# Patient Record
Sex: Female | Born: 1970 | Race: Black or African American | Hispanic: No | Marital: Single | Smoking: Never smoker
Health system: Southern US, Community
[De-identification: ages and names within clinical notes are randomized; demographics above are authoritative.]

## PROBLEM LIST (undated history)

## (undated) DIAGNOSIS — R002 Palpitations: Secondary | ICD-10-CM

## (undated) DIAGNOSIS — D649 Anemia, unspecified: Secondary | ICD-10-CM

## (undated) DIAGNOSIS — E119 Type 2 diabetes mellitus without complications: Secondary | ICD-10-CM

## (undated) DIAGNOSIS — R51 Headache: Secondary | ICD-10-CM

## (undated) DIAGNOSIS — E669 Obesity, unspecified: Secondary | ICD-10-CM

## (undated) DIAGNOSIS — G8929 Other chronic pain: Secondary | ICD-10-CM

## (undated) DIAGNOSIS — F32A Depression, unspecified: Secondary | ICD-10-CM

## (undated) DIAGNOSIS — E010 Iodine-deficiency related diffuse (endemic) goiter: Secondary | ICD-10-CM

## (undated) DIAGNOSIS — F419 Anxiety disorder, unspecified: Secondary | ICD-10-CM

## (undated) DIAGNOSIS — D573 Sickle-cell trait: Secondary | ICD-10-CM

## (undated) DIAGNOSIS — I10 Essential (primary) hypertension: Secondary | ICD-10-CM

## (undated) DIAGNOSIS — M549 Dorsalgia, unspecified: Secondary | ICD-10-CM

## (undated) HISTORY — DX: Depression, unspecified: F32.A

## (undated) HISTORY — DX: Palpitations: R00.2

## (undated) HISTORY — DX: Type 2 diabetes mellitus without complications: E11.9

## (undated) HISTORY — DX: Iodine-deficiency related diffuse (endemic) goiter: E01.0

## (undated) HISTORY — DX: Anxiety disorder, unspecified: F41.9

## (undated) HISTORY — DX: Essential (primary) hypertension: I10

## (undated) HISTORY — DX: Other chronic pain: M54.9

## (undated) HISTORY — DX: Headache: R51

## (undated) HISTORY — DX: Other chronic pain: G89.29

## (undated) HISTORY — DX: Obesity, unspecified: E66.9

## (undated) HISTORY — PX: OTHER SURGICAL HISTORY: SHX169

---

## 2001-08-07 ENCOUNTER — Other Ambulatory Visit: Admission: RE | Admit: 2001-08-07 | Discharge: 2001-08-07 | Payer: Self-pay | Admitting: Obstetrics and Gynecology

## 2002-02-09 ENCOUNTER — Ambulatory Visit (HOSPITAL_COMMUNITY): Admission: AD | Admit: 2002-02-09 | Discharge: 2002-02-09 | Payer: Self-pay | Admitting: Obstetrics and Gynecology

## 2002-04-17 ENCOUNTER — Ambulatory Visit (HOSPITAL_COMMUNITY): Admission: AD | Admit: 2002-04-17 | Discharge: 2002-04-17 | Payer: Self-pay | Admitting: Obstetrics and Gynecology

## 2002-04-21 ENCOUNTER — Ambulatory Visit (HOSPITAL_COMMUNITY): Admission: AD | Admit: 2002-04-21 | Discharge: 2002-04-21 | Payer: Self-pay | Admitting: Obstetrics and Gynecology

## 2002-04-24 ENCOUNTER — Ambulatory Visit (HOSPITAL_COMMUNITY): Admission: AD | Admit: 2002-04-24 | Discharge: 2002-04-24 | Payer: Self-pay | Admitting: Obstetrics and Gynecology

## 2002-04-28 ENCOUNTER — Ambulatory Visit (HOSPITAL_COMMUNITY): Admission: RE | Admit: 2002-04-28 | Discharge: 2002-04-28 | Payer: Self-pay | Admitting: Obstetrics and Gynecology

## 2002-05-01 ENCOUNTER — Ambulatory Visit (HOSPITAL_COMMUNITY): Admission: AD | Admit: 2002-05-01 | Discharge: 2002-05-01 | Payer: Self-pay | Admitting: Obstetrics and Gynecology

## 2002-05-04 ENCOUNTER — Inpatient Hospital Stay (HOSPITAL_COMMUNITY): Admission: AD | Admit: 2002-05-04 | Discharge: 2002-05-08 | Payer: Self-pay | Admitting: Obstetrics and Gynecology

## 2004-11-16 ENCOUNTER — Ambulatory Visit: Payer: Self-pay | Admitting: Family Medicine

## 2004-11-17 ENCOUNTER — Ambulatory Visit (HOSPITAL_COMMUNITY): Admission: RE | Admit: 2004-11-17 | Discharge: 2004-11-17 | Payer: Self-pay | Admitting: Family Medicine

## 2005-01-01 ENCOUNTER — Ambulatory Visit: Payer: Self-pay | Admitting: Family Medicine

## 2005-08-20 ENCOUNTER — Ambulatory Visit: Payer: Self-pay | Admitting: Family Medicine

## 2005-08-20 ENCOUNTER — Encounter (INDEPENDENT_AMBULATORY_CARE_PROVIDER_SITE_OTHER): Payer: Self-pay | Admitting: *Deleted

## 2005-08-27 ENCOUNTER — Ambulatory Visit: Payer: Self-pay | Admitting: Family Medicine

## 2006-08-05 ENCOUNTER — Ambulatory Visit (HOSPITAL_COMMUNITY): Admission: AD | Admit: 2006-08-05 | Discharge: 2006-08-05 | Payer: Self-pay | Admitting: Obstetrics and Gynecology

## 2006-08-14 ENCOUNTER — Inpatient Hospital Stay (HOSPITAL_COMMUNITY): Admission: AD | Admit: 2006-08-14 | Discharge: 2006-08-17 | Payer: Self-pay | Admitting: Obstetrics and Gynecology

## 2006-10-22 LAB — HM MAMMOGRAPHY: HM Mammogram: NORMAL

## 2007-01-20 ENCOUNTER — Ambulatory Visit: Payer: Self-pay | Admitting: Family Medicine

## 2007-01-23 ENCOUNTER — Encounter: Payer: Self-pay | Admitting: Family Medicine

## 2007-07-31 ENCOUNTER — Encounter: Payer: Self-pay | Admitting: Family Medicine

## 2007-12-24 ENCOUNTER — Encounter (INDEPENDENT_AMBULATORY_CARE_PROVIDER_SITE_OTHER): Payer: Self-pay | Admitting: *Deleted

## 2007-12-24 DIAGNOSIS — M549 Dorsalgia, unspecified: Secondary | ICD-10-CM

## 2007-12-24 DIAGNOSIS — E669 Obesity, unspecified: Secondary | ICD-10-CM | POA: Insufficient documentation

## 2007-12-24 DIAGNOSIS — I10 Essential (primary) hypertension: Secondary | ICD-10-CM | POA: Insufficient documentation

## 2007-12-24 HISTORY — DX: Dorsalgia, unspecified: M54.9

## 2010-05-18 ENCOUNTER — Encounter: Payer: Self-pay | Admitting: Family Medicine

## 2010-05-18 ENCOUNTER — Emergency Department (HOSPITAL_COMMUNITY): Admission: EM | Admit: 2010-05-18 | Discharge: 2010-05-18 | Payer: Self-pay | Admitting: Emergency Medicine

## 2010-05-30 DIAGNOSIS — R0789 Other chest pain: Secondary | ICD-10-CM | POA: Insufficient documentation

## 2010-06-01 ENCOUNTER — Ambulatory Visit: Payer: Self-pay | Admitting: Family Medicine

## 2010-06-21 ENCOUNTER — Telehealth: Payer: Self-pay | Admitting: Family Medicine

## 2010-07-06 ENCOUNTER — Telehealth: Payer: Self-pay | Admitting: Family Medicine

## 2010-07-25 ENCOUNTER — Telehealth: Payer: Self-pay | Admitting: Family Medicine

## 2010-07-27 ENCOUNTER — Emergency Department (HOSPITAL_COMMUNITY)
Admission: EM | Admit: 2010-07-27 | Discharge: 2010-07-27 | Payer: Self-pay | Source: Home / Self Care | Admitting: Emergency Medicine

## 2010-07-27 LAB — CONVERTED CEMR LAB
Calcium: 9.3 mg/dL
Chloride: 102 meq/L
Potassium: 3 meq/L
WBC: 6.8 10*3/uL

## 2010-08-04 ENCOUNTER — Ambulatory Visit
Admission: RE | Admit: 2010-08-04 | Discharge: 2010-08-04 | Payer: Self-pay | Source: Home / Self Care | Attending: Cardiology | Admitting: Cardiology

## 2010-08-04 ENCOUNTER — Encounter (INDEPENDENT_AMBULATORY_CARE_PROVIDER_SITE_OTHER): Payer: Self-pay | Admitting: *Deleted

## 2010-08-04 DIAGNOSIS — R002 Palpitations: Secondary | ICD-10-CM | POA: Insufficient documentation

## 2010-08-05 ENCOUNTER — Encounter (INDEPENDENT_AMBULATORY_CARE_PROVIDER_SITE_OTHER): Payer: Self-pay | Admitting: *Deleted

## 2010-08-05 ENCOUNTER — Encounter: Payer: Self-pay | Admitting: Cardiology

## 2010-08-05 LAB — CONVERTED CEMR LAB: TSH: 0.766 microintl units/mL

## 2010-08-07 ENCOUNTER — Encounter: Payer: Self-pay | Admitting: Cardiology

## 2010-08-09 ENCOUNTER — Ambulatory Visit (HOSPITAL_COMMUNITY)
Admission: RE | Admit: 2010-08-09 | Discharge: 2010-08-09 | Payer: Self-pay | Source: Home / Self Care | Attending: Cardiology | Admitting: Cardiology

## 2010-08-15 ENCOUNTER — Ambulatory Visit (HOSPITAL_COMMUNITY)
Admission: RE | Admit: 2010-08-15 | Discharge: 2010-08-15 | Payer: Self-pay | Source: Home / Self Care | Attending: Cardiology | Admitting: Cardiology

## 2010-08-29 DIAGNOSIS — R079 Chest pain, unspecified: Secondary | ICD-10-CM

## 2010-08-29 NOTE — Letter (Signed)
Summary: rpc chart  rpc chart   Imported By: Curtis Sites 02/23/2010 14:43:40  _____________________________________________________________________  External Attachment:    Type:   Image     Comment:   External Document

## 2010-08-29 NOTE — Progress Notes (Signed)
Summary: please advise  Phone Note Call from Patient   Summary of Call: patient called in wanted to know what she could take along with her B/P medication for sinus like symptoms, sore throat drainage in throat, stopped up in nose.  please advise at [phone] Initial call taken by: Curtis Sites,  June 21, 2010 10:35 AM  Follow-up for Phone Call        ocean spray, claritin, or zyrtec , or benadryl, all are oTC Follow-up by: Syliva Overman MD,  June 21, 2010 12:54 PM  Additional Follow-up for Phone Call Additional follow up Details #1::        patient aware Additional Follow-up by: Adella Hare LPN,  June 21, 2010 2:46 PM

## 2010-08-29 NOTE — Letter (Signed)
Summary: urine poc  urine poc   Imported By: Lind Guest 06/01/2010 15:17:04  _____________________________________________________________________  External Attachment:    Type:   Image     Comment:   External Document

## 2010-08-29 NOTE — Assessment & Plan Note (Signed)
Summary: f up from ed   Vital Signs:  Patient profile:   40 year old female Height:      65 inches Weight:      198 pounds BMI:     33.07 O2 Sat:      98 % on Room air Pulse rate:   106 / minute Pulse rhythm:   regular Resp:     16 per minute BP sitting:   152 / 100  (left arm)  Vitals Entered By: Mauricia Area CMA (June 01, 2010 8:19 AM)  Nutrition Counseling: Patient's BMI is greater than 25 and therefore counseled on weight management options.  O2 Flow:  Room air CC: Follow up from ER. Feels like pulled muscle under left shoulder blade.   CC:  Follow up from ER. Feels like pulled muscle under left shoulder blade.Marland Kitchen  History of Present Illness: 2 weeks agp pt was in the eD with chest  tightness, heaviness, sOB radiated down to lUE, had dyspnea for 3 days. 1 yr ago she was dx with herniated disc in lower back, dr Jillyn Hidden was treatiing her with hydorcodone and muscle relaxantas, best relief from epidural in Dec 27, 2008. 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 In the past 2 months she has had inc back pain. was at funeral for acute mI for a cousin , then 1 week lateer another person she knows her age dropped dead. She has been having increased anxiety and stress since then , and at the eD was dx as panic   Preventive Screening-Counseling & Management  Alcohol-Tobacco     Smoking Cessation Counseling: yes  Current Medications (verified): 1)  Tylenol 500 Mg .... 2 Tab As Needed  Allergies (verified): No Known Drug Allergies  Past History:  Past medical, surgical, family and social histories (including risk factors) reviewed, and no changes noted (except as noted below).  Past Medical History: Current Problems:  OBESITY (ICD-278.00) BACK PAIN, CHRONIC (ICD-724.5) with disc ds HYPERTENSION (ICD-401.9) anxiety Dec 27, 2009  Past Surgical History: Reviewed history from 12/24/2007 and no changes required. NONE  Family  History: Reviewed history from 12/24/2007 and no changes required. Mom HTN living at age 27 in 2009/12/27, DM Dad deceased pneumona  in 13 Sisters x 2 healthy Brother x2 healthy uncle died of mI in his 73's   Social History: Reviewed history from 12/24/2007 and no changes required. Employed Single 3 children Never Smoked Alcohol use-no Drug use-no  Review of Systems      See HPI General:  Complains of fatigue and sleep disorder. Eyes:  Denies discharge, eye pain, and red eye. ENT:  Denies hoarseness, nasal congestion, and sinus pressure. Resp:  Denies cough and sputum productive. GI:  Denies abdominal pain, constipation, diarrhea, nausea, and vomiting. GU:  Denies dysuria and urinary frequency. MS:  Complains of low back pain, mid back pain, and stiffness. Derm:  Denies itching and rash. Neuro:  Denies headaches and seizures. Psych:  Complains of anxiety and depression; denies mental problems, suicidal thoughts/plans, thoughts of violence, and unusual visions or sounds; increased anxiety related to job as well as health, hoping for full time employment, economically challenged. Endo:  Denies cold intolerance, excessive hunger, excessive thirst, and heat intolerance. Heme:  Denies abnormal bruising and bleeding. Allergy:  Denies hives or rash and itching eyes.  Physical Exam  General:  Well-developed,overweight,in no acute distress; alert,appropriate and cooperative throughout examination HEENT: No facial asymmetry,  EOMI, No sinus tenderness,  TM's Clear, oropharynx  pink and moist.   Chest: Clear to auscultation bilaterally.No reproducible chest wall tenderness  CVS: S1, S2, No murmurs, No S3.   Abd: Soft, Nontender.  MS: decreased  ROM spine,with spasm,adequate in  hips, shoulders and knees.  Ext: No edema.   CNS: CN 2-12 intact, power tone and sensation normal throughout.   Skin: Intact, no visible lesions or rashes.  Psych: Good eye contact, normal affect.  Memory intact,  anxious but not  depressed appearing.    Impression & Recommendations:  Problem # 1:  OBESITY (ICD-278.00) Assessment Comment Only  Ht: 65 (06/01/2010)   Wt: 198 (06/01/2010)   BMI: 33.07 (06/01/2010) therapeutic lifestyle change discussed and encouraged  Problem # 2:  BACK PAIN, CHRONIC (ICD-724.5) Assessment: Deteriorated  Her updated medication list for this problem includes:    Vicodin 5-500 Mg Tabs (Hydrocodone-acetaminophen) .Marland Kitchen... Take 1 tab by mouth at bedtime as needed for severe pain  Problem # 3:  HYPERTENSION (ICD-401.9) Assessment: Comment Only  Her updated medication list for this problem includes:    Maxzide-25 37.5-25 Mg Tabs (Triamterene-hctz) .Marland Kitchen... Take 1 tablet by mouth once a day Patient advised to follow low sodium diet rich in fruit and vegetables, and to commit to at least 30 minutes 5 days per week of regular exercise , to improve blood presure control.   BP today: 152/100  Complete Medication List: 1)  Tylenol 500 Mg  .... 2 tab as needed 2)  Diazepam 5 Mg Tabs (Diazepam) .... Take 1 tab by mouth at bedtime as needed for muscle spasm and anxiety 3)  Vicodin 5-500 Mg Tabs (Hydrocodone-acetaminophen) .... Take 1 tab by mouth at bedtime as needed for severe pain 4)  Implanon 68 Mg Impl (Etonogestrel) .... Inserteed 08/2009, duration of use 3 years 5)  Maxzide-25 37.5-25 Mg Tabs (Triamterene-hctz) .... Take 1 tablet by mouth once a day  Patient Instructions: 1)  Please schedule a follow-up appointment in 4 months. 2)  You need to lose weight. Consider a lower calorie diet and regular exercise. pls follow the dASH  diet, 3)  It is important that you exercise regularly at least 20 minutes 5 times a week. If you develop chest pain, have severe difficulty breathing, or feel very tired , stop exercising immediately and seek medical attention. 4)  pls follow the dASH diet. 5)  meds as discussed. 6)  your bP is high need to start med 7)  try to get mamo through  the [facility] Prescriptions: MAXZIDE-25 37.5-25 MG TABS (TRIAMTERENE-HCTZ) Take 1 tablet by mouth once a day  #90 x 1   Entered and Authorized by:   Syliva Overman MD   Signed by:   Syliva Overman MD on 06/01/2010   Method used:   Electronically to        [facility] (retail)       [address]       [place], [place]  [address]       Ph: [phone]       Fax: [phone]   RxID:   [id number] VICODIN 5-500 MG TABS (HYDROCODONE-ACETAMINOPHEN) Take 1 tab by mouth at bedtime as needed for severe pain  #30 x 1   Entered and Authorized by:   Syliva Overman MD   Signed by:   Syliva Overman MD on 06/01/2010   Method used:   Printed then faxed to .Marland KitchenMarland Kitchen  [facility] [facility] (retail)       [address]       [place], [place]  [address]       Ph: [phone]       Fax: [phone]   RxID:   [id number] DIAZEPAM 5 MG TABS (DIAZEPAM) Take 1 tab by mouth at bedtime as needed for muscle spasm and anxiety  #30 x 4   Entered and Authorized by:   Syliva Overman MD   Signed by:   Syliva Overman MD on 06/01/2010   Method used:   Printed then faxed to ...       [facility] (retail)       [address]       [place], [place]  [address]       Ph: [phone]       Fax: [phone]   RxID:   [id number]    Orders Added: 1)  New Patient Level III [id number]

## 2010-08-29 NOTE — Letter (Signed)
Summary: [facility]ERIVATIVES  FIBRIN DERIVATIVES   Imported By: Lind Guest 06/01/2010 15:23:58  _____________________________________________________________________  External Attachment:    Type:   Image     Comment:   External Document

## 2010-08-29 NOTE — Progress Notes (Signed)
Summary: SINUS PROBLEMS  Phone Note Call from Patient   Summary of Call: PATIENT CALLED STATES THAT SHE WAS JUST IN THE OFFICE.Marland Kitchen NOT FEELING ANY BETTER STILL HAVING SINUS PROBLEM WANTED TO KNOW IF YOU COULD CALL SOMETHING IN FOR HER   CELL [phone] IF ITS AFTER 10AM CALL WRK [phone] Initial call taken by: Eugenio Hoes,  July 06, 2010 8:53 AM  Follow-up for Phone Call        drainage, dark mucous, no fever stuffy head and pressure, tried benadryl and saline rinse Follow-up by: Adella Hare LPN,  July 06, 2010 12:57 PM  Additional Follow-up for Phone Call Additional follow up Details #1::        pls advise labs are sentin Additional Follow-up by: Syliva Overman MD,  July 07, 2010 6:33 AM    Additional Follow-up for Phone Call Additional follow up Details #2::    left message that medicine is called in Follow-up by: Lind Guest,  July 07, 2010 7:53 AM  New/Updated Medications: PENICILLIN V POTASSIUM 500 MG TABS (PENICILLIN V POTASSIUM) Take 1 tablet by mouth three times a day TESSALON PERLES 100 MG CAPS (BENZONATATE) Take 1 capsule by mouth three times a day Prescriptions: TESSALON PERLES 100 MG CAPS (BENZONATATE) Take 1 capsule by mouth three times a day  #21 x 0   Entered and Authorized by:   Syliva Overman MD   Signed by:   Syliva Overman MD on 07/07/2010   Method used:   Electronically to        [facility])       [address]       [place], [place]  [address]       Ph: [phone]       Fax: [phone]   RxID:   [id number] PENICILLIN V POTASSIUM 500 MG TABS (PENICILLIN V POTASSIUM) Take 1 tablet by mouth three times a day  #30 x 0   Entered and Authorized by:   Syliva Overman MD   Signed by:   Syliva Overman MD on 07/07/2010   Method used:   Electronically to        [facility]  [address] [address] [facility] (retail)       [address]       [place], [place]  [address]       Ph: [phone]  Fax: [phone]   RxID:   [id number]

## 2010-08-30 ENCOUNTER — Encounter (INDEPENDENT_AMBULATORY_CARE_PROVIDER_SITE_OTHER): Payer: Self-pay | Admitting: *Deleted

## 2010-08-31 NOTE — Assessment & Plan Note (Signed)
Summary: per Dr.Simpson to eval chest pain and dyspnea/tg   Visit Type:  Initial Consult Primary Provider:  Dr. Syliva Overman   History of Present Illness: [name] was evaluated today in the office at the kind request of Dr. Lodema Hong for chest and back discomfort, palpitations and dyspnea.  Patient developed these symptoms a few months ago and was seen in the emergency department without a specific diagnosis being established.  She has had continuing exercise intolerance with dyspnea on mild exertion but no symptoms at rest.  She describes left shoulder and interscapular pain with some chest discomfort.  She notes a rapid heartbeat on occasion of fairly brief duration.  She has had no orthopnea nor PND.  She has noted no pedal edema.  She has no history of significant GI problems.  EKG  Procedure date:  08/04/2010  Findings:      Normal sinus rhythm at a rate of 99 Right atrial enlargement; borderline left atrial abnormality. Possible prior inferior myocardial infarction Minor nonspecific ST-T wave abnormality No change when compared to a previous tracing of 07/27/10  -  Date:  07/27/2010    BG Random: 98    BUN: 8    Creatinine: 0.92    Sodium: 137    Potassium: 3.0    Chloride: 102    CO2 Total: 24    Calcium: 9.3    WBC: 6.8    HGB: 11.5    HCT: 35    PLT: 408    MCV: 85    BNP: <30   Current Medications (verified): 1)  Tylenol 500 Mg .... 2 Tab As Needed 2)  Diazepam 5 Mg Tabs (Diazepam) .... Take 1 Tab By Mouth At Bedtime As Needed For Muscle Spasm and Anxiety 3)  Implanon 68 Mg Impl (Etonogestrel) .... Inserteed 08/2009, Duration of Use 3 Years 4)  Maxzide-25 37.5-25 Mg Tabs (Triamterene-Hctz) .... Take 1 Tablet By Mouth Once A Day 5)  Acyclovir 400 Mg Tabs (Acyclovir) .... 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 Tobacco-none Alcohol use-no Drug use-no  Past Medical History: Chest discomfort: Emergency Department evaluation in 05/2010 Hypertension OBESITY (ICD-278.00) BACK PAIN, CHRONIC (ICD-724.5) with disc disease Anxiety  Family History: Mother: age 71 in 2011, h/o diabetes and hypertension Father: deceased pneumona  age 50 Siblings: Sisters x 2 healthy; one sister deceased as a result of leukemia; Brother x2 healthy Uncle died due to myocardial infarction in his 66's   Social History: Employed in a medical office part-time Single with 2 children Tobacco-none Alcohol use-no Drug use-no  Review of Systems       patient notes occasional mild dizziness; requires corrective lenses.  All other systems reviewed and are negative.  Vital Signs:  Patient profile:   40 year old female Height:      65 inches Weight:      184 pounds BMI:     30.73 O2 Sat:      98 % on Room air Pulse  rate:   108 / minute BP sitting:   146 / 88  (left arm)  Vitals Entered By: Dreama Saa, CNA (August 04, 2010 1:06 PM)  O2 Flow:  Room air  Physical Exam  General:  Overweight; well-developed; no acute distress: HEENT-[place]/AT; PERRL; EOM intact; conjunctiva and lids nl:  Neck-No JVD; no carotid bruits: Endocrine-No thyromegaly: Lungs-No tachypnea, clear without rales, rhonchi or wheezes: CV-normal PMI; normal S1 and S2:;  Abdomen-BS normal; soft and non-tender without masses or organomegaly: MS-No deformities, cyanosis or clubbing: Neurologic-Nl cranial nerves; symmetric  strength and tone: Skin- Warm, no sig. lesions: Extremities-Nl distal pulses; no edema    Impression & Recommendations:  Problem # 1:  CHEST PAIN (ICD-786.50) Mary Roman has a variety of symptoms, some of which could certainly be cardiac-related.  At age 42 with premenopausal status and little in the way of cardiovascular risk factors, the likelihood of coronary disease is remote.  We will perform a stress test with echocardiographic imaging, primarily to evaluate her exercise tolerance and to further assess exertional dyspnea.  Problem # 2:  PALPITATIONS (ICD-785.1) Events are infrequent enough so that a Holter recording would likely not be productive.  We will provide her with an event recorder for 3 weeks and check a TSH.  I will reassess her status in one month at a return office visit.  Other Orders: T-TSH [id number]) Cardionet/Event Monitor (Cardionet/Event) 2-D Echocardiogram (2D Echo) Stress Echo (Stress Echo)  Patient Instructions: 1)  Your physician recommends that you schedule a follow-up appointment in: 5 WEEKS 2)  Your physician recommends that you return for lab work in: TODAY 3)  Your physician has requested that you have a stress echocardiogram. For further information please visit [email or web address]  Please follow instruction sheet as given. 4)  Your physician has requested that you have an echocardiogram.  Echocardiography is a painless test that uses sound waves to create images of your heart. It provides your doctor with information about the size and shape of your heart and how well your heart's chambers and valves are working.  This procedure takes approximately one hour. There are no restrictions for this procedure. 5)  Your physician has recommended that you wear an event monitor.  Event monitors are medical devices that record the heart's electrical activity. Doctors most often use these monitors to diagnose arrhythmias. Arrhythmias are problems with the  speed or rhythm of the heartbeat. The monitor is a small, portable device. You can wear one while you do your normal daily activities. This is usually used to diagnose what is causing palpitations/syncope (passing out).

## 2010-08-31 NOTE — Letter (Signed)
Summary: Stress Echocardiogram Information Sheet  [facility]  [address], [place] [address]   Phone: [phone]  Fax: [phone]      August 04, 2010 MRN: [id number] light prior to the test.   Jennye Moccasin  Doctor: Appointment Date: Appointment Time: Appointment Location: [facility]  Stress Echocardiogram Information Sheet    Instructions:   1. DO NOT  take your [facility] medicine MORNING before the test.  2. Eat light prior to the test. NO CAFFIENE  3. Dress prepared to exercise.  4. DO NOT use ANY caffine or tobacco products 3 hours before appointment.  5. Report to the Short Stay Center on the1st floor.  6. Please bring all current prescription medications.  7. If you have any questions, please call [phone]

## 2010-08-31 NOTE — Progress Notes (Signed)
Summary: NEED REFERRAL  Phone Note Call from Patient   Summary of Call: PATIENT CALLED STATES THAT SHE NEED A REFERRAL TO BE SEEN BY THE CARDIOLOGY DOCTOR  IF NEED TO BE REACH PLEASE CALL @ [phone] Initial call taken by: Eugenio Hoes,  July 25, 2010 8:49 AM  Follow-up for Phone Call        pls document her symptoms so I can refer, i am happy to do this once I know Follow-up by: Syliva Overman MD,  July 25, 2010 1:45 PM  Additional Follow-up for Phone Call Additional follow up Details #1::        shortness of breath and some tightness in chest Additional Follow-up by: Adella Hare LPN,  July 25, 2010 4:47 PM  New Problems: DYSPNEA (ICD-786.05) CHEST PAIN UNSPECIFIED (ICD-786.50)   Additional Follow-up for Phone Call Additional follow up Details #2::    pls refer pt for card eval; with [place] , eval SOB  and chest tightness, I will put in referral log, pls klet her know Follow-up by: Syliva Overman MD,  July 25, 2010 5:09 PM  Additional Follow-up for Phone Call Additional follow up Details #3:: Details for Additional Follow-up Action Taken: appt. 1.6.2012 @ 1:15 patient is aware Additional Follow-up by: Lind Guest,  July 26, 2010 8:38 AM  New Problems: DYSPNEA (ICD-786.05) CHEST PAIN UNSPECIFIED (ICD-786.50)

## 2010-09-04 ENCOUNTER — Encounter: Payer: Self-pay | Admitting: Cardiology

## 2010-09-04 ENCOUNTER — Encounter (INDEPENDENT_AMBULATORY_CARE_PROVIDER_SITE_OTHER): Payer: Self-pay | Admitting: *Deleted

## 2010-09-04 ENCOUNTER — Ambulatory Visit (INDEPENDENT_AMBULATORY_CARE_PROVIDER_SITE_OTHER): Payer: Self-pay | Admitting: Cardiology

## 2010-09-04 DIAGNOSIS — R079 Chest pain, unspecified: Secondary | ICD-10-CM

## 2010-09-04 DIAGNOSIS — R002 Palpitations: Secondary | ICD-10-CM

## 2010-09-06 ENCOUNTER — Encounter: Payer: Self-pay | Admitting: Cardiology

## 2010-09-06 NOTE — Letter (Signed)
Summary: [place] [facility], [facility] at [facility]  [address] [place] [address]   Phone: [phone]  Fax: [phone]      August 30, 2010 MRN: [id number]   [facility] [address] [place], [place]  [address]   Dear Ms. [facility],  Your test ordered by Selena Batten has been reviewed by your physician (or physician assistant) and was found to be normal or stable. Your physician (or physician assistant) felt no changes were needed at this time.  _X___ Echocardiogram  _X___ Cardiac Stress Test  ____ Lab Work  ____ Peripheral vascular study of arms, legs or neck  ____ CT scan or X-ray  ____ Lung or Breathing test  ____ Other:  No change in medical treatment at this time, per Dr.Rothbart.  Thank you, Tammara Massing Allyne Gee RN    [place] Bing, MD, Lenise Arena.C.Gaylord Shih, MD, F.A.C.C Lewayne Bunting, MD, F.A.C.C Nona Dell, MD, F.A.C.C Charlton Haws, MD, Lenise Arena.C.C

## 2010-09-06 NOTE — Miscellaneous (Signed)
Summary: labs tsh 08/05/2010  Clinical Lists Changes  Observations: Added new observation of TSH: 0.766 microintl units/mL (08/05/2010 15:46)

## 2010-09-13 ENCOUNTER — Other Ambulatory Visit: Payer: Self-pay | Admitting: Family Medicine

## 2010-09-13 ENCOUNTER — Encounter: Payer: Self-pay | Admitting: Family Medicine

## 2010-09-13 ENCOUNTER — Ambulatory Visit (INDEPENDENT_AMBULATORY_CARE_PROVIDER_SITE_OTHER): Payer: Self-pay | Admitting: Family Medicine

## 2010-09-13 DIAGNOSIS — R51 Headache: Secondary | ICD-10-CM | POA: Insufficient documentation

## 2010-09-13 DIAGNOSIS — Z139 Encounter for screening, unspecified: Secondary | ICD-10-CM

## 2010-09-13 DIAGNOSIS — R519 Headache, unspecified: Secondary | ICD-10-CM | POA: Insufficient documentation

## 2010-09-13 DIAGNOSIS — R279 Unspecified lack of coordination: Secondary | ICD-10-CM | POA: Insufficient documentation

## 2010-09-13 DIAGNOSIS — F411 Generalized anxiety disorder: Secondary | ICD-10-CM

## 2010-09-14 NOTE — Assessment & Plan Note (Signed)
Summary: 5 week f/u per check out on 08/04/10/tg/lv   Visit Type:  5 week follow up Primary Provider:  Dr. Syliva Overman   History of Present Illness: [name] returns to the office for continued assessment and treatment of palpitations, and dizziness and dyspnea.  Over the past month, symptoms have improved, which the patient attributes to treatment with diazepam.  She generally takes 5 mg q.h.s. and 2.5 mg during the day.  She has continued to note some brief episodes of dizziness and dyspnea as well as palpitations.  An event recorder, carried for the past week, has demonstrated mostly sinus tachycardia, sinus bradycardia and normal sinus rhythm.  Reports of palpitations are reliably associated with PVCs or sinus tachycardia.  During her other symptoms,  normal sinus rhythm has generally been present.    Current Medications (verified): 1)  Tylenol 500 Mg .... 2 Tab As Needed 2)  Diazepam 5 Mg Tabs (Diazepam) .... Take 1 Tab By Mouth At Bedtime As Needed For Muscle Spasm and Anxiety 3)  Implanon 68 Mg Impl (Etonogestrel) .... Inserteed 08/2009, Duration of Use 3 Years 4)  Maxzide-25 37.5-25 Mg Tabs (Triamterene-Hctz) .... Take 1 Tablet By Mouth Once A Day 5)  Acyclovir 400 Mg Tabs (Acyclovir) .... Take As Needed 6)  Klor-Con M20 20 Meq Cr-Tabs (Potassium Chloride Crys Cr) .... Take 1 Tablet By Mouth Once A Day  Allergies (verified): No Known Drug Allergies  Past History:  PMH, FH, and Social History reviewed and updated.  Past Medical History: Chest discomfort: Emergency Department evaluation in 05/2010 Hypertension OBESITY (ICD-278.00) BACK PAIN, CHRONIC (ICD-724.5) with disc disease Anxiety Progesterone implant for contraception  Review of Systems       Patient is noted metromenorrhagia at times with complete suppression of menses at others; this was not a pattern with her previous 2 contraceptive implants.  Vital Signs:  Patient profile:   40 year old  female Height:      65 inches Weight:      181 pounds O2 Sat:      94 % on Room air Pulse rate:   121 / minute Pulse (ortho):   108 / minute BP sitting:   133 / 92  (left arm) BP standing:   137 / 90  Vitals Entered By: Dreama Saa, CNA (September 04, 2010 3:00 PM)  O2 Flow:  Room air  Serial Vital Signs/Assessments:  Time      Position  BP       Pulse  Resp  Temp     By 3:45 PM   Lying LA  142/94   98                    Tammy Sanders RN 3:45 PM   Sitting   133/88   103                   Tammy Sanders RN 3:45 PM   Standing  137/90   108                   Tammy Sanders RN  Comments: 3:45 PM pt c/o lightheadedness upon standing/LV By: Teressa Lower RN    Physical Exam  General:  Overweight; well-developed; no acute distress: Neck-No JVD; no carotid bruits: Lungs-No tachypnea, clear without rales, rhonchi or wheezes: CV-normal PMI; normal S1 and S2:;  Abdomen-BS normal; soft and non-tender without masses or organomegaly: Neurologic-Nl cranial nerves; symmetric strength and tone: Skin- Warm, no  sig. lesions: Extremities-Nl distal pulses; no edema    Impression & Recommendations:  Problem # 1:  PALPITATIONS (ICD-785.1) The symptoms are related to PVCs.  The patient does not appear to be excessively concerned about potential adverse consequences nor does she appear to require pharmacologic therapy.  She has been advised of the variable nature of this minor arrhythmia and the possibility that she might want treatment in the future.  She will call if this proves to be the case.  Problem # 2:  CHEST PAIN (ICD-786.50) Chest discomfort has resolved for now.  She is at low risk for coronary disease, and her stress echocardiogram was negative.  No further evaluation or treatment is warranted at present.  Problem # 3:  OBESITY (ICD-278.00) Brogan reports a 13 pound weight gain in recent months.  She is congratulated and encouraged to continue her efforts.  Problem # 4:   HYPERTENSION (ICD-401.9) Blood pressure control is acceptable.  Hypokalemia was present at the time of her emergency department evaluation.  I've asked her to start potassium chloride 20 mEq q.d. and to repeat a chemistry profile in one month.  Since I have no report of cholesterol levels, a lipid profile will be obtained as well.  I will be happy to reassess this nice woman at any time Dr. Lodema Hong determines that I can be of assistance in her medical care.  Other Orders: Future Orders: T-Comprehensive Metabolic Panel ([id number]) ... 10/03/2010 T-Lipid Profile [phone]) ... 10/03/2010  Patient Instructions: 1)  Your physician recommends that you schedule a follow-up appointment in: AS NEEDED 2)  Your physician recommends that you return for lab work in: NEXT MONTH 3)  Your physician has recommended you make the following change in your medication: POTASSIUM DAILY Prescriptions: KLOR-CON M20 20 MEQ CR-TABS (POTASSIUM CHLORIDE CRYS CR) Take 1 tablet by mouth once a day  #30 x 3   Entered by:   Teressa Lower RN   Authorized by:   Kathlen Brunswick, MD, [facility]   Signed by:   Teressa Lower RN on 09/04/2010   Method used:   Electronically to        [facility]  [address] (retail)       [address]       [place], [place]  [address]       Ph: [phone]       Fax: [phone]   RxID:   [id number]

## 2010-09-14 NOTE — Letter (Signed)
Summary: [place] [facility], agricultural at [employer]  [address] [place] [address]   Phone: [phone]  Fax: [phone]     September 04, 2010 MRN: [id number]   [facility] [address] [place], [place]  [address]      YOUR LAB WORK IS DUE   October 03, 2010  Please go to [facility], located across the street from [facility] on the second floor.  Hours are Monday - Friday 7am until 7:30pm         Saturday 8am until 12noon    _X_  DO NOT EAT OR DRINK AFTER MIDNIGHT EVENING PRIOR TO LABWORK

## 2010-09-14 NOTE — Procedures (Signed)
Summary: [facility]  [facility]   Imported By: Faythe Ghee 09/06/2010 14:38:24  _____________________________________________________________________  External Attachment:    Type:   Image     Comment:   External Document

## 2010-09-18 ENCOUNTER — Ambulatory Visit (HOSPITAL_COMMUNITY)
Admission: RE | Admit: 2010-09-18 | Discharge: 2010-09-18 | Disposition: A | Discharge status: Home / Self Care | Payer: Self-pay | Source: Ambulatory Visit | Attending: Family Medicine | Admitting: Family Medicine

## 2010-09-18 ENCOUNTER — Encounter: Payer: Self-pay | Admitting: Cardiology

## 2010-09-18 DIAGNOSIS — Z1231 Encounter for screening mammogram for malignant neoplasm of breast: Secondary | ICD-10-CM | POA: Insufficient documentation

## 2010-09-18 DIAGNOSIS — Z139 Encounter for screening, unspecified: Secondary | ICD-10-CM

## 2010-09-19 ENCOUNTER — Other Ambulatory Visit: Payer: Self-pay | Admitting: Family Medicine

## 2010-09-19 ENCOUNTER — Telehealth: Payer: Self-pay | Admitting: Family Medicine

## 2010-09-19 DIAGNOSIS — R51 Headache: Secondary | ICD-10-CM

## 2010-09-20 ENCOUNTER — Other Ambulatory Visit: Payer: Self-pay | Admitting: Family Medicine

## 2010-09-20 ENCOUNTER — Telehealth (INDEPENDENT_AMBULATORY_CARE_PROVIDER_SITE_OTHER): Payer: Self-pay | Admitting: *Deleted

## 2010-09-20 ENCOUNTER — Ambulatory Visit (HOSPITAL_COMMUNITY)
Admission: RE | Admit: 2010-09-20 | Discharge: 2010-09-20 | Disposition: A | Discharge status: Home / Self Care | Payer: Self-pay | Source: Ambulatory Visit | Attending: Family Medicine | Admitting: Family Medicine

## 2010-09-20 DIAGNOSIS — R209 Unspecified disturbances of skin sensation: Secondary | ICD-10-CM | POA: Insufficient documentation

## 2010-09-20 DIAGNOSIS — R928 Other abnormal and inconclusive findings on diagnostic imaging of breast: Secondary | ICD-10-CM

## 2010-09-20 DIAGNOSIS — R42 Dizziness and giddiness: Secondary | ICD-10-CM | POA: Insufficient documentation

## 2010-09-20 DIAGNOSIS — H538 Other visual disturbances: Secondary | ICD-10-CM | POA: Insufficient documentation

## 2010-09-20 DIAGNOSIS — R51 Headache: Secondary | ICD-10-CM

## 2010-09-21 ENCOUNTER — Other Ambulatory Visit (HOSPITAL_COMMUNITY): Payer: Self-pay

## 2010-09-26 NOTE — Progress Notes (Signed)
Summary: more imagine  Phone Note Call from Patient   Summary of Call: pt has appt for more imaging at [facility] for 10/04/2010.  Initial call taken by: Rudene Anda,  September 20, 2010 2:12 PM

## 2010-09-26 NOTE — Assessment & Plan Note (Signed)
Summary: HEADACHES and blurred vision   Vital Signs:  Patient profile:   40 year old female Height:      65 inches Weight:      180.25 pounds BMI:     30.10 O2 Sat:      98 % Pulse rate:   112 / minute Pulse rhythm:   regular Resp:     16 per minute BP sitting:   122 / 90  (left arm) Cuff size:   large  Vitals Entered By: Everitt Amber LPN (September 13, 2010 2:37 PM)  Nutrition Counseling: Patient's BMI is greater than 25 and therefore counseled on weight management options. CC: c/o lightheadedness, blurred vision, off balance, feeling of pins and needles throughout head x 2 weeks    Primary Care Provider:  Dr. Syliva Overman  CC:  c/o lightheadedness, blurred vision, off balance, and feeling of pins and needles throughout head x 2 weeks .  History of Present Illness: 2 week h/o tingling in the forehead shooting rto the occiput, also blurred vision and unsteady gait She was recently evaluated and cleared by cardiology, she does agree that she suffers from increased anxiety, and is interested in medication to alleviate her symptoms. she is commited to regular execise, and is trying to eat healthily to facilitate weight loss.  Preventive Screening-Counseling & Management  Alcohol-Tobacco     Smoking Status: never  Current Medications (verified): 1)  Tylenol 500 Mg .... 2 Tab As Needed 2)  Diazepam 5 Mg Tabs (Diazepam) .... Take 1 Tab By Mouth At Bedtime As Needed For Muscle Spasm and Anxiety 3)  Implanon 68 Mg Impl (Etonogestrel) .... Inserteed 08/2009, Duration of Use 3 Years 4)  Maxzide-25 37.5-25 Mg Tabs (Triamterene-Hctz) .... Take 1 Tablet By Mouth Once A Day 5)  Acyclovir 400 Mg Tabs (Acyclovir) .... Take As Needed 6)  Klor-Con M20 20 Meq Cr-Tabs (Potassium Chloride Crys Cr) .... Take 1 Tablet By Mouth Once A Day  Allergies (verified): No Known Drug Allergies  Review of Systems      See HPI General:  Complains of fatigue. Eyes:  Complains of blurring and vision  loss-both eyes. ENT:  Denies hoarseness, nasal congestion, sinus pressure, and sore throat. CV:  Denies chest pain or discomfort and swelling of feet. Resp:  Denies cough and sputum productive. GI:  Denies abdominal pain, constipation, diarrhea, nausea, and vomiting blood. GU:  Denies dysuria and urinary frequency. MS:  Complains of low back pain and mid back pain; denies joint pain and stiffness; uncontrolled back p[ain, anxious to get ins so she can get epidurals which have helped in the p[ast. Neuro:  Complains of disturbances in coordination, headaches, and tingling. Psych:  Complains of anxiety and panic attacks; denies depression. Endo:  Denies cold intolerance, excessive hunger, excessive thirst, and heat intolerance. Heme:  Denies abnormal bruising, bleeding, and enlarge lymph nodes. Allergy:  Complains of seasonal allergies.  Physical Exam  General:  Well-developed,well-nourished,in no acute distress; alert,appropriate and cooperative throughout examination HEENT: No facial asymmetry,  EOMI, No sinus tenderness, TM's Clear, oropharynx  pink and moist.   Chest: Clear to auscultation bilaterally.  CVS: S1, S2, No murmurs, No S3.   Abd: Soft, Nontender.  MS: Adequate ROM spine, hips, shoulders and knees.  Ext: No edema.   CNS: CN 2-12 intact, power tone and sensation normal throughout.   Skin: Intact, no visible lesions or rashes.  Psych: Good eye contact, normal affect.  Memory intact, not anxious or depressed appearing.  Impression & Recommendations:  Problem # 1:  GENERALIZED ANXIETY DISORDER (ICD-300.02) Assessment Comment Only  The following medications were removed from the medication list:    Diazepam 5 Mg Tabs (Diazepam) .Marland Kitchen... Take 1 tab by mouth at bedtime as needed for muscle spasm and anxiety Her updated medication list for this problem includes:    Buspirone Hcl 5 Mg Tabs (Buspirone hcl) .Marland Kitchen... Take 1 tablet by mouth two times a day  Problem # 2:  ATAXIA  (ICD-781.3) Assessment: Comment Only  Orders: Radiology Referral (Radiology)  Problem # 3:  HEADACHE (ICD-784.0) Assessment: Deteriorated  Her updated medication list for this problem includes:    Hydrocodone-acetaminophen 5-500 Mg Tabs (Hydrocodone-acetaminophen) .Marland Kitchen... Take 1 tab by mouth at bedtime as needed for backache  Orders: Radiology Referral (Radiology)  Problem # 4:  HYPERTENSION (ICD-401.9) Assessment: Improved  Her updated medication list for this problem includes:    Maxzide-25 37.5-25 Mg Tabs (Triamterene-hctz) .Marland Kitchen... Take 1 tablet by mouth once a day  BP today: 122/90 Prior BP: 137/90 (09/04/2010)  Labs Reviewed: K+: 3.0 (07/27/2010) Creat: : 0.92 (07/27/2010)     Problem # 5:  BACK PAIN, CHRONIC (ICD-724.5) Assessment: Deteriorated  Her updated medication list for this problem includes:    Hydrocodone-acetaminophen 5-500 Mg Tabs (Hydrocodone-acetaminophen) .Marland Kitchen... Take 1 tab by mouth at bedtime as needed for backache  Complete Medication List: 1)  Tylenol 500 Mg  .... 2 tab as needed 2)  Implanon 68 Mg Impl (Etonogestrel) .... Inserteed 08/2009, duration of use 3 years 3)  Maxzide-25 37.5-25 Mg Tabs (Triamterene-hctz) .... Take 1 tablet by mouth once a day 4)  Acyclovir 400 Mg Tabs (Acyclovir) .... Take as needed 5)  Klor-con M20 20 Meq Cr-tabs (Potassium chloride crys cr) .... Take 1 tablet by mouth once a day 6)  Hydrocodone-acetaminophen 5-500 Mg Tabs (Hydrocodone-acetaminophen) .... Take 1 tab by mouth at bedtime as needed for backache 7)  Buspirone Hcl 5 Mg Tabs (Buspirone hcl) .... Take 1 tablet by mouth two times a day  Patient Instructions: 1)  f/u in 5 weeks. 2)  Meds are prescribed for anxiety and back pain. 3)  stop diazepam 4)  It is important that you exercise regularly at least 20 minutes 5 times a week. If you develop chest pain, have severe difficulty breathing, or feel very tired , stop exercising immediately and seek medical attention. 5)   You need to lose weight. Consider a lower calorie diet and regular exercise.  6)  pls make appt for eye exam 7)  you are referred for a mamogram and brain scan Prescriptions: BUSPIRONE HCL 5 MG TABS (BUSPIRONE HCL) Take 1 tablet by mouth two times a day  #60 x 2   Entered and Authorized by:   Syliva Overman MD   Signed by:   Syliva Overman MD on 09/13/2010   Method used:   Electronically to        [facility]  [address] (retail)       [address]       [place], [place]  [address]       Ph: [phone]       Fax: [phone]   RxID:   [id number] HYDROCODONE-ACETAMINOPHEN 5-500 MG TABS (HYDROCODONE-ACETAMINOPHEN) Take 1 tab by mouth at bedtime as needed for backache  #30 x 1   Entered and Authorized by:   Syliva Overman MD   Signed by:   Syliva Overman MD on 09/13/2010  Method used:   Printed then faxed to ...       [facility]  [address] (retail)       [address]       [place], [place]  [address]       Ph: [phone]       Fax: [phone]   RxID:   [id number]    Orders Added: 1)  Est. Patient Level IV [[address]] 2)  Radiology Referral [Radiology] 3)  Radiology Referral [Radiology]

## 2010-09-26 NOTE — Progress Notes (Signed)
Summary: dizziness  Phone Note Call from Patient   Summary of Call: pt needs to speak with nurse about being dizzy and lightheadness. Medicine causing the dizziness. [phone] Initial call taken by: Rudene Anda,  September 19, 2010 11:51 AM  Follow-up for Phone Call        She took Buspar for about 5 days but it made her so dizzy and she didn't feel comfortable taking it at work. She stopped it and wants to know if you want to change it to something else   [facility] Follow-up by: Everitt Amber LPN,  September 20, 2010 9:36 AM  Additional Follow-up for Phone Call Additional follow up Details #1::        discussed with pt will start half once daily, then inc to half twice daily as tolerated, she agrees Additional Follow-up by: Syliva Overman MD,  September 20, 2010 12:35 PM

## 2010-09-26 NOTE — Procedures (Signed)
Summary: [facility]  [facility]   Imported By: Faythe Ghee 09/18/2010 13:06:21  _____________________________________________________________________  External Attachment:    Type:   Image     Comment:   External Document

## 2010-09-28 ENCOUNTER — Ambulatory Visit: Payer: Self-pay | Admitting: Family Medicine

## 2010-10-02 ENCOUNTER — Encounter: Payer: Self-pay | Admitting: Family Medicine

## 2010-10-04 ENCOUNTER — Ambulatory Visit (HOSPITAL_COMMUNITY)
Admission: RE | Admit: 2010-10-04 | Discharge: 2010-10-04 | Disposition: A | Discharge status: Home / Self Care | Payer: Self-pay | Source: Ambulatory Visit | Attending: Family Medicine | Admitting: Family Medicine

## 2010-10-04 ENCOUNTER — Encounter: Payer: Self-pay | Admitting: Family Medicine

## 2010-10-04 ENCOUNTER — Ambulatory Visit (HOSPITAL_COMMUNITY)
Admission: RE | Admit: 2010-10-04 | Discharge: 2010-10-04 | Disposition: A | Discharge status: Home / Self Care | Payer: 59 | Source: Ambulatory Visit | Attending: Family Medicine | Admitting: Family Medicine

## 2010-10-04 DIAGNOSIS — R928 Other abnormal and inconclusive findings on diagnostic imaging of breast: Secondary | ICD-10-CM | POA: Insufficient documentation

## 2010-10-09 LAB — CBC
HCT: 35 % — ABNORMAL LOW (ref 36.0–46.0)
Hemoglobin: 11.5 g/dL — ABNORMAL LOW (ref 12.0–15.0)
RDW: 12.9 % (ref 11.5–15.5)
WBC: 6.8 10*3/uL (ref 4.0–10.5)

## 2010-10-09 LAB — BASIC METABOLIC PANEL
BUN: 8 mg/dL (ref 6–23)
Calcium: 9.3 mg/dL (ref 8.4–10.5)
GFR calc non Af Amer: >60 mL/min (ref >60)
Potassium: 3 mEq/L — ABNORMAL LOW (ref 3.5–5.1)
Sodium: 137 mEq/L (ref 135–145)

## 2010-10-09 LAB — DIFFERENTIAL
Basophils Absolute: 0 10*3/uL (ref 0.0–0.1)
Lymphocytes Relative: 35 % (ref 12–46)
Monocytes Absolute: 0.5 10*3/uL (ref 0.1–1.0)
Neutro Abs: 3.9 10*3/uL (ref 1.7–7.7)
Neutrophils Relative %: 57 % (ref 43–77)

## 2010-10-09 LAB — POCT CARDIAC MARKERS
CKMB, poc: 1.3 ng/mL (ref 1.0–8.0)
Myoglobin, poc: 119 ng/mL (ref 12–200)
Troponin i, poc: <0.05 ng/mL (ref 0.00–0.09)

## 2010-10-11 LAB — BASIC METABOLIC PANEL
CO2: 25 mEq/L (ref 19–32)
Chloride: 106 mEq/L (ref 96–112)
GFR calc non Af Amer: >60 mL/min (ref >60)
Glucose, Bld: 103 mg/dL — ABNORMAL HIGH (ref 70–99)
Potassium: 3.4 mEq/L — ABNORMAL LOW (ref 3.5–5.1)
Sodium: 139 mEq/L (ref 135–145)

## 2010-10-12 ENCOUNTER — Encounter: Payer: Self-pay | Admitting: Adult Health

## 2010-10-12 ENCOUNTER — Ambulatory Visit (INDEPENDENT_AMBULATORY_CARE_PROVIDER_SITE_OTHER): Payer: Self-pay | Admitting: Adult Health

## 2010-10-12 DIAGNOSIS — R002 Palpitations: Secondary | ICD-10-CM

## 2010-10-12 DIAGNOSIS — I10 Essential (primary) hypertension: Secondary | ICD-10-CM

## 2010-10-12 DIAGNOSIS — R079 Chest pain, unspecified: Secondary | ICD-10-CM

## 2010-10-17 NOTE — Assessment & Plan Note (Signed)
Summary: per Tammy for BP issues/chest discomfort/tg   Visit Type:  Follow-up Primary Provider:  Dr. Syliva Overman   History of Present Illness: Mary Roman is a 39 AAF we are seeing on follow-up for complaints of chest pain.  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  She has a history of hypertension and chronic back pain.  She was evaluated by Dr. Dietrich Pates for symptoms of palpitations and chest discomfort in February of this year.  This included a holter monitor and a stress test.  The stress echo was found to be negative, the monitor demonstrated frequent PVC's.  She was given reasurrence.  She was at work yesterday at [employer] when she began to feel badly.  She actually has been feeling poorly for 3 days with chest pressure and palpatations. She has had a burning sensation in her chest with radiation up to her throat.  She became anxious about this and had BP checked in the office and it was mildly elevated into the 150's systolic per her report. She was told to see [place] today.  She continues with these symptoms today other they are milder.  Preventive Screening-Counseling & Management  Caffeine-Diet-Exercise     Caffeine use/day: 6-7 coffee     Caffeine Counseling: decrease use of caffeine  Current Medications (verified): 1)  Tylenol 500 Mg .... 2 Tab As Needed 2)  Implanon 68 Mg Impl (Etonogestrel) .... Inserteed 08/2009, Duration of Use 3 Years 3)  Maxzide-25 37.5-25 Mg Tabs (Triamterene-Hctz) .... Take 1 Tablet By Mouth Once A Day 4)  Acyclovir 400 Mg Tabs (Acyclovir) .... Take As Needed 5)  Klor-Con M20 20 Meq Cr-Tabs (Potassium Chloride Crys Cr) .... Take 1 Tablet By Mouth Once A Day 6)  Hydrocodone-Acetaminophen 5-500 Mg Tabs (Hydrocodone-Acetaminophen) .... Take 1 Tab By Mouth At Bedtime As Needed For Backache 7)  Metoprolol Tartrate 25 Mg Tabs (Metoprolol Tartrate) .... Take 1/2 Tablet By Mouth Two Times A Day 8)  Protonix 40 Mg Tbec (Pantoprazole  Sodium) .... Take 1 Tablet By Mouth Once Daily  Allergies (verified): No Known Drug Allergies  Comments:  Nurse/Medical Assistant: patient and i reviewed med list from previous ov she stopped her buspiron due to dizziness  Social History: Caffeine use/day:  6-7 coffee  Review of Systems       All other systems have been reviewed and are negative unless stated above.   Vital Signs:  Patient profile:   40 year old female Weight:      180 pounds BMI:     30.06 Pulse rate:   103 / minute BP sitting:   136 / 82  (left arm)  Vitals Entered By: Dreama Saa, CNA (October 12, 2010 11:48 AM)  Physical Exam  General:  Well developed, well nourished, in no acute distress. Mouth:  poor dentition.   Lungs:  Clear bilaterally to auscultation and percussion. Heart:  Non-displaced PMI, chest non-tender; regular rate and rhythm, S1, S2 without murmurs, rubs or gallops. Carotid upstroke normal, no bruit. Normal abdominal aortic size, no bruits. Femorals normal pulses, no bruits. Pedals normal pulses. No edema, no varicosities. Abdomen:  Bowel sounds positive; abdomen soft and non-tender without masses, organomegaly, or hernias noted. No hepatosplenomegaly. Negative Murphy's sign. Msk:  Back normal, normal gait. Muscle strength and tone normal. Pulses:  pulses normal in all 4 extremities Extremities:  No clubbing or cyanosis. Neurologic:  Alert and oriented x 3. Psych:  anxious.  Impression & Recommendations:  Problem # 1:  PALPITATIONS (ICD-785.1) I have counselled her on caffine use as she admits to drinking 5-6 caffinated drinks a day.  I have given her a low dose metoprolol 12.5mg  two times a day. If she tolerates this and she has improvement in symptoms, we can change to long acting.   Her updated medication list for this problem includes:    Metoprolol Tartrate 25 Mg Tabs (Metoprolol tartrate) .Marland Kitchen... Take 1/2 tablet by mouth two times a day  Problem # 2:  CHEST PAIN  (ICD-786.50) This appears to be noncardiac in origin and do not feel we need to progress to cardiac catherization at this time, as stress echo was normal.  I  believe this to be more GI in etiology and therefore have started her on protonix 40mg  daily. Cutting back on caffine, again, would be helpful for these symptoms.  Recommendations for GI workup at Dr. Anthony Sar descretion. Her updated medication list for this problem includes:    Metoprolol Tartrate 25 Mg Tabs (Metoprolol tartrate) .Marland Kitchen... Take 1/2 tablet by mouth two times a day  Problem # 3:  GENERALIZED ANXIETY DISORDER (ICD-300.02) She requests antianxiety medications.  I have advised her to discuss this with Dr. Lodema Hong.  Patient Instructions: 1)  Your physician recommends that you schedule a follow-up appointment in: 2 months 2)  Your physician has recommended you make the following change in your medication: start taking Metoprolol 12.5 (1/2 of 25mg  tablet) by mouth two times a day and Protonix 40mg  by mouth once daily  3)  ****Please decrease caffine in your diet**** Prescriptions: PROTONIX 40 MG TBEC (PANTOPRAZOLE SODIUM) take 1 tablet by mouth once daily  #30 x 3   Entered by:   Larita Fife Via LPN   Authorized by:   Joni Reining, NP   Signed by:   Larita Fife Via LPN on 16/04/9603   Method used:   Electronically to        [facility]  [address] (retail)       [address]       [place], [place]  [address]       Ph: [phone]       Fax: [phone]   RxID:   [id number] METOPROLOL TARTRATE 25 MG TABS (METOPROLOL TARTRATE) take 1/2 tablet by mouth two times a day  #30 x 3   Entered by:   Larita Fife Via LPN   Authorized by:   Joni Reining, NP   Signed by:   Larita Fife Via LPN on 05/26/2535   Method used:   Electronically to        [facility]  [address] (retail)       [address]       [place], [place]  [address]       Ph: [phone]       Fax: [phone]   RxID:   [id number]

## 2010-10-20 ENCOUNTER — Telehealth: Payer: Self-pay | Admitting: Cardiology

## 2010-10-20 NOTE — Telephone Encounter (Signed)
Pt was told she would be mailed a lab order. She has not received it yet and is calling to check up on it, she needs to know if she is to continue taking a rx.

## 2010-10-23 ENCOUNTER — Other Ambulatory Visit: Payer: Self-pay | Admitting: Cardiology

## 2010-10-23 ENCOUNTER — Ambulatory Visit: Payer: Self-pay | Admitting: Family Medicine

## 2010-10-24 LAB — COMPREHENSIVE METABOLIC PANEL
AST: 13 U/L (ref 0–37)
Albumin: 4.3 g/dL (ref 3.5–5.2)
BUN: 10 mg/dL (ref 6–23)
Calcium: 9.6 mg/dL (ref 8.4–10.5)
Chloride: 104 mEq/L (ref 96–112)
Glucose, Bld: 98 mg/dL (ref 70–99)
Potassium: 4.2 mEq/L (ref 3.5–5.3)

## 2010-10-24 LAB — LIPID PANEL: HDL: 38 mg/dL — ABNORMAL LOW (ref >39)

## 2010-11-16 ENCOUNTER — Encounter: Payer: Self-pay | Admitting: Family Medicine

## 2010-11-16 ENCOUNTER — Other Ambulatory Visit: Payer: Self-pay | Admitting: Family Medicine

## 2010-11-17 ENCOUNTER — Encounter: Payer: Self-pay | Admitting: Family Medicine

## 2010-11-20 ENCOUNTER — Encounter: Payer: Self-pay | Admitting: Family Medicine

## 2010-11-20 ENCOUNTER — Ambulatory Visit (INDEPENDENT_AMBULATORY_CARE_PROVIDER_SITE_OTHER): Payer: Commercial Managed Care - PPO | Admitting: Family Medicine

## 2010-11-20 DIAGNOSIS — M549 Dorsalgia, unspecified: Secondary | ICD-10-CM

## 2010-11-20 DIAGNOSIS — E669 Obesity, unspecified: Secondary | ICD-10-CM

## 2010-11-20 DIAGNOSIS — I10 Essential (primary) hypertension: Secondary | ICD-10-CM

## 2010-11-20 DIAGNOSIS — Z23 Encounter for immunization: Secondary | ICD-10-CM

## 2010-11-20 MED ORDER — HYDROCODONE-ACETAMINOPHEN 5-500 MG PO TABS: ORAL_TABLET | ORAL | Status: DC

## 2010-11-20 MED ORDER — TRIAMTERENE-HCTZ 37.5-25 MG PO TABS: 1.0000 | ORAL_TABLET | Freq: Every day | ORAL | Status: DC

## 2010-11-20 NOTE — Progress Notes (Signed)
  Subjective:    Patient ID: Mary Roman, female    DOB: 11/11/1970, 40 y.o.   MRN: [id number]  HPI The PT is here for follow up and re-evaluation of chronic medical conditions, medication management and review of any  recent lab or  radiology data.  Preventive health is updated, specifically  Cancer screening,and Immunization.   Questions or concerns regarding consultations or procedures which the PT has had in the interim are  Addressed.She has had full cardiac eval and has been essentially cleared, (return if needed.) The PT denies any adverse reactions to current medications since the last visit.  There are no new concerns.  C/O continued back pain and is looking forward to seeing a specialist about this. She is disappointed in her weight and intends to work on this      Review of Systems See HPI. Denies recent fever or chills. Denies sinus pressure, nasal congestion, ear pain or sore throat. Denies chest congestion, productive cough or wheezing. Denies chest pains, palpitations, paroxysmal nocturnal dyspnea, orthopnea and leg swelling Denies abdominal pain, nausea, vomiting,diarrhea or constipation.  Denies rectal bleeding or change in bowel movement. Denies dysuria, frequency, hesitancy or incontinence.  Denies headaches, seizure, numbness, or tingling. Denies depression, anxiety or insomnia. Denies skin break down or rash.        Objective:   Physical Exam Patient alert and oriented and in no Cardiopulmonary distress.  HEENT: No facial asymmetry, EOMI, no sinus tenderness, TM's clear, Oropharynx pink and moist.  Neck supple no adenopathy.  Chest: Clear to auscultation bilaterally.  CVS: S1, S2 no murmurs, no S3.  ABD: Soft non tender. Bowel sounds normal.  Ext: No edema  MS: decreased  ROM spineadequate in , shoulders, hips and knees.  Skin: Intact, no ulcerations or rash noted.  Psych: Good eye contact, normal affect. Memory intact not anxious or  depressed appearing.  CNS: CN 2-12 intact, power, tone and sensation normal throughout.        Assessment & Plan:

## 2010-11-20 NOTE — Telephone Encounter (Signed)
Refilled at ov on 11/20/2010

## 2010-11-20 NOTE — Patient Instructions (Signed)
F/u in 4 months.  Pls sched your pap.  TDaP today.  It is important that you exercise regularly at least 30 minutes 5 times a week. If you develop chest pain, have severe difficulty breathing, or feel very tired, stop exercising immediately and seek medical attention  A healthy diet is rich in fruit, vegetables and whole grains. Poultry fish, nuts and beans are a healthy choice for protein rather then red meat. A low sodium diet and drinking 64 ounces of water daily is generally recommended. Oils and sweet should be limited. Carbohydrates especially for those who are diabetic or overweight, should be limited to 34-45 gram per meal. It is important to eat on a regular schedule, at least 3 times daily. Snacks should be primarily fruits, vegetables or nuts.

## 2010-12-06 NOTE — Assessment & Plan Note (Signed)
Unchanged, uncontrolled, pt to re-establish care with specialist, continue pain management from this office at this time

## 2010-12-06 NOTE — Assessment & Plan Note (Addendum)
Deteriorated, 9 pound weight gain.. Patient re-educated about  the importance of commitment to a  minimum of 150 minutes of exercise per week. The importance of healthy food choices with portion control discussed. Encouraged to start a food diary, count calories and to consider  joining a support group. Sample diet sheets offered. Goals set by the patient for the next several months.

## 2010-12-06 NOTE — Assessment & Plan Note (Signed)
Controlled, no change in medication  

## 2010-12-08 DIAGNOSIS — Z3042 Encounter for surveillance of injectable contraceptive: Secondary | ICD-10-CM

## 2010-12-11 ENCOUNTER — Ambulatory Visit (INDEPENDENT_AMBULATORY_CARE_PROVIDER_SITE_OTHER): Payer: Commercial Managed Care - PPO | Admitting: Cardiology

## 2010-12-11 ENCOUNTER — Encounter: Payer: Self-pay | Admitting: Cardiology

## 2010-12-11 VITALS — BP 141/86 | HR 98 | Ht 66.0 in | Wt 190.0 lb

## 2010-12-11 DIAGNOSIS — F411 Generalized anxiety disorder: Secondary | ICD-10-CM

## 2010-12-11 DIAGNOSIS — I10 Essential (primary) hypertension: Secondary | ICD-10-CM

## 2010-12-11 DIAGNOSIS — R0789 Other chest pain: Secondary | ICD-10-CM

## 2010-12-11 DIAGNOSIS — E669 Obesity, unspecified: Secondary | ICD-10-CM

## 2010-12-11 NOTE — Assessment & Plan Note (Signed)
Weight loss advised.  Information given regarding the DASH diet.  I will plan to see this nice woman again in 9 months.

## 2010-12-11 NOTE — Assessment & Plan Note (Signed)
Chest discomfort persists, albeit less frequently.  Risk of coronary disease is extremely low with the patient's only risk factor being controlled hypertension.  Recent lipid profile was good.  She is premenopausal.  Further testing is not warranted at present.

## 2010-12-11 NOTE — Assessment & Plan Note (Signed)
Blood pressure control is good; current medications will be continued.  Weight loss is advised.

## 2010-12-11 NOTE — Patient Instructions (Signed)
Your physician recommends that you schedule a follow-up appointment in: 9 months Your physician recommends that you continue on your current medications as directed. Please refer to the Current Medication list given to you today. Your physician encouraged you to lose weight for better health.  Your physician discussed the importance of regular exercise and recommended that you start or continue a regular exercise program for good health.   Dash diet-handout

## 2010-12-11 NOTE — Progress Notes (Signed)
HPI : Ms. Gorniak returns to the office as scheduled for continued assessment and treatment of palpitations and chest discomfort.  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  Since her last visit, she is substantially improved.  In recent days, she has noted a different type of chest discomfort, experienced as paresthesias over the mid to upper chest  These are mild and unassociated with other symptoms.  Palpitations have virtually resolved with reduction of caffeine intake and very low-dose beta blocker.  She continues to experience rare episodes of chest discomfort, sometimes with radiation to the arms and shoulders, unrelated to exertion, of moderate severity, that passes spontaneously.  She has been relatively inactive of late and has been gaining weight.  She did not notice a difference during treatment with a PPI, which he has discontinued.  A trial of benzodiazepines provided by Dr. Lodema Hong resulted in dizziness and was discontinued.  Current Outpatient Prescriptions on File Prior to Visit  Medication Sig Dispense Refill  . acetaminophen (TYLENOL) 500 MG tablet Take 500 mg by mouth.        Marland Kitchen acyclovir (ZOVIRAX) 400 MG tablet Take 400 mg by mouth as needed.        . Etonogestrel (IMPLANON) 68 MG IMPL Inject into the skin. Inserted 08/2009, duration of of use 3 years       . HYDROcodone-acetaminophen (VICODIN) 5-500 MG per tablet Take one tablet by mouth at bedtime as needed for backache  30 tablet  3  . metoprolol tartrate (LOPRESSOR) 25 MG tablet Take 25 mg by mouth. One-half tab twice daily       . triamterene-hydrochlorothiazide (MAXZIDE-25) 37.5-25 MG per tablet Take 1 tablet by mouth daily.  30 tablet  3  . DISCONTD: pantoprazole (PROTONIX) 40 MG tablet Take 40 mg by mouth daily.           No Known Allergies    Past medical history, social history, and family history reviewed and updated.  ROS: No orthopnea, PND, lightheadedness or syncope.  No cough or sputum  production.  PHYSICAL EXAM: BP 141/86  Pulse 98  Ht 5\' 6"  (1.676 m)  Wt 190 lb (86.183 kg)  BMI 30.67 kg/m2  SpO2 92%  LMP 10/27/2010 ; previous weight-180 pounds General-Well developed; no acute distress Body habitus-mildly obese HEENT:  Poor dentition Neck-No JVD; no carotid bruits; 1+ diffuse thyromegaly Lungs-clear lung fields; resonant to percussion; regular rhythm without tachycardia Cardiovascular-normal PMI; normal S1 and S2 Abdomen-normal bowel sounds; soft and non-tender without masses or organomegaly Musculoskeletal-No deformities, no cyanosis or clubbing Neurologic-Normal cranial nerves; symmetric strength and tone Skin-Warm, no significant lesions Extremities-distal pulses intact; no edema  ASSESSMENT AND PLAN:

## 2010-12-11 NOTE — Assessment & Plan Note (Signed)
Symptoms are improved.  She might benefit from a lower dose of benzodiazepine or behavioral therapy.  Dr. Lodema Hong is managing this problem.

## 2010-12-15 NOTE — H&P (Signed)
NAME:  Mary Roman, Mary Roman                     ACCOUNT NO.:  [id number]   MEDICAL RECORD NO.:  [id number]                   PATIENT TYPE:  OIB   LOCATION:  A414                                 FACILITY:  [facility]   PHYSICIAN:  Jacklyn Shell, C.N.M.    DATE OF BIRTH:  Oct 25, 1970   DATE OF ADMISSION:  05/01/2002  DATE OF DISCHARGE:  05/01/2002                                HISTORY & PHYSICAL   CHIEF COMPLAINT:  Scheduled induction due to chronic hypertension,  proteinuria, and today's visit.   HISTORY OF PRESENT ILLNESS:  The patient is a 40 year old, gravida 3, para  0, AB 2, with an EDC of May 06, 2002, based on first and second trimester  ultrasounds with an [facility] of April 22, 2002, based on a last menstrual  period.  The patient began her prenatal care early in her first trimester  and has had regular visits throughout.  Prenatal Laboratory Data:  Blood  type A+.  Rubella immune.  HBSAG, HIV, RPR, gonorrhea, and chlamydia have  all been negative.  MSAFP was within normal limits.  A one-hour GTT was  within normal limits at 130.  Group B Streptococcus is positive.  Total  weight gain has been 30 pounds.  Appropriate fundal height grown.  Blood  pressures have been 110s-150s/70s-90s.   PAST MEDICAL HISTORY:  Positive for hypertension for which she spent two  years of Diovan.  Since she became pregnant, she has been on Aldomet 500 mg  p.o. b.i.d.  The creatinine clearance and total protein early in the first  trimester were within normal limits.  We have since over the past month or  so increased her blood pressure medicine to Aldomet 500 mg p.o. b.i.d.,  which has done a good job of controlling her blood pressure.  She has been  getting biweekly nonstress tests since 36 weeks and has done well with them.   PAST SURGICAL HISTORY:  Noncontributory.   ALLERGIES:  No known drug allergies.   SOCIAL HISTORY:  Single.  Involved with the father of the baby.  She is a  Hydrologist.   FAMILY HISTORY:  Positive for diabetes and hypertension.   OBSTETRICAL HISTORY:  Two TABs.   PHYSICAL EXAMINATION:  HEENT:  Wears corrective lenses.  Poor dentition.  HEART:  Regular rate and rhythm.  LUNGS:  Clear to auscultation.  ABDOMEN:  Soft and nontender.  Fundal height of 40 cm.  PELVIC:  The cervix is soft, fingertip, mid position, -1 to -2 station, and  vertex presentation.  EXTREMITIES:  Legs with mild nonpitting edema.  DTRs are 2-3+.   REVIEW OF SYMPTOMS:  The urine in the office was 2+ protein and trace blood.  The patient denies headache, visual changes, and epigastric pain.  Palpable  fetal movement with a 140s heartbeat.   IMPRESSION:  1. Intrauterine pregnancy at 39-5/7 weeks.  2. Chronic hypertension.  3. Proteinuria.  4. Induction of labor.   PLAN:  Go ahead  and go to the hospital now to add LFTs, uric acid, and q.  void protein checks in addition to prenatal labs.  Will plan a balloon  induction for this evening with Pitocin in the morning.  Also plan GBS  prophylaxis.                                               Jacklyn Shell, C.N.M.    FC/MEDQ  D:  05/04/2002  T:  05/04/2002  Job:  [id number]   cc:   [facility]

## 2010-12-15 NOTE — H&P (Signed)
NAMEAERABELLA, GALASSO NO.:  [id number]   MEDICAL RECORD NO.:  [id number]          PATIENT TYPE:  INP   LOCATION:  LDR1                          FACILITY:  [facility]   PHYSICIAN:  Lazaro Arms, M.D.   DATE OF BIRTH:  Nov 11, 1970   DATE OF ADMISSION:  08/14/2006  DATE OF DISCHARGE:  LH                              HISTORY & PHYSICAL   Mary Roman is a 40 year old African American female, gravida 4 para 1,  abortus 2, with estimated date of delivery of August 18, 2006,  currently at 39-1/[redacted] weeks gestation.  She has chronic hypertension and  is on Aldomet 500 mg p.o. b.i.d.  Otherwise, her prenatal course was  complicated by increased risk of Down's with triple test.  [facility] did an ultrasound which returned as normal.  She also of  course has advanced maternal age.  Her cervix is 2, thick, -3 vertex,  soft, and posterior.  Because of her hypertension, we are going to go  ahead and do Foley bulb ripening of the cervix followed by Pitocin  induction of labor.   PAST MEDICAL HISTORY:  Hypertension.   PAST SURGERY:  Negative.   PAST OB HISTORY:  She has had a vaginal delivery back in 2003.   ALLERGIES:  PENICILLIN.   MEDICATIONS:  Her only medication is Aldomet p.o. b.i.d.   Blood type is A positive.  Varicella was immune.  Urine drug screen was  negative.  Rubella was immune.  Hepatitis B was negative.  HIV was  nonreactive.  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  Her serology is nonreactive x2.  Pap is normal.  GC  and Chlamydia were negative x2.  Her AFP of course had increased Down  risk, but she had an ultrasound at [facility] and declined any  further evaluation with amnio, etc.  Her group B Streptococcus is  positive from her previous pregnancy.  Her Glucola was normal at 118.   IMPRESSION:  1. Intrauterine pregnancy at 39-1/[redacted] weeks gestation.  2. Unfavorable cervix.  3. Chronic hypertension.   PLAN:  The  patient is admitted for Foley bulb ripening of the cervix,  followed by Pitocin induction of labor.  She understands the indications  for induction and will proceed.      Lazaro Arms, M.D.  Electronically Signed     LHE/MEDQ  D:  08/12/2006  T:  08/12/2006  Job:  [id number]

## 2010-12-15 NOTE — Op Note (Signed)
   NAME:  Mary Roman, Mary Roman                     ACCOUNT NO.:  [id number]   MEDICAL RECORD NO.:  [id number]                   PATIENT TYPE:  INP   LOCATION:  A426                                 FACILITY:  [facility]   PHYSICIAN:  Tilda Burrow, M.D.              DATE OF BIRTH:  Jan 10, 1971   DATE OF PROCEDURE:  DATE OF DISCHARGE:                                 OPERATIVE REPORT   DELIVERY NOTE:  The patient was noted to be fully dilated with an urge to  push at 1340 p.m.  She pushed fairly effectively until approximately 1445  when the baby seemed to be stuck behind the pubic arch.  Additionally the  baby's heartbeat was noted to be in the 50s and 60s even in between  contractions.  At this point, a Kiwi vacuum was offered and accepted to  expedite the delivery due to fetal bradycardia.   The Kiwi was placed and some progress was made, however, the vacuum popped  off the fetal vertex 3 times.  At this point Dr. Emelda Fear was called to  assist with the delivery.  The baby was noted, by Dr. Emelda Fear, to be an ROP  position.  The Kiwi was reapplied by him and the baby was rotated to an ROA  position.  After the delivery of the head and loose nuchal cord was reduced  to expedite the delivery of the fetal body the posterior shoulder was  grasped using axillary traction and the baby was rotated into an oblique  position where the body delivered easily.  Apgars were 8 and 9.  Weight is  pending.  The vagina was inspected and a second degree laceration is noted.   At this point the placenta has not been delivered.  The patient is  tolerating the procedure fine under epidural anesthesia.     Jacklyn Shell, C.N.M.          Tilda Burrow, M.D.    FC/MEDQ  D:  05/05/2002  T:  05/06/2002  Job:  [id number]   cc:   [facility]

## 2010-12-15 NOTE — Op Note (Signed)
   NAME:  Mary Roman, Mary Roman                     ACCOUNT NO.:  [id number]   MEDICAL RECORD NO.:  [id number]                   PATIENT TYPE:  INP   LOCATION:  A426                                 FACILITY:  [facility]   PHYSICIAN:  Lazaro Arms, M.D.                DATE OF BIRTH:  19-Jul-1971   DATE OF PROCEDURE:  05/05/2002  DATE OF DISCHARGE:                                 OPERATIVE REPORT   EPIDURAL NOTE:  This patient is a 40 year old, healthy, gravida 1 who is in  labor wanting an epidural.  Her cervix is 4 cm.  She already has had a dose  of Nubain and she is requesting an epidural.  The patient received 500 cc  bolus of fluid as a preload.   The patient was placed in the sitting position.  The iliac crests are  spinous processes are identified and marked.  The L2-L3 and L3-L4  interspaces were marked.  The area was prepped and field draped.  Lidocaine  1% was injected in the L2-L3 interspace.  A 17-gauge Touhy needle was used  and a loss of resistance technique employed to find the epidural space with  1 pass.   The epidural catheter is threaded to depth of 13 cm or 5 cm within the  epidural space.  Ten cc of 0.125% bupivacaine plain is injected at 0725 and  an additional 10 cc is injected 0728 with no adverse reactions.  The  catheter was taped down.  A 9 cc 0.125% bupivacaine with 2 mcg/cc of  Fentanyl bolus was given and then she was placed on 12 cc, continuously, an  hour.  The blood pressure is stable at 144/90.  She states that her pain  level now has gone from a 7 down to a 1 or a 2.  She is still able to move  her legs.                                               Lazaro Arms, M.D.    Loraine Maple  D:  05/05/2002  T:  05/06/2002  Job:  [id number]

## 2010-12-15 NOTE — Op Note (Signed)
[name], SALTSMAN NO.:  [id number]   MEDICAL RECORD NO.:  [id number]          PATIENT TYPE:  INP   LOCATION:  LDR1                          FACILITY:  [facility]   PHYSICIAN:  Lazaro Arms, M.D.   DATE OF BIRTH:  16-Jul-1971   DATE OF PROCEDURE:  08/15/2006  DATE OF DISCHARGE:                               OPERATIVE REPORT   EPIDURAL NOTE:  Mary Roman is a 40 year old African-American female, gravida  2, para 1, at 39-1/[redacted] weeks gestation who is in labor and delivery and  for induction because of chronic hypertension.  She is on Aldomet twice  a day.  She had a Foley bulb placed last night.  She is 4 cm this  morning, 100% effaced, -3 and requesting an epidural.   The patient is placed in a sitting position.  Betadine prep was used,  and 1% lidocaine is injected in the L3-4 interspace.  The area is field-  draped.  A 17 gauge Tuohy needle was used.  Loss of resistance technique  employed.  The epidural space is found without difficulty.  Bupivacaine  plain 0.125% 10 cc is given to the epidural space without ill effects.  The epidural catheter is then fed and taken down 5 cm from the epidural  space.  An additional 10 cc is given through the catheter to dose up the  epidural and a continuous infusion of 0.125% Bupivacaine with 2 mcg/cc  of fentanyl is begun at 12 cc/hr.  The patient tolerated the procedure  well.  She is getting good pain relief.  Fetal heart rate tracing is  stable.  Her blood pressure is stable.      Lazaro Arms, M.D.  Electronically Signed     LHE/MEDQ  D:  08/15/2006  T:  08/15/2006  Job:  [id number]

## 2010-12-15 NOTE — Discharge Summary (Signed)
Mary Roman, BURBRIDGE NO.:  [id number]   MEDICAL RECORD NO.:  [id number]          PATIENT TYPE:  INP   LOCATION:  A411                          FACILITY:  [facility]   PHYSICIAN:  Tilda Burrow, M.D. DATE OF BIRTH:  1971/07/21   DATE OF ADMISSION:  08/14/2006  DATE OF DISCHARGE:  01/19/2008LH                               DISCHARGE SUMMARY   ADMISSION DIAGNOSIS:  Pregnancy at 39-1/2 weeks' gestation with chronic  hypertension and unfavorable cervix, medically indicating induction of  labor.   DISCHARGE DIAGNOSES:  1. Pregnancy at 39-1/2 weeks' gestation, delivered.  2. Chronic hypertension.   DISCHARGE MEDICATIONS:  1. Aldomet 500 mg p.o. b.i.d. x30 days (conversion back to Diovan to      be decided upon by Dr. Lodema Hong).  2. Prenatal vitamins one twice daily.  3. Motrin 800 mg one every 8 hours p.r.n. cramps.  4. Vicodin 5/500 mg one to two q.4h. p.r.n. moderate pain.   HOSPITAL SUMMARY:  A 40 year old gravida 4, para 1, AB 2, with chronic  hypertension on Aldomet 500 mg p.o. b.i.d. with satisfactory blood  pressure control, who was admitted at 39-1/2 weeks for induction.  Her  cervix is 2, thick, -3, soft, posterior.  We did a medically-indicated  induction of labor due to hypertension.  She had had reactive NSTs  biweekly.   HOSPITAL COURSE:  The patient was admitted and underwent Foley bulb  cervical ripening and followed by Pitocin induction of labor.  She  progressed nicely through the labor process, delivered at 3 p.m. on  August 15, 2006, of a healthy female infant, Apgars 7 and 8, weight 8  pounds 9 ounces, over an intact perineum.  Delivery was notable for an  unexpected need for positive pressure ventilation in the baby for 6  minutes due to decreased respiratory effort from the baby.  There was no  meconium.  There was no malodor.  Nonetheless, the baby was kept in the  nursery on IV antibiotics for the first day.   The patient's postpartum  course was straightforward.  She had a  preadmission anemia with a hemoglobin of 9.2, hematocrit 28.  Blood type  A positive.  Postpartum hemoglobin 7.5, hematocrit 23, despite normal  lochia on postoperative day #1.  She will be discharged home on the  previously-mentioned medicines plus Chromagen Forte twice daily x30 days  and plans Mirena IUD for birth control in the future.  She will be  bottle-feeding.      Tilda Burrow, M.D.  Electronically Signed     JVF/MEDQ  D:  08/17/2006  T:  08/17/2006  Job:  [id number]   cc:   Milus Mallick. Lodema Hong, M.D.  Fax: [phone]

## 2010-12-15 NOTE — Group Therapy Note (Signed)
NAMEGLADYCE, MCRAY NO.:  [id number]   MEDICAL RECORD NO.:  [id number]          PATIENT TYPE:  INP   LOCATION:  A411                          FACILITY:  [facility]   PHYSICIAN:  Tilda Burrow, M.D. DATE OF BIRTH:  1970-11-30   DATE OF PROCEDURE:  DATE OF DISCHARGE:                                 PROGRESS NOTE   Mary Roman was complaining of some cramps and was noted to be fully dilated  at approximately 2:30 p.m.  She pushed for a little over an hour and had  a spontaneous vaginal delivery of a viable female infant at 27.  There  was a loose nuchal cord which was easily reduced.  The shoulders were a  little bit tight so the right posterior axilla was grasped and the baby  was delivered with a Wood's corkscrew maneuver.  Apgar's are 7 and 8.  Weight 8 pounds 9 ounces.  Please see the nurse's notes for  resuscitative efforts.  Pitocin 20 units diluted in a 1000 mL was  infused rapidly IV.  The placenta separated spontaneously and delivered  via controlled cord traction at 1550.  It was inspected and appears to  be intact with a three-vessel cord.  Estimated blood loss 250 mL.  The  vagina was inspected and no lacerations were found.  The epidural  catheter was removed with the blue tip visualized as being intact.      Mary Roman, C.N.M.      Tilda Burrow, M.D.  Electronically Signed    FC/MEDQ  D:  08/15/2006  T:  08/15/2006  Job:  [id number]   cc:   [facility]   Jeoffrey Massed, MD  Fax: [phone]

## 2010-12-29 ENCOUNTER — Telehealth: Payer: Self-pay | Admitting: Cardiology

## 2010-12-29 NOTE — Telephone Encounter (Signed)
Patient received letter with labwork dated 2/6 / wants to know if this is old / tg

## 2010-12-29 NOTE — Telephone Encounter (Signed)
Pt has already had her labwork done.

## 2011-03-26 ENCOUNTER — Other Ambulatory Visit: Payer: Self-pay | Admitting: Family Medicine

## 2011-03-31 ENCOUNTER — Other Ambulatory Visit: Payer: Self-pay | Admitting: Family Medicine

## 2011-04-03 ENCOUNTER — Other Ambulatory Visit: Payer: Self-pay | Admitting: Obstetrics and Gynecology

## 2011-04-03 ENCOUNTER — Telehealth: Payer: Self-pay | Admitting: Family Medicine

## 2011-04-03 DIAGNOSIS — Z09 Encounter for follow-up examination after completed treatment for conditions other than malignant neoplasm: Secondary | ICD-10-CM

## 2011-04-03 DIAGNOSIS — I10 Essential (primary) hypertension: Secondary | ICD-10-CM

## 2011-04-03 MED ORDER — TRIAMTERENE-HCTZ 37.5-25 MG PO TABS: 1.0000 | ORAL_TABLET | Freq: Every day | ORAL | Status: DC

## 2011-04-03 NOTE — Telephone Encounter (Signed)
One month refill sent for patient

## 2011-04-13 ENCOUNTER — Other Ambulatory Visit: Payer: Self-pay

## 2011-04-13 DIAGNOSIS — I10 Essential (primary) hypertension: Secondary | ICD-10-CM

## 2011-04-13 MED ORDER — TRIAMTERENE-HCTZ 37.5-25 MG PO TABS: 1.0000 | ORAL_TABLET | Freq: Every day | ORAL | Status: DC

## 2011-04-19 ENCOUNTER — Encounter: Payer: Self-pay | Admitting: Family Medicine

## 2011-04-19 ENCOUNTER — Other Ambulatory Visit: Payer: Self-pay | Admitting: Adult Health

## 2011-04-23 ENCOUNTER — Encounter: Payer: Self-pay | Admitting: Family Medicine

## 2011-04-23 ENCOUNTER — Ambulatory Visit (INDEPENDENT_AMBULATORY_CARE_PROVIDER_SITE_OTHER): Payer: 59 | Admitting: Family Medicine

## 2011-04-23 VITALS — BP 124/70 | HR 78 | Resp 16 | Ht 66.5 in | Wt 200.0 lb

## 2011-04-23 DIAGNOSIS — R002 Palpitations: Secondary | ICD-10-CM

## 2011-04-23 DIAGNOSIS — E785 Hyperlipidemia, unspecified: Secondary | ICD-10-CM

## 2011-04-23 DIAGNOSIS — M549 Dorsalgia, unspecified: Secondary | ICD-10-CM

## 2011-04-23 DIAGNOSIS — Z0000 Encounter for general adult medical examination without abnormal findings: Secondary | ICD-10-CM

## 2011-04-23 DIAGNOSIS — Z1211 Encounter for screening for malignant neoplasm of colon: Secondary | ICD-10-CM

## 2011-04-23 DIAGNOSIS — F411 Generalized anxiety disorder: Secondary | ICD-10-CM

## 2011-04-23 DIAGNOSIS — E669 Obesity, unspecified: Secondary | ICD-10-CM

## 2011-04-23 DIAGNOSIS — I10 Essential (primary) hypertension: Secondary | ICD-10-CM

## 2011-04-23 MED ORDER — IBUPROFEN 800 MG PO TABS: 800.0000 mg | ORAL_TABLET | Freq: Three times a day (TID) | ORAL | Status: AC | PRN

## 2011-04-23 MED ORDER — HYDROCODONE-ACETAMINOPHEN 5-500 MG PO TABS: 1.0000 | ORAL_TABLET | Freq: Every evening | ORAL | Status: DC | PRN

## 2011-04-23 MED ORDER — KETOROLAC TROMETHAMINE 60 MG/2ML IM SOLN
60.0000 mg | Freq: Once | INTRAMUSCULAR | Status: AC
  Administered 2011-04-23: 60 mg via INTRAMUSCULAR

## 2011-04-23 MED ORDER — TRIAMTERENE-HCTZ 37.5-25 MG PO TABS: 1.0000 | ORAL_TABLET | Freq: Every day | ORAL | Status: DC

## 2011-04-23 NOTE — Assessment & Plan Note (Signed)
Improved, on no regular medication, social situation has improved, now working full time with weekend work also

## 2011-04-23 NOTE — Progress Notes (Signed)
Addended by: Adella Hare B on: 04/23/2011 12:28 PM   Modules accepted: Orders, Medications

## 2011-04-23 NOTE — Assessment & Plan Note (Signed)
Deteriorated. Patient re-educated about  the importance of commitment to a  minimum of 150 minutes of exercise per week. The importance of healthy food choices with portion control discussed. Encouraged to start a food diary, count calories and to consider  joining a support group. Sample diet sheets offered. Goals set by the patient for the next several months.    

## 2011-04-23 NOTE — Assessment & Plan Note (Signed)
Controlled on medication and also followed by cardiology

## 2011-04-23 NOTE — Assessment & Plan Note (Signed)
Controlled, no change in medication  

## 2011-04-23 NOTE — Assessment & Plan Note (Signed)
Uncontrolled, will administer toradol and prescribe ibuprofen, also refer to Dr Ethelene Hal for epidural

## 2011-04-23 NOTE — Patient Instructions (Addendum)
F/U in 4 months.  It is important that you exercise regularly at least 30 minutes 5 times a week. If you develop chest pain, have severe difficulty breathing, or feel very tired, stop exercising immediately and seek medical attention    A healthy diet is rich in fruit, vegetables and whole grains. Poultry fish, nuts and beans are a healthy choice for protein rather then red meat. A low sodium diet and drinking 64 ounces of water daily is generally recommended. Oils and sweet should be limited. Carbohydrates especially for those who are diabetic or overweight, should be limited to 30-45 gram per meal. It is important to eat on a regular schedule, at least 3 times daily. Snacks should be primarily fruits, vegetables or nuts.   You will be referred for individual counseing for weight loss  Weight loss goal is 12  pounds  You will be referred for epidural injection  Pls keep f/u appt for mammogram  You will receive a 1500 cal diet sheet, my fit pal is an excellent help   Fasting chem 7, lipid and HbA1C , TSH and CBC in Dec, we will call with resuklt

## 2011-04-23 NOTE — Progress Notes (Signed)
  Subjective:    Patient ID: Mary Roman, female    DOB: Apr 29, 1971, 40 y.o.   MRN: [id number]  HPI The PT is here for annual exam  and re-evaluation of chronic medical conditions, medication management and review of any available recent lab and radiology data.  Preventive health is updated, specifically  Cancer screening and Immunization.   C/o increased and uncontrolled back pain, has benefited in the past from epidural and is requesting a referral for one. Concerned about weight gain, but has not comited too regular exercise and more control over her diet, will change both     Review of Systems See HPI Denies recent fever or chills. Denies sinus pressure, nasal congestion, ear pain or sore throat. Denies chest congestion, productive cough or wheezing. Denies chest pains, palpitations and leg swelling Denies abdominal pain, nausea, vomiting,diarrhea or constipation.   Denies dysuria, frequency, hesitancy or incontinence.  Denies headaches, seizures, numbness, or tingling. Denies depression, anxiety or insomnia. Denies skin break down or rash.        Objective:   Physical Exam Pleasant well nourished female, alert and oriented x 3, in no cardio-pulmonary distress. Afebrile. HEENT No facial trauma or asymetry. Sinuses non tender.  EOMI, PERTL, fundoscopic exam is normal, no hemorhage or exudate.  External ears normal, tympanic membranes clear. Oropharynx moist, no exudate, poor  dentition. Neck: supple, no adenopathy,JVD or thyromegaly.No bruits.  Chest: Clear to ascultation bilaterally.No crackles or wheezes. Non tender to palpation  Breast: No asymetry,no masses. No nipple discharge or inversion. No axillary or supraclavicular adenopathy  Cardiovascular system; Heart sounds normal,  S1 and  S2 ,no S3.  No murmur, or thrill. Apical beat not displaced Peripheral pulses normal.  Abdomen: Soft, non tender, no organomegaly or masses. No bruits. Bowel sounds  normal. No guarding, tenderness or rebound.  Rectal:  No mass. Guaiac negative stool.  GU: Deferred has pelvic with gynae  Musculoskeletal exam: Decreased  ROM of spine,adequate in  hips , shoulders and knees. No deformity ,swelling or crepitus noted. No muscle wasting or atrophy.   Neurologic: Cranial nerves 2 to 12 intact. Power, tone ,sensation and reflexes normal throughout. No disturbance in gait. No tremor.  Skin: Intact, no ulceration, erythema , scaling or rash noted. Pigmentation normal throughout  Psych; Normal mood and affect. Judgement and concentration normal        Assessment & Plan:

## 2011-04-25 ENCOUNTER — Ambulatory Visit (HOSPITAL_COMMUNITY)
Admission: RE | Admit: 2011-04-25 | Discharge: 2011-04-25 | Disposition: A | Discharge status: Home / Self Care | Payer: 59 | Source: Ambulatory Visit | Attending: Obstetrics and Gynecology | Admitting: Obstetrics and Gynecology

## 2011-04-25 DIAGNOSIS — Z09 Encounter for follow-up examination after completed treatment for conditions other than malignant neoplasm: Secondary | ICD-10-CM | POA: Insufficient documentation

## 2011-04-25 DIAGNOSIS — R928 Other abnormal and inconclusive findings on diagnostic imaging of breast: Secondary | ICD-10-CM | POA: Insufficient documentation

## 2011-05-29 ENCOUNTER — Telehealth (HOSPITAL_COMMUNITY): Payer: Self-pay | Admitting: Dietician

## 2011-07-25 NOTE — Telephone Encounter (Signed)
Also sent letter to pt home via [place] [employer] on 06/26/11 in attempt to contact pt to set up appointment.  Appointment rescheduling attempts have so far been unsuccessful. Referral filed.

## 2011-08-22 ENCOUNTER — Other Ambulatory Visit: Payer: Self-pay | Admitting: Family Medicine

## 2011-08-22 ENCOUNTER — Encounter: Payer: Self-pay | Admitting: Family Medicine

## 2011-08-23 ENCOUNTER — Telehealth: Payer: Self-pay | Admitting: Family Medicine

## 2011-08-23 LAB — CBC WITH DIFFERENTIAL/PLATELET
Eosinophils Absolute: 0.2 10*3/uL (ref 0.0–0.7)
Eosinophils Relative: 2 % (ref 0–5)
Lymphocytes Relative: 36 % (ref 12–46)
Lymphs Abs: 2.3 10*3/uL (ref 0.7–4.0)
MCHC: 32 g/dL (ref 30.0–36.0)
Monocytes Absolute: 0.5 10*3/uL (ref 0.1–1.0)
Monocytes Relative: 8 % (ref 3–12)
Platelets: 546 10*3/uL — ABNORMAL HIGH (ref 150–400)
RDW: 13.5 % (ref 11.5–15.5)

## 2011-08-23 LAB — BASIC METABOLIC PANEL
CO2: 29 mEq/L (ref 19–32)
Calcium: 9.3 mg/dL (ref 8.4–10.5)
Glucose, Bld: 102 mg/dL — ABNORMAL HIGH (ref 70–99)
Sodium: 141 mEq/L (ref 135–145)

## 2011-08-23 LAB — LIPID PANEL
LDL Cholesterol: 112 mg/dL — ABNORMAL HIGH (ref 0–99)
Total CHOL/HDL Ratio: 4.9 Ratio
VLDL: 10 mg/dL (ref 0–40)

## 2011-08-23 MED ORDER — HYDROCODONE-ACETAMINOPHEN 5-500 MG PO TABS: 1.0000 | ORAL_TABLET | Freq: Every evening | ORAL | Status: DC | PRN

## 2011-08-23 NOTE — Telephone Encounter (Signed)
Printed for Dr to sign  

## 2011-08-24 ENCOUNTER — Encounter: Payer: Self-pay | Admitting: Family Medicine

## 2011-08-24 LAB — HEMOGLOBIN A1C
Hgb A1c MFr Bld: 6.4 % — ABNORMAL HIGH (ref <5.7)
Mean Plasma Glucose: 137 mg/dL — ABNORMAL HIGH (ref <117)

## 2011-08-27 ENCOUNTER — Encounter: Payer: Self-pay | Admitting: Family Medicine

## 2011-08-27 ENCOUNTER — Ambulatory Visit (INDEPENDENT_AMBULATORY_CARE_PROVIDER_SITE_OTHER): Payer: 59 | Admitting: Family Medicine

## 2011-08-27 VITALS — BP 114/82 | HR 88 | Resp 16 | Ht 66.5 in | Wt 202.8 lb

## 2011-08-27 DIAGNOSIS — E785 Hyperlipidemia, unspecified: Secondary | ICD-10-CM

## 2011-08-27 DIAGNOSIS — R002 Palpitations: Secondary | ICD-10-CM

## 2011-08-27 DIAGNOSIS — E669 Obesity, unspecified: Secondary | ICD-10-CM

## 2011-08-27 DIAGNOSIS — R7303 Prediabetes: Secondary | ICD-10-CM

## 2011-08-27 DIAGNOSIS — Z3049 Encounter for surveillance of other contraceptives: Secondary | ICD-10-CM

## 2011-08-27 DIAGNOSIS — G4700 Insomnia, unspecified: Secondary | ICD-10-CM

## 2011-08-27 DIAGNOSIS — R7309 Other abnormal glucose: Secondary | ICD-10-CM

## 2011-08-27 DIAGNOSIS — I10 Essential (primary) hypertension: Secondary | ICD-10-CM

## 2011-08-27 DIAGNOSIS — Z3042 Encounter for surveillance of injectable contraceptive: Secondary | ICD-10-CM

## 2011-08-27 DIAGNOSIS — E1159 Type 2 diabetes mellitus with other circulatory complications: Secondary | ICD-10-CM | POA: Insufficient documentation

## 2011-08-27 MED ORDER — TRIAMTERENE-HCTZ 37.5-25 MG PO TABS: 1.0000 | ORAL_TABLET | Freq: Every day | ORAL | Status: DC

## 2011-08-27 MED ORDER — TEMAZEPAM 30 MG PO CAPS: 30.0000 mg | ORAL_CAPSULE | Freq: Every evening | ORAL | Status: DC | PRN

## 2011-08-27 MED ORDER — HYDROCODONE-ACETAMINOPHEN 5-500 MG PO TABS: 1.0000 | ORAL_TABLET | Freq: Every evening | ORAL | Status: DC | PRN

## 2011-08-27 NOTE — Assessment & Plan Note (Signed)
Deteriorated , low fat diet discussed and encouraged, also the need for exercise

## 2011-08-27 NOTE — Assessment & Plan Note (Signed)
hba1c of 6.4 , counseled re need to change to prevent diabetes

## 2011-08-27 NOTE — Assessment & Plan Note (Signed)
Poor sleep habits, TV on, always tired, past h/o insomnia. Sleep hygiene info provided and will start restoril as bneeded

## 2011-08-27 NOTE — Progress Notes (Signed)
Addended by: Abner Greenspan on: 08/27/2011 08:40 AM   Modules accepted: Orders

## 2011-08-27 NOTE — Assessment & Plan Note (Signed)
Controlled, no change in medication  

## 2011-08-27 NOTE — Patient Instructions (Addendum)
F/U in 3.5 month  It is important that you exercise regularly at least 30 minutes 5 times a week. If you develop chest pain, have severe difficulty breathing, or feel very tired, stop exercising immediately and seek medical attention    A healthy diet is rich in fruit, vegetables and whole grains. Poultry fish, nuts and beans are a healthy choice for protein rather then red meat. A low sodium diet and drinking 64 ounces of water daily is generally recommended. Oils and sweet should be limited. Carbohydrates especially for those who are diabetic or overweight, should be limited to 34-45 gram per meal. It is important to eat on a regular schedule, at least 3 times daily. Snacks should be primarily fruits, vegetables or nuts.  Please practice good sleep hygiene, use nmed as needed to assiost with sleep.  Lights out at 10:30pm!  Use my fitness pal.   Weight loss goal is 3 to 4 pounds per month  HBA1C in 3.5 months before f/u visit

## 2011-08-27 NOTE — Assessment & Plan Note (Signed)
Deteriorated. Patient re-educated about  the importance of commitment to a  minimum of 150 minutes of exercise per week. The importance of healthy food choices with portion control discussed. Encouraged to start a food diary, count calories and to consider  joining a support group. Sample diet sheets offered. Goals set by the patient for the next several months.    

## 2011-08-27 NOTE — Assessment & Plan Note (Signed)
Excess 2 week bleeding with insert , has appt next week with gynae, she is anemic with no energy

## 2011-08-27 NOTE — Progress Notes (Signed)
  Subjective:    Patient ID: Mary Roman, female    DOB: 06/08/1971, 41 y.o.   MRN: [id number]  HPI The PT is here for follow up and re-evaluation of chronic medical conditions, medication management and review of any available recent lab and radiology data.  Preventive health is updated, specifically  Cancer screening and Immunization.   Questions or concerns regarding consultations or procedures which the PT has had in the interim are  addressed. The PT denies any adverse reactions to current medications since the last visit.  T  C/o poor sleep and chronic fatigue, has heavy periods with new contraceptive method Not exercising and has gained weight  Labs have deteriorated     Review of Systems See HPI Denies recent fever or chills. Denies sinus pressure, nasal congestion, ear pain or sore throat. Denies chest congestion, productive cough or wheezing. Denies chest pains, palpitations and leg swelling Denies abdominal pain, nausea, vomiting,diarrhea or constipation.   Denies dysuria, frequency, hesitancy or incontinence. Denies joint pain, swelling and limitation in mobility. Denies headaches, seizures, numbness, or tingling. Denies depression, anxiety  Denies skin break down or rash.        Objective:   Physical Exam Patient alert and oriented and in no cardiopulmonary distress.  HEENT: No facial asymmetry, EOMI, no sinus tenderness,  oropharynx pink and moist.  Neck supple no adenopathy.  Chest: Clear to auscultation bilaterally.  CVS: S1, S2 no murmurs, no S3.  ABD: Soft non tender. Bowel sounds normal.  Ext: No edema  MS: Adequate ROM spine, shoulders, hips and knees.  Skin: Intact, no ulcerations or rash noted.  Psych: Good eye contact, normal affect. Memory intact not anxious or depressed appearing.  CNS: CN 2-12 intact, power, tone and sensation normal throughout.        Assessment & Plan:

## 2011-09-03 ENCOUNTER — Other Ambulatory Visit: Payer: Self-pay | Admitting: Adult Health

## 2011-09-03 ENCOUNTER — Other Ambulatory Visit (HOSPITAL_COMMUNITY)
Admission: RE | Admit: 2011-09-03 | Discharge: 2011-09-03 | Disposition: A | Discharge status: Home / Self Care | Payer: 59 | Source: Ambulatory Visit | Attending: Obstetrics and Gynecology | Admitting: Obstetrics and Gynecology

## 2011-09-03 DIAGNOSIS — Z01419 Encounter for gynecological examination (general) (routine) without abnormal findings: Secondary | ICD-10-CM | POA: Insufficient documentation

## 2011-09-03 DIAGNOSIS — N6310 Unspecified lump in the right breast, unspecified quadrant: Secondary | ICD-10-CM

## 2011-09-07 ENCOUNTER — Ambulatory Visit (INDEPENDENT_AMBULATORY_CARE_PROVIDER_SITE_OTHER): Payer: 59 | Admitting: Cardiology

## 2011-09-07 ENCOUNTER — Encounter: Payer: Self-pay | Admitting: Cardiology

## 2011-09-07 DIAGNOSIS — E785 Hyperlipidemia, unspecified: Secondary | ICD-10-CM

## 2011-09-07 DIAGNOSIS — I10 Essential (primary) hypertension: Secondary | ICD-10-CM

## 2011-09-07 DIAGNOSIS — R0789 Other chest pain: Secondary | ICD-10-CM

## 2011-09-07 DIAGNOSIS — M549 Dorsalgia, unspecified: Secondary | ICD-10-CM

## 2011-09-07 DIAGNOSIS — R002 Palpitations: Secondary | ICD-10-CM

## 2011-09-07 NOTE — Assessment & Plan Note (Signed)
HDL level is suboptimal, but criteria for pharmacologic therapy are not met.  Weight loss and exercise, as prescribed by Dr. Lodema Hong, represent the appropriate approach for this young woman with a low risk of vascular disease.

## 2011-09-07 NOTE — Progress Notes (Signed)
Patient ID: Mary Roman, female   DOB: 05/27/1971, 41 y.o.   MRN: [id number] HPI: Scheduled return visit for this very nice woman currently employed full-time at [employer].  Since her last visit, she has done quite well, experiencing a few palpitations, each lasting a matter of seconds, and virtually no chest discomfort.  She notes that stress has decreased since she has been able to concentrate on a single job and that she has dramatically reduced caffeine intake.  She does have some insomnia and was recently prescribed Restoril to use on a prn basis, but has only taken a handful of doses.  She recently began an exercise program and has lost 6 pounds from her peak weight  Prior to Admission medications   Medication Sig Start Date End Date Taking? Authorizing Provider  acetaminophen (TYLENOL) 500 MG tablet Take 500 mg by mouth.     Yes Historical Provider, MD  Etonogestrel (IMPLANON) 68 MG IMPL Inject into the skin. Inserted 08/2009, duration of of use 3 years    Yes Historical Provider, MD  HYDROcodone-acetaminophen (VICODIN) 5-500 MG per tablet Take 1 tablet by mouth at bedtime as needed for pain. 08/27/11  Yes Syliva Overman, MD  Ibuprofen (ADVIL) 200 MG CAPS Take 4 capsules by mouth as needed.   Yes Historical Provider, MD  metoprolol tartrate (LOPRESSOR) 25 MG tablet TAKE ONE-HALF TABLET BY MOUTH TWICE DAILY 04/19/11  Yes Cammie Sickle, MD  temazepam (RESTORIL) 30 MG capsule Take 1 capsule (30 mg total) by mouth at bedtime as needed for sleep. 08/27/11 09/26/11 Yes Syliva Overman, MD  triamterene-hydrochlorothiazide (MAXZIDE-25) 37.5-25 MG per tablet Take 1 each (1 tablet total) by mouth daily. 08/27/11  Yes Syliva Overman, MD    No Known Allergies    Past medical history, social history, and family history reviewed and updated.  ROS: Denies orthopnea, PND, lightheadedness, syncope.  She rarely checks blood pressure at the office, but values obtained by  her gynecologist and family physician has been excellent.  PHYSICAL EXAM: BP 150/86  Pulse 91  Resp 16  Ht 5\' 6"  (1.676 m)  Wt 87.998 kg (194 lb)  BMI 31.31 kg/m2; weight increased by 4 pounds General-Well developed; no acute distress Body habitus-moderately overweight Neck-No JVD; no carotid bruits Lungs-clear lung fields; resonant to percussion Cardiovascular-normal PMI; increased intensity of S1 and S2; grade 1/6 systolic ejection murmur at the cardiac base Abdomen-normal bowel sounds; soft and non-tender without masses or organomegaly Musculoskeletal-No deformities, no cyanosis or clubbing Neurologic-Normal cranial nerves; symmetric strength and tone Skin-Warm, no significant lesions Extremities-distal pulses intact; no edema  ASSESSMENT AND PLAN:  [place] Bing, MD 09/07/2011 1:02 PM

## 2011-09-07 NOTE — Assessment & Plan Note (Signed)
Palpitations have improved dramatically with low dose beta blocker and reduction of caffeine use.  Patient was advised that she could increase metoprolol to 25 mg twice a day if palpitations become troublesome for her.  I will plan to see her again as needed or as requested by Dr. Lodema Hong.

## 2011-09-07 NOTE — Patient Instructions (Signed)
Your physician recommends that you schedule a follow-up appointment in: As needed  

## 2011-09-07 NOTE — Assessment & Plan Note (Signed)
Symptoms have virtually resolved and may have been related to stress.  No further evaluation or treatment is necessary.

## 2011-09-07 NOTE — Assessment & Plan Note (Signed)
Systolic blood pressure is suboptimal at this visit, but has been less than 120 when recently measured in the offices of other physicians.  Patient will monitor occasionally at work and report elevated values, if they occur, to Dr. Lodema Hong.

## 2011-09-26 ENCOUNTER — Ambulatory Visit (HOSPITAL_COMMUNITY)
Admission: RE | Admit: 2011-09-26 | Discharge: 2011-09-26 | Disposition: A | Discharge status: Home / Self Care | Payer: 59 | Source: Ambulatory Visit | Attending: Adult Health | Admitting: Adult Health

## 2011-09-26 ENCOUNTER — Other Ambulatory Visit: Payer: Self-pay | Admitting: Adult Health

## 2011-09-26 DIAGNOSIS — N6310 Unspecified lump in the right breast, unspecified quadrant: Secondary | ICD-10-CM

## 2011-09-26 DIAGNOSIS — N63 Unspecified lump in unspecified breast: Secondary | ICD-10-CM | POA: Insufficient documentation

## 2011-11-04 LAB — HEMOGLOBIN A1C
Hgb A1c MFr Bld: 6 % — ABNORMAL HIGH (ref <5.7)
Mean Plasma Glucose: 126 mg/dL — ABNORMAL HIGH (ref <117)

## 2011-11-05 ENCOUNTER — Ambulatory Visit (INDEPENDENT_AMBULATORY_CARE_PROVIDER_SITE_OTHER): Payer: 59 | Admitting: Family Medicine

## 2011-11-05 ENCOUNTER — Encounter: Payer: Self-pay | Admitting: Family Medicine

## 2011-11-05 VITALS — BP 120/90 | HR 91 | Resp 16 | Ht 66.5 in | Wt 194.4 lb

## 2011-11-05 DIAGNOSIS — R7303 Prediabetes: Secondary | ICD-10-CM

## 2011-11-05 DIAGNOSIS — E669 Obesity, unspecified: Secondary | ICD-10-CM

## 2011-11-05 DIAGNOSIS — R7309 Other abnormal glucose: Secondary | ICD-10-CM

## 2011-11-05 DIAGNOSIS — J309 Allergic rhinitis, unspecified: Secondary | ICD-10-CM

## 2011-11-05 DIAGNOSIS — I10 Essential (primary) hypertension: Secondary | ICD-10-CM

## 2011-11-05 DIAGNOSIS — E785 Hyperlipidemia, unspecified: Secondary | ICD-10-CM

## 2011-11-05 DIAGNOSIS — M549 Dorsalgia, unspecified: Secondary | ICD-10-CM

## 2011-11-05 MED ORDER — TRIAMTERENE-HCTZ 37.5-25 MG PO TABS: 1.0000 | ORAL_TABLET | Freq: Every day | ORAL | Status: DC

## 2011-11-05 NOTE — Assessment & Plan Note (Signed)
Controlled, no change in medication  

## 2011-11-05 NOTE — Assessment & Plan Note (Signed)
Improved. Pt applauded on succesful weight loss through lifestyle change, and encouraged to continue same. Weight loss goal set for the next several months.  

## 2011-11-05 NOTE — Progress Notes (Signed)
  Subjective:    Patient ID: Mary Roman, female    DOB: [birth year], 41 y.o.   MRN: [id number]  HPI The PT is here for follow up and re-evaluation of chronic medical conditions, medication management and review of any available recent lab and radiology data.  Preventive health is updated, specifically  Cancer screening and Immunization.   Questions or concerns regarding consultations or procedures which the PT has had in the interim are  addressed. The PT denies any adverse reactions to current medications since the last visit.  Ongoing chronic back pain unchanged, C/o increased and uncontrolled allergy symptoms with nasal stuffiness and drainage      Review of Systems See HPI Denies recent fever or chills. Denies  ear pain or sore throat. Denies chest congestion, productive cough or wheezing. Denies chest pains, palpitations and leg swelling Denies abdominal pain, nausea, vomiting,diarrhea or constipation.   Denies dysuria, frequency, hesitancy or incontinence. Denies joint pain, swelling and limitation in mobility. Denies headaches, seizures, numbness, or tingling. Denies depression, anxiety or insomnia. Denies skin break down or rash.        Objective:   Physical Exam Patient alert and oriented and in no cardiopulmonary distress.  HEENT: No facial asymmetry, EOMI, no sinus tenderness,  oropharynx pink and moist.  Neck supple no adenopathy.Nasal mucosa erythematous and edematous  Chest: Clear to auscultation bilaterally.  CVS: S1, S2 no murmurs, no S3.  ABD: Soft non tender. Bowel sounds normal.  Ext: No edema  MS: Adequate ROM spine, shoulders, hips and knees.  Skin: Intact, no ulcerations or rash noted.  Psych: Good eye contact, normal affect. Memory intact not anxious or depressed appearing.  CNS: CN 2-12 intact, power, tone and sensation normal throughout.        Assessment & Plan:

## 2011-11-05 NOTE — Assessment & Plan Note (Signed)
Improved with lifestyle change and weight loss pt applauded on this

## 2011-11-05 NOTE — Assessment & Plan Note (Signed)
Unchanged continue current medication. 

## 2011-11-05 NOTE — Patient Instructions (Signed)
F/u in 4.5 month  Congrats on weight loss and improved blood sugar.  Blood pressure is still slightly high, so please cut back on sodium and follow the DASH diet which we will provide. Commitment to daily exercise and weight loss will also help this.  Please call the pharmacy at [facility] and request either claritin or zyrtec for daily use since allergies are worsening.  On days when sinus pressure is excessive , ok to take sudafed one daily.  Please use  saline nasal flushes regularly.  Weight loss goal of 6 to 10 pounds in the next 4.5 month  Fasting lipid, chem 7 and hBA1C in 4.5 month

## 2011-11-05 NOTE — Assessment & Plan Note (Signed)
Uncontrolled and increased during Spring which is expected, she will add flonase and use sudafed as needed

## 2011-11-28 ENCOUNTER — Other Ambulatory Visit: Payer: Self-pay

## 2011-11-28 DIAGNOSIS — I10 Essential (primary) hypertension: Secondary | ICD-10-CM

## 2011-11-28 MED ORDER — TRIAMTERENE-HCTZ 37.5-25 MG PO TABS: 1.0000 | ORAL_TABLET | Freq: Every day | ORAL | Status: DC

## 2012-01-15 ENCOUNTER — Other Ambulatory Visit: Payer: Self-pay | Admitting: Family Medicine

## 2012-02-12 ENCOUNTER — Other Ambulatory Visit: Payer: Self-pay | Admitting: *Deleted

## 2012-02-12 MED ORDER — METOPROLOL TARTRATE 25 MG PO TABS: 12.5000 mg | ORAL_TABLET | Freq: Two times a day (BID) | ORAL | Status: DC

## 2012-02-20 ENCOUNTER — Telehealth: Payer: Self-pay | Admitting: Family Medicine

## 2012-02-20 MED ORDER — ACYCLOVIR 200 MG PO CAPS: ORAL_CAPSULE | ORAL | Status: AC

## 2012-02-20 NOTE — Telephone Encounter (Signed)
3 day h/o "cold sore"   Will send med to [facility], she is aware

## 2012-03-10 ENCOUNTER — Ambulatory Visit: Payer: 59 | Admitting: Family Medicine

## 2012-04-03 IMAGING — CR DG CHEST 2V
2 series · 2 of 2 positions shown · non-contrast
Comparison: 05/18/2010 and earlier.

CLINICAL DATA: 39-year-old female with chest pain, shortness of
breath, intermittent left shoulder pain.

CHEST - 2 VIEW

[view not recorded (1 of 2)]
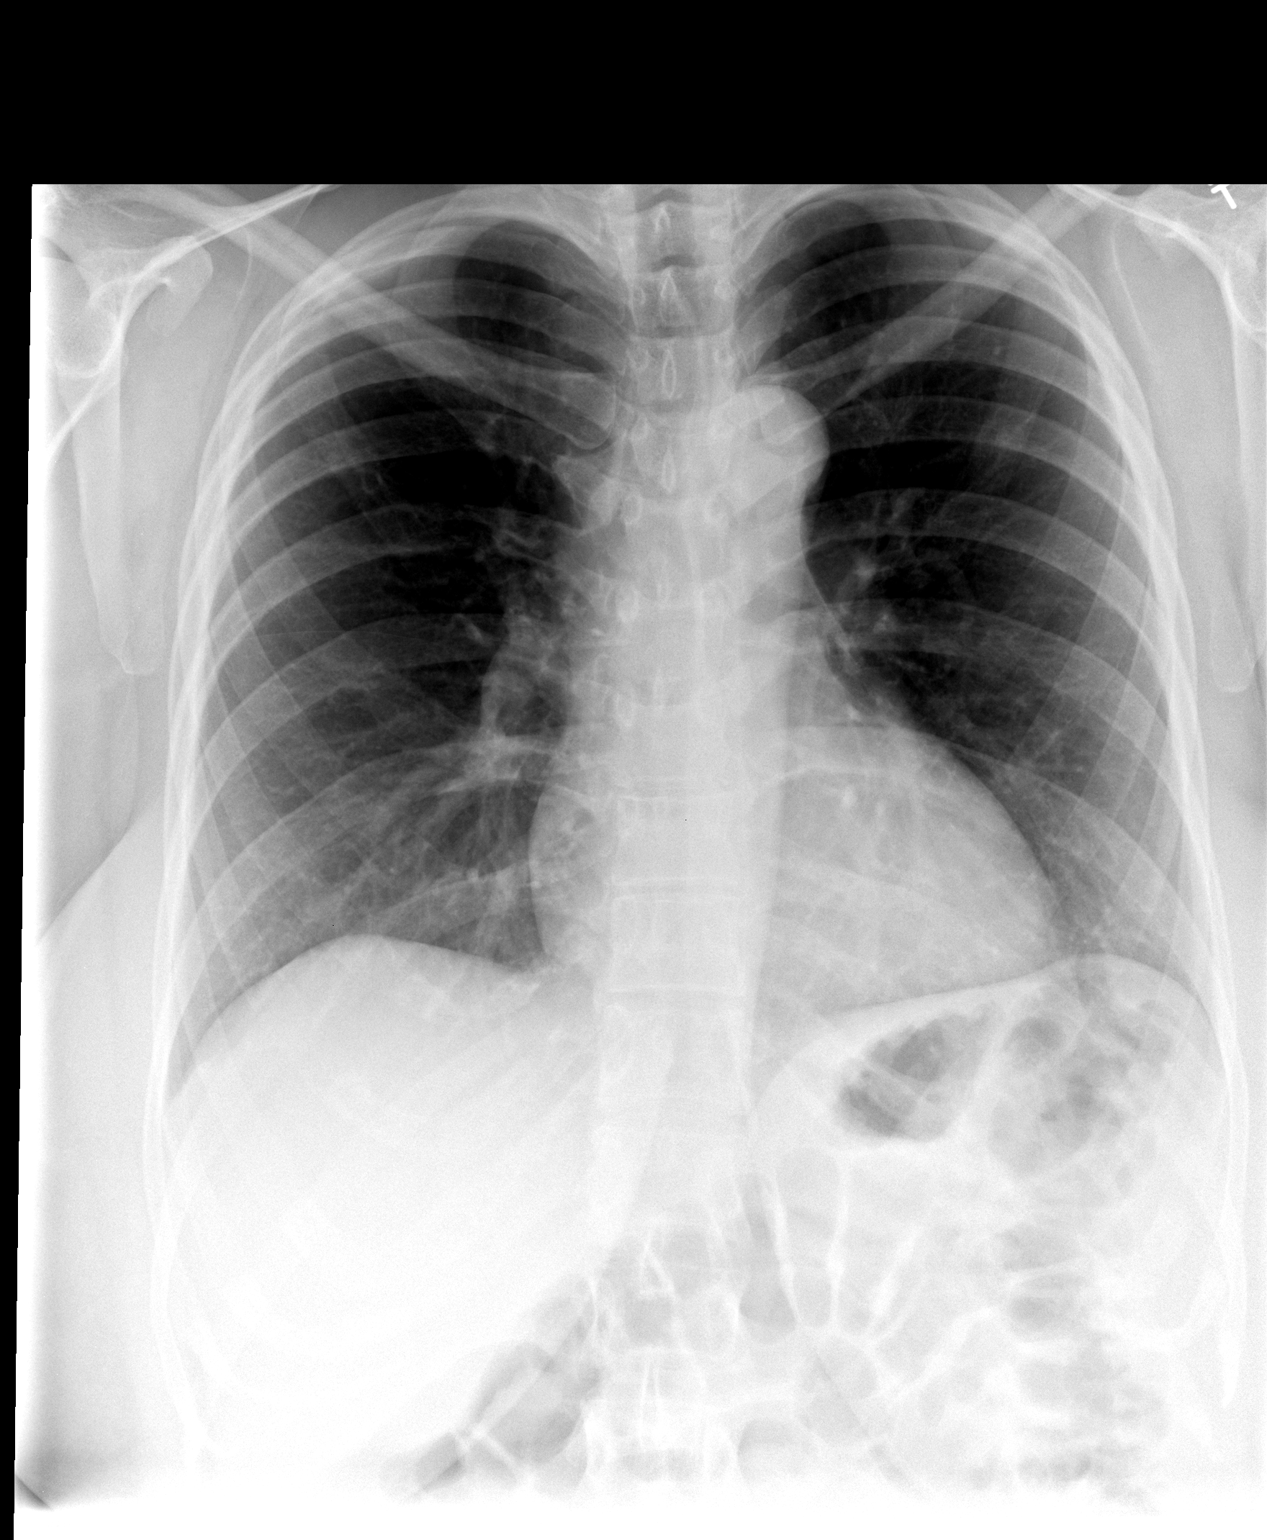

[view not recorded (2 of 2)]
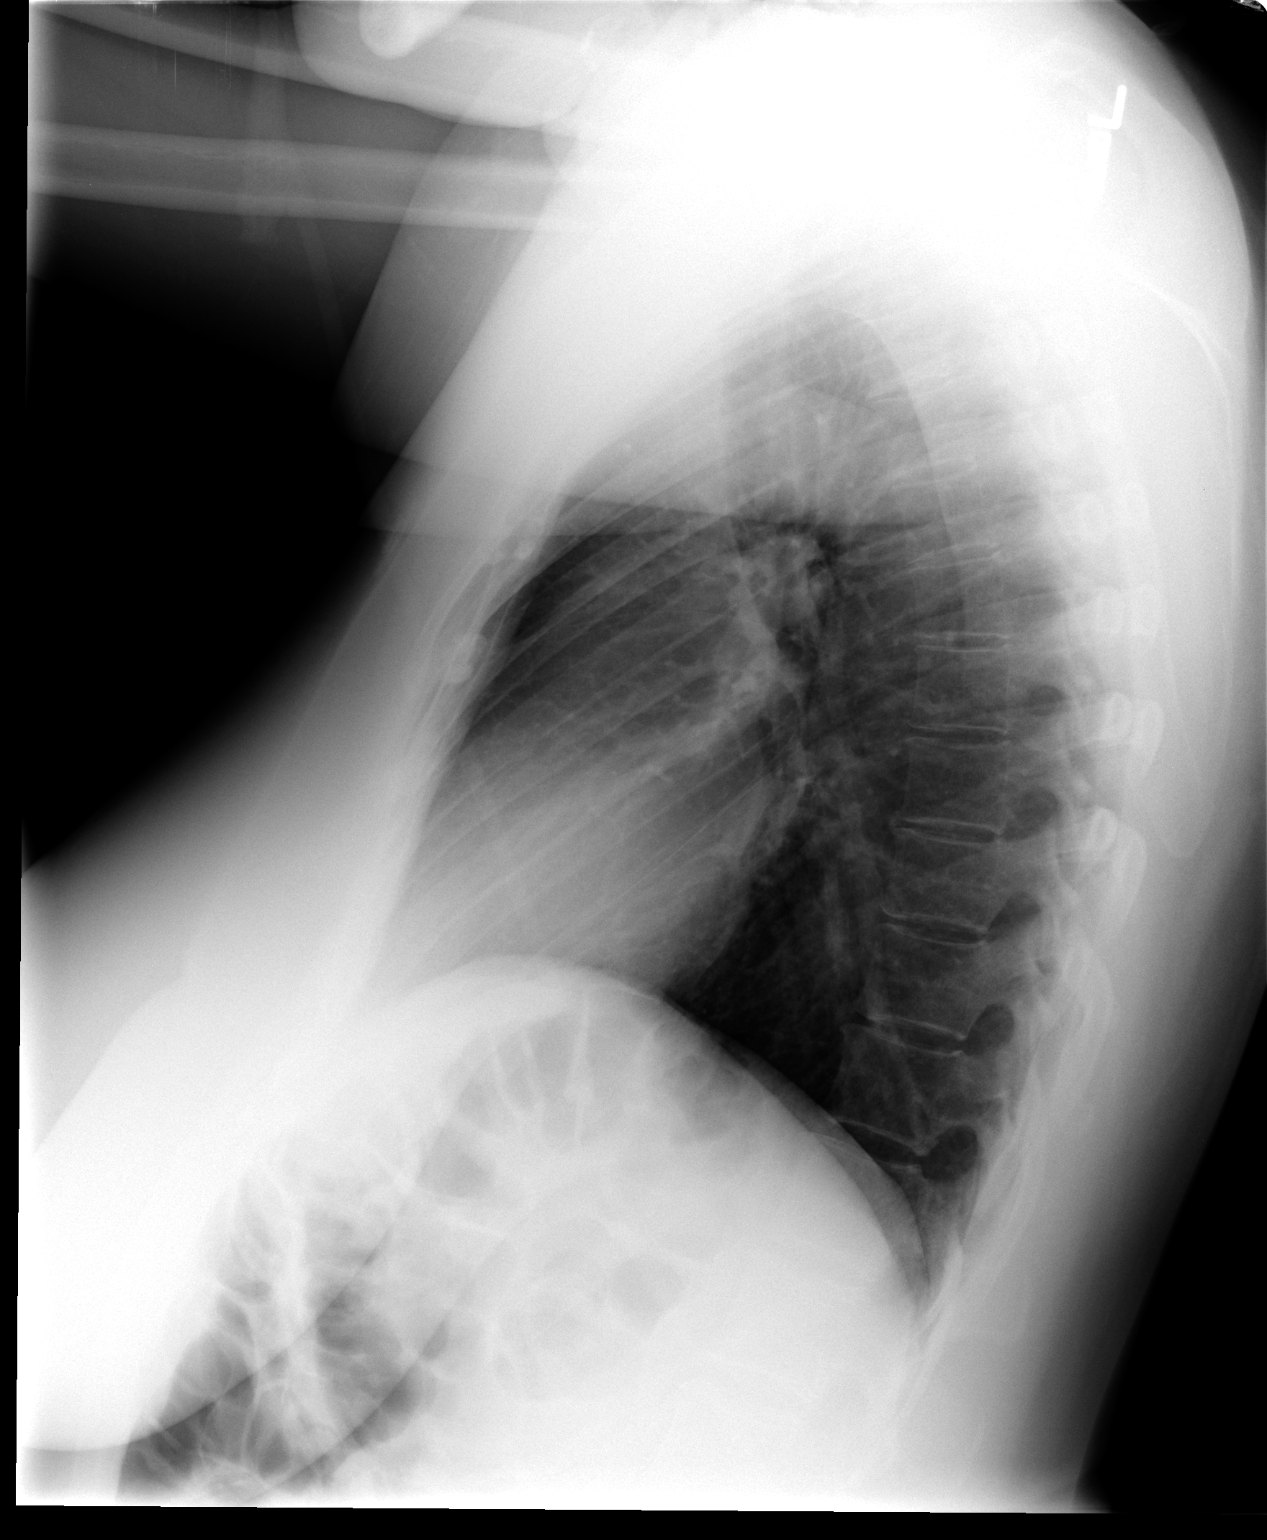

[2 of 2 positions shown; findings below may reference images not displayed]

FINDINGS: Stable, normal lung volumes.  Cardiac size and
mediastinal contours are within normal limits.  Visualized tracheal
air column is within normal limits.  The lungs are clear.  No
pneumothorax or effusion. No acute osseous abnormality identified.
IMPRESSION: Stable and negative, no acute cardiopulmonary abnormality.

## 2012-04-16 ENCOUNTER — Other Ambulatory Visit: Payer: Self-pay

## 2012-04-16 MED ORDER — HYDROCODONE-ACETAMINOPHEN 5-500 MG PO TABS: ORAL_TABLET | ORAL | Status: DC

## 2012-04-25 ENCOUNTER — Other Ambulatory Visit: Payer: Self-pay

## 2012-04-25 MED ORDER — HYDROCODONE-ACETAMINOPHEN 5-500 MG PO TABS: ORAL_TABLET | ORAL | Status: DC

## 2012-04-28 ENCOUNTER — Other Ambulatory Visit: Payer: Self-pay | Admitting: Family Medicine

## 2012-04-28 ENCOUNTER — Ambulatory Visit (INDEPENDENT_AMBULATORY_CARE_PROVIDER_SITE_OTHER): Payer: 59 | Admitting: Family Medicine

## 2012-04-28 ENCOUNTER — Encounter: Payer: Self-pay | Admitting: Family Medicine

## 2012-04-28 VITALS — BP 128/90 | HR 102 | Resp 15 | Ht 66.5 in | Wt 207.0 lb

## 2012-04-28 DIAGNOSIS — R7309 Other abnormal glucose: Secondary | ICD-10-CM

## 2012-04-28 DIAGNOSIS — G4700 Insomnia, unspecified: Secondary | ICD-10-CM

## 2012-04-28 DIAGNOSIS — E669 Obesity, unspecified: Secondary | ICD-10-CM

## 2012-04-28 DIAGNOSIS — M549 Dorsalgia, unspecified: Secondary | ICD-10-CM

## 2012-04-28 DIAGNOSIS — F329 Major depressive disorder, single episode, unspecified: Secondary | ICD-10-CM

## 2012-04-28 DIAGNOSIS — R7303 Prediabetes: Secondary | ICD-10-CM

## 2012-04-28 DIAGNOSIS — F32A Depression, unspecified: Secondary | ICD-10-CM | POA: Insufficient documentation

## 2012-04-28 DIAGNOSIS — F411 Generalized anxiety disorder: Secondary | ICD-10-CM

## 2012-04-28 DIAGNOSIS — F3289 Other specified depressive episodes: Secondary | ICD-10-CM

## 2012-04-28 DIAGNOSIS — I10 Essential (primary) hypertension: Secondary | ICD-10-CM

## 2012-04-28 MED ORDER — FLUOXETINE HCL 10 MG PO CAPS: 10.0000 mg | ORAL_CAPSULE | Freq: Every day | ORAL | Status: DC

## 2012-04-28 MED ORDER — TEMAZEPAM 30 MG PO CAPS: 30.0000 mg | ORAL_CAPSULE | Freq: Every evening | ORAL | Status: DC | PRN

## 2012-04-28 NOTE — Patient Instructions (Signed)
F/u early November.  We will call with labs if very abnormal  New medications for depression and insomnia  Please practice good sleep hygiene  Please commit to daily physical activity   Please commit to eliminating unhealthy food and limiting caloric intake  Please try and get help for your Mom  Please commit to weekly "fun activity"

## 2012-04-28 NOTE — Progress Notes (Signed)
  Subjective:    Patient ID: Mary Roman, female    DOB: Aug 06, 1970, 41 y.o.   MRN: [id number]  HPI The PT is here for follow up and re-evaluation of chronic medical conditions, medication management and review of any available recent lab and radiology data.  Preventive health is updated, specifically  Cancer screening and Immunization.   Questions or concerns regarding consultations or procedures which the PT has had in the interim are  addressed. The PT denies any adverse reactions to current medications since the last visit.  6 month h/o increased stress due to the need to care for sick Mom and mom in law.  Overwhelmed, poor sleep, withdrawn, no exercise, over eating and weight gain. Not suicidal or homicidal, no hallucinations    Review of Systems See HPI Denies recent fever or chills. Denies sinus pressure, nasal congestion, ear pain or sore throat. Denies chest congestion, productive cough or wheezing. Denies chest pains, palpitations and leg swelling Denies abdominal pain, nausea, vomiting,diarrhea or constipation.   Denies dysuria, frequency, hesitancy or incontinence. Denies joint pain, swelling and limitation in mobility. Denies headaches, seizures, numbness, or tingling. . Denies skin break down or rash.        Objective:   Physical Exam  Patient alert and oriented and in no cardiopulmonary distress.  HEENT: No facial asymmetry, EOMI, no sinus tenderness,  oropharynx pink and moist.  Neck supple no adenopathy.  Chest: Clear to auscultation bilaterally.  CVS: S1, S2 no murmurs, no S3.  ABD: Soft non tender. Bowel sounds normal.  Ext: No edema  MS: Adequate ROM spine, shoulders, hips and knees.  Skin: Intact, no ulcerations or rash noted.  Psych: Poor  eye contact,flat  affect. Memory intact  anxious and  depressed appearing.  CNS: CN 2-12 intact, power, tone and sensation normal throughout.       Assessment & Plan:

## 2012-04-28 NOTE — Assessment & Plan Note (Signed)
Uncontrolled, weight gain, increased sodium, poor eating and lack of exercise. No med change

## 2012-04-28 NOTE — Assessment & Plan Note (Signed)
Deteriorated. Patient re-educated about  the importance of commitment to a  minimum of 150 minutes of exercise per week. The importance of healthy food choices with portion control discussed. Encouraged to start a food diary, count calories and to consider  joining a support group. Sample diet sheets offered. Goals set by the patient for the next several months.    

## 2012-04-28 NOTE — Assessment & Plan Note (Signed)
Increased with weight gain, no med change

## 2012-04-28 NOTE — Assessment & Plan Note (Signed)
Anticipate worsened HBA1C based on poor lifestyle with weight gain, will f/u on lab

## 2012-04-28 NOTE — Assessment & Plan Note (Signed)
Increased due to social situation. start daily exercise and medication

## 2012-04-28 NOTE — Assessment & Plan Note (Signed)
Increased x 6 month, due to increased anxiety with the responsibility of helping to care for 2 sick parents

## 2012-04-29 ENCOUNTER — Other Ambulatory Visit: Payer: Self-pay | Admitting: Family Medicine

## 2012-04-29 LAB — BASIC METABOLIC PANEL
BUN: 11 mg/dL (ref 6–23)
Calcium: 9.6 mg/dL (ref 8.4–10.5)
Glucose, Bld: 115 mg/dL — ABNORMAL HIGH (ref 70–99)
Sodium: 139 mEq/L (ref 135–145)

## 2012-04-29 LAB — HEMOGLOBIN A1C: Mean Plasma Glucose: 143 mg/dL — ABNORMAL HIGH (ref <117)

## 2012-04-29 LAB — LIPID PANEL
Cholesterol: 177 mg/dL (ref 0–200)
Triglycerides: 64 mg/dL (ref <150)

## 2012-04-30 ENCOUNTER — Other Ambulatory Visit: Payer: Self-pay

## 2012-04-30 MED ORDER — METFORMIN HCL ER 500 MG PO TB24: 500.0000 mg | ORAL_TABLET | Freq: Every day | ORAL | Status: DC

## 2012-05-02 ENCOUNTER — Telehealth (HOSPITAL_COMMUNITY): Payer: Self-pay | Admitting: Dietician

## 2012-05-02 NOTE — Telephone Encounter (Signed)
Noted that pt was referred for dx: obesity on 05/28/12. Appointment was scheduled for 06/13/11, but pt cancelled appointment on 06/12/12 due to work commitments and never rescheduled.

## 2012-05-02 NOTE — Telephone Encounter (Signed)
Received referral via fax from Dr. Simpson for dx: diabetes.  

## 2012-05-02 NOTE — Telephone Encounter (Signed)
Appointment scheduled for 05/14/12 at 11:30 AM.

## 2012-05-08 ENCOUNTER — Telehealth (HOSPITAL_COMMUNITY): Payer: Self-pay | Admitting: Dietician

## 2012-05-08 NOTE — Telephone Encounter (Signed)
Mailed appointment confirmation letter and instructions for appointment scheduled 05/14/12 at 11:30 AM via [place] [employer].

## 2012-05-14 ENCOUNTER — Telehealth (HOSPITAL_COMMUNITY): Payer: Self-pay | Admitting: Dietician

## 2012-05-14 NOTE — Telephone Encounter (Signed)
Pt requesting rescheduling appointment today at 11:30 AM due to sickness (allergies and sinus infection). Appointment rescheduled for 05/26/12 at 12:00 PM.

## 2012-05-22 ENCOUNTER — Telehealth (HOSPITAL_COMMUNITY): Payer: Self-pay | Admitting: Dietician

## 2012-05-22 ENCOUNTER — Ambulatory Visit (HOSPITAL_COMMUNITY)
Admission: RE | Admit: 2012-05-22 | Discharge: 2012-05-22 | Disposition: A | Discharge status: Home / Self Care | Payer: 59 | Source: Ambulatory Visit | Attending: Family Medicine | Admitting: Family Medicine

## 2012-05-22 ENCOUNTER — Telehealth: Payer: Self-pay | Admitting: Family Medicine

## 2012-05-22 DIAGNOSIS — J069 Acute upper respiratory infection, unspecified: Secondary | ICD-10-CM

## 2012-05-22 DIAGNOSIS — R05 Cough: Secondary | ICD-10-CM

## 2012-05-22 DIAGNOSIS — R059 Cough, unspecified: Secondary | ICD-10-CM

## 2012-05-22 MED ORDER — LEVOFLOXACIN 500 MG PO TABS: 500.0000 mg | ORAL_TABLET | Freq: Every day | ORAL | Status: AC

## 2012-05-22 MED ORDER — GUAIFENESIN-CODEINE 100-10 MG/5ML PO SYRP: 5.0000 mL | ORAL_SOLUTION | Freq: Three times a day (TID) | ORAL | Status: DC | PRN

## 2012-05-22 NOTE — Telephone Encounter (Signed)
I spoke with patient. She is having persistent cough with production fatigue and body aches. She was given Augmentin and prednisone by ear nose and throat when her symptoms started with a sore throat. She has worsened been on the medication. She still has 3 days left of Augmentin however has not seen any change. Chest x-ray was obtained which shows a mild infiltrative process in the lingula. If she is not improving I will change her antibiotics to Levaquin to cover for possible pneumonia. I have also prescribed her Robitussin a.c. I've advised her to remain out of work she will try to see she gets one to cover her shift. Rest and fluids

## 2012-05-22 NOTE — Telephone Encounter (Signed)
Pt aware.

## 2012-05-22 NOTE — Telephone Encounter (Signed)
Mailed appointment confirmation letter and instructions for appointment scheduled 05/26/12 at 12:00 PM via [place] [employer].

## 2012-05-22 NOTE — Telephone Encounter (Signed)
She can get chest xray Also add mucinex for the congestion in chest , plenty of water

## 2012-05-22 NOTE — Telephone Encounter (Signed)
Has been sick since last Thursday. Dr Suszanne Conners gave her augmentin and prednisone but it is in her chest and she has a bad cough. No fever or any other symptoms- just very congested in chest and unable to cough up the phlegm. Wants to know if she can get a chest xray or if she still has to come in here first. Today is her first day back at work.

## 2012-05-26 ENCOUNTER — Telehealth (HOSPITAL_COMMUNITY): Payer: Self-pay | Admitting: Dietician

## 2012-05-26 NOTE — Telephone Encounter (Signed)
Received message from pt at 8:30 AM. Pt was scheduled for appointment today at 12:00 PM. She reports she will be unable to make it due to being under the weather and continuing to feel poorly since her previously scheduled appointment (which was also a same day cancellation). She reports she will call back and reschedule once she is feeling better. Will keep referral on file.

## 2012-05-27 ENCOUNTER — Other Ambulatory Visit: Payer: Self-pay | Admitting: Family Medicine

## 2012-06-04 ENCOUNTER — Encounter: Payer: Self-pay | Admitting: Family Medicine

## 2012-06-04 ENCOUNTER — Ambulatory Visit (INDEPENDENT_AMBULATORY_CARE_PROVIDER_SITE_OTHER): Payer: 59 | Admitting: Family Medicine

## 2012-06-04 VITALS — BP 120/90 | HR 93 | Temp 97.9°F | Resp 15 | Wt 196.1 lb

## 2012-06-04 DIAGNOSIS — J40 Bronchitis, not specified as acute or chronic: Secondary | ICD-10-CM

## 2012-06-04 DIAGNOSIS — I10 Essential (primary) hypertension: Secondary | ICD-10-CM

## 2012-06-04 DIAGNOSIS — E669 Obesity, unspecified: Secondary | ICD-10-CM

## 2012-06-04 DIAGNOSIS — F329 Major depressive disorder, single episode, unspecified: Secondary | ICD-10-CM

## 2012-06-04 DIAGNOSIS — R7303 Prediabetes: Secondary | ICD-10-CM

## 2012-06-04 DIAGNOSIS — R7309 Other abnormal glucose: Secondary | ICD-10-CM

## 2012-06-04 DIAGNOSIS — F3289 Other specified depressive episodes: Secondary | ICD-10-CM

## 2012-06-04 DIAGNOSIS — F411 Generalized anxiety disorder: Secondary | ICD-10-CM

## 2012-06-04 DIAGNOSIS — M549 Dorsalgia, unspecified: Secondary | ICD-10-CM

## 2012-06-04 DIAGNOSIS — F32A Depression, unspecified: Secondary | ICD-10-CM

## 2012-06-04 MED ORDER — FLUOXETINE HCL 20 MG PO CAPS: 20.0000 mg | ORAL_CAPSULE | Freq: Every day | ORAL | Status: DC

## 2012-06-04 MED ORDER — BENZONATATE 100 MG PO CAPS: 100.0000 mg | ORAL_CAPSULE | Freq: Four times a day (QID) | ORAL | Status: DC | PRN

## 2012-06-04 NOTE — Progress Notes (Signed)
  Subjective:    Patient ID: Mary Roman, female    DOB: Jul 18, 1971, 41 y.o.   MRN: [id number]  HPI  3 week h/o chest congestion and cough with abnormal CXR. Has had both augmentin and levaquin, starting to feel better but still has a lot of chest congestion, never had fever or chills. Needs to work more consistently on weight loss Has not  Noted significant improvement in depression symptoms. though getting her Mom more organized, not suicidal or homicidal  Review of Systems See HPI Denies recent fever or chills. Denies sinus pressure, nasal congestion, ear pain or sore throat. Ongoing cough and chest congestion, though improved Denies chest pains, palpitations and leg swelling Denies abdominal pain, nausea, vomiting,diarrhea or constipation.   Denies dysuria, frequency, hesitancy or incontinence. Denies joint pain, swelling and limitation in mobility. Denies headaches, seizures, numbness, or tingling.  Denies skin break down or rash.        Objective:   Physical Exam  Patient alert and oriented and in no cardiopulmonary distress.  HEENT: No facial asymmetry, EOMI, no sinus tenderness,  oropharynx pink and moist.  Neck supple no adenopathy.  Chest: Adequate air entry scattered crackles, no wheezes  CVS: S1, S2 no murmurs, no S3.  ABD: Soft non tender. Bowel sounds normal.  Ext: No edema  MS: Adequate ROM spine, shoulders, hips and knees.  Skin: Intact, no ulcerations or rash noted.  Psych: Good eye contact, normal affect. Memory intact not anxious or depressed appearing.  CNS: CN 2-12 intact, power, tone and sensation normal throughout.       Assessment & Plan:

## 2012-06-04 NOTE — Patient Instructions (Addendum)
F/U end December or early January.  Rept CXR  12/16 or after  Additional decongestant capsules sent in, drink a lot of water also  Commit to 30 minutes daily of physical activity  Dose increase in fluoxetine to 20mg  daily start today, OK to take two 10mg  capsules daily till done  HBA1C and chem 7 fasting 3 days before f/u

## 2012-06-05 NOTE — Assessment & Plan Note (Signed)
Unchanged , no med change 

## 2012-06-05 NOTE — Assessment & Plan Note (Addendum)
Uncontrolled, no change in medication DASH diet and commitment to daily physical activity for a minimum of 30 minutes discussed and encouraged, as a part of hypertension management. The importance of attaining a healthy weight is also discussed.  

## 2012-06-05 NOTE — Assessment & Plan Note (Signed)
  Pt applauded on succesful weight loss through lifestyle change, and encouraged to continue same. Weight loss goal set for the next several months.

## 2012-06-05 NOTE — Assessment & Plan Note (Signed)
Persistent cough and chest congestion with abnormal CXR, rept CXR in 4 weeks. Script provided for [employer]

## 2012-06-05 NOTE — Assessment & Plan Note (Signed)
Slight improvement with prozac, increase dose to 20mg  daily Pt also encouraged to commit to daily exercise, has been ill recently

## 2012-06-05 NOTE — Assessment & Plan Note (Signed)
Improved, dose increase in prozac

## 2012-06-16 ENCOUNTER — Telehealth: Payer: Self-pay | Admitting: Family Medicine

## 2012-06-16 MED ORDER — HYDROCODONE-ACETAMINOPHEN 5-500 MG PO TABS: ORAL_TABLET | ORAL | Status: DC

## 2012-06-16 NOTE — Telephone Encounter (Signed)
Needs hydrocodone sent in

## 2012-08-04 ENCOUNTER — Ambulatory Visit: Payer: 59 | Admitting: Family Medicine

## 2012-08-13 ENCOUNTER — Ambulatory Visit: Payer: 59 | Admitting: Family Medicine

## 2012-08-20 ENCOUNTER — Other Ambulatory Visit: Payer: Self-pay

## 2012-08-20 MED ORDER — HYDROCODONE-ACETAMINOPHEN 5-325 MG PO TABS: 1.0000 | ORAL_TABLET | Freq: Every day | ORAL | Status: DC

## 2012-08-25 ENCOUNTER — Ambulatory Visit: Payer: 59 | Admitting: Family Medicine

## 2012-08-26 ENCOUNTER — Other Ambulatory Visit: Payer: Self-pay | Admitting: Family Medicine

## 2012-08-29 ENCOUNTER — Other Ambulatory Visit: Payer: Self-pay | Admitting: Family Medicine

## 2012-09-15 ENCOUNTER — Ambulatory Visit (INDEPENDENT_AMBULATORY_CARE_PROVIDER_SITE_OTHER): Payer: 59 | Admitting: Family Medicine

## 2012-09-15 ENCOUNTER — Encounter: Payer: Self-pay | Admitting: Family Medicine

## 2012-09-15 VITALS — BP 130/80 | HR 83 | Resp 16 | Ht 66.5 in | Wt 208.4 lb

## 2012-09-15 DIAGNOSIS — E785 Hyperlipidemia, unspecified: Secondary | ICD-10-CM

## 2012-09-15 DIAGNOSIS — I10 Essential (primary) hypertension: Secondary | ICD-10-CM

## 2012-09-15 DIAGNOSIS — F32A Depression, unspecified: Secondary | ICD-10-CM

## 2012-09-15 DIAGNOSIS — G4700 Insomnia, unspecified: Secondary | ICD-10-CM

## 2012-09-15 DIAGNOSIS — R5381 Other malaise: Secondary | ICD-10-CM

## 2012-09-15 DIAGNOSIS — R7309 Other abnormal glucose: Secondary | ICD-10-CM

## 2012-09-15 DIAGNOSIS — Z1329 Encounter for screening for other suspected endocrine disorder: Secondary | ICD-10-CM

## 2012-09-15 DIAGNOSIS — Z1321 Encounter for screening for nutritional disorder: Secondary | ICD-10-CM

## 2012-09-15 DIAGNOSIS — R7303 Prediabetes: Secondary | ICD-10-CM

## 2012-09-15 DIAGNOSIS — Z130 Encounter for screening for diseases of the blood and blood-forming organs and certain disorders involving the immune mechanism: Secondary | ICD-10-CM

## 2012-09-15 DIAGNOSIS — E669 Obesity, unspecified: Secondary | ICD-10-CM

## 2012-09-15 DIAGNOSIS — F3289 Other specified depressive episodes: Secondary | ICD-10-CM

## 2012-09-15 DIAGNOSIS — F411 Generalized anxiety disorder: Secondary | ICD-10-CM

## 2012-09-15 DIAGNOSIS — F329 Major depressive disorder, single episode, unspecified: Secondary | ICD-10-CM

## 2012-09-15 DIAGNOSIS — M549 Dorsalgia, unspecified: Secondary | ICD-10-CM

## 2012-09-15 MED ORDER — TEMAZEPAM 30 MG PO CAPS: ORAL_CAPSULE | ORAL | Status: DC

## 2012-09-15 NOTE — Assessment & Plan Note (Signed)
Unchanged, continue med as before

## 2012-09-15 NOTE — Assessment & Plan Note (Signed)
Patient educated about the importance of limiting  Carbohydrate intake , the need to commit to daily physical activity for a minimum of 30 minutes , and to commit weight loss. The fact that changes in all these areas will reduce or eliminate all together the development of diabetes is stressed.   Updated lab needed 

## 2012-09-15 NOTE — Assessment & Plan Note (Signed)
Improved markedly with nmedication

## 2012-09-15 NOTE — Progress Notes (Signed)
  Subjective:    Patient ID: Mary Roman, female    DOB: Nov 27, 1970, 42 y.o.   MRN: [id number]  HPI  The PT is here for follow up and re-evaluation of chronic medical conditions, medication management and review of any available recent lab and radiology data.  Preventive health is updated, specifically  Cancer screening and Immunization.   . The PT denies any adverse reactions to current medications since the last visit.  There are no new concerns. Her mother unfortunately has required hospitalization again since her last visit, however, the pt and her siblings are working together to take best possible care of her.  Pt states she has not been able exercise as she should, and in stress she has been overindulging in cookies at home, she has actually gained weight also  depression is well controlled on current med dose and her overall wellbeing is better     Review of Systems See HPI Denies recent fever or chills. Denies sinus pressure, nasal congestion, ear pain or sore throat. Denies chest congestion, productive cough or wheezing. Denies chest pains, palpitations and leg swelling Denies abdominal pain, nausea, vomiting,diarrhea or constipation.   Denies dysuria, frequency, hesitancy or incontinence. Denies joint pain, swelling and limitation in mobility. Denies headaches, seizures, numbness, or tingling. Denies depression, anxiety or insomnia. Denies skin break down or rash.        Objective:   Physical Exam  Patient alert and oriented and in no cardiopulmonary distress.  HEENT: No facial asymmetry, EOMI, no sinus tenderness,  oropharynx pink and moist.  Neck supple no adenopathy.  Chest: Clear to auscultation bilaterally.  CVS: S1, S2 no murmurs, no S3.  ABD: Soft non tender. Bowel sounds normal.  Ext: No edema  MS: Adequate ROM spine, shoulders, hips and knees.  Skin: Intact, no ulcerations or rash noted.  Psych: Good eye contact, normal affect. Memory  intact not anxious or depressed appearing.  CNS: CN 2-12 intact, power, tone and sensation normal throughout.       Assessment & Plan:

## 2012-09-15 NOTE — Assessment & Plan Note (Signed)
Deteriorated. Patient re-educated about  the importance of commitment to a  minimum of 150 minutes of exercise per week. The importance of healthy food choices with portion control discussed. Encouraged to start a food diary, count calories and to consider  joining a support group. Sample diet sheets offered. Goals set by the patient for the next several months.    

## 2012-09-15 NOTE — Assessment & Plan Note (Addendum)
Improved, continue current med dose, rescore next visit

## 2012-09-15 NOTE — Assessment & Plan Note (Signed)
Sleep hygiene reviewed, continue current med 

## 2012-09-15 NOTE — Assessment & Plan Note (Signed)
Controlled, no change in medication DASH diet and commitment to daily physical activity for a minimum of 30 minutes discussed and encouraged, as a part of hypertension management. The importance of attaining a healthy weight is also discussed.  

## 2012-09-15 NOTE — Assessment & Plan Note (Signed)
Uncontrolled and deteriorated, updated lab needed. Hyperlipidemia:Low fat diet discussed and encouraged.

## 2012-09-15 NOTE — Patient Instructions (Addendum)
F/u in 4 month, pls call if you need me before  Pls order multivitamin one daily from the pharmacy.  Blood pressure slightly high today, commitment to 30 minutes daily of physical activity as well as increase fruit and vegetable and reduced sodium will improve the blood pressure, also weight loss.  Fasting labs asap, Lipid, chem 7 and EGFR, HBA1C, TSH , vit D, cbc  Pls schedule your mammogram  Weigth loss goal of 8 to 10 pounds  No med changes   1500 cal diet sheet proveded  Please log into "my chart" to see lab results, call with questions

## 2012-09-22 ENCOUNTER — Other Ambulatory Visit: Payer: Self-pay | Admitting: Family Medicine

## 2012-09-22 LAB — BASIC METABOLIC PANEL
CO2: 28 mEq/L (ref 19–32)
Calcium: 8.7 mg/dL (ref 8.4–10.5)
Chloride: 105 mEq/L (ref 96–112)
Glucose, Bld: 121 mg/dL — ABNORMAL HIGH (ref 70–99)
Sodium: 141 mEq/L (ref 135–145)

## 2012-09-22 LAB — CBC WITH DIFFERENTIAL/PLATELET
Basophils Absolute: 0 10*3/uL (ref 0.0–0.1)
Lymphocytes Relative: 39 % (ref 12–46)
Lymphs Abs: 2.5 10*3/uL (ref 0.7–4.0)
Neutro Abs: 3.1 10*3/uL (ref 1.7–7.7)
Neutrophils Relative %: 49 % (ref 43–77)
Platelets: 495 10*3/uL — ABNORMAL HIGH (ref 150–400)
RBC: 4.1 MIL/uL (ref 3.87–5.11)
RDW: 13.8 % (ref 11.5–15.5)
WBC: 6.3 10*3/uL (ref 4.0–10.5)

## 2012-09-22 LAB — TSH: TSH: 1.618 u[IU]/mL (ref 0.350–4.500)

## 2012-09-23 LAB — VITAMIN D 25 HYDROXY (VIT D DEFICIENCY, FRACTURES): Vit D, 25-Hydroxy: <10 ng/mL — ABNORMAL LOW (ref 30–89)

## 2012-09-24 ENCOUNTER — Other Ambulatory Visit: Payer: Self-pay | Admitting: Family Medicine

## 2012-09-24 ENCOUNTER — Telehealth: Payer: Self-pay | Admitting: Family Medicine

## 2012-09-24 LAB — LIPID PANEL
Cholesterol: 157 mg/dL (ref 0–200)
LDL Cholesterol: 104 mg/dL — ABNORMAL HIGH (ref 0–99)
Triglycerides: 54 mg/dL (ref <150)

## 2012-09-24 LAB — IRON AND TIBC
Iron: 115 ug/dL (ref 42–145)
TIBC: 379 ug/dL (ref 250–470)
UIBC: 264 ug/dL (ref 125–400)

## 2012-09-24 LAB — CREATININE, SERUM: Creat: 1.33 mg/dL — ABNORMAL HIGH (ref 0.50–1.10)

## 2012-09-24 NOTE — Telephone Encounter (Signed)
Pls let pt know Blood sugar better now 6.3 was 6.6 Cholesterol better, bad cholesterol slightly elevated at 104, thyroid function is normal Needs to take 1 multivitamin once daily, can call [facility] and get OTC multivitamin at good cost

## 2012-09-26 NOTE — Telephone Encounter (Signed)
Patient aware.

## 2012-10-06 ENCOUNTER — Other Ambulatory Visit: Payer: Self-pay | Admitting: Family Medicine

## 2012-10-23 ENCOUNTER — Other Ambulatory Visit: Payer: Self-pay

## 2012-10-23 DIAGNOSIS — F32A Depression, unspecified: Secondary | ICD-10-CM

## 2012-10-23 DIAGNOSIS — F329 Major depressive disorder, single episode, unspecified: Secondary | ICD-10-CM

## 2012-10-23 MED ORDER — FLUOXETINE HCL 20 MG PO CAPS: 20.0000 mg | ORAL_CAPSULE | Freq: Every day | ORAL | Status: DC

## 2012-10-29 ENCOUNTER — Telehealth (HOSPITAL_COMMUNITY): Payer: Self-pay | Admitting: Dietician

## 2012-10-29 NOTE — Telephone Encounter (Signed)
Pt left voicemail at 1031. Called back at 1506 and left message on voicemail.

## 2012-11-03 NOTE — Telephone Encounter (Signed)
Pt did not return call. 

## 2012-11-13 ENCOUNTER — Other Ambulatory Visit: Payer: Self-pay

## 2012-11-13 MED ORDER — HYDROCODONE-ACETAMINOPHEN 5-325 MG PO TABS: 1.0000 | ORAL_TABLET | Freq: Every day | ORAL | Status: DC

## 2012-11-20 ENCOUNTER — Telehealth (HOSPITAL_COMMUNITY): Payer: Self-pay | Admitting: Dietician

## 2012-11-20 NOTE — Telephone Encounter (Signed)
Received voicemail from pt left at 0829. Called back at 1044. Appointment scheduled for 4/28 at 1200.

## 2012-11-24 ENCOUNTER — Encounter (HOSPITAL_COMMUNITY): Payer: Self-pay | Admitting: Dietician

## 2012-11-24 NOTE — Progress Notes (Signed)
Outpatient Initial Nutrition Assessment  Date:11/24/2012   Appt Start Time: 1159  Referring Physician: Dr. Lodema Hong Reason for Visit: pre-diabetes  Nutrition Assessment:  Height: 5' 5.5" (166.4 cm)   Weight: 216 lb (97.977 kg)   IBW: 128# %IBW: 169% UBW: 170# %UBW: 127%  Body mass index is 35.38 kg/(m^2). Classified as obesity, class II.  Goal Weight: 194# (10% of current body weight) Weight hx: Pt reports UBW of 170#, which she maintained up until 5-6 years ago. She reports that her weight has gradually increased over the past 6 years. Noted pt has had a 3.8% wt gain x 2 months.   Estimated nutritional needs: 1600-1700 kcals daily, 79-98 grams protein daily, 1.6-1.7 L fluid daily  PMH:  Past Medical History  Diagnosis Date  . Chest tightness, discomfort, or pressure 05/2010    Emergency Department   . Hypertension   . Obesity   . Chronic back pain     with disc disease   . Anxiety   . Headache   . Palpitation   . Thyromegaly     TSH-0.8 and 07/2010    Medications:  Current Outpatient Rx  Name  Route  Sig  Dispense  Refill  . acetaminophen (TYLENOL) 500 MG tablet   Oral   Take 500 mg by mouth.           . Etonogestrel (IMPLANON) 68 MG IMPL   Subcutaneous   Inject into the skin. Inserted 08/2009, duration of of use 3 years          . FLUoxetine (PROZAC) 20 MG capsule   Oral   Take 1 capsule (20 mg total) by mouth daily.   90 capsule   2     DOSE INCREASE   . HYDROcodone-acetaminophen (NORCO) 5-325 MG per tablet   Oral   Take 1 tablet by mouth daily.   30 tablet   2   . metFORMIN (GLUCOPHAGE XR) 500 MG 24 hr tablet   Oral   Take 1 tablet (500 mg total) by mouth daily with breakfast.   90 tablet   1   . metoprolol tartrate (LOPRESSOR) 25 MG tablet   Oral   Take 0.5 tablets (12.5 mg total) by mouth 2 (two) times daily.   30 tablet   12   . temazepam (RESTORIL) 30 MG capsule      TAKE 1 CAPSULE BY MOUTH AT BEDTIME AS NEEDED FOR SLEEP   90 capsule    2   . triamterene-hydrochlorothiazide (MAXZIDE-25) 37.5-25 MG per tablet      TAKE 1 TABLET BY MOUTH DAILY   90 tablet   3     Labs: CMP     Component Value Date/Time   NA 141 09/22/2012 0755   K 4.1 09/22/2012 0755   CL 105 09/22/2012 0755   CO2 28 09/22/2012 0755   GLUCOSE 121* 09/22/2012 0755   BUN 13 09/22/2012 0755   CREATININE 0.72 09/22/2012 0755   CREATININE 1.33* 09/22/2012 0755   CREATININE 0.92 07/27/2010 1927   CALCIUM 8.7 09/22/2012 0755   PROT 7.2 10/23/2010 1113   ALBUMIN 4.3 10/23/2010 1113   AST 13 10/23/2010 1113   ALT 11 10/23/2010 1113   ALKPHOS 61 10/23/2010 1113   BILITOT 0.5 10/23/2010 1113   GFRNONAA >60 07/27/2010 1927   GFRAA  Value: >60        The eGFR has been calculated using the MDRD equation. This calculation has not been validated in all clinical situations. eGFR's  persistently <60 mL/min signify possible Chronic Kidney Disease. 07/27/2010 1927    Lipid Panel     Component Value Date/Time   CHOL 157 09/22/2012 0755   TRIG 54 09/22/2012 0755   HDL 42 09/22/2012 0755   CHOLHDL 3.7 09/22/2012 0755   VLDL 11 09/22/2012 0755   LDLCALC 104* 09/22/2012 0755     Lab Results  Component Value Date   HGBA1C 6.3* 09/22/2012   HGBA1C 6.6* 04/28/2012   HGBA1C 6.0* 11/03/2011   Lab Results  Component Value Date   LDLCALC 104* 09/22/2012   CREATININE 0.72 09/22/2012   CREATININE 1.33* 09/22/2012     Lifestyle/ social habits: Mary Roman lives in [place] with her fiance and 2 children (59 year old son and 68 year old daughter). 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 She reports an increased stress level, particularly since January, due to her mother suffering a stroke. She is the primary caregiver for her mother, and has limited support (her sister, who resides in [place], comes to help her every other weekend). She is physically inactive. She reports her weight gain increased 3 years ago, when she started working for [employer], as she was more mobile during her previous job. She used to walk the treadmill daily at the [facility], but ceased this activity around the holidays, due to her mother's health. She reports she would like to resume this activity, but "feels bad" to take time for herself, as she is very involved in the care of her mother.   Nutrition hx/habits: Mary Roman reports that she is an "emotional eater". She struggles to eat breakfast, stating, "I've never been a breakfast person". She trries to eat a piece of fruit with her medications in the AM. She struggles to most with portions, particularly at home. She reports she snacks excessively at home (cakes, cookies, ice cream) in front of the television, but "does well at work, because I"m occupied". She and her fiance share the grocery shopping and cooking responsibilities. She reports that she uses the weekends to cook for her mother, however, she does not prepare meals for her family during this time. As a result, she frequents fast food restaurants, sometimes as often as 3 times per week. She also indulges in [place] and [place] food and admits to eating large portions. She admits that "Coke is my drug of choice" and drinks 3-4 cans daily. She reports she is looking "for substitutions for her unhealthy habits". She aspires to get her blood sugars out of control, as well as set an example of a healthy lifestyle for her children.   Diet recall: Breakfast: banana, coffee with splenda; Lunch: salad, chicken; Dinner: [place] food.   Nutrition Diagnosis: Inconsistent carbohydrate intake r/t disordered eating pattern AEb diet recall, Hgb A1c: 6.3.  Nutrition Intervention: Nutrition rx: 1400-1500 kcal NAS, diabetic diet; 3 meals per day; limit snacks to 1 x day; limit 1 starch per meal; low calorie beverages only; 30 minutes physical activity 5 times daily  Education/Counseling Provided: Educated pt on principles of diabetic diet and weight management.  Discussed carbohydrate metabolism in relation to diabetes. Educated pt on plate method, portion sizes, and sources of carbohydrate. Discussed importance of regular meal pattern. Discussed importance of adding sources of whole grains to diet to improve glycemic control. Also encouraged to choose low fat dairy, lean meats, and whole fruits and vegetables more often. Discussed options of artificial sweeteners and encouraged pt to use  which brand she liked best. Reviewed diet recall; discussed nutritional content of foods commonly eaten and discussed healthier alternatives. Discussed importance of compliance to prevent further complications of disease. Educated pt on importance of physical activity (goal of at least 30 minutes 5 times per week) along with a healthy diet to achieve weight loss and glycemic goals. Encouraged slow, moderate weight loss of 1-2# per week, or 7-10% of current body weight. Encouraged weighing self weekly at a consistent day and time of choice. Showed pt functionality of MyFitnessPal and encouraged using a food diary to better track caloric intake. Used TeachBack to assess understanding. Provided "Carb Counting and Meal Planning" handout.   Understanding, Motivation, Ability to Follow Recommendations: Expect fair compliance.  Monitoring and Evaluation: Goals: 1) 1-2# wt loss per week; 2) 1-2 cans of coke daily; 3) 30 minutes physical activity daily; 4) 3 meals daily; 5) Keep food diary (ex. My Fitness Pal)  Recommendations: 1) Break exercise up into smaller, more frequent sessions; 2) Take some time for yourself- exercise!; 3) Prepare extra food for your family when you cook for your mother; 4) Buy low calorie alternatives to soda (falvored water, low calorie drink mixes); 5) Keep frozen vegetables on hand for easy dinners; 6) Cut up vegetables as you are putting them away  F/U: PRN. Pt reports she will call and schedule a follow-up within 2-3 months.   Melody Haver, RD,  LDN 11/24/2012  Appt EndTime: 1320

## 2012-12-03 ENCOUNTER — Other Ambulatory Visit: Payer: Self-pay | Admitting: Family Medicine

## 2012-12-03 DIAGNOSIS — Z139 Encounter for screening, unspecified: Secondary | ICD-10-CM

## 2012-12-16 ENCOUNTER — Other Ambulatory Visit: Payer: Self-pay

## 2012-12-16 MED ORDER — TEMAZEPAM 30 MG PO CAPS: ORAL_CAPSULE | ORAL | Status: DC

## 2013-01-01 ENCOUNTER — Ambulatory Visit (HOSPITAL_COMMUNITY)
Admission: RE | Admit: 2013-01-01 | Discharge: 2013-01-01 | Disposition: A | Discharge status: Home / Self Care | Payer: 59 | Source: Ambulatory Visit | Attending: Family Medicine | Admitting: Family Medicine

## 2013-01-01 DIAGNOSIS — Z1231 Encounter for screening mammogram for malignant neoplasm of breast: Secondary | ICD-10-CM | POA: Insufficient documentation

## 2013-01-01 DIAGNOSIS — Z139 Encounter for screening, unspecified: Secondary | ICD-10-CM

## 2013-01-12 ENCOUNTER — Ambulatory Visit: Payer: 59 | Admitting: Family Medicine

## 2013-01-15 ENCOUNTER — Ambulatory Visit: Payer: 59 | Admitting: Family Medicine

## 2013-02-09 ENCOUNTER — Telehealth: Payer: Self-pay | Admitting: Family Medicine

## 2013-02-09 ENCOUNTER — Other Ambulatory Visit: Payer: Self-pay | Admitting: Family Medicine

## 2013-02-09 MED ORDER — PHENTERMINE HCL 37.5 MG PO TABS: 37.5000 mg | ORAL_TABLET | Freq: Every day | ORAL | Status: DC

## 2013-02-09 NOTE — Telephone Encounter (Signed)
requests appetite suppressant to help with weight loss, has been over eating due to stress. Explained s/e and adverse effects, she is to start with half tablet daily and is aware of this though script sent in for 1 daily. Will keep appt in 10 days

## 2013-02-19 ENCOUNTER — Ambulatory Visit (INDEPENDENT_AMBULATORY_CARE_PROVIDER_SITE_OTHER): Payer: 59 | Admitting: Family Medicine

## 2013-02-19 ENCOUNTER — Encounter: Payer: Self-pay | Admitting: Family Medicine

## 2013-02-19 VITALS — BP 120/84 | HR 97 | Resp 16 | Ht 66.5 in | Wt 204.8 lb

## 2013-02-19 DIAGNOSIS — I10 Essential (primary) hypertension: Secondary | ICD-10-CM

## 2013-02-19 DIAGNOSIS — E559 Vitamin D deficiency, unspecified: Secondary | ICD-10-CM

## 2013-02-19 DIAGNOSIS — E669 Obesity, unspecified: Secondary | ICD-10-CM

## 2013-02-19 DIAGNOSIS — M549 Dorsalgia, unspecified: Secondary | ICD-10-CM

## 2013-02-19 DIAGNOSIS — F3289 Other specified depressive episodes: Secondary | ICD-10-CM

## 2013-02-19 DIAGNOSIS — F32A Depression, unspecified: Secondary | ICD-10-CM

## 2013-02-19 DIAGNOSIS — E119 Type 2 diabetes mellitus without complications: Secondary | ICD-10-CM

## 2013-02-19 DIAGNOSIS — F329 Major depressive disorder, single episode, unspecified: Secondary | ICD-10-CM

## 2013-02-19 DIAGNOSIS — R7309 Other abnormal glucose: Secondary | ICD-10-CM

## 2013-02-19 HISTORY — DX: Vitamin D deficiency, unspecified: E55.9

## 2013-02-19 MED ORDER — ERGOCALCIFEROL 1.25 MG (50000 UT) PO CAPS: 50000.0000 [IU] | ORAL_CAPSULE | ORAL | Status: DC

## 2013-02-19 NOTE — Patient Instructions (Addendum)
F/u in 4 month  Weight loss goal is  16 pounds  HBA1C and chem 7  toDAY    CONTINUE PHENTERMINE HALF DAILY  It is important that you exercise regularly at least 30 minutes 6 times a week. If you develop chest pain, have severe difficulty breathing, or feel very tired, stop exercising immediately and seek medical attention   New is vitamin D once weekly, for 6 month

## 2013-02-20 ENCOUNTER — Encounter: Payer: Self-pay | Admitting: Family Medicine

## 2013-02-20 LAB — BASIC METABOLIC PANEL
Calcium: 9.8 mg/dL (ref 8.4–10.5)
Creat: 0.85 mg/dL (ref 0.50–1.10)
Glucose, Bld: 82 mg/dL (ref 70–99)
Sodium: 135 mEq/L (ref 135–145)

## 2013-02-20 NOTE — Assessment & Plan Note (Signed)
Continue current medication, impoved with weight loss

## 2013-02-20 NOTE — Assessment & Plan Note (Signed)
Improved with more help for her ill month, also with medication help

## 2013-02-20 NOTE — Assessment & Plan Note (Signed)
Controlled, no change in medication DASH diet and commitment to daily physical activity for a minimum of 30 minutes discussed and encouraged, as a part of hypertension management. The importance of attaining a healthy weight is also discussed.  

## 2013-02-20 NOTE — Progress Notes (Signed)
  Subjective:    Patient ID: Mary Roman, female    DOB: Nov 12, 1970, 42 y.o.   MRN: [id number]  HPI The PT is here for follow up and re-evaluation of chronic medical conditions, medication management and review of any available recent lab and radiology data.  Preventive health is updated, specifically  Cancer screening and Immunization.   The PT denies any adverse reactions to current medications since the last visit. She has discontinued metformin independently, and is getting help with phentermine as far as appetite suppression and weight loss are concerned, with excellent results to daret. Also comited to daily exercise and feels better. Stress is a little better with help from her sister in caring for Mom There are no new concerns.  Back pain has lessened, and relies less on restoril for help with sleep     Review of Systems See HPI Denies recent fever or chills. Denies sinus pressure, nasal congestion, ear pain or sore throat. Denies chest congestion, productive cough or wheezing. Denies chest pains, palpitations and leg swelling Denies abdominal pain, nausea, vomiting,diarrhea or constipation.   Denies dysuria, frequency, hesitancy or incontinence.  Denies headaches, seizures, numbness, or tingling.  Denies skin break down or rash.        Objective:   Physical Exam  Patient alert and oriented and in no cardiopulmonary distress.  HEENT: No facial asymmetry, EOMI, no sinus tenderness,  oropharynx pink and moist.  Neck supple no adenopathy.  Chest: Clear to auscultation bilaterally.  CVS: S1, S2 no murmurs, no S3.  ABD: Soft non tender. Bowel sounds normal.  Ext: No edema  MS: Adequate ROM spine, shoulders, hips and knees.  Skin: Intact, no ulcerations or rash noted.  Psych: Good eye contact, normal affect. Memory intact not anxious or depressed appearing.  CNS: CN 2-12 intact, power, tone and sensation normal throughout.       Assessment & Plan:

## 2013-02-20 NOTE — Assessment & Plan Note (Signed)
Managed by diet and exercise only, improving. Pt elected to discontinue metformin, an has recently started phentermine to help with weight loss, she is also comited to exercise 5 days per week

## 2013-02-20 NOTE — Assessment & Plan Note (Signed)
Improved. Pt applauded on succesful weight loss through lifestyle change, and encouraged to continue same. Weight loss goal set for the next several months.  

## 2013-02-20 NOTE — Assessment & Plan Note (Signed)
Needs to start weekly vit D for 6 month

## 2013-03-04 ENCOUNTER — Other Ambulatory Visit: Payer: Self-pay

## 2013-03-04 MED ORDER — TEMAZEPAM 30 MG PO CAPS: ORAL_CAPSULE | ORAL | Status: DC

## 2013-03-25 ENCOUNTER — Other Ambulatory Visit: Payer: Self-pay | Admitting: Cardiology

## 2013-05-08 ENCOUNTER — Other Ambulatory Visit: Payer: Self-pay

## 2013-05-08 MED ORDER — HYDROCODONE-ACETAMINOPHEN 5-325 MG PO TABS: ORAL_TABLET | ORAL | Status: DC

## 2013-05-25 ENCOUNTER — Other Ambulatory Visit: Payer: Self-pay

## 2013-05-25 MED ORDER — PHENTERMINE HCL 37.5 MG PO TABS: 37.5000 mg | ORAL_TABLET | Freq: Every day | ORAL | Status: DC

## 2013-05-25 MED ORDER — TRIAMTERENE-HCTZ 37.5-25 MG PO TABS: ORAL_TABLET | ORAL | Status: DC

## 2013-05-27 ENCOUNTER — Ambulatory Visit (INDEPENDENT_AMBULATORY_CARE_PROVIDER_SITE_OTHER): Payer: 59 | Admitting: Family Medicine

## 2013-05-27 ENCOUNTER — Encounter: Payer: Self-pay | Admitting: Family Medicine

## 2013-05-27 ENCOUNTER — Telehealth: Payer: Self-pay | Admitting: Family Medicine

## 2013-05-27 VITALS — BP 118/80 | HR 76 | Temp 98.6°F | Resp 16 | Wt 197.1 lb

## 2013-05-27 DIAGNOSIS — R058 Other specified cough: Secondary | ICD-10-CM

## 2013-05-27 DIAGNOSIS — J209 Acute bronchitis, unspecified: Secondary | ICD-10-CM

## 2013-05-27 DIAGNOSIS — R05 Cough: Secondary | ICD-10-CM

## 2013-05-27 DIAGNOSIS — J309 Allergic rhinitis, unspecified: Secondary | ICD-10-CM

## 2013-05-27 DIAGNOSIS — A499 Bacterial infection, unspecified: Secondary | ICD-10-CM

## 2013-05-27 DIAGNOSIS — J029 Acute pharyngitis, unspecified: Secondary | ICD-10-CM | POA: Insufficient documentation

## 2013-05-27 DIAGNOSIS — I10 Essential (primary) hypertension: Secondary | ICD-10-CM

## 2013-05-27 DIAGNOSIS — B9689 Other specified bacterial agents as the cause of diseases classified elsewhere: Secondary | ICD-10-CM

## 2013-05-27 DIAGNOSIS — R059 Cough, unspecified: Secondary | ICD-10-CM

## 2013-05-27 MED ORDER — LORATADINE 10 MG PO TBDP: 10.0000 mg | ORAL_TABLET | Freq: Every day | ORAL | Status: DC

## 2013-05-27 MED ORDER — METHYLPREDNISOLONE ACETATE 80 MG/ML IJ SUSP
80.0000 mg | Freq: Once | INTRAMUSCULAR | Status: AC
  Administered 2013-05-27: 80 mg via INTRAMUSCULAR

## 2013-05-27 MED ORDER — HYDROCOD POLST-CHLORPHEN POLST 10-8 MG/5ML PO LQCR: 5.0000 mL | Freq: Two times a day (BID) | ORAL | Status: DC | PRN

## 2013-05-27 MED ORDER — FLUTICASONE PROPIONATE 50 MCG/ACT NA SUSP: 2.0000 | Freq: Every day | NASAL | Status: DC

## 2013-05-27 MED ORDER — BENZONATATE 100 MG PO CAPS: 100.0000 mg | ORAL_CAPSULE | Freq: Three times a day (TID) | ORAL | Status: DC | PRN

## 2013-05-27 MED ORDER — AZITHROMYCIN 250 MG PO TABS: ORAL_TABLET | ORAL | Status: AC

## 2013-05-27 MED ORDER — PREDNISONE 5 MG PO TABS: 5.0000 mg | ORAL_TABLET | Freq: Two times a day (BID) | ORAL | Status: AC

## 2013-05-27 NOTE — Patient Instructions (Signed)
F/u as before, call if you need me before  You are being treated for uncontrolled allergies and bronchitis Depo medrol 80mg  IM in office. Tussionex sent to [facility] , sedative side effect so restrict to use at night only  Z pack, flonase loratidine and tessalon perles have been sent to [facility] pharmacy

## 2013-05-27 NOTE — Progress Notes (Signed)
  Subjective:    Patient ID: Mary Roman, female    DOB: 1970-09-04, 42 y.o.   MRN: [id number]  HPI 1 month h/o dry hacky cough, no sputum production, excessive post nasal drainage worse in supine position, preventing sleep. Has shad intermittent chills, no documented fever. Symptoms have worsened esp in the past 1 week, and other family members have  Been and continue to be sick also C/o sore throat   Review of Systems See HPI Denies chest congestion, productive cough or wheezing. Denies chest pains, palpitations and leg swelling Denies abdominal pain, nausea, vomiting,diarrhea or constipation.   Denies dysuria, frequency, hesitancy or incontinence. Denies joint pain, swelling and limitation in mobility. Denies headaches, seizures, numbness, or tingling. Denies depression, anxiety or insomnia. Denies skin break down or rash.        Objective:   Physical Exam Patient alert and oriented and in no cardiopulmonary distress.  HEENT: No facial asymmetry, EOMI, no sinus tenderness,  oropharynx pink and moist.  Neck supple no adenopathy.TM clear, nasal mucosa erythematous and edematous  Chest: decreased though adequate air entry,  Scattered crackles, no wheezes  CVS: S1, S2 no murmurs, no S3.  ABD: Soft non tender. Bowel sounds normal.  Ext: No edema  MS: Adequate ROM spine, shoulders, hips and knees.  Skin: Intact, no ulcerations or rash noted.  Psych: Good eye contact, normal affect. Memory intact not anxious or depressed appearing.  CNS: CN 2-12 intact, power, tone and sensation normal throughout.        Assessment & Plan:

## 2013-06-01 ENCOUNTER — Other Ambulatory Visit: Payer: Self-pay

## 2013-06-01 MED ORDER — HYDROCODONE-ACETAMINOPHEN 5-325 MG PO TABS: ORAL_TABLET | ORAL | Status: DC

## 2013-06-01 NOTE — Telephone Encounter (Signed)
Patient picked up 10.31.2014

## 2013-06-04 ENCOUNTER — Other Ambulatory Visit: Payer: Self-pay

## 2013-06-19 ENCOUNTER — Other Ambulatory Visit: Payer: Self-pay | Admitting: Family Medicine

## 2013-06-21 DIAGNOSIS — B9689 Other specified bacterial agents as the cause of diseases classified elsewhere: Secondary | ICD-10-CM | POA: Insufficient documentation

## 2013-06-21 NOTE — Assessment & Plan Note (Signed)
Acute infection, antibiotic and decongestant prescribed 

## 2013-06-21 NOTE — Assessment & Plan Note (Signed)
Controlled, no change in medication DASH diet and commitment to daily physical activity for a minimum of 30 minutes discussed and encouraged, as a part of hypertension management. The importance of attaining a healthy weight is also discussed.  

## 2013-06-21 NOTE — Assessment & Plan Note (Signed)
Salt water gargles and antibiotic course and voice rest, tylenol for pain

## 2013-06-21 NOTE — Assessment & Plan Note (Addendum)
Uncontrolled commitment to daily medication needed Depo medrol 80mg  IM in the office

## 2013-06-22 ENCOUNTER — Other Ambulatory Visit: Payer: Self-pay

## 2013-06-22 MED ORDER — TEMAZEPAM 30 MG PO CAPS: ORAL_CAPSULE | ORAL | Status: DC

## 2013-06-29 ENCOUNTER — Ambulatory Visit: Payer: 59 | Admitting: Family Medicine

## 2013-07-01 ENCOUNTER — Other Ambulatory Visit: Payer: Self-pay

## 2013-07-01 MED ORDER — HYDROCODONE-ACETAMINOPHEN 5-325 MG PO TABS: ORAL_TABLET | ORAL | Status: DC

## 2013-08-06 ENCOUNTER — Other Ambulatory Visit: Payer: Self-pay

## 2013-08-06 MED ORDER — HYDROCODONE-ACETAMINOPHEN 5-325 MG PO TABS: ORAL_TABLET | ORAL | Status: DC

## 2013-08-19 ENCOUNTER — Encounter: Payer: Self-pay | Admitting: Family Medicine

## 2013-08-19 ENCOUNTER — Ambulatory Visit (INDEPENDENT_AMBULATORY_CARE_PROVIDER_SITE_OTHER): Payer: 59 | Admitting: Family Medicine

## 2013-08-19 VITALS — BP 120/80 | HR 92 | Resp 16 | Ht 66.5 in | Wt 201.0 lb

## 2013-08-19 DIAGNOSIS — E669 Obesity, unspecified: Secondary | ICD-10-CM

## 2013-08-19 DIAGNOSIS — E785 Hyperlipidemia, unspecified: Secondary | ICD-10-CM

## 2013-08-19 DIAGNOSIS — E559 Vitamin D deficiency, unspecified: Secondary | ICD-10-CM

## 2013-08-19 DIAGNOSIS — M549 Dorsalgia, unspecified: Secondary | ICD-10-CM

## 2013-08-19 DIAGNOSIS — F329 Major depressive disorder, single episode, unspecified: Secondary | ICD-10-CM

## 2013-08-19 DIAGNOSIS — F32A Depression, unspecified: Secondary | ICD-10-CM

## 2013-08-19 DIAGNOSIS — I10 Essential (primary) hypertension: Secondary | ICD-10-CM

## 2013-08-19 DIAGNOSIS — F3289 Other specified depressive episodes: Secondary | ICD-10-CM

## 2013-08-19 DIAGNOSIS — R7309 Other abnormal glucose: Secondary | ICD-10-CM

## 2013-08-19 DIAGNOSIS — R7303 Prediabetes: Secondary | ICD-10-CM

## 2013-08-19 MED ORDER — PHENTERMINE HCL 37.5 MG PO TABS: 37.5000 mg | ORAL_TABLET | Freq: Every day | ORAL | Status: DC

## 2013-08-19 MED ORDER — FLUOXETINE HCL 20 MG PO CAPS: 20.0000 mg | ORAL_CAPSULE | Freq: Every day | ORAL | Status: DC

## 2013-08-19 NOTE — Assessment & Plan Note (Signed)
Controlled, no change in medication, improved social circumstance, more help with her Mom Updated score in 4 month

## 2013-08-19 NOTE — Assessment & Plan Note (Signed)
recheck in Feb

## 2013-08-19 NOTE — Progress Notes (Signed)
   Subjective:    Patient ID: Mary Roman, female    DOB: 23-Oct-1970, 43 y.o.   MRN: [id number]  HPI The PT is here for follow up and re-evaluation of chronic medical conditions, medication management and review of any available recent lab and radiology data.  Preventive health is updated, specifically  Cancer screening and Immunization.   Questions or concerns regarding consultations or procedures which the PT has had in the interim are  addressed. The PT denies any adverse reactions to current medications since the last visit.  There are no new concerns.  There are no specific complaints       Review of Systems See HPI Denies recent fever or chills. Denies sinus pressure, nasal congestion, ear pain or sore throat. Denies chest congestion, productive cough or wheezing. Denies chest pains, palpitations and leg swelling Denies abdominal pain, nausea, vomiting,diarrhea or constipation.   Denies dysuria, frequency, hesitancy or incontinence. Denies joint pain, swelling and limitation in mobility. Denies headaches, seizures, numbness, or tingling. Denies depression, anxiety or insomnia. Denies skin break down or rash.        Objective:   Physical Exam Patient alert and oriented and in no cardiopulmonary distress.  HEENT: No facial asymmetry, EOMI, no sinus tenderness,  oropharynx pink and moist.  Neck supple no adenopathy.  Chest: Clear to auscultation bilaterally.  CVS: S1, S2 no murmurs, no S3.  ABD: Soft non tender. Bowel sounds normal.  Ext: No edema  MS: Adequate ROM spine, shoulders, hips and knees.  Skin: Intact, no ulcerations or rash noted.  Psych: Good eye contact, normal affect. Memory intact not anxious or depressed appearing.  CNS: CN 2-12 intact, power, tone and sensation normal throughout.        Assessment & Plan:

## 2013-08-19 NOTE — Assessment & Plan Note (Signed)
Unchanged, chronic pain meds as before 

## 2013-08-19 NOTE — Assessment & Plan Note (Signed)
Updated lab in February Patient educated about the importance of limiting  Carbohydrate intake , the need to commit to daily physical activity for a minimum of 30 minutes , and to commit weight loss. The fact that changes in all these areas will reduce or eliminate all together the development of diabetes is stressed.

## 2013-08-19 NOTE — Assessment & Plan Note (Signed)
Deteriorated. Patient re-educated about  the importance of commitment to a  minimum of 150 minutes of exercise per week. The importance of healthy food choices with portion control discussed. Encouraged to start a food diary, count calories and to consider  joining a support group. Sample diet sheets offered. Goals set by the patient for the next several months. Increase phentermine to ne daily, recommit to healthy choices

## 2013-08-19 NOTE — Patient Instructions (Signed)
F/u in 4 month, call if you need me before  Fasting lipid, cmp, cBC, hBA1C, TSH and Vit D Feb 28 or after    No med changes  Weight loss goal of 2.5 pounds per month  It is important that you exercise regularly at least 30 minutes 5 times a week. If you develop chest pain, have severe difficulty breathing, or feel very tired, stop exercising immediately and seek medical attention    A healthy diet is rich in fruit, vegetables and whole grains. Poultry fish, nuts and beans are a healthy choice for protein rather then red meat. A low sodium diet and drinking 64 ounces of water daily is generally recommended. Oils and sweet should be limited. Carbohydrates especially for those who are diabetic or overweight, should be limited to 60-45 gram per meal. It is important to eat on a regular schedule, at least 3 times daily. Snacks should be primarily fruits, vegetables or nuts.  Metabolic Syndrome, Adult Metabolic syndrome descibes a group of risk factors for heart disease and diabetes. This syndrome has other names including Insulin Resistance Syndrome. The more risk factors you have, the higher your risk of having a heart attack, stroke, or developing diabetes. These risk factors include:  High blood sugar.  High blood triglyceride (a fat found in the blood) level.  High blood pressure.  Abdominal obesity (your extra weight is around your waist instead of your hips).  Low levels of high-density lipoprotein, HDL (good blood cholesterol). If you have any three of these risk factors, you have metabolic syndrome. If you have even one of these factors, you should make lifestyle changes to improve your health in order to prevent serious health diseases.  In people with metabolic syndrome, the cells do not respond properly to insulin. This can lead to high levels of glucose in the blood, which can interfere with normal body processes. Eventually, this can cause high blood pressure and higher fat levels  in the blood, and inflammation of your blood vessels. The result can be heart disease and stroke.  CAUSES   Eating a diet rich in calories and saturated fat.  Too little physical activity.  Being overweight. Other underlying causes are:  Family history (genetics).  Ethnicity (South Asians are at a higher risk).  Older age (your chances of developing metabolic syndrome are higher as you grow older).  Insulin resistance. SYMPTOMS  By itself, metabolic syndrome has no symptoms. However, you might have symptoms of diabetes (high blood sugar) or high blood pressure, such as:  Increased thirst, urination, and tiredness.  Dizzy spells.  Dull headaches that are unusual for you.  Blurred vision.  Nosebleeds. DIAGNOSIS  Your caregiver may make a diagnosis of metabolic syndrome if you have at least three of these factors:  If you are overweight mostly around the waist. This means a waistline greater than 40" in men and more than 35" in women. The waistline limits are 31 to 35 inches for women and 37 to 39 inches for men. In those who have certain genetic risk factors, such as having a family history of diabetes or being of Asian descent.  If you have a blood pressure of 130/85 mm Hg or more, or if you are being treated for high blood pressure.  If your blood triglyceride level is 150 mg/dL or more, or you are being treated for high levels of triglyceride.  If the level of HDL in your blood is below 40 mg/dL in men, less than 50  mg/dL in women, or you are receiving treatment for low levels of HDL.  If the level of sugar in your blood is high with fasting blood sugar level of 110 mg/dL or more, or you are under treatment for diabetes. TREATMENT  Your caregiver may have you make lifestyle changes, which may include:  Exercise.  Losing weight.  Maintaining a healthy diet.  Quitting smoking. The lifestyle changes listed above are key in reducing your risk for heart disease and  stroke. Medicines may also be prescribed to help your body respond to insulin better and to reduce your blood pressure and blood fat levels. Aspirin may be recommended to reduce risks of heart disease or stroke.  HOME CARE INSTRUCTIONS   Exercise.  Measure your waist at regular intervals just above the hipbones after you have breathed out.  Maintain a healthy diet.  Eat fruits, such as apples, oranges, and pears.  Eat vegetables.  Eat legumes, such as kidney beans, peas, and lentils.  Eat food rich in soluble fiber, such as whole grain cereal, oatmeal, and oat bran.  Use olive or safflower oils and avoid saturated fats.  Eat nuts.  Limit the amount of salt you eat or add to food.  Limit the amount of alcohol you drink.  Include fish in your diet, if possible.  Stop smoking if you are a smoker.  Maintain regular follow-up appointments.  Follow your caregiver's advice. SEEK MEDICAL CARE IF:   You feel very tired or fatigued.  You develop excessive thirst.  You pass large quantities of urine.  You are putting on weight around your waist rather than losing weight.  You develop headaches over and over again.  You have off-and-on dizzy spells. SEEK IMMEDIATE MEDICAL CARE IF:   You develop nosebleeds.  You develop sudden blurred vision.  You develop sudden dizzy spells.  You develop chest pains, trouble breathing, or feel an abnormal or irregular heart beat.  You have a fainting episode.  You develop any sudden trouble speaking and/or swallowing.  You develop sudden weakness in one arm and/or one leg. MAKE SURE YOU:   Understand these instructions.  Will watch your condition.  Will get help right away if you are not doing well or get worse. Document Released: 10/23/2007 Document Revised: 10/08/2011 Document Reviewed: 10/23/2007 [facility] Patient Information 2014 [place], [place].

## 2013-08-19 NOTE — Assessment & Plan Note (Signed)
Controlled, no change in medication DASH diet and commitment to daily physical activity for a minimum of 30 minutes discussed and encouraged, as a part of hypertension management. The importance of attaining a healthy weight is also discussed.  

## 2013-09-02 ENCOUNTER — Other Ambulatory Visit: Payer: Self-pay

## 2013-09-02 MED ORDER — HYDROCODONE-ACETAMINOPHEN 5-325 MG PO TABS: ORAL_TABLET | ORAL | Status: DC

## 2013-09-25 ENCOUNTER — Other Ambulatory Visit: Payer: Self-pay

## 2013-09-25 MED ORDER — HYDROCODONE-ACETAMINOPHEN 5-325 MG PO TABS: ORAL_TABLET | ORAL | Status: DC

## 2013-10-07 ENCOUNTER — Other Ambulatory Visit: Payer: Self-pay

## 2013-10-07 MED ORDER — LORATADINE 10 MG PO TABS: 10.0000 mg | ORAL_TABLET | Freq: Every day | ORAL | Status: DC

## 2013-10-23 ENCOUNTER — Other Ambulatory Visit: Payer: Self-pay

## 2013-10-23 MED ORDER — HYDROCODONE-ACETAMINOPHEN 5-325 MG PO TABS: ORAL_TABLET | ORAL | Status: DC

## 2013-11-02 ENCOUNTER — Other Ambulatory Visit: Payer: Self-pay | Admitting: Family Medicine

## 2013-11-02 DIAGNOSIS — Z1231 Encounter for screening mammogram for malignant neoplasm of breast: Secondary | ICD-10-CM

## 2013-12-02 ENCOUNTER — Other Ambulatory Visit: Payer: Self-pay | Admitting: Advanced Practice Midwife

## 2013-12-04 ENCOUNTER — Other Ambulatory Visit: Payer: Self-pay

## 2013-12-04 MED ORDER — HYDROCODONE-ACETAMINOPHEN 5-325 MG PO TABS: ORAL_TABLET | ORAL | Status: DC

## 2013-12-09 ENCOUNTER — Other Ambulatory Visit: Payer: Self-pay | Admitting: Advanced Practice Midwife

## 2013-12-16 ENCOUNTER — Other Ambulatory Visit (HOSPITAL_COMMUNITY)
Admission: RE | Admit: 2013-12-16 | Discharge: 2013-12-16 | Disposition: A | Discharge status: Home / Self Care | Payer: 59 | Source: Ambulatory Visit | Attending: Advanced Practice Midwife | Admitting: Advanced Practice Midwife

## 2013-12-16 ENCOUNTER — Encounter: Payer: Self-pay | Admitting: Advanced Practice Midwife

## 2013-12-16 ENCOUNTER — Ambulatory Visit (INDEPENDENT_AMBULATORY_CARE_PROVIDER_SITE_OTHER): Payer: 59 | Admitting: Advanced Practice Midwife

## 2013-12-16 VITALS — BP 112/74 | Ht 64.75 in | Wt 203.0 lb

## 2013-12-16 DIAGNOSIS — Z1151 Encounter for screening for human papillomavirus (HPV): Secondary | ICD-10-CM | POA: Insufficient documentation

## 2013-12-16 DIAGNOSIS — Z01419 Encounter for gynecological examination (general) (routine) without abnormal findings: Secondary | ICD-10-CM

## 2013-12-16 NOTE — Progress Notes (Signed)
Mary Roman 43 y.o.  Filed Vitals:   12/16/13 0916  BP: 112/74     Past Medical History: Past Medical History  Diagnosis Date  . Chest tightness, discomfort, or pressure 05/2010    Emergency Department   . Hypertension   . Obesity   . Chronic back pain     with disc disease   . Anxiety   . Headache(784.0)   . Palpitation   . Thyromegaly     TSH-0.8 and 07/2010    Past Surgical History: Past Surgical History  Procedure Laterality Date  . None      Family History: Family History  Problem Relation Age of Onset  . Diabetes Mother   . Hypertension Mother   . Stroke Mother   . Pneumonia Father   . Hypertension Sister   . Hypertension Sister   . Leukemia Sister   . Heart attack Maternal Uncle   . Diabetes Maternal Grandmother   . Hypertension Maternal Grandmother   . Hypertension Maternal Grandfather     Social History: History  Substance Use Topics  . Smoking status: Never Smoker   . Smokeless tobacco: Never Used  . Alcohol Use: No    Allergies: No Known Allergies   History of Present Illness:  Here for pap/breast exam.  PCP manages chronic illness issues.  Husband had vasectomy, periods monthly and normal.  No c/o.  Current outpatient prescriptions:FLUoxetine (PROZAC) 20 MG capsule, Take 1 capsule (20 mg total) by mouth daily., Disp: 90 capsule, Rfl: 1;  fluticasone (FLONASE) 50 MCG/ACT nasal spray, Place 2 sprays into the nose daily., Disp: 16 g, Rfl: 2;  HYDROcodone-acetaminophen (NORCO/VICODIN) 5-325 MG per tablet, TAKE ONE TABLET BY MOUTH ONCE DAILY, Disp: 30 tablet, Rfl: 0 loratadine (CLARITIN) 10 MG tablet, Take 1 tablet (10 mg total) by mouth daily., Disp: 90 tablet, Rfl: 1;  metoprolol tartrate (LOPRESSOR) 25 MG tablet, TAKE 1/2 TABLETS BY MOUTH 2 TIMES DAILY., Disp: 30 tablet, Rfl: 12;  phentermine (ADIPEX-P) 37.5 MG tablet, Take 1 tablet (37.5 mg total) by mouth daily before breakfast., Disp: 90 tablet, Rfl: 0 temazepam (RESTORIL) 30 MG  capsule, TAKE 1 CAPSULE BY MOUTH AT BEDTIME AS NEEDED FOR SLEEP, Disp: 90 capsule, Rfl: 1;  triamterene-hydrochlorothiazide (MAXZIDE-25) 37.5-25 MG per tablet, TAKE 1 TABLET BY MOUTH DAILY, Disp: 90 tablet, Rfl: 2;  ergocalciferol (VITAMIN D2) 50000 UNITS capsule, Take 1 capsule (50,000 Units total) by mouth once a week. One capsule once weekly, Disp: 12 capsule, Rfl: 1    Review of Systems   Patient denies any headaches, blurred vision, shortness of breath, chest pain, abdominal pain, problems with bowel movements, urination, or intercourse.   Physical Exam: General:  Well developed, well nourished, no acute distress Skin:  Warm and dry Breast:  No dominant palpable mass, retraction, or nipple discharge Pelvic:  External genitalia is normal in appearance.  The vagina is normal in appearance.   The cervix is bulbous.  Uterus is felt to be normal size, shape, and contour.  No  adnexal masses or tenderness noted. Exam limited by obesity. Extremities:  No swelling or varicosities noted Psych:  No mood changes   Impression: Normal well woman visit  Plan:  Dr. Moshe Cipro is PCP Has appt for mammogram 01/04/14

## 2013-12-17 ENCOUNTER — Ambulatory Visit: Payer: 59 | Admitting: Family Medicine

## 2013-12-17 ENCOUNTER — Other Ambulatory Visit: Payer: Self-pay | Admitting: Family Medicine

## 2013-12-31 ENCOUNTER — Other Ambulatory Visit: Payer: Self-pay

## 2013-12-31 MED ORDER — HYDROCODONE-ACETAMINOPHEN 5-325 MG PO TABS: ORAL_TABLET | ORAL | Status: DC

## 2014-01-04 ENCOUNTER — Ambulatory Visit (HOSPITAL_COMMUNITY)
Admission: RE | Admit: 2014-01-04 | Discharge: 2014-01-04 | Disposition: A | Discharge status: Home / Self Care | Payer: 59 | Source: Ambulatory Visit | Attending: Family Medicine | Admitting: Family Medicine

## 2014-01-04 DIAGNOSIS — Z1231 Encounter for screening mammogram for malignant neoplasm of breast: Secondary | ICD-10-CM | POA: Insufficient documentation

## 2014-01-27 ENCOUNTER — Ambulatory Visit: Payer: 59 | Admitting: Family Medicine

## 2014-01-27 ENCOUNTER — Other Ambulatory Visit: Payer: Self-pay

## 2014-01-27 MED ORDER — HYDROCODONE-ACETAMINOPHEN 5-325 MG PO TABS: ORAL_TABLET | ORAL | Status: DC

## 2014-01-28 ENCOUNTER — Telehealth: Payer: Self-pay | Admitting: Family Medicine

## 2014-01-28 MED ORDER — ONDANSETRON HCL 4 MG PO TABS: 4.0000 mg | ORAL_TABLET | Freq: Three times a day (TID) | ORAL | Status: DC | PRN

## 2014-01-28 NOTE — Telephone Encounter (Signed)
I ate bad chineese food yesterday and I have very bad nausea, I need medication. Zofran is sent to [facility] pt is aware

## 2014-02-16 ENCOUNTER — Other Ambulatory Visit: Payer: Self-pay | Admitting: Family Medicine

## 2014-02-25 ENCOUNTER — Other Ambulatory Visit: Payer: Self-pay | Admitting: Family Medicine

## 2014-02-25 LAB — CBC WITH DIFFERENTIAL/PLATELET
Basophils Absolute: 0.1 10*3/uL (ref 0.0–0.1)
Basophils Relative: 1 % (ref 0–1)
EOS ABS: 0.3 10*3/uL (ref 0.0–0.7)
EOS PCT: 5 % (ref 0–5)
HCT: 34 % — ABNORMAL LOW (ref 36.0–46.0)
Hemoglobin: 11.5 g/dL — ABNORMAL LOW (ref 12.0–15.0)
Lymphocytes Relative: 38 % (ref 12–46)
Lymphs Abs: 2.4 10*3/uL (ref 0.7–4.0)
MCH: 27.5 pg (ref 26.0–34.0)
MCHC: 33.8 g/dL (ref 30.0–36.0)
MCV: 81.3 fL (ref 78.0–100.0)
MONOS PCT: 6 % (ref 3–12)
Monocytes Absolute: 0.4 10*3/uL (ref 0.1–1.0)
NEUTROS PCT: 50 % (ref 43–77)
Neutro Abs: 3.1 10*3/uL (ref 1.7–7.7)
PLATELETS: 512 10*3/uL — AB (ref 150–400)
RBC: 4.18 MIL/uL (ref 3.87–5.11)
RDW: 13.5 % (ref 11.5–15.5)
WBC: 6.2 10*3/uL (ref 4.0–10.5)

## 2014-02-25 LAB — HEMOGLOBIN A1C
Hgb A1c MFr Bld: 6.6 % — ABNORMAL HIGH (ref <5.7)
Mean Plasma Glucose: 143 mg/dL — ABNORMAL HIGH (ref <117)

## 2014-02-25 LAB — COMPREHENSIVE METABOLIC PANEL
ALT: 12 U/L (ref 0–35)
AST: 15 U/L (ref 0–37)
Albumin: 3.9 g/dL (ref 3.5–5.2)
Alkaline Phosphatase: 57 U/L (ref 39–117)
BILIRUBIN TOTAL: 0.5 mg/dL (ref 0.2–1.2)
BUN: 12 mg/dL (ref 6–23)
CO2: 26 meq/L (ref 19–32)
Calcium: 9.3 mg/dL (ref 8.4–10.5)
Chloride: 102 mEq/L (ref 96–112)
Creat: 0.84 mg/dL (ref 0.50–1.10)
GLUCOSE: 102 mg/dL — AB (ref 70–99)
Potassium: 3.8 mEq/L (ref 3.5–5.3)
Sodium: 137 mEq/L (ref 135–145)
Total Protein: 7.2 g/dL (ref 6.0–8.3)

## 2014-02-25 LAB — LIPID PANEL
CHOL/HDL RATIO: 4.4 ratio
CHOLESTEROL: 153 mg/dL (ref 0–200)
HDL: 35 mg/dL — ABNORMAL LOW (ref >39)
LDL Cholesterol: 106 mg/dL — ABNORMAL HIGH (ref 0–99)
TRIGLYCERIDES: 60 mg/dL (ref <150)
VLDL: 12 mg/dL (ref 0–40)

## 2014-02-25 LAB — TSH: TSH: 1.198 u[IU]/mL (ref 0.350–4.500)

## 2014-02-26 ENCOUNTER — Other Ambulatory Visit: Payer: Self-pay

## 2014-02-26 LAB — VITAMIN D 25 HYDROXY (VIT D DEFICIENCY, FRACTURES): Vit D, 25-Hydroxy: 32 ng/mL (ref 30–89)

## 2014-02-26 MED ORDER — HYDROCODONE-ACETAMINOPHEN 5-325 MG PO TABS: ORAL_TABLET | ORAL | Status: DC

## 2014-02-27 ENCOUNTER — Other Ambulatory Visit: Payer: Self-pay | Admitting: Family Medicine

## 2014-03-03 ENCOUNTER — Ambulatory Visit (INDEPENDENT_AMBULATORY_CARE_PROVIDER_SITE_OTHER): Payer: 59 | Admitting: Family Medicine

## 2014-03-03 ENCOUNTER — Encounter: Payer: Self-pay | Admitting: Family Medicine

## 2014-03-03 VITALS — BP 112/80 | HR 91 | Resp 16 | Ht 66.5 in | Wt 206.1 lb

## 2014-03-03 DIAGNOSIS — E669 Obesity, unspecified: Secondary | ICD-10-CM

## 2014-03-03 DIAGNOSIS — R7309 Other abnormal glucose: Secondary | ICD-10-CM

## 2014-03-03 DIAGNOSIS — G4700 Insomnia, unspecified: Secondary | ICD-10-CM

## 2014-03-03 DIAGNOSIS — E785 Hyperlipidemia, unspecified: Secondary | ICD-10-CM

## 2014-03-03 DIAGNOSIS — F32A Depression, unspecified: Secondary | ICD-10-CM

## 2014-03-03 DIAGNOSIS — F329 Major depressive disorder, single episode, unspecified: Secondary | ICD-10-CM

## 2014-03-03 DIAGNOSIS — J3089 Other allergic rhinitis: Secondary | ICD-10-CM

## 2014-03-03 DIAGNOSIS — F3289 Other specified depressive episodes: Secondary | ICD-10-CM

## 2014-03-03 DIAGNOSIS — R7303 Prediabetes: Secondary | ICD-10-CM

## 2014-03-03 DIAGNOSIS — F411 Generalized anxiety disorder: Secondary | ICD-10-CM

## 2014-03-03 DIAGNOSIS — I10 Essential (primary) hypertension: Secondary | ICD-10-CM

## 2014-03-03 DIAGNOSIS — M549 Dorsalgia, unspecified: Secondary | ICD-10-CM

## 2014-03-03 MED ORDER — LORATADINE 10 MG PO TABS: 10.0000 mg | ORAL_TABLET | Freq: Every day | ORAL | Status: DC

## 2014-03-03 MED ORDER — BUSPIRONE HCL 5 MG PO TABS: 5.0000 mg | ORAL_TABLET | Freq: Three times a day (TID) | ORAL | Status: DC

## 2014-03-03 MED ORDER — ALPRAZOLAM 0.25 MG PO TABS: 0.2500 mg | ORAL_TABLET | Freq: Every day | ORAL | Status: DC

## 2014-03-03 MED ORDER — FLUOXETINE HCL 40 MG PO CAPS: 40.0000 mg | ORAL_CAPSULE | Freq: Every day | ORAL | Status: DC

## 2014-03-03 NOTE — Assessment & Plan Note (Signed)
Deteriorated, add bedtime xanax , short term

## 2014-03-03 NOTE — Assessment & Plan Note (Signed)
Untreated, not suicidal or homicidal Increase dose of fluoxetine, may need psych involvement, de[pending on response to current therapy, close f/u in 6 weeks Daily exercise encouraged and therapy

## 2014-03-03 NOTE — Assessment & Plan Note (Signed)
Pt wants to refocus on lifestyle, already starting 2 new meds for mental health, chooses not to resume metformin currently, which is OK

## 2014-03-03 NOTE — Assessment & Plan Note (Signed)
Unchanged, continue current med as before

## 2014-03-03 NOTE — Assessment & Plan Note (Signed)
Controlled, no change in medication  

## 2014-03-03 NOTE — Assessment & Plan Note (Addendum)
Uncontrolled, add buspar and xanax short term Behavioral techniques Therapy through employee health

## 2014-03-03 NOTE — Progress Notes (Signed)
   Subjective:    Patient ID: Mary Roman, female    DOB: [birth year], 43 y.o.   MRN: [id number]  HPI The PT is here for follow up and re-evaluation of chronic medical conditions, medication management and review of any available recent lab and radiology data.  Preventive health is updated, specifically  Cancer screening and Immunization.    The PT denies any adverse reactions to current medications since the last visit.  C/o increased and uncontrolled anxiety , and feels as though fluoxetine is "not helping her"    Review of Systems See HPI Denies recent fever or chills. Denies sinus pressure, nasal congestion, ear pain or sore throat. Denies chest congestion, productive cough or wheezing. Denies chest pains, palpitations and leg swelling Denies abdominal pain, nausea, vomiting,diarrhea or constipation.   Denies dysuria, frequency, hesitancy or incontinence. Chronic back pain is unchanged Denies headaches, seizures, numbness, or tingling.  Denies skin break down or rash.         Objective:   Physical Exam BP 112/80  Pulse 91  Resp 16  Ht 5' 6.5" (1.689 m)  Wt 206 lb 1.9 oz (93.495 kg)  BMI 32.77 kg/m2  SpO2 99% Patient alert and oriented and in no cardiopulmonary distress.  HEENT: No facial asymmetry, EOMI,   oropharynx pink and moist.  Neck supple no JVD, no mass.  Chest: Clear to auscultation bilaterally.  CVS: S1, S2 no murmurs, no S3.Regular rate.  ABD: Soft non tender.   Ext: No edema  MS: Adequate ROM spine, shoulders, hips and knees.  Skin: Intact, no ulcerations or rash noted.  Psych: Good eye contact, normal affect. Memory intact  anxious , at time tearful, not depressed appearing.  CNS: CN 2-12 intact, power,  normal throughout.no focal deficits noted.        Assessment & Plan:  Generalized anxiety disorder Uncontrolled, add buspar and xanax short term Behavioral techniques Therapy through employee health  HYPERTENSION Controlled,  no change in medication DASH diet and commitment to daily physical activity for a minimum of 30 minutes discussed and encouraged, as a part of hypertension management. The importance of attaining a healthy weight is also discussed.   Dyslipidemia (high LDL; low HDL) Deteriorated, behavior modification only  Depression Untreated, not suicidal or homicidal Increase dose of fluoxetine, may need psych involvement, de[pending on response to current therapy, close f/u in 6 weeks Daily exercise encouraged and therapy  OBESITY Deteriorated. Patient re-educated about  the importance of commitment to a  minimum of 150 minutes of exercise per week. The importance of healthy food choices with portion control discussed. Encouraged to start a food diary, count calories and to consider  joining a support group. Sample diet sheets offered. Goals set by the patient for the next several months.     BACK PAIN, CHRONIC Unchanged, continue current med as before  Insomnia Deteriorated, add bedtime xanax , short term  Prediabetes Pt wants to refocus on lifestyle, already starting 2 new meds for mental health, chooses not to resume metformin currently, which is OK  Allergic rhinitis Controlled, no change in medication

## 2014-03-03 NOTE — Assessment & Plan Note (Signed)
Deteriorated. Patient re-educated about  the importance of commitment to a  minimum of 150 minutes of exercise per week. The importance of healthy food choices with portion control discussed. Encouraged to start a food diary, count calories and to consider  joining a support group. Sample diet sheets offered. Goals set by the patient for the next several months.    

## 2014-03-03 NOTE — Assessment & Plan Note (Signed)
Deteriorated, behavior modification only

## 2014-03-03 NOTE — Assessment & Plan Note (Signed)
Controlled, no change in medication DASH diet and commitment to daily physical activity for a minimum of 30 minutes discussed and encouraged, as a part of hypertension management. The importance of attaining a healthy weight is also discussed.  

## 2014-03-03 NOTE — Patient Instructions (Signed)
F/u in 6 weeks, call if you need me before   Dose increase in fluoxetine tio 40mg  daily, OK to take TWO 20mg  capsules daily till done  New for anxiety is buspar, takwe every 8 hours on schedule  New is xanax at bedtime  NEW IS DAILY 30 min exercise with music  Pls call for therapy as dicussed alos  Try to stay away from sweets and increase intake of colored foods, and decrease white foods (carbs)  Practice relaxation techniques  Generalized Anxiety Disorder Generalized anxiety disorder (GAD) is a mental disorder. It interferes with life functions, including relationships, work, and school. GAD is different from normal anxiety, which everyone experiences at some point in their lives in response to specific life events and activities. Normal anxiety actually helps [place] prepare for and get through these life events and activities. Normal anxiety goes away after the event or activity is over.  GAD causes anxiety that is not necessarily related to specific events or activities. It also causes excess anxiety in proportion to specific events or activities. The anxiety associated with GAD is also difficult to control. GAD can vary from mild to severe. People with severe GAD can have intense waves of anxiety with physical symptoms (panic attacks).  SYMPTOMS The anxiety and worry associated with GAD are difficult to control. This anxiety and worry are related to many life events and activities and also occur more days than not for 6 months or longer. People with GAD also have three or more of the following symptoms (one or more in children):  Restlessness.   Fatigue.  Difficulty concentrating.   Irritability.  Muscle tension.  Difficulty sleeping or unsatisfying sleep. DIAGNOSIS GAD is diagnosed through an assessment by your health care provider. Your health care provider will ask you questions aboutyour mood,physical symptoms, and events in your life. Your health care provider may ask  you about your medical history and use of alcohol or drugs, including prescription medicines. Your health care provider may also do a physical exam and blood tests. Certain medical conditions and the use of certain substances can cause symptoms similar to those associated with GAD. Your health care provider may refer you to a mental health specialist for further evaluation. TREATMENT The following therapies are usually used to treat GAD:   Medication. Antidepressant medication usually is prescribed for long-term daily control. Antianxiety medicines may be added in severe cases, especially when panic attacks occur.   Talk therapy (psychotherapy). Certain types of talk therapy can be helpful in treating GAD by providing support, education, and guidance. A form of talk therapy called cognitive behavioral therapy can teach you healthy ways to think about and react to daily life events and activities.  Stress managementtechniques. These include yoga, meditation, and exercise and can be very helpful when they are practiced regularly. A mental health specialist can help determine which treatment is best for you. Some people see improvement with one therapy. However, other people require a combination of therapies. Document Released: 11/10/2012 Document Revised: 11/30/2013 Document Reviewed: 11/10/2012 [facility] Patient Information 2015 [place], [place]. This information is not intended to replace advice given to you by your health care provider. Make sure you discuss any questions you have with your health care provider.

## 2014-03-16 ENCOUNTER — Telehealth: Payer: Self-pay

## 2014-03-16 ENCOUNTER — Other Ambulatory Visit: Payer: Self-pay

## 2014-03-16 MED ORDER — PREDNISONE 5 MG PO TABS: 5.0000 mg | ORAL_TABLET | Freq: Two times a day (BID) | ORAL | Status: DC

## 2014-03-16 MED ORDER — HYDROXYZINE HCL 10 MG PO TABS: 10.0000 mg | ORAL_TABLET | Freq: Every evening | ORAL | Status: DC | PRN

## 2014-03-16 NOTE — Telephone Encounter (Signed)
pls advise and send pred 5 mg twice daily # 6 and hydroxyzine 25 mg one at bedtime as needed # 10

## 2014-03-16 NOTE — Telephone Encounter (Signed)
Patient aware and meds sent 

## 2014-03-16 NOTE — Telephone Encounter (Signed)
Started itching lastnight all over- no rash. Was using a lot of different cleaning supplies over the weekend. Took benadryl lastnight but still feeling tingly and itchy today. Wants to know if you can call her in some hydroxyzine or something to [facility]. Please advise

## 2014-03-25 ENCOUNTER — Other Ambulatory Visit: Payer: Self-pay

## 2014-03-25 MED ORDER — HYDROCODONE-ACETAMINOPHEN 5-325 MG PO TABS: ORAL_TABLET | ORAL | Status: DC

## 2014-04-12 ENCOUNTER — Encounter: Payer: Self-pay | Admitting: Family Medicine

## 2014-04-12 ENCOUNTER — Ambulatory Visit (INDEPENDENT_AMBULATORY_CARE_PROVIDER_SITE_OTHER): Payer: 59 | Admitting: Family Medicine

## 2014-04-12 VITALS — BP 114/74 | HR 87 | Resp 16 | Ht 67.0 in | Wt 206.0 lb

## 2014-04-12 DIAGNOSIS — E8881 Metabolic syndrome: Secondary | ICD-10-CM

## 2014-04-12 DIAGNOSIS — F32A Depression, unspecified: Secondary | ICD-10-CM

## 2014-04-12 DIAGNOSIS — F329 Major depressive disorder, single episode, unspecified: Secondary | ICD-10-CM

## 2014-04-12 DIAGNOSIS — R7309 Other abnormal glucose: Secondary | ICD-10-CM

## 2014-04-12 DIAGNOSIS — E785 Hyperlipidemia, unspecified: Secondary | ICD-10-CM

## 2014-04-12 DIAGNOSIS — I10 Essential (primary) hypertension: Secondary | ICD-10-CM

## 2014-04-12 DIAGNOSIS — J3089 Other allergic rhinitis: Secondary | ICD-10-CM

## 2014-04-12 DIAGNOSIS — F3289 Other specified depressive episodes: Secondary | ICD-10-CM

## 2014-04-12 DIAGNOSIS — M549 Dorsalgia, unspecified: Secondary | ICD-10-CM

## 2014-04-12 DIAGNOSIS — E669 Obesity, unspecified: Secondary | ICD-10-CM

## 2014-04-12 DIAGNOSIS — G4700 Insomnia, unspecified: Secondary | ICD-10-CM

## 2014-04-12 DIAGNOSIS — R7303 Prediabetes: Secondary | ICD-10-CM

## 2014-04-12 DIAGNOSIS — F411 Generalized anxiety disorder: Secondary | ICD-10-CM

## 2014-04-12 HISTORY — DX: Metabolic syndrome: E88.81

## 2014-04-12 HISTORY — DX: Metabolic syndrome: E88.810

## 2014-04-12 MED ORDER — BUSPIRONE HCL 7.5 MG PO TABS: 7.5000 mg | ORAL_TABLET | Freq: Three times a day (TID) | ORAL | Status: DC

## 2014-04-12 NOTE — Assessment & Plan Note (Signed)
Controlled, no change in medication  

## 2014-04-12 NOTE — Assessment & Plan Note (Signed)
Unchanged , adequate control ion current medication

## 2014-04-12 NOTE — Patient Instructions (Addendum)
F/u in end November, call if you need me before  New higher dose of buspar 7.5 mg one three times daily  Please schedule appt as discussed for therapy  Please commit to daily exercise as discussed  Fasting lipid, chem 7 and HBA1C  For Nov visit  BP is excellent , no med change  OK to take TWO xanax tabs if neeeded for uncontrolled anxiety  Ok to take flu vaccine based on provided history

## 2014-04-12 NOTE — Assessment & Plan Note (Signed)
The increased risk of cardiovascular disease associated with this diagnosis, and the need to consistently work on lifestyle to change this is discussed. Following  a  heart healthy diet ,commitment to 30 minutes of exercise at least 5 days per week, as well as control of blood sugar and cholesterol , and achieving a healthy weight are all the areas to be addressed .  

## 2014-04-12 NOTE — Progress Notes (Signed)
Subjective:    Patient ID: Mary Roman, female    DOB: 10-02-1970, 43 y.o.   MRN: [id number]  HPI The PT is here for follow up and re-evaluation of chronic medical conditions, medication management and review of any available recent lab and radiology data.  Preventive health is updated, specifically  Cancer screening and Immunization.    The PT denies any adverse reactions to current medications since the last visit. Has noted some improvement in her GAD with the buspar, denies any adverse s/e , had to take xanax in the PM on occasion , but noted that  the strength prescribed did little for her, she is asking about a stronger dose if needed , and has not tried to double the medication on her own. Her depression is still not as improved as we would hope, she has looked into [employer], this is in [place], so she has not started yet, but is opting to do so, in liu of seeing psychiatry at this time. She is not suicidal or homicidal She is not yet committed to regular exercise. She decompensates most at the end of a workday, in terms of anxiety and eating Has concerns about flu vaccine, states 2 years ago, 2 weeks after the vaccine she got severe respiratory infection, last year no problem       Review of Systems See HPI Denies recent fever or chills. Denies sinus pressure, nasal congestion, ear pain or sore throat. Denies chest congestion, productive cough or wheezing. Denies chest pains, palpitations and leg swelling Denies abdominal pain, nausea, vomiting,diarrhea or constipation.   Denies dysuria, frequency, hesitancy or incontinence. Denies uncontrolled  joint pain, swelling and limitation in mobility. Denies headaches, seizures, numbness, or tingling. Denies skin break down or rash.       . Objective:   Physical Exam  BP 114/74  Pulse 87  Resp 16  Ht 5\' 7"  (1.702 m)  Wt 206 lb (93.441 kg)  BMI 32.26 kg/m2  SpO2 96% Patient alert and oriented and in no  cardiopulmonary distress.  HEENT: No facial asymmetry, EOMI,   oropharynx pink and moist.  Neck supple no JVD, no mass.  Chest: Clear to auscultation bilaterally.  CVS: S1, S2 no murmurs, no S3.Regular rate.  ABD: Soft non tender.   Ext: No edema  MS: Adequate ROM spine, shoulders, hips and knees.  Skin: Intact, no ulcerations or rash noted.  Psych: Good eye contact, normal affect. Memory intact not anxious or depressed appearing.  CNS: CN 2-12 intact, power,  normal throughout.no focal deficits noted.       Assessment & Plan:  Generalized anxiety disorder I mproved but still not at goal. Will increase dose of buspar, and pt to commit to regular physical activity  Depression   Little improvement on max dose of fluoxetine , which is concerning. Pt opting to commit to exercise and therapy before psych eval. She is not suicidal or homicidal  BACK PAIN, CHRONIC Unchanged , adequate control ion current medication  Insomnia Uncontrolled with the low dose xanax, pt back on restoril, dose xanax increased for only as needed, infrequent use. No new script provided at thsi visit  OBESITY Unchanged Patient re-educated about  the importance of commitment to a  minimum of 150 minutes of exercise per week. The importance of healthy food choices with portion control discussed. Encouraged to start a food diary, count calories and to consider  joining a support group. Sample diet sheets offered. Goals set by the patient  for the next several months.     Prediabetes Updated lab needed at/ before next visit.; Patient educated about the importance of limiting  Carbohydrate intake , the need to commit to daily physical activity for a minimum of 30 minutes , and to commit weight loss. The fact that changes in all these areas will reduce or eliminate all together the development of diabetes is stressed.   She has attended diabetic class also  HYPERTENSION Controlled, no change in  medication DASH diet and commitment to daily physical activity for a minimum of 30 minutes discussed and encouraged, as a part of hypertension management. The importance of attaining a healthy weight is also discussed.   Allergic rhinitis Controlled, no change in medication   Dyslipidemia (high LDL; low HDL) Hyperlipidemia:Low fat diet discussed and encouraged.  It is important that you exercise regularly at least 30 minutes 5 times a week. If you develop chest pain, have severe difficulty breathing, or feel very tired, stop exercising immediately and seek medical attention  Updated lab needed at/ before next visit.   Metabolic syndrome X The increased risk of cardiovascular disease associated with this diagnosis, and the need to consistently work on lifestyle to change this is discussed. Following  a  heart healthy diet ,commitment to 30 minutes of exercise at least 5 days per week, as well as control of blood sugar and cholesterol , and achieving a healthy weight are all the areas to be addressed .

## 2014-04-12 NOTE — Assessment & Plan Note (Signed)
Controlled, no change in medication DASH diet and commitment to daily physical activity for a minimum of 30 minutes discussed and encouraged, as a part of hypertension management. The importance of attaining a healthy weight is also discussed.  

## 2014-04-12 NOTE — Assessment & Plan Note (Signed)
Uncontrolled with the low dose xanax, pt back on restoril, dose xanax increased for only as needed, infrequent use. No new script provided at [facility] visit

## 2014-04-12 NOTE — Assessment & Plan Note (Signed)
Hyperlipidemia:Low fat diet discussed and encouraged.  It is important that you exercise regularly at least 30 minutes 5 times a week. If you develop chest pain, have severe difficulty breathing, or feel very tired, stop exercising immediately and seek medical attention  Updated lab needed at/ before next visit.

## 2014-04-12 NOTE — Assessment & Plan Note (Signed)
Updated lab needed at/ before next visit.; Patient educated about the importance of limiting  Carbohydrate intake , the need to commit to daily physical activity for a minimum of 30 minutes , and to commit weight loss. The fact that changes in all these areas will reduce or eliminate all together the development of diabetes is stressed.   She has attended diabetic class also

## 2014-04-12 NOTE — Assessment & Plan Note (Signed)
I mproved but still not at goal. Will increase dose of buspar, and pt to commit to regular physical activity

## 2014-04-12 NOTE — Assessment & Plan Note (Signed)
Unchanged. Patient re-educated about  the importance of commitment to a  minimum of 150 minutes of exercise per week. The importance of healthy food choices with portion control discussed. Encouraged to start a food diary, count calories and to consider  joining a support group. Sample diet sheets offered. Goals set by the patient for the next several months.    

## 2014-04-12 NOTE — Assessment & Plan Note (Signed)
   Little improvement on max dose of fluoxetine , which is concerning. Pt opting to commit to exercise and therapy before psych eval. She is not suicidal or homicidal

## 2014-04-28 ENCOUNTER — Other Ambulatory Visit: Payer: Self-pay

## 2014-04-28 MED ORDER — HYDROCODONE-ACETAMINOPHEN 5-325 MG PO TABS: ORAL_TABLET | ORAL | Status: DC

## 2014-05-25 ENCOUNTER — Other Ambulatory Visit: Payer: Self-pay

## 2014-05-25 MED ORDER — HYDROCODONE-ACETAMINOPHEN 5-325 MG PO TABS: ORAL_TABLET | ORAL | Status: DC

## 2014-05-27 ENCOUNTER — Other Ambulatory Visit: Payer: Self-pay

## 2014-05-27 ENCOUNTER — Telehealth: Payer: Self-pay

## 2014-05-27 DIAGNOSIS — R058 Other specified cough: Secondary | ICD-10-CM

## 2014-05-27 DIAGNOSIS — R05 Cough: Secondary | ICD-10-CM

## 2014-05-27 MED ORDER — HYDROCOD POLST-CHLORPHEN POLST 10-8 MG/5ML PO LQCR: 5.0000 mL | Freq: Two times a day (BID) | ORAL | Status: AC | PRN

## 2014-05-27 NOTE — Telephone Encounter (Signed)
Med refilled and will call pt once signed to collect

## 2014-05-27 NOTE — Telephone Encounter (Signed)
refill x 1 only please

## 2014-06-22 ENCOUNTER — Other Ambulatory Visit: Payer: Self-pay | Admitting: Family Medicine

## 2014-06-22 ENCOUNTER — Other Ambulatory Visit: Payer: Self-pay | Admitting: Cardiovascular Disease

## 2014-06-22 ENCOUNTER — Other Ambulatory Visit: Payer: Self-pay

## 2014-06-22 MED ORDER — PHENTERMINE HCL 37.5 MG PO TABS: ORAL_TABLET | ORAL | Status: DC

## 2014-06-22 MED ORDER — TEMAZEPAM 30 MG PO CAPS: ORAL_CAPSULE | ORAL | Status: DC

## 2014-07-01 ENCOUNTER — Other Ambulatory Visit: Payer: Self-pay

## 2014-07-01 MED ORDER — HYDROCODONE-ACETAMINOPHEN 5-325 MG PO TABS: ORAL_TABLET | ORAL | Status: DC

## 2014-07-05 ENCOUNTER — Ambulatory Visit: Payer: 59 | Admitting: Family Medicine

## 2014-08-06 ENCOUNTER — Other Ambulatory Visit: Payer: Self-pay

## 2014-08-06 MED ORDER — HYDROCODONE-ACETAMINOPHEN 5-325 MG PO TABS: ORAL_TABLET | ORAL | Status: DC

## 2014-08-23 ENCOUNTER — Ambulatory Visit (INDEPENDENT_AMBULATORY_CARE_PROVIDER_SITE_OTHER): Payer: 59 | Admitting: Family Medicine

## 2014-08-23 ENCOUNTER — Encounter: Payer: Self-pay | Admitting: Family Medicine

## 2014-08-23 VITALS — BP 106/78 | HR 95 | Resp 18 | Ht 66.0 in | Wt 207.0 lb

## 2014-08-23 DIAGNOSIS — R7309 Other abnormal glucose: Secondary | ICD-10-CM

## 2014-08-23 DIAGNOSIS — E8881 Metabolic syndrome: Secondary | ICD-10-CM

## 2014-08-23 DIAGNOSIS — F32A Depression, unspecified: Secondary | ICD-10-CM

## 2014-08-23 DIAGNOSIS — R7303 Prediabetes: Secondary | ICD-10-CM

## 2014-08-23 DIAGNOSIS — M5440 Lumbago with sciatica, unspecified side: Secondary | ICD-10-CM

## 2014-08-23 DIAGNOSIS — Z6835 Body mass index (BMI) 35.0-35.9, adult: Secondary | ICD-10-CM

## 2014-08-23 DIAGNOSIS — F411 Generalized anxiety disorder: Secondary | ICD-10-CM

## 2014-08-23 DIAGNOSIS — J0100 Acute maxillary sinusitis, unspecified: Secondary | ICD-10-CM

## 2014-08-23 DIAGNOSIS — F329 Major depressive disorder, single episode, unspecified: Secondary | ICD-10-CM

## 2014-08-23 DIAGNOSIS — J309 Allergic rhinitis, unspecified: Secondary | ICD-10-CM

## 2014-08-23 DIAGNOSIS — I10 Essential (primary) hypertension: Secondary | ICD-10-CM

## 2014-08-23 MED ORDER — AZITHROMYCIN 250 MG PO TABS: ORAL_TABLET | ORAL | Status: DC

## 2014-08-23 NOTE — Assessment & Plan Note (Addendum)
Pt starting counseling this week, and hopeful that this will make things better, she may need psychiatry to help with med management

## 2014-08-23 NOTE — Assessment & Plan Note (Signed)
Updated lab needed at/ before next visit. Patient educated about the importance of limiting  Carbohydrate intake , the need to commit to daily physical activity for a minimum of 30 minutes , and to commit weight loss. The fact that changes in all these areas will reduce or eliminate all together the development of diabetes is stressed.    

## 2014-08-23 NOTE — Assessment & Plan Note (Signed)
Unchanged , adequately controlled on current medication

## 2014-08-23 NOTE — Patient Instructions (Signed)
F/u in 3 month, call if you need me before  Change diet please  Listen for results, very important, if diabetes is confirmed, you need to start metformin    Use loratidine and flonase every day for allergy control also saline nasal sprays  Z pack prescribed for sinusitis ( right maxillary)  HBa1C, chem 7 and EGFR nex t visit  Consider sleep study  It is important that you exercise regularly at least 30 minutes 5 times a week. If you develop chest pain, have severe difficulty breathing, or feel very tired, stop exercising immediately and seek medical attention    Weight loss goal of 10 pounds

## 2014-08-23 NOTE — Assessment & Plan Note (Signed)
Deteriorated. Patient re-educated about  the importance of commitment to a  minimum of 150 minutes of exercise per week. The importance of healthy food choices with portion control discussed. Encouraged to start a food diary, count calories and to consider  joining a support group. Sample diet sheets offered. Goals set by the patient for the next several months.    

## 2014-08-23 NOTE — Assessment & Plan Note (Signed)
Controlled, no change in medication DASH diet and commitment to daily physical activity for a minimum of 30 minutes discussed and encouraged, as a part of hypertension management. The importance of attaining a healthy weight is also discussed.  

## 2014-08-23 NOTE — Assessment & Plan Note (Signed)
Uncontrolled, non compliant , re educated re the need to take meds daily and start saline nose sprays

## 2014-08-23 NOTE — Assessment & Plan Note (Signed)
Improved and controlled on current med, no change

## 2014-08-23 NOTE — Assessment & Plan Note (Signed)
The increased risk of cardiovascular disease associated with this diagnosis, and the need to consistently work on lifestyle to change this is discussed. Following  a  heart healthy diet ,commitment to 30 minutes of exercise at least 5 days per week, as well as control of blood sugar and cholesterol , and achieving a healthy weight are all the areas to be addressed .  

## 2014-08-23 NOTE — Progress Notes (Signed)
Subjective:    Patient ID: Mary Roman, female    DOB: 1970-08-11, 44 y.o.   MRN: [id number]  HPI The PT is here for follow up and re-evaluation of chronic medical conditions, medication management and review of any available recent lab and radiology data.  Preventive health is updated, specifically  Cancer screening and Immunization.   Has an appt with mental health counseling through employment this Thursday and is looking forward to this. Many times she has to push herself to function and still has a high PHQ 9 score, I do believe that psychiatry will need to help with medication management also Has been making poor food choices, has gained weight and is now likely a diabetic, she understands tat she will need to start metformin if she is. Has chronic fatigue and snores, holding off on sleep evaluation at this time, though I recommend stronngly     Review of Systems See HPI Denies recent fever or chills.c/o chronic fatigue C/o right  Maxillary  sinus pressure, with green drainage for past 3 to 5 days, chronioc  nasal congestion,not using allergy meds daily as precribed, and needs to start nasal flushes, denies  ear pain or sore throat. Denies chest congestion, productive cough or wheezing. Denies chest pains, palpitations and leg swelling Denies abdominal pain, nausea, vomiting,diarrhea or constipation.   Denies dysuria, frequency, hesitancy or incontinence. Denies uncontrolled  back pain, she does limit her mobility to prevent pain flares Denies headaches, seizures, numbness, or tingling.  Denies skin break down or rash.        Objective:   Physical Exam  BP 106/78 mmHg  Pulse 95  Resp 18  Ht 5\' 6"  (1.676 m)  Wt 207 lb (93.895 kg)  BMI 33.43 kg/m2  SpO2 98%  Patient alert and oriented and in no cardiopulmonary distress.  HEENT: No facial asymmetry, EOMI,   oropharynx pink and moist.  Neck supple no JVD, no mass. Right maxillary sinus tenderness, TM  clear Chest: Clear to auscultation bilaterally.  CVS: S1, S2 no murmurs, no S3.Regular rate.  ABD: Soft non tender.   Ext: No edema  MS: Adequate ROM spine, shoulders, hips and knees.  Skin: Intact, no ulcerations or rash noted.  Psych: Good eye contact, normal affect. Memory intact not anxious mildly depressed appearing.  CNS: CN 2-12 intact, power,  normal throughout.no focal deficits noted.       Assessment & Plan:  Maxillary sinusitis, acute 5 day h/o pressure over cheeks, with green nasal drainage , no chills or fever, z pack prescribed   Allergic rhinitis Uncontrolled, non compliant , re educated re the need to take meds daily and start saline nose sprays   Essential hypertension Controlled, no change in medication DASH diet and commitment to daily physical activity for a minimum of 30 minutes discussed and encouraged, as a part of hypertension management. The importance of attaining a healthy weight is also discussed.    Prediabetes Updated lab needed at/ before next visit. Patient educated about the importance of limiting  Carbohydrate intake , the need to commit to daily physical activity for a minimum of 30 minutes , and to commit weight loss. The fact that changes in all these areas will reduce or eliminate all together the development of diabetes is stressed.      Backache Unchanged , adequately controlled on current medication   Depression Pt starting counseling this week, and hopeful that this will make things better, she may need psychiatry to help  with med management   Metabolic syndrome X The increased risk of cardiovascular disease associated with this diagnosis, and the need to consistently work on lifestyle to change this is discussed. Following  a  heart healthy diet ,commitment to 30 minutes of exercise at least 5 days per week, as well as control of blood sugar and cholesterol , and achieving a healthy weight are all the areas to be  addressed .    Severe obesity (BMI 35.0-35.9 with comorbidity) Deteriorated. Patient re-educated about  the importance of commitment to a  minimum of 150 minutes of exercise per week. The importance of healthy food choices with portion control discussed. Encouraged to start a food diary, count calories and to consider  joining a support group. Sample diet sheets offered. Goals set by the patient for the next several months.      Generalized anxiety disorder Improved and controlled on current med, no change

## 2014-08-23 NOTE — Assessment & Plan Note (Addendum)
5 day h/o pressure over cheeks, with green nasal drainage , no chills or fever, z pack prescribed

## 2014-09-01 LAB — HEMOGLOBIN A1C
Hgb A1c MFr Bld: 6.4 % — ABNORMAL HIGH
Mean Plasma Glucose: 137 mg/dL — ABNORMAL HIGH

## 2014-09-01 LAB — LIPID PANEL
Cholesterol: 156 mg/dL (ref 0–200)
HDL: 39 mg/dL — ABNORMAL LOW
LDL Cholesterol: 107 mg/dL — ABNORMAL HIGH (ref 0–99)
Total CHOL/HDL Ratio: 4 ratio
Triglycerides: 49 mg/dL
VLDL: 10 mg/dL (ref 0–40)

## 2014-09-01 LAB — BASIC METABOLIC PANEL
BUN: 13 mg/dL (ref 6–23)
CALCIUM: 9.2 mg/dL (ref 8.4–10.5)
CO2: 29 mEq/L (ref 19–32)
CREATININE: 0.86 mg/dL (ref 0.50–1.10)
Chloride: 101 mEq/L (ref 96–112)
GLUCOSE: 120 mg/dL — AB (ref 70–99)
Potassium: 4 mEq/L (ref 3.5–5.3)
Sodium: 139 mEq/L (ref 135–145)

## 2014-09-02 ENCOUNTER — Other Ambulatory Visit: Payer: Self-pay

## 2014-09-02 DIAGNOSIS — F411 Generalized anxiety disorder: Secondary | ICD-10-CM

## 2014-09-02 MED ORDER — ALPRAZOLAM 0.25 MG PO TABS: 0.2500 mg | ORAL_TABLET | Freq: Every day | ORAL | Status: DC

## 2014-09-03 ENCOUNTER — Encounter: Payer: Self-pay | Admitting: Family Medicine

## 2014-09-03 ENCOUNTER — Other Ambulatory Visit: Payer: Self-pay

## 2014-09-03 MED ORDER — HYDROCODONE-ACETAMINOPHEN 5-325 MG PO TABS: ORAL_TABLET | ORAL | Status: DC

## 2014-09-23 ENCOUNTER — Other Ambulatory Visit: Payer: Self-pay

## 2014-09-23 ENCOUNTER — Telehealth: Payer: Self-pay

## 2014-09-23 MED ORDER — HYDROXYZINE HCL 10 MG PO TABS: 10.0000 mg | ORAL_TABLET | Freq: Every evening | ORAL | Status: DC | PRN

## 2014-09-23 NOTE — Telephone Encounter (Signed)
Patient aware and med sent to pharmacy.  

## 2014-09-23 NOTE — Telephone Encounter (Signed)
pls refill x 2 and let her know , thanks

## 2014-10-05 ENCOUNTER — Other Ambulatory Visit: Payer: Self-pay

## 2014-10-05 MED ORDER — HYDROCODONE-ACETAMINOPHEN 5-325 MG PO TABS: ORAL_TABLET | ORAL | Status: DC

## 2014-10-10 ENCOUNTER — Other Ambulatory Visit: Payer: Self-pay | Admitting: Family Medicine

## 2014-10-11 ENCOUNTER — Telehealth: Payer: Self-pay | Admitting: Family Medicine

## 2014-10-11 MED ORDER — PHENTERMINE HCL 37.5 MG PO TABS: ORAL_TABLET | ORAL | Status: DC

## 2014-10-11 MED ORDER — TEMAZEPAM 30 MG PO CAPS: ORAL_CAPSULE | ORAL | Status: DC

## 2014-10-11 NOTE — Telephone Encounter (Signed)
Med has been faxed to [facility]

## 2014-10-29 ENCOUNTER — Other Ambulatory Visit: Payer: Self-pay

## 2014-10-29 DIAGNOSIS — F411 Generalized anxiety disorder: Secondary | ICD-10-CM

## 2014-10-29 MED ORDER — BUSPIRONE HCL 7.5 MG PO TABS: 7.5000 mg | ORAL_TABLET | Freq: Three times a day (TID) | ORAL | Status: DC

## 2014-11-05 ENCOUNTER — Other Ambulatory Visit: Payer: Self-pay

## 2014-11-05 MED ORDER — HYDROCODONE-ACETAMINOPHEN 5-325 MG PO TABS: ORAL_TABLET | ORAL | Status: DC

## 2014-11-17 ENCOUNTER — Other Ambulatory Visit: Payer: Self-pay | Admitting: Family Medicine

## 2014-12-09 ENCOUNTER — Other Ambulatory Visit: Payer: Self-pay

## 2014-12-09 MED ORDER — HYDROCODONE-ACETAMINOPHEN 5-325 MG PO TABS: ORAL_TABLET | ORAL | Status: DC

## 2014-12-20 ENCOUNTER — Ambulatory Visit (INDEPENDENT_AMBULATORY_CARE_PROVIDER_SITE_OTHER): Payer: 59 | Admitting: Family Medicine

## 2014-12-20 ENCOUNTER — Encounter: Payer: Self-pay | Admitting: Family Medicine

## 2014-12-20 VITALS — BP 122/80 | HR 83 | Resp 16 | Ht 66.0 in | Wt 211.0 lb

## 2014-12-20 DIAGNOSIS — M545 Low back pain: Secondary | ICD-10-CM

## 2014-12-20 DIAGNOSIS — E785 Hyperlipidemia, unspecified: Secondary | ICD-10-CM

## 2014-12-20 DIAGNOSIS — E8881 Metabolic syndrome: Secondary | ICD-10-CM

## 2014-12-20 DIAGNOSIS — R7309 Other abnormal glucose: Secondary | ICD-10-CM

## 2014-12-20 DIAGNOSIS — F329 Major depressive disorder, single episode, unspecified: Secondary | ICD-10-CM

## 2014-12-20 DIAGNOSIS — I10 Essential (primary) hypertension: Secondary | ICD-10-CM | POA: Diagnosis not present

## 2014-12-20 DIAGNOSIS — R7303 Prediabetes: Secondary | ICD-10-CM

## 2014-12-20 DIAGNOSIS — Z6835 Body mass index (BMI) 35.0-35.9, adult: Secondary | ICD-10-CM

## 2014-12-20 DIAGNOSIS — F32A Depression, unspecified: Secondary | ICD-10-CM

## 2014-12-20 DIAGNOSIS — F411 Generalized anxiety disorder: Secondary | ICD-10-CM

## 2014-12-20 DIAGNOSIS — G4700 Insomnia, unspecified: Secondary | ICD-10-CM

## 2014-12-20 DIAGNOSIS — E784 Other hyperlipidemia: Secondary | ICD-10-CM

## 2014-12-20 MED ORDER — ALPRAZOLAM 0.25 MG PO TABS: 0.2500 mg | ORAL_TABLET | Freq: Every day | ORAL | Status: DC

## 2014-12-20 NOTE — Assessment & Plan Note (Signed)
Sleep hygiene reviewed and written information offered also. Prescription sent for  medication needed. Sleeps 8  Hrs daily

## 2014-12-20 NOTE — Assessment & Plan Note (Addendum)
Controlled, no change in medication Improved with therapy, pt to continue same, no referral to psychiatry indicated

## 2014-12-20 NOTE — Assessment & Plan Note (Signed)
Hyperlipidemia:Low fat diet discussed and encouraged.   Lipid Panel  Lab Results  Component Value Date   CHOL 156 08/31/2014   HDL 39* 08/31/2014   LDLCALC 107* 08/31/2014   TRIG 49 08/31/2014   CHOLHDL 4.0 08/31/2014

## 2014-12-20 NOTE — Assessment & Plan Note (Signed)
Marked improvement with therapy, no change in meds, also involved in [place] to start wlaking 5 days per week for 30 mins

## 2014-12-20 NOTE — Assessment & Plan Note (Signed)
Unchanged, continue current meds 

## 2014-12-20 NOTE — Patient Instructions (Signed)
F/u mid September, call if you need me before  Fasting lipid, cmp and EGFr and HBA1C June 6   Expect to start metformin if HBa1C is over 6.5  Or more  Thankful thatyou are doing better  Please work on good  health habits so that your health will improve. 1. Commitment to daily physical activity for 30 to 60  minutes, if you are able to do this.  2. Commitment to wise food choices. Aim for half of your  food intake to be vegetable and fruit, one quarter starchy foods, and one quarter protein. Try to eat on a regular schedule  3 meals per day, snacking between meals should be limited to vegetables or fruits or small portions of nuts. 64 ounces of water per day is generally recommended, unless you have specific health conditions, like heart failure or kidney failure where you will need to limit fluid intake.  3. Commitment to sufficient and a  good quality of physical and mental rest daily, generally between 6 to 8 hours per day.  WITH PERSISTANCE AND PERSEVERANCE, THE IMPOSSIBLE , BECOMES THE NORM!  Thanks for choosing [facility], we consider it a privelige to serve you.

## 2014-12-20 NOTE — Assessment & Plan Note (Signed)
Deteriorated. Patient re-educated about  the importance of commitment to a  minimum of 150 minutes of exercise per week.  The importance of healthy food choices with portion control discussed. Encouraged to start a food diary, count calories and to consider  joining a support group. Sample diet sheets offered. Goals set by the patient for the next several months.   Weight /BMI 12/20/2014 08/23/2014 04/12/2014  WEIGHT 211 lb 207 lb 206 lb  HEIGHT 5\' 6"  5\' 6"  5\' 7"   BMI 34.07 kg/m2 33.43 kg/m2 32.26 kg/m2    Current exercise per week 60 minutes.

## 2014-12-20 NOTE — Assessment & Plan Note (Signed)
Controlled, no change in medication  DASH diet and commitment to daily physical activity for a minimum of 30 minutes discussed and encouraged, as a part of hypertension management. The importance of attaining a healthy weight is also discussed.  BP/Weight 12/20/2014 08/23/2014 04/12/2014 03/03/2014 12/16/2013 08/19/2013 67/70/3403  Systolic BP 524 818 590 931 121 624 469  Diastolic BP 80 78 74 80 74 80 80  Wt. (Lbs) 211 207 206 206.12 203 201 197.12  BMI 34.07 33.43 32.26 32.77 34.03 31.96 31.34

## 2014-12-20 NOTE — Assessment & Plan Note (Signed)
The increased risk of cardiovascular disease associated with this diagnosis, and the need to consistently work on lifestyle to change this is discussed. Following  a  heart healthy diet ,commitment to 30 minutes of exercise at least 5 days per week, as well as control of blood sugar and cholesterol , and achieving a healthy weight are all the areas to be addressed .  

## 2014-12-20 NOTE — Progress Notes (Signed)
Mary Roman     MRN: [id number]      DOB: 1970/09/16   HPI [name] is here for follow up and re-evaluation of chronic medical conditions, medication management and review of any available recent lab and radiology data.  Preventive health is updated, specifically  Cancer screening and Immunization.   Questions or concerns regarding consultations or procedures which the PT has had in the interim are  addressed. The PT denies any adverse reactions to current medications since the last visit.  There are no new concerns.  There are no specific complaints   ROS Denies recent fever or chills. Denies sinus pressure, nasal congestion, ear pain or sore throat. Denies chest congestion, productive cough or wheezing. Denies chest pains, palpitations and leg swelling Denies abdominal pain, nausea, vomiting,diarrhea or constipation.   Denies dysuria, frequency, hesitancy or incontinence. Denies joint pain, swelling and limitation in mobility. Denies headaches, seizures, numbness, or tingling. Denies depression, anxiety or insomnia. Denies skin break down or rash.   PE  BP 122/80 mmHg  Pulse 83  Resp 16  Ht 5\' 6"  (1.676 m)  Wt 211 lb (95.709 kg)  BMI 34.07 kg/m2  SpO2 100%  Patient alert and oriented and in no cardiopulmonary distress.  HEENT: No facial asymmetry, EOMI,   oropharynx pink and moist.  Neck supple no JVD, no mass.  Chest: Clear to auscultation bilaterally.  CVS: S1, S2 no murmurs, no S3.Regular rate.  ABD: Soft non tender.   Ext: No edema  MS: Adequate ROM spine, shoulders, hips and knees.  Skin: Intact, no ulcerations or rash noted.  Psych: Good eye contact, normal affect. Memory intact not anxious or depressed appearing.  CNS: CN 2-12 intact, power,  normal throughout.no focal deficits noted.   Assessment & Plan   **Depression Marked improvement with therapy, no change in meds, also involved in [place] to start wlaking 5 days per week  for 30 mins   Essential hypertension Controlled, no change in medication  DASH diet and commitment to daily physical activity for a minimum of 30 minutes discussed and encouraged, as a part of hypertension management. The importance of attaining a healthy weight is also discussed.  BP/Weight 12/20/2014 08/23/2014 04/12/2014 03/03/2014 12/16/2013 08/19/2013 18/29/9371  Systolic BP 696 789 381 017 510 258 527  Diastolic BP 80 78 74 80 74 80 80  Wt. (Lbs) 211 207 206 206.12 203 201 197.12  BMI 34.07 33.43 32.26 32.77 34.03 31.96 31.34         Severe obesity (BMI 35.0-35.9 with comorbidity) Deteriorated. Patient re-educated about  the importance of commitment to a  minimum of 150 minutes of exercise per week.  The importance of healthy food choices with portion control discussed. Encouraged to start a food diary, count calories and to consider  joining a support group. Sample diet sheets offered. Goals set by the patient for the next several months.   Weight /BMI 12/20/2014 08/23/2014 04/12/2014  WEIGHT 211 lb 207 lb 206 lb  HEIGHT 5\' 6"  5\' 6"  5\' 7"   BMI 34.07 kg/m2 33.43 kg/m2 32.26 kg/m2    Current exercise per week 60 minutes.    Metabolic syndrome X The increased risk of cardiovascular disease associated with this diagnosis, and the need to consistently work on lifestyle to change this is discussed. Following  a  heart healthy diet ,commitment to 30 minutes of exercise at least 5 days per week, as well as control of blood sugar and cholesterol , and achieving  a healthy weight are all the areas to be addressed .    Insomnia Sleep hygiene reviewed and written information offered also. Prescription sent for  medication needed. Sleeps 8  Hrs daily   Generalized anxiety disorder Controlled, no change in medication Improved with therapy, pt to continue same, no referral to psychiatry indicated   Backache Unchanged , continue current meds   Prediabetes Updated lab needed  at/ before next visit.  Patient educated about the importance of limiting  Carbohydrate intake , the need to commit to daily physical activity for a minimum of 30 minutes , and to commit weight loss. The fact that changes in all these areas will reduce or eliminate all together the development of diabetes is stressed.   Diabetic Labs Latest Ref Rng 08/31/2014 02/25/2014 02/19/2013 09/22/2012 09/22/2012  HbA1c <5.7 % 6.4(H) 6.6(H) 6.1(H) - 6.3(H)  Chol 0 - 200 mg/dL 156 153 - - 157  HDL >39 mg/dL 39(L) 35(L) - - 42  Calc LDL 0 - 99 mg/dL 107(H) 106(H) - - 104(H)  Triglycerides <150 mg/dL 49 60 - - 54  Creatinine 0.50 - 1.10 mg/dL 0.86 0.84 0.85 1.33(H) 0.72   BP/Weight 12/20/2014 08/23/2014 04/12/2014 03/03/2014 12/16/2013 08/19/2013 44/96/7591  Systolic BP 638 466 599 357 017 793 903  Diastolic BP 80 78 74 80 74 80 80  Wt. (Lbs) 211 207 206 206.12 203 201 197.12  BMI 34.07 33.43 32.26 32.77 34.03 31.96 31.34   Foot/eye exam completion dates 08/23/2014  Foot Form Completion Done        Dyslipidemia (high LDL; low HDL) Hyperlipidemia:Low fat diet discussed and encouraged.   Lipid Panel  Lab Results  Component Value Date   CHOL 156 08/31/2014   HDL 39* 08/31/2014   LDLCALC 107* 08/31/2014   TRIG 49 08/31/2014   CHOLHDL 4.0 08/31/2014          *

## 2014-12-20 NOTE — Assessment & Plan Note (Signed)
Updated lab needed at/ before next visit.  Patient educated about the importance of limiting  Carbohydrate intake , the need to commit to daily physical activity for a minimum of 30 minutes , and to commit weight loss. The fact that changes in all these areas will reduce or eliminate all together the development of diabetes is stressed.   Diabetic Labs Latest Ref Rng 08/31/2014 02/25/2014 02/19/2013 09/22/2012 09/22/2012  HbA1c <5.7 % 6.4(H) 6.6(H) 6.1(H) - 6.3(H)  Chol 0 - 200 mg/dL 156 153 - - 157  HDL >39 mg/dL 39(L) 35(L) - - 42  Calc LDL 0 - 99 mg/dL 107(H) 106(H) - - 104(H)  Triglycerides <150 mg/dL 49 60 - - 54  Creatinine 0.50 - 1.10 mg/dL 0.86 0.84 0.85 1.33(H) 0.72   BP/Weight 12/20/2014 08/23/2014 04/12/2014 03/03/2014 12/16/2013 08/19/2013 96/28/3662  Systolic BP 947 654 650 354 656 812 751  Diastolic BP 80 78 74 80 74 80 80  Wt. (Lbs) 211 207 206 206.12 203 201 197.12  BMI 34.07 33.43 32.26 32.77 34.03 31.96 31.34   Foot/eye exam completion dates 08/23/2014  Foot Form Completion Done

## 2014-12-29 ENCOUNTER — Other Ambulatory Visit: Payer: Self-pay

## 2014-12-29 MED ORDER — HYDROCODONE-ACETAMINOPHEN 5-325 MG PO TABS: ORAL_TABLET | ORAL | Status: DC

## 2015-01-28 ENCOUNTER — Other Ambulatory Visit: Payer: Self-pay

## 2015-01-28 MED ORDER — HYDROCODONE-ACETAMINOPHEN 5-325 MG PO TABS: ORAL_TABLET | ORAL | Status: DC

## 2015-02-02 ENCOUNTER — Other Ambulatory Visit: Payer: Self-pay

## 2015-02-02 ENCOUNTER — Other Ambulatory Visit: Payer: Self-pay | Admitting: Adult Health

## 2015-02-02 MED ORDER — PHENTERMINE HCL 37.5 MG PO TABS: ORAL_TABLET | ORAL | Status: DC

## 2015-02-08 ENCOUNTER — Other Ambulatory Visit: Payer: Self-pay

## 2015-03-04 ENCOUNTER — Other Ambulatory Visit: Payer: Self-pay

## 2015-03-04 MED ORDER — HYDROCODONE-ACETAMINOPHEN 5-325 MG PO TABS: ORAL_TABLET | ORAL | Status: DC

## 2015-03-08 ENCOUNTER — Other Ambulatory Visit: Payer: Self-pay | Admitting: Family Medicine

## 2015-03-08 DIAGNOSIS — Z1231 Encounter for screening mammogram for malignant neoplasm of breast: Secondary | ICD-10-CM

## 2015-03-14 ENCOUNTER — Ambulatory Visit (HOSPITAL_COMMUNITY)
Admission: RE | Admit: 2015-03-14 | Discharge: 2015-03-14 | Disposition: A | Discharge status: Home / Self Care | Payer: 59 | Source: Ambulatory Visit | Attending: Family Medicine | Admitting: Family Medicine

## 2015-03-14 DIAGNOSIS — Z1231 Encounter for screening mammogram for malignant neoplasm of breast: Secondary | ICD-10-CM | POA: Diagnosis present

## 2015-03-24 ENCOUNTER — Telehealth: Payer: Self-pay

## 2015-03-24 MED ORDER — PREDNISONE 5 MG PO TABS: 5.0000 mg | ORAL_TABLET | Freq: Two times a day (BID) | ORAL | Status: DC

## 2015-03-24 MED ORDER — PROMETHAZINE-DM 6.25-15 MG/5ML PO SYRP: 5.0000 mL | ORAL_SOLUTION | Freq: Every evening | ORAL | Status: DC | PRN

## 2015-03-24 MED ORDER — BENZONATATE 100 MG PO CAPS: 100.0000 mg | ORAL_CAPSULE | Freq: Three times a day (TID) | ORAL | Status: DC | PRN

## 2015-03-24 NOTE — Telephone Encounter (Signed)
Patient aware and meds sent 

## 2015-03-24 NOTE — Telephone Encounter (Signed)
pls send tesslon perles 100 mg 3 times daily for 1 week, pred 5 mg one twice daily for 5 days, and phenergan dm one tsp at bedtime as needed x 112 cc

## 2015-03-24 NOTE — Addendum Note (Signed)
Addended by: Denman George B on: 03/24/2015 12:10 PM   Modules accepted: Orders

## 2015-04-08 ENCOUNTER — Other Ambulatory Visit: Payer: Self-pay

## 2015-04-08 MED ORDER — HYDROCODONE-ACETAMINOPHEN 5-325 MG PO TABS: ORAL_TABLET | ORAL | Status: DC

## 2015-04-21 ENCOUNTER — Other Ambulatory Visit: Payer: Self-pay

## 2015-04-21 MED ORDER — TEMAZEPAM 30 MG PO CAPS: ORAL_CAPSULE | ORAL | Status: DC

## 2015-04-25 ENCOUNTER — Ambulatory Visit (INDEPENDENT_AMBULATORY_CARE_PROVIDER_SITE_OTHER): Payer: 59 | Admitting: Family Medicine

## 2015-04-25 ENCOUNTER — Encounter: Payer: Self-pay | Admitting: Family Medicine

## 2015-04-25 VITALS — BP 118/78 | HR 78 | Resp 16 | Ht 66.0 in | Wt 216.0 lb

## 2015-04-25 DIAGNOSIS — F411 Generalized anxiety disorder: Secondary | ICD-10-CM | POA: Diagnosis not present

## 2015-04-25 DIAGNOSIS — R7303 Prediabetes: Secondary | ICD-10-CM

## 2015-04-25 DIAGNOSIS — I10 Essential (primary) hypertension: Secondary | ICD-10-CM

## 2015-04-25 DIAGNOSIS — M545 Low back pain: Secondary | ICD-10-CM

## 2015-04-25 DIAGNOSIS — E66811 Obesity, class 1: Secondary | ICD-10-CM

## 2015-04-25 DIAGNOSIS — G4700 Insomnia, unspecified: Secondary | ICD-10-CM

## 2015-04-25 DIAGNOSIS — R7309 Other abnormal glucose: Secondary | ICD-10-CM

## 2015-04-25 DIAGNOSIS — F329 Major depressive disorder, single episode, unspecified: Secondary | ICD-10-CM

## 2015-04-25 DIAGNOSIS — Z1159 Encounter for screening for other viral diseases: Secondary | ICD-10-CM | POA: Diagnosis not present

## 2015-04-25 DIAGNOSIS — F32A Depression, unspecified: Secondary | ICD-10-CM

## 2015-04-25 DIAGNOSIS — E669 Obesity, unspecified: Secondary | ICD-10-CM

## 2015-04-25 MED ORDER — PHENTERMINE HCL 37.5 MG PO TABS: 37.5000 mg | ORAL_TABLET | Freq: Every day | ORAL | Status: DC

## 2015-04-25 MED ORDER — IBUPROFEN 800 MG PO TABS: 800.0000 mg | ORAL_TABLET | Freq: Three times a day (TID) | ORAL | Status: DC

## 2015-04-25 MED ORDER — METHYLPREDNISOLONE ACETATE 80 MG/ML IJ SUSP
80.0000 mg | Freq: Once | INTRAMUSCULAR | Status: AC
  Administered 2015-04-25: 80 mg via INTRAMUSCULAR

## 2015-04-25 MED ORDER — PREDNISONE 5 MG PO TABS: 5.0000 mg | ORAL_TABLET | Freq: Two times a day (BID) | ORAL | Status: DC

## 2015-04-25 MED ORDER — KETOROLAC TROMETHAMINE 60 MG/2ML IM SOLN
60.0000 mg | Freq: Once | INTRAMUSCULAR | Status: AC
  Administered 2015-04-25: 60 mg via INTRAMUSCULAR

## 2015-04-25 MED ORDER — METHOCARBAMOL 500 MG PO TABS: 500.0000 mg | ORAL_TABLET | Freq: Three times a day (TID) | ORAL | Status: DC

## 2015-04-25 MED ORDER — METOPROLOL TARTRATE 25 MG PO TABS: ORAL_TABLET | ORAL | Status: DC

## 2015-04-25 NOTE — Assessment & Plan Note (Signed)
Updated lab needed at/ before next visit. Patient educated about the importance of limiting  Carbohydrate intake , the need to commit to daily physical activity for a minimum of 30 minutes , and to commit weight loss. The fact that changes in all these areas will reduce or eliminate all together the development of diabetes is stressed.   Diabetic Labs Latest Ref Rng 08/31/2014 02/25/2014 02/19/2013 09/22/2012 09/22/2012  HbA1c <5.7 % 6.4(H) 6.6(H) 6.1(H) - 6.3(H)  Chol 0 - 200 mg/dL 156 153 - - 157  HDL >39 mg/dL 39(L) 35(L) - - 42  Calc LDL 0 - 99 mg/dL 107(H) 106(H) - - 104(H)  Triglycerides <150 mg/dL 49 60 - - 54  Creatinine 0.50 - 1.10 mg/dL 0.86 0.84 0.85 1.33(H) 0.72   BP/Weight 04/25/2015 12/20/2014 08/23/2014 04/12/2014 03/03/2014 12/16/2013 2/33/0076  Systolic BP 226 333 545 625 638 937 342  Diastolic BP 78 80 78 74 80 74 80  Wt. (Lbs) 216 211 207 206 206.12 203 201  BMI 34.88 34.07 33.43 32.26 32.77 34.03 31.96   Foot/eye exam completion dates 08/23/2014  Foot Form Completion Done

## 2015-04-25 NOTE — Assessment & Plan Note (Signed)
Uncontrolled.Toradol and depo medrol administered IM in the office , to be followed by a short course of oral prednisone and NSAIDS. Robaxin prescribed for as needed use for spasm also

## 2015-04-25 NOTE — Assessment & Plan Note (Signed)
Controlled, no change in medication DASH diet and commitment to daily physical activity for a minimum of 30 minutes discussed and encouraged, as a part of hypertension management. The importance of attaining a healthy weight is also discussed.  BP/Weight 04/25/2015 12/20/2014 08/23/2014 04/12/2014 03/03/2014 12/16/2013 7/82/4235  Systolic BP 361 443 154 008 676 195 093  Diastolic BP 78 80 78 74 80 74 80  Wt. (Lbs) 216 211 207 206 206.12 203 201  BMI 34.88 34.07 33.43 32.26 32.77 34.03 31.96

## 2015-04-25 NOTE — Assessment & Plan Note (Signed)
Controlled, no change in medication Improved 

## 2015-04-25 NOTE — Assessment & Plan Note (Signed)
Deteriorated. Patient re-educated about  the importance of commitment to a  minimum of 150 minutes of exercise per week.  The importance of healthy food choices with portion control discussed. Encouraged to start a food diary, count calories and to consider  joining a support group. Sample diet sheets offered. Goals set by the patient for the next several months.   Weight /BMI 04/25/2015 12/20/2014 08/23/2014  WEIGHT 216 lb 211 lb 207 lb  HEIGHT 5\' 6"  5\' 6"  5\' 6"   BMI 34.88 kg/m2 34.07 kg/m2 33.43 kg/m2    Current exercise per week 60 minutes.

## 2015-04-25 NOTE — Assessment & Plan Note (Signed)
Sleep hygiene reviewed and written information offered also. Prescription sent for  medication needed.  

## 2015-04-25 NOTE — Assessment & Plan Note (Signed)
Controlled, no change in medication  

## 2015-04-25 NOTE — Progress Notes (Signed)
Subjective:    Patient ID: Mary Roman, female    DOB: 1971/01/30, 44 y.o.   MRN: [id number]  HPI   Mary Roman     MRN: [id number]      DOB: 09-18-1970   HPI Ms. Spires is here for follow up and re-evaluation of chronic medical conditions, medication management and review of any available recent lab and radiology data.  Preventive health is updated, specifically  Cancer screening and Immunization.   Questions or concerns regarding consultations or procedures which the PT has had in the interim are  addressed. The PT denies any adverse reactions to current medications since the last visit.  2 day h/o increased back pain and spasm, wants help for this Feels overeating is due to "stress" , less depressed, just a hectic schedule, sleep is good on her restoril, scarcely uses xanax which is good Needs to commit to regular exercise No weight loss , instead gain, understands the need to lose weight to remain on phentermine  ROS Denies recent fever or chills. Denies sinus pressure, nasal congestion, ear pain or sore throat. Denies chest congestion, productive cough or wheezing. Denies chest pains, palpitations and leg swelling Denies abdominal pain, nausea, vomiting,diarrhea or constipation.   Denies dysuria, frequency, hesitancy or incontinence. . Denies headaches, seizures, numbness, or tingling. Denies  Uncontrolled depression, anxiety or insomnia. Denies skin break down or rash.   PE  BP 118/78 mmHg  Pulse 78  Resp 16  Ht 5\' 6"  (1.676 m)  Wt 216 lb (97.977 kg)  BMI 34.88 kg/m2  SpO2 96%  Patient alert and oriented and in no cardiopulmonary distress.  HEENT: No facial asymmetry, EOMI,   oropharynx pink and moist.  Neck supple no JVD, no mass.  Chest: Clear to auscultation bilaterally.  CVS: S1, S2 no murmurs, no S3.Regular rate.  ABD: Soft non tender.   Ext: No edema  MS: Adequate though reduced  ROM lumbar  Spine, with spasm,normal ROM shoulders,  hips and knees.  Skin: Intact, no ulcerations or rash noted.  Psych: Good eye contact, normal affect. Memory intact not anxious or depressed appearing.  CNS: CN 2-12 intact, power,  normal throughout.no focal deficits noted.   Assessment & Plan   Backache Uncontrolled.Toradol and depo medrol administered IM in the office , to be followed by a short course of oral prednisone and NSAIDS. Robaxin prescribed for as needed use for spasm also  Generalized anxiety disorder Controlled, no change in medication Improved   Essential hypertension Controlled, no change in medication DASH diet and commitment to daily physical activity for a minimum of 30 minutes discussed and encouraged, as a part of hypertension management. The importance of attaining a healthy weight is also discussed.  BP/Weight 04/25/2015 12/20/2014 08/23/2014 04/12/2014 03/03/2014 12/16/2013 4/85/4627  Systolic BP 035 009 381 829 937 169 678  Diastolic BP 78 80 78 74 80 74 80  Wt. (Lbs) 216 211 207 206 206.12 203 201  BMI 34.88 34.07 33.43 32.26 32.77 34.03 31.96        Prediabetes Updated lab needed at/ before next visit. Patient educated about the importance of limiting  Carbohydrate intake , the need to commit to daily physical activity for a minimum of 30 minutes , and to commit weight loss. The fact that changes in all these areas will reduce or eliminate all together the development of diabetes is stressed.   Diabetic Labs Latest Ref Rng 08/31/2014 02/25/2014 02/19/2013 09/22/2012 09/22/2012  HbA1c <5.7 % 6.4(H)  6.6(H) 6.1(H) - 6.3(H)  Chol 0 - 200 mg/dL 156 153 - - 157  HDL >39 mg/dL 39(L) 35(L) - - 42  Calc LDL 0 - 99 mg/dL 107(H) 106(H) - - 104(H)  Triglycerides <150 mg/dL 49 60 - - 54  Creatinine 0.50 - 1.10 mg/dL 0.86 0.84 0.85 1.33(H) 0.72   BP/Weight 04/25/2015 12/20/2014 08/23/2014 04/12/2014 03/03/2014 12/16/2013 8/93/7342  Systolic BP 876 811 572 620 355 974 163  Diastolic BP 78 80 78 74 80 74 80  Wt. (Lbs)  216 211 207 206 206.12 203 201  BMI 34.88 34.07 33.43 32.26 32.77 34.03 31.96   Foot/eye exam completion dates 08/23/2014  Foot Form Completion Done       Obesity (BMI 30.0-34.9) Deteriorated. Patient re-educated about  the importance of commitment to a  minimum of 150 minutes of exercise per week.  The importance of healthy food choices with portion control discussed. Encouraged to start a food diary, count calories and to consider  joining a support group. Sample diet sheets offered. Goals set by the patient for the next several months.   Weight /BMI 04/25/2015 12/20/2014 08/23/2014  WEIGHT 216 lb 211 lb 207 lb  HEIGHT 5\' 6"  5\' 6"  5\' 6"   BMI 34.88 kg/m2 34.07 kg/m2 33.43 kg/m2    Current exercise per week 60 minutes.   Depression Controlled, no change in medication   Insomnia Sleep hygiene reviewed and written information offered also. Prescription sent for  medication needed.        Review of Systems     Objective:   Physical Exam        Assessment & Plan:

## 2015-04-25 NOTE — Patient Instructions (Signed)
F/u in 3 month, call if you need me before  Pls get labs this week as promised, fasting  Pls commit to 30 mins exercise daily and change food choice as far as snacks are concerned  Nine pound weight loss needed to continue phentermine in next 3 months as discussed, the benefit of a medication muist outweight the risk!  Injections and medications for back pain and spasm as discussed  Thanks for choosing [facility], we consider it a privelige to serve you.

## 2015-04-29 ENCOUNTER — Other Ambulatory Visit: Payer: Self-pay

## 2015-04-29 MED ORDER — HYDROCODONE-ACETAMINOPHEN 5-325 MG PO TABS: ORAL_TABLET | ORAL | Status: DC

## 2015-05-19 ENCOUNTER — Other Ambulatory Visit: Payer: Self-pay | Admitting: Family Medicine

## 2015-05-26 ENCOUNTER — Ambulatory Visit (INDEPENDENT_AMBULATORY_CARE_PROVIDER_SITE_OTHER): Payer: 59

## 2015-05-26 ENCOUNTER — Telehealth: Payer: Self-pay

## 2015-05-26 DIAGNOSIS — Z23 Encounter for immunization: Secondary | ICD-10-CM

## 2015-05-26 NOTE — Telephone Encounter (Signed)
Patient aware.

## 2015-05-26 NOTE — Telephone Encounter (Signed)
No c/o fever, advise and sentd in flonase spray 2 puffs twice daily, and also sudafed one daily for 3 days then as needed and saline flushes

## 2015-05-31 ENCOUNTER — Other Ambulatory Visit: Payer: Self-pay

## 2015-05-31 DIAGNOSIS — F32A Depression, unspecified: Secondary | ICD-10-CM

## 2015-05-31 DIAGNOSIS — F329 Major depressive disorder, single episode, unspecified: Secondary | ICD-10-CM

## 2015-05-31 MED ORDER — FLUOXETINE HCL 40 MG PO CAPS: 40.0000 mg | ORAL_CAPSULE | Freq: Every day | ORAL | Status: DC

## 2015-06-03 ENCOUNTER — Other Ambulatory Visit: Payer: Self-pay

## 2015-06-03 MED ORDER — HYDROCODONE-ACETAMINOPHEN 5-325 MG PO TABS: ORAL_TABLET | ORAL | Status: DC

## 2015-07-01 ENCOUNTER — Other Ambulatory Visit: Payer: Self-pay

## 2015-07-01 MED ORDER — HYDROCODONE-ACETAMINOPHEN 5-325 MG PO TABS: ORAL_TABLET | ORAL | Status: DC

## 2015-07-19 ENCOUNTER — Other Ambulatory Visit: Payer: Self-pay

## 2015-07-19 MED ORDER — TEMAZEPAM 30 MG PO CAPS: ORAL_CAPSULE | ORAL | Status: DC

## 2015-07-19 MED ORDER — METOPROLOL TARTRATE 25 MG PO TABS: ORAL_TABLET | ORAL | Status: DC

## 2015-08-01 MED FILL — TEMAZEPAM 30 MG CAPSULE: 30 | 90 days supply | Qty: 90 | Fill #0

## 2015-08-05 ENCOUNTER — Other Ambulatory Visit: Payer: Self-pay

## 2015-08-05 MED ORDER — HYDROCODONE-ACETAMINOPHEN 5-325 MG PO TABS: ORAL_TABLET | ORAL | Status: DC

## 2015-08-08 ENCOUNTER — Ambulatory Visit: Payer: 59 | Admitting: Family Medicine

## 2015-08-25 MED FILL — FLUoxetine HCL 40 MG CAPS: 40 | 90 days supply | Qty: 90 | Fill #1

## 2015-08-25 MED FILL — TRIAMTERENE-HCTZ 37.5-25 MG: 37.5-25 | 90 days supply | Qty: 90 | Fill #3

## 2015-08-29 ENCOUNTER — Encounter: Payer: Self-pay | Admitting: Family Medicine

## 2015-08-29 ENCOUNTER — Ambulatory Visit (INDEPENDENT_AMBULATORY_CARE_PROVIDER_SITE_OTHER): Payer: 59 | Admitting: Family Medicine

## 2015-08-29 VITALS — BP 128/70 | HR 86 | Resp 18 | Ht 66.5 in | Wt 205.0 lb

## 2015-08-29 DIAGNOSIS — I10 Essential (primary) hypertension: Secondary | ICD-10-CM | POA: Diagnosis not present

## 2015-08-29 DIAGNOSIS — E784 Other hyperlipidemia: Secondary | ICD-10-CM

## 2015-08-29 DIAGNOSIS — R7302 Impaired glucose tolerance (oral): Secondary | ICD-10-CM | POA: Diagnosis not present

## 2015-08-29 DIAGNOSIS — F329 Major depressive disorder, single episode, unspecified: Secondary | ICD-10-CM

## 2015-08-29 DIAGNOSIS — Z114 Encounter for screening for human immunodeficiency virus [HIV]: Secondary | ICD-10-CM

## 2015-08-29 DIAGNOSIS — E785 Hyperlipidemia, unspecified: Secondary | ICD-10-CM

## 2015-08-29 DIAGNOSIS — E119 Type 2 diabetes mellitus without complications: Secondary | ICD-10-CM

## 2015-08-29 DIAGNOSIS — F32A Depression, unspecified: Secondary | ICD-10-CM

## 2015-08-29 DIAGNOSIS — F411 Generalized anxiety disorder: Secondary | ICD-10-CM

## 2015-08-29 DIAGNOSIS — G4700 Insomnia, unspecified: Secondary | ICD-10-CM

## 2015-08-29 DIAGNOSIS — E669 Obesity, unspecified: Secondary | ICD-10-CM

## 2015-08-29 DIAGNOSIS — M5440 Lumbago with sciatica, unspecified side: Secondary | ICD-10-CM

## 2015-08-29 DIAGNOSIS — E8881 Metabolic syndrome: Secondary | ICD-10-CM

## 2015-08-29 DIAGNOSIS — R7303 Prediabetes: Secondary | ICD-10-CM

## 2015-08-29 NOTE — Progress Notes (Signed)
Subjective:    Patient ID: Mary Roman, female    DOB: 1971-02-25, 45 y.o.   MRN: [id number]  HPI   Mary Roman     MRN: [id number]      DOB: 02-27-1971   HPI Ms. Kamp is here for follow up and re-evaluation of chronic medical conditions, medication management and review of any available recent lab and radiology data.  Preventive health is updated, specifically  Cancer screening and Immunization.   Questions or concerns regarding consultations or procedures which the PT has had in the interim are  addressed. The PT denies any adverse reactions to current medications since the last visit.  There are no new concerns.  There are no specific complaints   ROS Denies recent fever or chills. Denies sinus pressure, nasal congestion, ear pain or sore throat. Denies chest congestion, productive cough or wheezing. Denies chest pains, palpitations and leg swelling Denies abdominal pain, nausea, vomiting,diarrhea or constipation.   Denies dysuria, frequency, hesitancy or incontinence. Denies joint pain, swelling and limitation in mobility. Denies headaches, seizures, numbness, or tingling. Denies depression, anxiety or insomnia. Denies skin break down or rash.   PE  BP 128/70 mmHg  Pulse 86  Resp 18  Ht 5' 6.5" (1.689 m)  Wt 205 lb (92.987 kg)  BMI 32.60 kg/m2  SpO2 96%  Patient alert and oriented and in no cardiopulmonary distress.  HEENT: No facial asymmetry, EOMI,   oropharynx pink and moist.  Neck supple no JVD, no mass.  Chest: Clear to auscultation bilaterally.  CVS: S1, S2 no murmurs, no S3.Regular rate.  ABD: Soft non tender.   Ext: No edema  MS: Adequate ROM spine, shoulders, hips and knees.  Skin: Intact, no ulcerations or rash noted.  Psych: Good eye contact, normal affect. Memory intact not anxious or depressed appearing.  CNS: CN 2-12 intact, power,  normal throughout.no focal deficits noted.   Assessment & Plan   Essential  hypertension Controlled, no change in medication DASH diet and commitment to daily physical activity for a minimum of 30 minutes discussed and encouraged, as a part of hypertension management. The importance of attaining a healthy weight is also discussed.  BP/Weight 08/29/2015 04/25/2015 12/20/2014 08/23/2014 04/12/2014 03/03/2014 [id number]  Systolic BP 0000000 123456 [id number]  Diastolic BP 70 78 80 78 74 80 74  Wt. (Lbs) 205 216 211 207 206 206.12 203  BMI 32.6 34.88 34.07 33.43 32.26 32.77 34.03        Depression Controlled, no change in medication   Generalized anxiety disorder Controlled, no change in medication   Obesity (BMI 30.0-34.9) Improved. Patient re-educated about  the importance of commitment to a  minimum of 150 minutes of exercise per week.  The importance of healthy food choices with portion control discussed. Encouraged to start a food diary, count calories and to consider  joining a support group. Sample diet sheets offered. Goals set by the patient for the next several months.   Weight /BMI 08/29/2015 04/25/2015 12/20/2014  WEIGHT 205 lb 216 lb 211 lb  HEIGHT 5' 6.5" 5\' 6"  5\' 6"   BMI 32.6 kg/m2 34.88 kg/m2 34.07 kg/m2    Current exercise per week 90 minutes.   Prediabetes Patient educated about the importance of limiting  Carbohydrate intake , the need to commit to daily physical activity for a minimum of 30 minutes , and to commit weight loss. The fact that changes in all these areas will reduce or eliminate all together  the development of diabetes is stressed.   Diabetic Labs Latest Ref Rng 08/31/2014 02/25/2014 02/19/2013 09/22/2012 09/22/2012  HbA1c <5.7 % 6.4(H) 6.6(H) 6.1(H) - 6.3(H)  Chol 0 - 200 mg/dL 156 153 - - 157  HDL >39 mg/dL 39(L) 35(L) - - 42  Calc LDL 0 - 99 mg/dL 107(H) 106(H) - - 104(H)  Triglycerides <150 mg/dL 49 60 - - 54  Creatinine 0.50 - 1.10 mg/dL 0.86 0.84 0.85 1.33(H) 0.72   BP/Weight 08/29/2015 04/25/2015 12/20/2014 08/23/2014  04/12/2014 03/03/2014 [id number]  Systolic BP 0000000 123456 [id number]  Diastolic BP 70 78 80 78 74 80 74  Wt. (Lbs) 205 216 211 207 206 206.12 203  BMI 32.6 34.88 34.07 33.43 32.26 32.77 34.03   Foot/eye exam completion dates 08/23/2014  Foot Form Completion Done  Updated lab needed at/ before next visit.      Insomnia Sleep hygiene reviewed and written information offered also. Prescription sent for  medication needed.   Metabolic syndrome X The increased risk of cardiovascular disease associated with this diagnosis, and the need to consistently work on lifestyle to change this is discussed. Following  a  heart healthy diet ,commitment to 30 minutes of exercise at least 5 days per week, as well as control of blood sugar and cholesterol , and achieving a healthy weight are all the areas to be addressed .   Dyslipidemia (high LDL; low HDL) Hyperlipidemia:Low fat diet discussed and encouraged.   Lipid Panel  Lab Results  Component Value Date   CHOL 156 08/31/2014   HDL 39* 08/31/2014   LDLCALC 107* 08/31/2014   TRIG 49 08/31/2014   CHOLHDL 4.0 08/31/2014   Updated lab needed at/ before next visit.      Backache Controlled, no change in medication        Review of Systems     Objective:   Physical Exam        Assessment & Plan:

## 2015-08-29 NOTE — Patient Instructions (Signed)
F/u in 4 month, call if you need me before  Fasting labs this week please  Congrats and good wishes  Please work on good  health habits so that your health will improve. 1. Commitment to daily physical activity for 30 to 60  minutes, if you are able to do this.  2. Commitment to wise food choices. Aim for half of your  food intake to be vegetable and fruit, one quarter starchy foods, and one quarter protein. Try to eat on a regular schedule  3 meals per day, snacking between meals should be limited to vegetables or fruits or small portions of nuts. 64 ounces of water per day is generally recommended, unless you have specific health conditions, like heart failure or kidney failure where you will need to limit fluid intake.  3. Commitment to sufficient and a  good quality of physical and mental rest daily, generally between 6 to 8 hours per day.  WITH PERSISTANCE AND PERSEVERANCE, THE IMPOSSIBLE , BECOMES THE NORM!  Weight loss goal of 5 pounds per month

## 2015-09-01 ENCOUNTER — Other Ambulatory Visit: Payer: Self-pay

## 2015-09-01 MED ORDER — HYDROCODONE-ACETAMINOPHEN 5-325 MG PO TABS: ORAL_TABLET | ORAL | Status: DC

## 2015-09-03 NOTE — Assessment & Plan Note (Signed)
Sleep hygiene reviewed and written information offered also. Prescription sent for  medication needed.  

## 2015-09-03 NOTE — Assessment & Plan Note (Signed)
Controlled, no change in medication DASH diet and commitment to daily physical activity for a minimum of 30 minutes discussed and encouraged, as a part of hypertension management. The importance of attaining a healthy weight is also discussed.  BP/Weight 08/29/2015 04/25/2015 12/20/2014 08/23/2014 04/12/2014 03/03/2014 [id number]  Systolic BP 0000000 123456 [id number]  Diastolic BP 70 78 80 78 74 80 74  Wt. (Lbs) 205 216 211 207 206 206.12 203  BMI 32.6 34.88 34.07 33.43 32.26 32.77 34.03

## 2015-09-03 NOTE — Assessment & Plan Note (Signed)
Controlled, no change in medication  

## 2015-09-03 NOTE — Assessment & Plan Note (Signed)
Improved. Patient re-educated about  the importance of commitment to a  minimum of 150 minutes of exercise per week.  The importance of healthy food choices with portion control discussed. Encouraged to start a food diary, count calories and to consider  joining a support group. Sample diet sheets offered. Goals set by the patient for the next several months.   Weight /BMI 08/29/2015 04/25/2015 12/20/2014  WEIGHT 205 lb 216 lb 211 lb  HEIGHT 5' 6.5" 5\' 6"  5\' 6"   BMI 32.6 kg/m2 34.88 kg/m2 34.07 kg/m2    Current exercise per week 90 minutes.

## 2015-09-03 NOTE — Assessment & Plan Note (Signed)
The increased risk of cardiovascular disease associated with this diagnosis, and the need to consistently work on lifestyle to change this is discussed. Following  a  heart healthy diet ,commitment to 30 minutes of exercise at least 5 days per week, as well as control of blood sugar and cholesterol , and achieving a healthy weight are all the areas to be addressed .  

## 2015-09-03 NOTE — Assessment & Plan Note (Signed)
Patient educated about the importance of limiting  Carbohydrate intake , the need to commit to daily physical activity for a minimum of 30 minutes , and to commit weight loss. The fact that changes in all these areas will reduce or eliminate all together the development of diabetes is stressed.   Diabetic Labs Latest Ref Rng 08/31/2014 02/25/2014 02/19/2013 09/22/2012 09/22/2012  HbA1c <5.7 % 6.4(H) 6.6(H) 6.1(H) - 6.3(H)  Chol 0 - 200 mg/dL 156 153 - - 157  HDL >39 mg/dL 39(L) 35(L) - - 42  Calc LDL 0 - 99 mg/dL 107(H) 106(H) - - 104(H)  Triglycerides <150 mg/dL 49 60 - - 54  Creatinine 0.50 - 1.10 mg/dL 0.86 0.84 0.85 1.33(H) 0.72   BP/Weight 08/29/2015 04/25/2015 12/20/2014 08/23/2014 04/12/2014 03/03/2014 [id number]  Systolic BP 0000000 123456 [id number]  Diastolic BP 70 78 80 78 74 80 74  Wt. (Lbs) 205 216 211 207 206 206.12 203  BMI 32.6 34.88 34.07 33.43 32.26 32.77 34.03   Foot/eye exam completion dates 08/23/2014  Foot Form Completion Done  Updated lab needed at/ before next visit.

## 2015-09-03 NOTE — Assessment & Plan Note (Signed)
Hyperlipidemia:Low fat diet discussed and encouraged.   Lipid Panel  Lab Results  Component Value Date   CHOL 156 08/31/2014   HDL 39* 08/31/2014   LDLCALC 107* 08/31/2014   TRIG 49 08/31/2014   CHOLHDL 4.0 08/31/2014   Updated lab needed at/ before next visit.

## 2015-09-16 DIAGNOSIS — E785 Hyperlipidemia, unspecified: Secondary | ICD-10-CM | POA: Diagnosis not present

## 2015-09-16 DIAGNOSIS — R7302 Impaired glucose tolerance (oral): Secondary | ICD-10-CM | POA: Diagnosis not present

## 2015-09-16 DIAGNOSIS — Z114 Encounter for screening for human immunodeficiency virus [HIV]: Secondary | ICD-10-CM | POA: Diagnosis not present

## 2015-09-16 DIAGNOSIS — E784 Other hyperlipidemia: Secondary | ICD-10-CM | POA: Diagnosis not present

## 2015-09-16 DIAGNOSIS — I10 Essential (primary) hypertension: Secondary | ICD-10-CM | POA: Diagnosis not present

## 2015-09-16 DIAGNOSIS — Z1159 Encounter for screening for other viral diseases: Secondary | ICD-10-CM | POA: Diagnosis not present

## 2015-09-16 LAB — HEMOGLOBIN A1C
Hgb A1c MFr Bld: 6.7 % — ABNORMAL HIGH (ref <5.7)
Mean Plasma Glucose: 146 mg/dL — ABNORMAL HIGH (ref <117)

## 2015-09-17 LAB — COMPREHENSIVE METABOLIC PANEL
ALK PHOS: 63 U/L (ref 33–115)
ALT: 11 U/L (ref 6–29)
AST: 12 U/L (ref 10–35)
Albumin: 3.7 g/dL (ref 3.6–5.1)
BUN: 11 mg/dL (ref 7–25)
CALCIUM: 9.1 mg/dL (ref 8.6–10.2)
CO2: 28 mmol/L (ref 20–31)
Chloride: 101 mmol/L (ref 98–110)
Creat: 0.9 mg/dL (ref 0.50–1.10)
Glucose, Bld: 112 mg/dL — ABNORMAL HIGH (ref 65–99)
POTASSIUM: 3.7 mmol/L (ref 3.5–5.3)
Sodium: 140 mmol/L (ref 135–146)
TOTAL PROTEIN: 6.6 g/dL (ref 6.1–8.1)
Total Bilirubin: 0.5 mg/dL (ref 0.2–1.2)

## 2015-09-17 LAB — LIPID PANEL
CHOL/HDL RATIO: 4.2 ratio (ref <=5.0)
CHOLESTEROL: 155 mg/dL (ref 125–200)
HDL: 37 mg/dL — AB (ref >=46)
LDL Cholesterol: 104 mg/dL (ref <130)
Triglycerides: 69 mg/dL (ref <150)
VLDL: 14 mg/dL (ref <30)

## 2015-09-17 LAB — HIV ANTIBODY (ROUTINE TESTING W REFLEX): HIV: NONREACTIVE

## 2015-09-23 MED ORDER — METFORMIN HCL ER 500 MG PO TB24: 500.0000 mg | ORAL_TABLET | Freq: Every day | ORAL | Status: DC

## 2015-09-23 MED FILL — METFORMIN HCL ER 500 MG TAB: 500 | 90 days supply | Qty: 90 | Fill #0

## 2015-09-23 NOTE — Addendum Note (Signed)
Addended by: Denman George B on: 09/23/2015 04:23 PM   Modules accepted: Orders

## 2015-09-30 ENCOUNTER — Other Ambulatory Visit: Payer: Self-pay

## 2015-09-30 MED ORDER — HYDROCODONE-ACETAMINOPHEN 5-325 MG PO TABS: ORAL_TABLET | ORAL | Status: DC

## 2015-10-03 DIAGNOSIS — H524 Presbyopia: Secondary | ICD-10-CM | POA: Diagnosis not present

## 2015-10-03 LAB — HM DIABETES EYE EXAM

## 2015-10-28 ENCOUNTER — Other Ambulatory Visit: Payer: Self-pay

## 2015-10-28 MED ORDER — HYDROCODONE-ACETAMINOPHEN 5-325 MG PO TABS: ORAL_TABLET | ORAL | Status: DC

## 2015-11-04 MED FILL — busPIRone HCL 7.5 MG TABS: 7.5 | 90 days supply | Qty: 270 | Fill #1

## 2015-11-04 MED FILL — METOPROLOL TARTRATE 25 MG T: 25 | 90 days supply | Qty: 90 | Fill #1

## 2015-11-10 ENCOUNTER — Telehealth: Payer: Self-pay

## 2015-11-10 MED ORDER — LORATADINE 10 MG PO TABS: 10.0000 mg | ORAL_TABLET | Freq: Every day | ORAL | Status: DC

## 2015-11-10 MED FILL — CLEAR-ATADINE 10 MG TABLET: 10 | 90 days supply | Qty: 90 | Fill #0

## 2015-11-10 NOTE — Telephone Encounter (Signed)
Wants some claritin called in to [facility]

## 2015-11-15 ENCOUNTER — Other Ambulatory Visit: Payer: Self-pay

## 2015-11-15 MED ORDER — HYDROCODONE-ACETAMINOPHEN 5-325 MG PO TABS
ORAL_TABLET | ORAL | Status: DC
Start: 2015-11-15 — End: 2015-12-09

## 2015-11-18 MED FILL — TEMAZEPAM 30 MG CAPSULE: 30 | 90 days supply | Qty: 90 | Fill #1

## 2015-12-09 ENCOUNTER — Other Ambulatory Visit: Payer: Self-pay

## 2015-12-09 MED ORDER — HYDROCODONE-ACETAMINOPHEN 5-325 MG PO TABS: ORAL_TABLET | ORAL | Status: DC

## 2015-12-12 ENCOUNTER — Other Ambulatory Visit: Payer: Self-pay

## 2015-12-12 DIAGNOSIS — F411 Generalized anxiety disorder: Secondary | ICD-10-CM

## 2015-12-12 MED ORDER — TRIAMTERENE-HCTZ 37.5-25 MG PO TABS: 1.0000 | ORAL_TABLET | Freq: Every day | ORAL | Status: DC

## 2015-12-12 MED ORDER — ALPRAZOLAM 0.25 MG PO TABS: 0.2500 mg | ORAL_TABLET | Freq: Every day | ORAL | Status: DC

## 2015-12-12 MED FILL — TRIAMTERENE/HCTZ 37.5/25 TB: 37.5-25 | 90 days supply | Qty: 90 | Fill #0

## 2015-12-12 MED FILL — ALPRAZolam 0.25 MG TABS: 0.25 | 90 days supply | Qty: 90 | Fill #0

## 2016-01-03 MED FILL — FLUoxetine HCL 40 MG CAPS: 40 | 90 days supply | Qty: 90 | Fill #2

## 2016-01-03 MED FILL — METFORMIN HCL ER 500 MG TAB: 500 | 90 days supply | Qty: 90 | Fill #1

## 2016-01-16 ENCOUNTER — Ambulatory Visit: Payer: 59 | Admitting: Family Medicine

## 2016-01-25 ENCOUNTER — Other Ambulatory Visit: Payer: Self-pay

## 2016-01-25 MED ORDER — HYDROCODONE-ACETAMINOPHEN 5-325 MG PO TABS: ORAL_TABLET | ORAL | Status: DC

## 2016-02-01 DIAGNOSIS — E119 Type 2 diabetes mellitus without complications: Secondary | ICD-10-CM | POA: Diagnosis not present

## 2016-02-02 ENCOUNTER — Encounter: Payer: Self-pay | Admitting: Family Medicine

## 2016-02-02 ENCOUNTER — Other Ambulatory Visit (HOSPITAL_COMMUNITY)
Admission: RE | Admit: 2016-02-02 | Discharge: 2016-02-02 | Disposition: A | Discharge status: Home / Self Care | Payer: 59 | Source: Other Acute Inpatient Hospital | Attending: Family Medicine | Admitting: Family Medicine

## 2016-02-02 ENCOUNTER — Ambulatory Visit (INDEPENDENT_AMBULATORY_CARE_PROVIDER_SITE_OTHER): Payer: 59 | Admitting: Family Medicine

## 2016-02-02 VITALS — BP 120/80 | HR 74 | Resp 18 | Ht 66.5 in | Wt 214.0 lb

## 2016-02-02 DIAGNOSIS — E119 Type 2 diabetes mellitus without complications: Secondary | ICD-10-CM

## 2016-02-02 DIAGNOSIS — I10 Essential (primary) hypertension: Secondary | ICD-10-CM

## 2016-02-02 DIAGNOSIS — M5440 Lumbago with sciatica, unspecified side: Secondary | ICD-10-CM

## 2016-02-02 DIAGNOSIS — G478 Other sleep disorders: Secondary | ICD-10-CM

## 2016-02-02 DIAGNOSIS — R7303 Prediabetes: Secondary | ICD-10-CM | POA: Diagnosis not present

## 2016-02-02 DIAGNOSIS — F329 Major depressive disorder, single episode, unspecified: Secondary | ICD-10-CM

## 2016-02-02 DIAGNOSIS — F32A Depression, unspecified: Secondary | ICD-10-CM

## 2016-02-02 DIAGNOSIS — F411 Generalized anxiety disorder: Secondary | ICD-10-CM

## 2016-02-02 DIAGNOSIS — E66811 Obesity, class 1: Secondary | ICD-10-CM

## 2016-02-02 DIAGNOSIS — G4730 Sleep apnea, unspecified: Secondary | ICD-10-CM

## 2016-02-02 DIAGNOSIS — G4700 Insomnia, unspecified: Secondary | ICD-10-CM

## 2016-02-02 DIAGNOSIS — E669 Obesity, unspecified: Secondary | ICD-10-CM

## 2016-02-02 DIAGNOSIS — E8881 Metabolic syndrome: Secondary | ICD-10-CM

## 2016-02-02 DIAGNOSIS — E1169 Type 2 diabetes mellitus with other specified complication: Secondary | ICD-10-CM

## 2016-02-02 LAB — COMPLETE METABOLIC PANEL WITH GFR
ALT: 11 U/L (ref 6–29)
AST: 13 U/L (ref 10–35)
Albumin: 3.8 g/dL (ref 3.6–5.1)
Alkaline Phosphatase: 70 U/L (ref 33–115)
BILIRUBIN TOTAL: 0.6 mg/dL (ref 0.2–1.2)
BUN: 13 mg/dL (ref 7–25)
CO2: 26 mmol/L (ref 20–31)
Calcium: 9.2 mg/dL (ref 8.6–10.2)
Chloride: 98 mmol/L (ref 98–110)
Creat: 0.87 mg/dL (ref 0.50–1.10)
GFR, EST NON AFRICAN AMERICAN: 80 mL/min (ref >=60)
GFR, Est African American: >89 mL/min (ref >=60)
GLUCOSE: 128 mg/dL — AB (ref 65–99)
Potassium: 3.8 mmol/L (ref 3.5–5.3)
SODIUM: 139 mmol/L (ref 135–146)
TOTAL PROTEIN: 6.9 g/dL (ref 6.1–8.1)

## 2016-02-02 LAB — URINALYSIS, ROUTINE W REFLEX MICROSCOPIC
Bilirubin Urine: NEGATIVE
Glucose, UA: NEGATIVE mg/dL
KETONES UR: NEGATIVE mg/dL
Leukocytes, UA: NEGATIVE
NITRITE: NEGATIVE
PH: 6 (ref 5.0–8.0)
Protein, ur: NEGATIVE mg/dL
Specific Gravity, Urine: 1.015 (ref 1.005–1.030)

## 2016-02-02 LAB — URINE MICROSCOPIC-ADD ON

## 2016-02-02 LAB — HEMOGLOBIN A1C
Hgb A1c MFr Bld: 6.4 % — ABNORMAL HIGH (ref <5.7)
MEAN PLASMA GLUCOSE: 137 mg/dL

## 2016-02-02 MED ORDER — TEMAZEPAM 30 MG PO CAPS: ORAL_CAPSULE | ORAL | Status: DC

## 2016-02-02 MED ORDER — METFORMIN HCL ER (OSM) 1000 MG PO TB24: 1000.0000 mg | ORAL_TABLET | Freq: Every day | ORAL | Status: DC

## 2016-02-02 NOTE — Assessment & Plan Note (Signed)
Sleep hygiene reviewed and written information offered also. Prescription sent for  medication needed.  

## 2016-02-02 NOTE — Assessment & Plan Note (Signed)
Chronic fatigue, headache and snoring needs eval, referred

## 2016-02-02 NOTE — Patient Instructions (Signed)
F/u in 4 month, call if you need me before  INCREASE metformin to 1000 mg once daily, OK to take TWO 500mg  tablets together daily , till done.  Microalb and foot exam today  CONGRATS on improved blood sugar  You are referred to Dr Merlene Laughter for evaluation of snoring   It is important that you exercise regularly at least 30 minutes 5 times a week. If you develop chest pain, have severe difficulty breathing, or feel very tired, stop exercising immediately and seek medical attention    Please work on good  health habits so that your health will improve. 1. Commitment to daily physical activity for 30 to 60  minutes, if you are able to do this.  2. Commitment to wise food choices. Aim for half of your  food intake to be vegetable and fruit, one quarter starchy foods, and one quarter protein. Try to eat on a regular schedule  3 meals per day, snacking between meals should be limited to vegetables or fruits or small portions of nuts. 64 ounces of water per day is generally recommended, unless you have specific health conditions, like heart failure or kidney failure where you will need to limit fluid intake.  3. Commitment to sufficient and a  good quality of physical and mental rest daily, generally between 6 to 8 hours per day.  WITH PERSISTANCE AND PERSEVERANCE, THE IMPOSSIBLE , BECOMES THE NORM!\

## 2016-02-02 NOTE — Assessment & Plan Note (Signed)
The increased risk of cardiovascular disease associated with this diagnosis, and the need to consistently work on lifestyle to change this is discussed. Following  a  heart healthy diet ,commitment to 30 minutes of exercise at least 5 days per week, as well as control of blood sugar and cholesterol , and achieving a healthy weight are all the areas to be addressed .  

## 2016-02-02 NOTE — Assessment & Plan Note (Signed)
Controlled, no change in medication  

## 2016-02-02 NOTE — Assessment & Plan Note (Signed)
Controlled, no change in medication DASH diet and commitment to daily physical activity for a minimum of 30 minutes discussed and encouraged, as a part of hypertension management. The importance of attaining a healthy weight is also discussed.  BP/Weight 02/02/2016 08/29/2015 04/25/2015 12/20/2014 08/23/2014 [id number]  Systolic BP 123456 0000000 123456 [id number]  Diastolic BP 80 70 78 80 78 74 80  Wt. (Lbs) 214 205 216 211 207 206 206.12  BMI 34.03 32.6 34.88 34.07 33.43 32.26 32.77

## 2016-02-02 NOTE — Assessment & Plan Note (Signed)
Increased anxiety with increased work stress, no med change, resume exercise and consider therapy

## 2016-02-02 NOTE — Assessment & Plan Note (Signed)
Controlled, no change in medication May benefit from short term therapy due to increased work stress

## 2016-02-02 NOTE — Assessment & Plan Note (Signed)
Improved Mary Roman is reminded of the importance of commitment to daily physical activity for 30 minutes or more, as able and the need to limit carbohydrate intake to 30 to 60 grams per meal to help with blood sugar control.   The need to take medication as prescribed, test blood sugar as directed, and to call between visits if there is a concern that blood sugar is uncontrolled is also discussed.   Mary Roman is reminded of the importance of daily foot exam, annual eye examination, and good blood sugar, blood pressure and cholesterol control.  Diabetic Labs Latest Ref Rng 02/01/2016 09/16/2015 08/31/2014 02/25/2014 02/19/2013  HbA1c <5.7 % 6.4(H) 6.7(H) 6.4(H) 6.6(H) 6.1(H)  Chol 125 - 200 mg/dL - 155 156 153 -  HDL >=46 mg/dL - 37(L) 39(L) 35(L) -  Calc LDL <130 mg/dL - 104 107(H) 106(H) -  Triglycerides <150 mg/dL - 69 49 60 -  Creatinine 0.50 - 1.10 mg/dL 0.87 0.90 0.86 0.84 0.85   BP/Weight 02/02/2016 08/29/2015 04/25/2015 12/20/2014 08/23/2014 [id number]  Systolic BP 123456 0000000 123456 [id number]  Diastolic BP 80 70 78 80 78 74 80  Wt. (Lbs) 214 205 216 211 207 206 206.12  BMI 34.03 32.6 34.88 34.07 33.43 32.26 32.77   Foot/eye exam completion dates Latest Ref Rng 10/03/2015 08/23/2014  Eye Exam No Retinopathy No Retinopathy -  Foot Form Completion - - Done

## 2016-02-02 NOTE — Assessment & Plan Note (Signed)
Deteriorated. Patient re-educated about  the importance of commitment to a  minimum of 150 minutes of exercise per week.  The importance of healthy food choices with portion control discussed. Encouraged to start a food diary, count calories and to consider  joining a support group. Sample diet sheets offered. Goals set by the patient for the next several months.   Weight /BMI 02/02/2016 08/29/2015 04/25/2015  WEIGHT 214 lb 205 lb 216 lb  HEIGHT 5' 6.5" 5' 6.5" 5\' 6"   BMI 34.03 kg/m2 32.6 kg/m2 34.88 kg/m2

## 2016-02-02 NOTE — Progress Notes (Signed)
Mary Roman     MRN: [id number]      DOB: 08-05-1970   HPI Mary Roman is here for follow up and re-evaluation of chronic medical conditions, medication management and review of any available recent lab and radiology data.  Preventive health is updated, specifically  Cancer screening and Immunization.   Questions or concerns regarding consultations or procedures which the PT has had in the interim are  addressed. The PT denies any adverse reactions to current medications since the last visit.  Has recently been under increased work stress with weight gain and she still has not started exercising , but she intends to do so, has also been drinking an excessive amount of sodas, but realizes that metformin makes this a bad habit. Also knows to get help through employee assistance counselling  C/o chronic fatigue and excess snoring, has been told by ENT that her tonsils and adenoids are enlarged, also c/o headaches, needs sleep study, likely has sleep apnea  ROS Denies recent fever or chills. Denies sinus pressure, nasal congestion, ear pain or sore throat. Denies chest congestion, productive cough or wheezing. Denies chest pains, palpitations and leg swelling Denies abdominal pain, nausea, vomiting,diarrhea or constipation.   Denies dysuria, frequency, hesitancy or incontinence. Denies uncontrolled  joint pain, swelling and limitation in mobility. Denies headaches, seizures, numbness, or tingling. Denies depression, uncontrolled anxiety or insomnia. Denies skin break down or rash.   PE  BP 120/80 mmHg  Pulse 74  Resp 18  Ht 5' 6.5" (1.689 m)  Wt 214 lb (97.07 kg)  BMI 34.03 kg/m2  SpO2 99%  Patient alert and oriented and in no cardiopulmonary distress.  HEENT: No facial asymmetry, EOMI,   oropharynx pink and moist.  Neck supple no JVD, no mass.  Chest: Clear to auscultation bilaterally.  CVS: S1, S2 no murmurs, no S3.Regular rate.  ABD: Soft non tender.   Ext: No  edema  MS: Adequate ROM spine, shoulders, hips and knees.  Skin: Intact, no ulcerations or rash noted.  Psych: Good eye contact, normal affect. Memory intact not anxious or depressed appearing.  CNS: CN 2-12 intact, power,  normal throughout.no focal deficits noted.   Assessment & Plan  Sleep-disordered breathing Chronic fatigue, headache and snoring needs eval, referred  Diabetes mellitus type 2 in obese [facility]) Improved Mary Roman is reminded of the importance of commitment to daily physical activity for 30 minutes or more, as able and the need to limit carbohydrate intake to 30 to 60 grams per meal to help with blood sugar control.   The need to take medication as prescribed, test blood sugar as directed, and to call between visits if there is a concern that blood sugar is uncontrolled is also discussed.   Mary Roman is reminded of the importance of daily foot exam, annual eye examination, and good blood sugar, blood pressure and cholesterol control.  Diabetic Labs Latest Ref Rng 02/01/2016 09/16/2015 08/31/2014 02/25/2014 02/19/2013  HbA1c <5.7 % 6.4(H) 6.7(H) 6.4(H) 6.6(H) 6.1(H)  Chol 125 - 200 mg/dL - 155 156 153 -  HDL >=46 mg/dL - 37(L) 39(L) 35(L) -  Calc LDL <130 mg/dL - 104 107(H) 106(H) -  Triglycerides <150 mg/dL - 69 49 60 -  Creatinine 0.50 - 1.10 mg/dL 0.87 0.90 0.86 0.84 0.85   BP/Weight 02/02/2016 08/29/2015 04/25/2015 12/20/2014 08/23/2014 [id number]  Systolic BP 123456 0000000 123456 [id number]  Diastolic BP 80 70 78 80 78 74 80  Wt. (Lbs) 214  205 216 211 207 206 206.12  BMI 34.03 32.6 34.88 34.07 33.43 32.26 32.77   Foot/eye exam completion dates Latest Ref Rng 10/03/2015 08/23/2014  Eye Exam No Retinopathy No Retinopathy -  Foot Form Completion - - Done         Depression Controlled, no change in medication May benefit from short term therapy due to increased work stress  Essential hypertension Controlled, no change in medication DASH diet and  commitment to daily physical activity for a minimum of 30 minutes discussed and encouraged, as a part of hypertension management. The importance of attaining a healthy weight is also discussed.  BP/Weight 02/02/2016 08/29/2015 04/25/2015 12/20/2014 08/23/2014 [id number]  Systolic BP 123456 0000000 123456 [id number]  Diastolic BP 80 70 78 80 78 74 80  Wt. (Lbs) 214 205 216 211 207 206 206.12  BMI 34.03 32.6 34.88 34.07 33.43 32.26 32.77        Obesity (BMI 30.0-34.9) Deteriorated. Patient re-educated about  the importance of commitment to a  minimum of 150 minutes of exercise per week.  The importance of healthy food choices with portion control discussed. Encouraged to start a food diary, count calories and to consider  joining a support group. Sample diet sheets offered. Goals set by the patient for the next several months.   Weight /BMI 02/02/2016 08/29/2015 04/25/2015  WEIGHT 214 lb 205 lb 216 lb  HEIGHT 5' 6.5" 5' 6.5" 5\' 6"   BMI 34.03 kg/m2 32.6 kg/m2 34.88 kg/m2       Backache Controlled, no change in medication   Generalized anxiety disorder Increased anxiety with increased work stress, no med change, resume exercise and consider therapy  Insomnia Sleep hygiene reviewed and written information offered also. Prescription sent for  medication needed.   Metabolic syndrome X The increased risk of cardiovascular disease associated with this diagnosis, and the need to consistently work on lifestyle to change this is discussed. Following  a  heart healthy diet ,commitment to 30 minutes of exercise at least 5 days per week, as well as control of blood sugar and cholesterol , and achieving a healthy weight are all the areas to be addressed .

## 2016-02-09 ENCOUNTER — Other Ambulatory Visit: Payer: Self-pay

## 2016-02-09 MED ORDER — HYDROCODONE-ACETAMINOPHEN 5-325 MG PO TABS: ORAL_TABLET | ORAL | Status: DC

## 2016-02-14 MED FILL — TEMAZEPAM 30 MG CAPSULE: 30 | 90 days supply | Qty: 90 | Fill #0

## 2016-03-16 ENCOUNTER — Other Ambulatory Visit: Payer: Self-pay

## 2016-03-16 MED ORDER — HYDROCODONE-ACETAMINOPHEN 5-325 MG PO TABS: ORAL_TABLET | ORAL | 0 refills | Status: DC

## 2016-03-19 MED FILL — TRIAMTERENE/HCTZ 37.5/25 TB: 37.5-25 | 90 days supply | Qty: 90 | Fill #1

## 2016-03-21 ENCOUNTER — Other Ambulatory Visit: Payer: Self-pay

## 2016-03-21 ENCOUNTER — Other Ambulatory Visit: Payer: Self-pay | Admitting: Family Medicine

## 2016-03-21 DIAGNOSIS — Z1231 Encounter for screening mammogram for malignant neoplasm of breast: Secondary | ICD-10-CM

## 2016-03-21 MED ORDER — METOPROLOL TARTRATE 25 MG PO TABS: ORAL_TABLET | ORAL | 1 refills | Status: DC

## 2016-03-21 MED FILL — METOPROLOL TARTRATE 25 MG T: 25 | 90 days supply | Qty: 90 | Fill #0

## 2016-04-09 ENCOUNTER — Ambulatory Visit (HOSPITAL_COMMUNITY)
Admission: RE | Admit: 2016-04-09 | Discharge: 2016-04-09 | Disposition: A | Discharge status: Home / Self Care | Payer: 59 | Source: Ambulatory Visit | Attending: Family Medicine | Admitting: Family Medicine

## 2016-04-09 DIAGNOSIS — Z1231 Encounter for screening mammogram for malignant neoplasm of breast: Secondary | ICD-10-CM | POA: Insufficient documentation

## 2016-04-09 DIAGNOSIS — R928 Other abnormal and inconclusive findings on diagnostic imaging of breast: Secondary | ICD-10-CM | POA: Insufficient documentation

## 2016-04-09 MED FILL — busPIRone HCL 7.5 MG TABS: 7.5 | 90 days supply | Qty: 270 | Fill #2

## 2016-04-09 MED FILL — FLUoxetine HCL 40 MG CAPS: 40 | 90 days supply | Qty: 90 | Fill #3

## 2016-04-10 ENCOUNTER — Other Ambulatory Visit: Payer: Self-pay | Admitting: Family Medicine

## 2016-04-10 DIAGNOSIS — R928 Other abnormal and inconclusive findings on diagnostic imaging of breast: Secondary | ICD-10-CM

## 2016-04-11 ENCOUNTER — Other Ambulatory Visit: Payer: 59 | Admitting: Advanced Practice Midwife

## 2016-04-13 ENCOUNTER — Other Ambulatory Visit: Payer: Self-pay

## 2016-04-13 MED ORDER — HYDROCODONE-ACETAMINOPHEN 5-325 MG PO TABS: ORAL_TABLET | ORAL | 0 refills | Status: DC

## 2016-04-17 ENCOUNTER — Ambulatory Visit (HOSPITAL_COMMUNITY)
Admission: RE | Admit: 2016-04-17 | Discharge: 2016-04-17 | Disposition: A | Discharge status: Home / Self Care | Payer: 59 | Source: Ambulatory Visit | Attending: Family Medicine | Admitting: Family Medicine

## 2016-04-17 DIAGNOSIS — R928 Other abnormal and inconclusive findings on diagnostic imaging of breast: Secondary | ICD-10-CM

## 2016-04-17 DIAGNOSIS — N6489 Other specified disorders of breast: Secondary | ICD-10-CM | POA: Diagnosis not present

## 2016-04-18 MED FILL — METFORMIN HCL ER 500 MG TAB: 500 | 90 days supply | Qty: 180 | Fill #0

## 2016-04-26 ENCOUNTER — Other Ambulatory Visit: Payer: 59 | Admitting: Advanced Practice Midwife

## 2016-05-09 ENCOUNTER — Encounter: Payer: Self-pay | Admitting: Advanced Practice Midwife

## 2016-05-09 ENCOUNTER — Ambulatory Visit (INDEPENDENT_AMBULATORY_CARE_PROVIDER_SITE_OTHER): Payer: 59 | Admitting: Advanced Practice Midwife

## 2016-05-09 ENCOUNTER — Other Ambulatory Visit (HOSPITAL_COMMUNITY)
Admission: RE | Admit: 2016-05-09 | Discharge: 2016-05-09 | Disposition: A | Discharge status: Home / Self Care | Payer: 59 | Source: Ambulatory Visit | Attending: Advanced Practice Midwife | Admitting: Advanced Practice Midwife

## 2016-05-09 VITALS — BP 118/88 | HR 60 | Ht 65.0 in | Wt 215.0 lb

## 2016-05-09 DIAGNOSIS — Z1151 Encounter for screening for human papillomavirus (HPV): Secondary | ICD-10-CM | POA: Insufficient documentation

## 2016-05-09 DIAGNOSIS — Z01419 Encounter for gynecological examination (general) (routine) without abnormal findings: Secondary | ICD-10-CM | POA: Diagnosis not present

## 2016-05-09 NOTE — Progress Notes (Signed)
Mary Roman 45 y.o.  Vitals:   05/09/16 0841  BP: 118/88  Pulse: 60     Filed Weights   05/09/16 0841  Weight: 215 lb (97.5 kg)    Past Medical History: Past Medical History:  Diagnosis Date  . Anxiety   . Chest tightness, discomfort, or pressure 05/2010   Emergency Department   . Chronic back pain    with disc disease   . Headache(784.0)   . Hypertension   . Obesity   . Palpitation   . Thyromegaly    TSH-0.8 and 07/2010    Past Surgical History: Past Surgical History:  Procedure Laterality Date  . None      Family History: Family History  Problem Relation Age of Onset  . Diabetes Mother   . Hypertension Mother   . Stroke Mother   . Colon cancer Mother   . Pneumonia Father   . Hypertension Sister   . Hypertension Sister   . Leukemia Sister   . Diabetes Maternal Grandmother   . Hypertension Maternal Grandmother   . Hypertension Maternal Grandfather   . Heart attack Maternal Uncle     Social History: Social History  Substance Use Topics  . Smoking status: Never Smoker  . Smokeless tobacco: Never Used  . Alcohol use No    Allergies: No Known Allergies    Current Outpatient Prescriptions:  .  ALPRAZolam (XANAX) 0.25 MG tablet, Take 1 tablet (0.25 mg total) by mouth at bedtime., Disp: 90 tablet, Rfl: 0 .  busPIRone (BUSPAR) 7.5 MG tablet, TAKE 1 TABLET BY MOUTH 3 TIMES DAILY., Disp: 270 tablet, Rfl: PRN .  FLUoxetine (PROZAC) 40 MG capsule, Take 1 capsule (40 mg total) by mouth daily., Disp: 90 capsule, Rfl: 3 .  HYDROcodone-acetaminophen (NORCO/VICODIN) 5-325 MG tablet, TAKE ONE TABLET BY MOUTH ONCE DAILY, Disp: 30 tablet, Rfl: 0 .  hydrOXYzine (ATARAX/VISTARIL) 10 MG tablet, Take 1 tablet (10 mg total) by mouth at bedtime as needed., Disp: 30 tablet, Rfl: 1 .  loratadine (CLARITIN) 10 MG tablet, Take 1 tablet (10 mg total) by mouth daily., Disp: 90 tablet, Rfl: 1 .  metformin (FORTAMET) 1000 MG (OSM) 24 hr tablet, Take 1 tablet (1,000 mg  total) by mouth daily with breakfast., Disp: 90 tablet, Rfl: 3 .  methocarbamol (ROBAXIN) 500 MG tablet, Take 1 tablet (500 mg total) by mouth 3 (three) times daily. As needed, Disp: 90 tablet, Rfl: 0 .  metoprolol tartrate (LOPRESSOR) 25 MG tablet, TAKE 1 TABLET BY MOUTH  DAILY., Disp: 90 tablet, Rfl: 1 .  temazepam (RESTORIL) 30 MG capsule, TAKE 1 CAPSULE BY MOUTH ONCE DAILY AT BEDTIME AS NEEDED FOR SLEEP, Disp: 90 capsule, Rfl: 1 .  triamterene-hydrochlorothiazide (MAXZIDE-25) 37.5-25 MG tablet, Take 1 tablet by mouth daily., Disp: 90 tablet, Rfl: PRN  History of Present Illness: Here for pap smear. Last pap 5/15, normal.  Mammogram 9/17:  Had to go back for [place], findings all normal.  PCP watches labs  Review of Systems   Patient denies any headaches, blurred vision, shortness of breath, chest pain, abdominal pain, problems with bowel movements, urination, or intercourse.   Physical Exam: General:  Well developed, well nourished, no acute distress Skin:  Warm and dry Neck:  Midline trachea, normal thyroid Lungs; Clear to auscultation bilaterally Breast:  Deferred d/t recent mammogram/US Cardiovascular: Regular rate and rhythm Abdomen:  Soft, non tender, no hepatosplenomegaly Pelvic:  External genitalia is normal in appearance.  The vagina is normal in appearance.  The cervix is bulbous.  Uterus is felt to be normal size, shape, and contour.  No adnexal masses or tenderness noted.  Exam limited by body habitus Extremities:  No swelling or varicosities noted Psych:  No mood changes.     Impression: normal GYN exam     Plan: if normal, pap q 3 years

## 2016-05-10 LAB — CYTOLOGY - PAP

## 2016-05-14 ENCOUNTER — Ambulatory Visit (INDEPENDENT_AMBULATORY_CARE_PROVIDER_SITE_OTHER): Payer: 59

## 2016-05-14 DIAGNOSIS — Z23 Encounter for immunization: Secondary | ICD-10-CM | POA: Diagnosis not present

## 2016-05-18 MED FILL — TEMAZEPAM 30 MG CAPSULE: 30 | 90 days supply | Qty: 90 | Fill #1

## 2016-05-19 ENCOUNTER — Other Ambulatory Visit: Payer: Self-pay

## 2016-05-19 MED ORDER — HYDROCODONE-ACETAMINOPHEN 5-325 MG PO TABS: ORAL_TABLET | ORAL | 0 refills | Status: DC

## 2016-06-06 ENCOUNTER — Ambulatory Visit: Payer: 59 | Admitting: Family Medicine

## 2016-06-06 ENCOUNTER — Other Ambulatory Visit: Payer: Self-pay

## 2016-06-06 DIAGNOSIS — F411 Generalized anxiety disorder: Secondary | ICD-10-CM

## 2016-06-06 MED ORDER — ALPRAZOLAM 0.25 MG PO TABS: 0.2500 mg | ORAL_TABLET | Freq: Every day | ORAL | 0 refills | Status: DC

## 2016-06-15 ENCOUNTER — Other Ambulatory Visit: Payer: Self-pay

## 2016-06-15 MED ORDER — HYDROCODONE-ACETAMINOPHEN 5-325 MG PO TABS: ORAL_TABLET | ORAL | 0 refills | Status: DC

## 2016-06-20 MED FILL — TRIAMTERENE-HCTZ 37.5-25 MG: 37.5-25 | 90 days supply | Qty: 90 | Fill #2

## 2016-07-02 MED FILL — METOPROLOL TARTRATE 25 MG T: 25 | 90 days supply | Qty: 90 | Fill #1

## 2016-07-11 ENCOUNTER — Encounter: Payer: Self-pay | Admitting: Family Medicine

## 2016-07-11 ENCOUNTER — Ambulatory Visit (INDEPENDENT_AMBULATORY_CARE_PROVIDER_SITE_OTHER): Payer: 59 | Admitting: Family Medicine

## 2016-07-11 VITALS — BP 118/80 | HR 70 | Resp 16 | Ht 65.0 in | Wt 217.0 lb

## 2016-07-11 DIAGNOSIS — E669 Obesity, unspecified: Secondary | ICD-10-CM | POA: Diagnosis not present

## 2016-07-11 DIAGNOSIS — E1169 Type 2 diabetes mellitus with other specified complication: Secondary | ICD-10-CM | POA: Diagnosis not present

## 2016-07-11 DIAGNOSIS — I10 Essential (primary) hypertension: Secondary | ICD-10-CM | POA: Diagnosis not present

## 2016-07-11 DIAGNOSIS — E784 Other hyperlipidemia: Secondary | ICD-10-CM

## 2016-07-11 DIAGNOSIS — R7303 Prediabetes: Secondary | ICD-10-CM | POA: Diagnosis not present

## 2016-07-11 DIAGNOSIS — Z23 Encounter for immunization: Secondary | ICD-10-CM

## 2016-07-11 DIAGNOSIS — E785 Hyperlipidemia, unspecified: Secondary | ICD-10-CM

## 2016-07-11 DIAGNOSIS — E119 Type 2 diabetes mellitus without complications: Secondary | ICD-10-CM

## 2016-07-11 DIAGNOSIS — E8881 Metabolic syndrome: Secondary | ICD-10-CM

## 2016-07-11 DIAGNOSIS — F411 Generalized anxiety disorder: Secondary | ICD-10-CM

## 2016-07-11 DIAGNOSIS — F5102 Adjustment insomnia: Secondary | ICD-10-CM

## 2016-07-11 DIAGNOSIS — E66811 Obesity, class 1: Secondary | ICD-10-CM

## 2016-07-11 LAB — BASIC METABOLIC PANEL WITH GFR
BUN: 14 mg/dL (ref 7–25)
CHLORIDE: 105 mmol/L (ref 98–110)
CO2: 28 mmol/L (ref 20–31)
Calcium: 8.8 mg/dL (ref 8.6–10.2)
Creat: 0.9 mg/dL (ref 0.50–1.10)
GFR, EST NON AFRICAN AMERICAN: 77 mL/min (ref >=60)
GFR, Est African American: 89 mL/min (ref >=60)
Glucose, Bld: 126 mg/dL — ABNORMAL HIGH (ref 65–99)
Potassium: 4.4 mmol/L (ref 3.5–5.3)
SODIUM: 141 mmol/L (ref 135–146)

## 2016-07-11 MED ORDER — FLUOXETINE HCL 40 MG PO CAPS: 40.0000 mg | ORAL_CAPSULE | Freq: Every day | ORAL | 3 refills | Status: DC

## 2016-07-11 MED ORDER — METFORMIN HCL ER (MOD) 500 MG PO TB24: 500.0000 mg | ORAL_TABLET | Freq: Every day | ORAL | 1 refills | Status: DC

## 2016-07-11 MED FILL — FLUoxetine HCL 40 MG CAPS: 40 | 90 days supply | Qty: 90 | Fill #0

## 2016-07-11 NOTE — Assessment & Plan Note (Signed)
Sleep hygiene reviewed and written information offered also. Prescription sent for  medication needed.  

## 2016-07-11 NOTE — Assessment & Plan Note (Signed)
Deteriorated. Patient re-educated about  the importance of commitment to a  minimum of 150 minutes of exercise per week.  The importance of healthy food choices with portion control discussed. Encouraged to start a food diary, count calories and to consider  joining a support group. Sample diet sheets offered. Goals set by the patient for the next several months.   Weight /BMI 07/11/2016 05/09/2016 02/02/2016  WEIGHT 217 lb 215 lb 214 lb  HEIGHT 5\' 5"  5\' 5"  5' 6.5"  BMI 36.11 kg/m2 35.78 kg/m2 34.03 kg/m2

## 2016-07-11 NOTE — Progress Notes (Signed)
Mary Roman     MRN: [id number]      DOB: April 01, 1971   HPI Mary Roman is here for follow up and re-evaluation of chronic medical conditions, medication management and review of any available recent lab and radiology data.  Preventive health is updated, specifically  Cancer screening and Immunization.   Questions or concerns regarding consultations or procedures which Mary Roman has had in Mary interim are  addressed. Mary Roman that she has been intolerant of metformin 1000 mg, and has been taking Mary 500 mg dose. Has not been testing sugar, increased stress due to new dx of colon cancer in her mom, emotional eating . 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 Denies sinus pressure, nasal congestion, ear pain or sore throat. Denies chest congestion, productive cough or wheezing. Denies chest pains, palpitations and leg swelling Denies abdominal pain, nausea, vomiting,diarrhea or constipation.   Denies dysuria, frequency, hesitancy or incontinence.  Denies headaches, seizures, numbness, or tingling. Denies skin break down or rash.   PE  BP 118/80   Pulse 70   Resp 16   Ht 5\' 5"  (1.651 m)   Wt 217 lb (98.4 kg)   SpO2 97%   BMI 36.11 kg/m   Mary Roman alert and oriented and in no cardiopulmonary distress.  HEENT: No facial asymmetry, EOMI,   oropharynx pink and moist.  Neck supple no JVD, no mass.  Chest: Clear to auscultation bilaterally.  CVS: S1, S2 no murmurs, no S3.Regular rate.  ABD: Soft non tender.   Ext: No edema  MS: Adequate though reduced  ROM spine, normal in shoulders, hips and knees.  Skin: Intact, no ulcerations or rash noted.  Psych: Good eye contact, normal affect. Memory intact not anxious or depressed appearing.  CNS: CN 2-12 intact, power,   normal throughout.no focal deficits noted.   Assessment & Plan  Essential hypertension Controlled, no change in medication DASH diet and commitment to daily physical activity for a minimum of 30 minutes discussed and encouraged, as a part of hypertension management. Mary importance of attaining a healthy weight is also discussed.  BP/Weight 07/11/2016 05/09/2016 02/02/2016 08/29/2015 04/25/2015 12/20/2014 [id number]  Systolic BP 123456 123456 123456 0000000 123456 [id number]  Diastolic BP 80 88 80 70 78 80 78  Wt. (Lbs) 217 215 214 205 216 211 207  BMI 36.11 35.78 34.03 32.6 34.88 34.07 33.43       Diabetes mellitus type 2 in obese [facility]) Updated lab needed at/ before next visit. Mary Roman is reminded of Mary importance of commitment to daily physical activity for 30 minutes or more, as able and Mary need to limit carbohydrate intake to 30 to 60 grams per meal to help with blood sugar control.   Mary need to take medication as prescribed, test blood sugar as directed, and to call between visits if there is a concern that blood sugar is uncontrolled is also discussed.   Mary Roman is reminded of Mary importance of daily foot exam, annual eye examination, and good blood sugar, blood pressure and cholesterol control.  Diabetic Labs Latest Ref Rng & Units 02/01/2016 09/16/2015 08/31/2014 02/25/2014 02/19/2013  HbA1c <5.7 % 6.4(H) 6.7(H) 6.4(H) 6.6(H) 6.1(H)  Chol 125 - 200 mg/dL - 155 156 153 -  HDL >=46  mg/dL - 37(L) 39(L) 35(L) -  Calc LDL <130 mg/dL - 104 107(H) 106(H) -  Triglycerides <150 mg/dL - 69 49 60 -  Creatinine 0.50 - 1.10 mg/dL 0.87 0.90 0.86 0.84 0.85   BP/Weight 07/11/2016 05/09/2016 02/02/2016 08/29/2015 04/25/2015 12/20/2014 [id number]  Systolic BP 123456 123456 123456 0000000 123456 [id number]  Diastolic BP 80 88 80 70 78 80 78  Wt. (Lbs) 217 215 214 205 216 211 207  BMI 36.11 35.78 34.03 32.6 34.88 34.07 33.43   Foot/eye exam completion dates Latest Ref Rng & Units 02/02/2016 10/03/2015  Eye Exam No Retinopathy -  No Retinopathy  Foot Form Completion - Done -        Obesity (BMI 30.0-34.9) Deteriorated. Mary Roman re-educated about  Mary importance of commitment to a  minimum of 150 minutes of exercise per week.  Mary importance of healthy food choices with portion control discussed. Encouraged to start a food diary, count calories and to consider  joining a support group. Sample diet sheets offered. Goals set by Mary Mary Roman for Mary next several months.   Weight /BMI 07/11/2016 05/09/2016 02/02/2016  WEIGHT 217 lb 215 lb 214 lb  HEIGHT 5\' 5"  5\' 5"  5' 6.5"  BMI 36.11 kg/m2 35.78 kg/m2 34.03 kg/m2      Metabolic syndrome X Mary increased risk of cardiovascular disease associated with this diagnosis, and Mary need to consistently work on lifestyle to change this is discussed. Following  a  heart healthy diet ,commitment to 30 minutes of exercise at least 5 days per week, as well as control of blood sugar and cholesterol , and achieving a healthy weight are all Mary areas to be addressed .   Dyslipidemia (high LDL; low HDL) Hyperlipidemia:Low fat diet discussed and encouraged.   Lipid Panel  Lab Results  Component Value Date   CHOL 155 09/16/2015   HDL 37 (L) 09/16/2015   LDLCALC 104 09/16/2015   TRIG 69 09/16/2015   CHOLHDL 4.2 09/16/2015       Generalized anxiety disorder Increased and uncontrolled , recommended resuming therapy  Need for 23-polyvalent pneumococcal polysaccharide vaccine After obtaining informed consent, Mary vaccine is  administered by LPN.   Insomnia Sleep hygiene reviewed and written information offered also. Prescription sent for  medication needed.

## 2016-07-11 NOTE — Patient Instructions (Signed)
F/u in 4 months, call if you need me before  Med changed to metformin 500 mg daily  Return stool card and microalb off of cycle     You need to go for therapy to help with better lifestyle management  It is important that you exercise regularly at least 30 minutes 7 times a week. If you develop chest pain, have severe difficulty breathing, or feel very tired, stop exercising immediately and seek medical attention    Please work on good  health habits so that your health will improve. 1. Commitment to daily physical activity for 30 to 60  minutes, if you are able to do this.  2. Commitment to wise food choices. Aim for half of your  food intake to be vegetable and fruit, one quarter starchy foods, and one quarter protein. Try to eat on a regular schedule  3 meals per day, snacking between meals should be limited to vegetables or fruits or small portions of nuts. 64 ounces of water per day is generally recommended, unless you have specific health conditions, like heart failure or kidney failure where you will need to limit fluid intake.  3. Commitment to sufficient and a  good quality of physical and mental rest daily, generally between 6 to 8 hours per day.  WITH PERSISTANCE AND PERSEVERANCE, THE IMPOSSIBLE , BECOMES THE NORM!

## 2016-07-11 NOTE — Assessment & Plan Note (Signed)
Controlled, no change in medication DASH diet and commitment to daily physical activity for a minimum of 30 minutes discussed and encouraged, as a part of hypertension management. The importance of attaining a healthy weight is also discussed.  BP/Weight 07/11/2016 05/09/2016 02/02/2016 08/29/2015 04/25/2015 12/20/2014 [id number]  Systolic BP 123456 123456 123456 0000000 123456 [id number]  Diastolic BP 80 88 80 70 78 80 78  Wt. (Lbs) 217 215 214 205 216 211 207  BMI 36.11 35.78 34.03 32.6 34.88 34.07 33.43

## 2016-07-11 NOTE — Assessment & Plan Note (Signed)
After obtaining informed consent, the vaccine is  administered by LPN.  

## 2016-07-11 NOTE — Assessment & Plan Note (Signed)
Hyperlipidemia:Low fat diet discussed and encouraged.   Lipid Panel  Lab Results  Component Value Date   CHOL 155 09/16/2015   HDL 37 (L) 09/16/2015   LDLCALC 104 09/16/2015   TRIG 69 09/16/2015   CHOLHDL 4.2 09/16/2015

## 2016-07-11 NOTE — Assessment & Plan Note (Signed)
Updated lab needed at/ before next visit. [name] is reminded of the importance of commitment to daily physical activity for 30 minutes or more, as able and the need to limit carbohydrate intake to 30 to 60 grams per meal to help with blood sugar control.   The need to take medication as prescribed, test blood sugar as directed, and to call between visits if there is a concern that blood sugar is uncontrolled is also discussed.   [name] is reminded of the importance of daily foot exam, annual eye examination, and good blood sugar, blood pressure and cholesterol control.  Diabetic Labs Latest Ref Rng & Units 02/01/2016 09/16/2015 08/31/2014 02/25/2014 02/19/2013  HbA1c <5.7 % 6.4(H) 6.7(H) 6.4(H) 6.6(H) 6.1(H)  Chol 125 - 200 mg/dL - 155 156 153 -  HDL >=46 mg/dL - 37(L) 39(L) 35(L) -  Calc LDL <130 mg/dL - 104 107(H) 106(H) -  Triglycerides <150 mg/dL - 69 49 60 -  Creatinine 0.50 - 1.10 mg/dL 0.87 0.90 0.86 0.84 0.85   BP/Weight 07/11/2016 05/09/2016 02/02/2016 08/29/2015 04/25/2015 12/20/2014 [id number]  Systolic BP 123456 123456 123456 0000000 123456 [id number]  Diastolic BP 80 88 80 70 78 80 78  Wt. (Lbs) 217 215 214 205 216 211 207  BMI 36.11 35.78 34.03 32.6 34.88 34.07 33.43   Foot/eye exam completion dates Latest Ref Rng & Units 02/02/2016 10/03/2015  Eye Exam No Retinopathy - No Retinopathy  Foot Form Completion - Done -

## 2016-07-11 NOTE — Assessment & Plan Note (Signed)
Increased and uncontrolled , recommended resuming therapy

## 2016-07-11 NOTE — Assessment & Plan Note (Signed)
The increased risk of cardiovascular disease associated with this diagnosis, and the need to consistently work on lifestyle to change this is discussed. Following  a  heart healthy diet ,commitment to 30 minutes of exercise at least 5 days per week, as well as control of blood sugar and cholesterol , and achieving a healthy weight are all the areas to be addressed .  

## 2016-07-12 ENCOUNTER — Encounter: Payer: Self-pay | Admitting: Family Medicine

## 2016-07-12 LAB — HEMOGLOBIN A1C
Hgb A1c MFr Bld: 6.3 % — ABNORMAL HIGH (ref <5.7)
Mean Plasma Glucose: 134 mg/dL

## 2016-07-13 ENCOUNTER — Other Ambulatory Visit: Payer: Self-pay

## 2016-07-13 MED ORDER — HYDROCODONE-ACETAMINOPHEN 5-325 MG PO TABS: ORAL_TABLET | ORAL | 0 refills | Status: DC

## 2016-08-10 ENCOUNTER — Other Ambulatory Visit: Payer: Self-pay

## 2016-08-10 MED ORDER — HYDROCODONE-ACETAMINOPHEN 5-325 MG PO TABS: ORAL_TABLET | ORAL | 0 refills | Status: DC

## 2016-08-23 ENCOUNTER — Ambulatory Visit: Payer: 59 | Admitting: Family Medicine

## 2016-08-24 ENCOUNTER — Ambulatory Visit (INDEPENDENT_AMBULATORY_CARE_PROVIDER_SITE_OTHER): Payer: 59 | Admitting: Family Medicine

## 2016-08-24 DIAGNOSIS — I10 Essential (primary) hypertension: Secondary | ICD-10-CM

## 2016-08-24 MED ORDER — HYDROCODONE-ACETAMINOPHEN 5-325 MG PO TABS: ORAL_TABLET | ORAL | 0 refills | Status: DC

## 2016-08-26 NOTE — Progress Notes (Signed)
Pt did not keep appt

## 2016-09-11 ENCOUNTER — Ambulatory Visit (INDEPENDENT_AMBULATORY_CARE_PROVIDER_SITE_OTHER): Payer: 59 | Admitting: Family Medicine

## 2016-09-11 ENCOUNTER — Encounter: Payer: Self-pay | Admitting: Family Medicine

## 2016-09-11 ENCOUNTER — Other Ambulatory Visit (HOSPITAL_COMMUNITY)
Admission: RE | Admit: 2016-09-11 | Discharge: 2016-09-11 | Disposition: A | Discharge status: Home / Self Care | Payer: 59 | Source: Ambulatory Visit | Attending: Family Medicine | Admitting: Family Medicine

## 2016-09-11 VITALS — BP 124/80 | HR 64 | Resp 16 | Ht 65.0 in | Wt 227.0 lb

## 2016-09-11 DIAGNOSIS — E1169 Type 2 diabetes mellitus with other specified complication: Secondary | ICD-10-CM | POA: Insufficient documentation

## 2016-09-11 DIAGNOSIS — G8929 Other chronic pain: Secondary | ICD-10-CM | POA: Diagnosis not present

## 2016-09-11 DIAGNOSIS — E669 Obesity, unspecified: Secondary | ICD-10-CM | POA: Insufficient documentation

## 2016-09-11 DIAGNOSIS — E8881 Metabolic syndrome: Secondary | ICD-10-CM | POA: Diagnosis not present

## 2016-09-11 DIAGNOSIS — I10 Essential (primary) hypertension: Secondary | ICD-10-CM | POA: Diagnosis not present

## 2016-09-11 LAB — PROTEIN / CREATININE RATIO, URINE
CREATININE, URINE: 188.72 mg/dL
Protein Creatinine Ratio: 0.07 mg/mg{Cre} (ref 0.00–0.15)
Total Protein, Urine: 14 mg/dL

## 2016-09-11 MED ORDER — GABAPENTIN 100 MG PO CAPS: 100.0000 mg | ORAL_CAPSULE | Freq: Three times a day (TID) | ORAL | 1 refills | Status: DC

## 2016-09-11 MED ORDER — HYDROCODONE-ACETAMINOPHEN 5-325 MG PO TABS: ORAL_TABLET | ORAL | 0 refills | Status: DC

## 2016-09-11 MED FILL — GABAPENTIN 100 MG CAPSULE: 100 | 30 days supply | Qty: 90 | Fill #0

## 2016-09-11 NOTE — Patient Instructions (Addendum)
F/u in 3 months for pain and  Chronic disease management  Cancel  April appt.  New for pain is gabapentin 100 mg one three times daily, OR one in morning and 2 at night OR 3 at night  No change in hydrocodone script   Weight loss , and back exercises, and massages with topical med  Thank you  for choosing [facility]. We consider it a privelige to serve you.  Delivering excellent health care in a caring and  compassionate way is our goal.  Partnering with you,  so that together we can achieve this goal is our strategy.           Back Exercises Introduction If you have pain in your back, do these exercises 2-3 times each day or as told by your doctor. When the pain goes away, do the exercises once each day, but repeat the steps more times for each exercise (do more repetitions). If you do not have pain in your back, do these exercises once each day or as told by your doctor. Exercises Single Knee to Chest  Do these steps 3-5 times in a row for each leg: 1. Lie on your back on a firm bed or the floor with your legs stretched out. 2. Bring one knee to your chest. 3. Hold your knee to your chest by grabbing your knee or thigh. 4. Pull on your knee until you feel a gentle stretch in your lower back. 5. Keep doing the stretch for 10-30 seconds. 6. Slowly let go of your leg and straighten it. Pelvic Tilt  Do these steps 5-10 times in a row: 1. Lie on your back on a firm bed or the floor with your legs stretched out. 2. Bend your knees so they point up to the ceiling. Your feet should be flat on the floor. 3. Tighten your lower belly (abdomen) muscles to press your lower back against the floor. This will make your tailbone point up to the ceiling instead of pointing down to your feet or the floor. 4. Stay in this position for 5-10 seconds while you gently tighten your muscles and breathe evenly. Cat-Cow  Do these steps until your lower back bends more easily: 1. Get  on your hands and knees on a firm surface. Keep your hands under your shoulders, and keep your knees under your hips. You may put padding under your knees. 2. Let your head hang down, and make your tailbone point down to the floor so your lower back is round like the back of a cat. 3. Stay in this position for 5 seconds. 4. Slowly lift your head and make your tailbone point up to the ceiling so your back hangs low (sags) like the back of a cow. 5. Stay in this position for 5 seconds. Press-Ups  Do these steps 5-10 times in a row: 1. Lie on your belly (face-down) on the floor. 2. Place your hands near your head, about shoulder-width apart. 3. While you keep your back relaxed and keep your hips on the floor, slowly straighten your arms to raise the top half of your body and lift your shoulders. Do not use your back muscles. To make yourself more comfortable, you may change where you place your hands. 4. Stay in this position for 5 seconds. 5. Slowly return to lying flat on the floor. Bridges  Do these steps 10 times in a row: 1. Lie on your back on a firm surface. 2. [place] your  knees so they point up to the ceiling. Your feet should be flat on the floor. 3. Tighten your butt muscles and lift your butt off of the floor until your waist is almost as high as your knees. If you do not feel the muscles working in your butt and the back of your thighs, slide your feet 1-2 inches farther away from your butt. 4. Stay in this position for 3-5 seconds. 5. Slowly lower your butt to the floor, and let your butt muscles relax. If this exercise is too easy, try doing it with your arms crossed over your chest. Belly Crunches  Do these steps 5-10 times in a row: 1. Lie on your back on a firm bed or the floor with your legs stretched out. 2. Bend your knees so they point up to the ceiling. Your feet should be flat on the floor. 3. Cross your arms over your chest. 4. Tip your chin a little bit toward your chest  but do not bend your neck. 5. Tighten your belly muscles and slowly raise your chest just enough to lift your shoulder blades a tiny bit off of the floor. 6. Slowly lower your chest and your head to the floor. Back Lifts  Do these steps 5-10 times in a row: 1. Lie on your belly (face-down) with your arms at your sides, and rest your forehead on the floor. 2. Tighten the muscles in your legs and your butt. 3. Slowly lift your chest off of the floor while you keep your hips on the floor. Keep the back of your head in line with the curve in your back. Look at the floor while you do this. 4. Stay in this position for 3-5 seconds. 5. Slowly lower your chest and your face to the floor. Contact a doctor if:  Your back pain gets a lot worse when you do an exercise.  Your back pain does not lessen 2 hours after you exercise. If you have any of these problems, stop doing the exercises. Do not do them again unless your doctor says it is okay. Get help right away if:  You have sudden, very bad back pain. If this happens, stop doing the exercises. Do not do them again unless your doctor says it is okay. This information is not intended to replace advice given to you by your health care provider. Make sure you discuss any questions you have with your health care provider. Document Released: 08/18/2010 Document Revised: 12/22/2015 Document Reviewed: 09/09/2014  2017 [employer]vier

## 2016-09-12 LAB — MICROALBUMIN, URINE: Microalb, Ur: 26.9 ug/mL — ABNORMAL HIGH

## 2016-09-14 ENCOUNTER — Encounter: Payer: Self-pay | Admitting: Family Medicine

## 2016-09-14 DIAGNOSIS — G8929 Other chronic pain: Secondary | ICD-10-CM | POA: Insufficient documentation

## 2016-09-14 DIAGNOSIS — R52 Pain, unspecified: Secondary | ICD-10-CM | POA: Insufficient documentation

## 2016-09-14 NOTE — Assessment & Plan Note (Signed)
Controlled, no change in medication DASH diet and commitment to daily physical activity for a minimum of 30 minutes discussed and encouraged, as a part of hypertension management. The importance of attaining a healthy weight is also discussed.  BP/Weight 09/11/2016 07/11/2016 05/09/2016 02/02/2016 08/29/2015 04/25/2015 [id number]  Systolic BP A999333 123456 123456 123456 0000000 123456 [id number]  Diastolic BP 80 80 88 80 70 78 80  Wt. (Lbs) 227 217 215 214 205 216 211  BMI 37.77 36.11 35.78 34.03 32.6 34.88 34.07

## 2016-09-14 NOTE — Assessment & Plan Note (Signed)
Updated lab needed at/ before next visit. Mary Roman is reminded of the importance of commitment to daily physical activity for 30 minutes or more, as able and the need to limit carbohydrate intake to 30 to 60 grams per meal to help with blood sugar control.   The need to take medication as prescribed, test blood sugar as directed, and to call between visits if there is a concern that blood sugar is uncontrolled is also discussed.   Mary Roman is reminded of the importance of daily foot exam, annual eye examination, and good blood sugar, blood pressure and cholesterol control.  Diabetic Labs Latest Ref Rng & Units 09/11/2016 07/11/2016 02/01/2016 09/16/2015 08/31/2014  HbA1c <5.7 % - 6.3(H) 6.4(H) 6.7(H) 6.4(H)  Microalbumin Not Estab. ug/mL 26.9(H) - - - -  Chol 125 - 200 mg/dL - - - 155 156  HDL >=46 mg/dL - - - 37(L) 39(L)  Calc LDL <130 mg/dL - - - 104 107(H)  Triglycerides <150 mg/dL - - - 69 49  Creatinine 0.50 - 1.10 mg/dL - 0.90 0.87 0.90 0.86   BP/Weight 09/11/2016 07/11/2016 05/09/2016 02/02/2016 08/29/2015 04/25/2015 [id number]  Systolic BP A999333 123456 123456 123456 0000000 123456 [id number]  Diastolic BP 80 80 88 80 70 78 80  Wt. (Lbs) 227 217 215 214 205 216 211  BMI 37.77 36.11 35.78 34.03 32.6 34.88 34.07   Foot/eye exam completion dates Latest Ref Rng & Units 02/02/2016 10/03/2015  Eye Exam No Retinopathy - No Retinopathy  Foot Form Completion - Done -

## 2016-09-14 NOTE — Assessment & Plan Note (Signed)
Deteriorated. Patient re-educated about  the importance of commitment to a  minimum of 150 minutes of exercise per week.  The importance of healthy food choices with portion control discussed. Encouraged to start a food diary, count calories and to consider  joining a support group. Sample diet sheets offered. Goals set by the patient for the next several months.   Weight /BMI 09/11/2016 07/11/2016 05/09/2016  WEIGHT 227 lb 217 lb 215 lb  HEIGHT 5\' 5"  5\' 5"  5\' 5"   BMI 37.77 kg/m2 36.11 kg/m2 35.78 kg/m2

## 2016-09-14 NOTE — Progress Notes (Signed)
Mary Roman     MRN: [id number]      DOB: [birth year]   HPI Mary Roman is here for follow up and re-evaluation of chronic medical conditions, medication management and review of any available recent lab and radiology data.  New pain policy is specific and main focus of the visit Preventive health is updated, specifically  Cancer screening and Immunization.   Questions or concerns regarding consultations or procedures which the PT has had in the interim are  addressed. The PT denies any adverse reactions to current medications since the last visit.  There are no new concerns.  There are no specific complaints   ROS Denies recent fever or chills. Denies sinus pressure, nasal congestion, ear pain or sore throat. Denies chest congestion, productive cough or wheezing. Denies chest pains, palpitations and leg swelling Denies abdominal pain, nausea, vomiting,diarrhea or constipation.   Denies dysuria, frequency, hesitancy or incontinence. C/o chronic joint pain, swelling and limitation in mobility. Denies headaches, seizures, numbness, or tingling. Denies depression, anxiety or insomnia. Denies skin break down or rash.   PE  BP 124/80   Pulse 64   Resp 16   Ht 5\' 5"  (1.651 m)   Wt 227 lb (103 kg)   SpO2 98%   BMI 37.77 kg/m   Patient alert and oriented and in no cardiopulmonary distress.  HEENT: No facial asymmetry, EOMI,   oropharynx pink and moist.  Neck supple no JVD, no mass.  Chest: Clear to auscultation bilaterally.  CVS: S1, S2 no murmurs, no S3.Regular rate.  ABD: Soft non tender.   Ext: No edema  MS: decreased  ROM spine, shoulders, hips and knees.  Skin: Intact, no ulcerations or rash noted.  Psych: Good eye contact, normal affect. Memory intact not anxious or depressed appearing.  CNS: CN 2-12 intact, power,  normal throughout.no focal deficits noted.   Assessment & Plan  Encounter for chronic pain management Adequacy of current pain  management is reviewed and new medication and strategies to be employed in an effort to reduce use of narcotic med. No dose reduction to be made at this time Pain contract reviewed and signed [place] reviewed and will be put in record. New pain prescribing policy implemented All questions/ concerns were answered to pt's satisfaction  Diabetes mellitus type 2 in obese [facility]) Updated lab needed at/ before next visit. Mary Roman is reminded of the importance of commitment to daily physical activity for 30 minutes or more, as able and the need to limit carbohydrate intake to 30 to 60 grams per meal to help with blood sugar control.   The need to take medication as prescribed, test blood sugar as directed, and to call between visits if there is a concern that blood sugar is uncontrolled is also discussed.   Mary Roman is reminded of the importance of daily foot exam, annual eye examination, and good blood sugar, blood pressure and cholesterol control.  Diabetic Labs Latest Ref Rng & Units 09/11/2016 07/11/2016 02/01/2016 09/16/2015 08/31/2014  HbA1c <5.7 % - 6.3(H) 6.4(H) 6.7(H) 6.4(H)  Microalbumin Not Estab. ug/mL 26.9(H) - - - -  Chol 125 - 200 mg/dL - - - 155 156  HDL >=46 mg/dL - - - 37(L) 39(L)  Calc LDL <130 mg/dL - - - 104 107(H)  Triglycerides <150 mg/dL - - - 69 49  Creatinine 0.50 - 1.10 mg/dL - 0.90 0.87 0.90 0.86   BP/Weight 09/11/2016 07/11/2016 05/09/2016 02/02/2016 08/29/2015 04/25/2015 [id number]  Systolic  BP 124 118 118 120 0000000 [id number]  Diastolic BP 80 80 88 80 70 78 80  Wt. (Lbs) 227 217 215 214 205 216 211  BMI 37.77 36.11 35.78 34.03 32.6 34.88 34.07   Foot/eye exam completion dates Latest Ref Rng & Units 02/02/2016 10/03/2015  Eye Exam No Retinopathy - No Retinopathy  Foot Form Completion - Done -        Essential hypertension Controlled, no change in medication DASH diet and commitment to daily physical activity for a minimum of 30 minutes discussed and  encouraged, as a part of hypertension management. The importance of attaining a healthy weight is also discussed.  BP/Weight 09/11/2016 07/11/2016 05/09/2016 02/02/2016 08/29/2015 04/25/2015 [id number]  Systolic BP A999333 123456 123456 123456 0000000 123456 [id number]  Diastolic BP 80 80 88 80 70 78 80  Wt. (Lbs) 227 217 215 214 205 216 211  BMI 37.77 36.11 35.78 34.03 32.6 34.88 0000000       Metabolic syndrome X Deteriorated. Patient re-educated about  the importance of commitment to a  minimum of 150 minutes of exercise per week.  The importance of healthy food choices with portion control discussed. Encouraged to start a food diary, count calories and to consider  joining a support group. Sample diet sheets offered. Goals set by the patient for the next several months.   Weight /BMI 09/11/2016 07/11/2016 05/09/2016  WEIGHT 227 lb 217 lb 215 lb  HEIGHT 5\' 5"  5\' 5"  5\' 5"   BMI 37.77 kg/m2 36.11 kg/m2 35.78 kg/m2

## 2016-09-14 NOTE — Assessment & Plan Note (Signed)
Adequacy of current pain management is reviewed and new medication and strategies to be employed in an effort to reduce use of narcotic med. No dose reduction to be made at this time Pain contract reviewed and signed [place] reviewed and will be put in record. New pain prescribing policy implemented All questions/ concerns were answered to pt's satisfaction

## 2016-10-01 MED FILL — TRIAMTERENE-HCTZ 37.5-25 MG: 37.5-25 | 90 days supply | Qty: 90 | Fill #3

## 2016-10-02 ENCOUNTER — Other Ambulatory Visit: Payer: Self-pay

## 2016-10-02 MED ORDER — TEMAZEPAM 30 MG PO CAPS: ORAL_CAPSULE | ORAL | 1 refills | Status: DC

## 2016-10-02 MED FILL — TEMAZEPAM 30 MG CAPSULE: 30 | 90 days supply | Qty: 90 | Fill #0

## 2016-11-01 ENCOUNTER — Ambulatory Visit: Payer: 59 | Admitting: Family Medicine

## 2016-12-12 DIAGNOSIS — E1169 Type 2 diabetes mellitus with other specified complication: Secondary | ICD-10-CM | POA: Diagnosis not present

## 2016-12-12 DIAGNOSIS — E669 Obesity, unspecified: Secondary | ICD-10-CM | POA: Diagnosis not present

## 2016-12-12 LAB — MICROALBUMIN / CREATININE URINE RATIO
CREATININE, URINE: 292 mg/dL (ref 20–320)
MICROALB UR: 2.7 mg/dL
MICROALB/CREAT RATIO: 9 ug/mg{creat} (ref <30)

## 2016-12-13 ENCOUNTER — Ambulatory Visit (INDEPENDENT_AMBULATORY_CARE_PROVIDER_SITE_OTHER): Payer: 59 | Admitting: Family Medicine

## 2016-12-13 ENCOUNTER — Encounter: Payer: Self-pay | Admitting: Family Medicine

## 2016-12-13 VITALS — BP 120/76 | HR 76 | Ht 65.0 in | Wt 216.0 lb

## 2016-12-13 DIAGNOSIS — E785 Hyperlipidemia, unspecified: Secondary | ICD-10-CM | POA: Diagnosis not present

## 2016-12-13 DIAGNOSIS — I10 Essential (primary) hypertension: Secondary | ICD-10-CM | POA: Diagnosis not present

## 2016-12-13 DIAGNOSIS — F322 Major depressive disorder, single episode, severe without psychotic features: Secondary | ICD-10-CM

## 2016-12-13 DIAGNOSIS — E8881 Metabolic syndrome: Secondary | ICD-10-CM

## 2016-12-13 DIAGNOSIS — E1169 Type 2 diabetes mellitus with other specified complication: Secondary | ICD-10-CM

## 2016-12-13 DIAGNOSIS — F411 Generalized anxiety disorder: Secondary | ICD-10-CM

## 2016-12-13 DIAGNOSIS — G8929 Other chronic pain: Secondary | ICD-10-CM | POA: Diagnosis not present

## 2016-12-13 DIAGNOSIS — E669 Obesity, unspecified: Secondary | ICD-10-CM

## 2016-12-13 DIAGNOSIS — E559 Vitamin D deficiency, unspecified: Secondary | ICD-10-CM | POA: Diagnosis not present

## 2016-12-13 DIAGNOSIS — E119 Type 2 diabetes mellitus without complications: Secondary | ICD-10-CM | POA: Diagnosis not present

## 2016-12-13 MED ORDER — HYDROXYZINE HCL 10 MG PO TABS: 10.0000 mg | ORAL_TABLET | Freq: Every evening | ORAL | 1 refills | Status: DC | PRN

## 2016-12-13 MED ORDER — HYDROCODONE-ACETAMINOPHEN 5-325 MG PO TABS: ORAL_TABLET | ORAL | 0 refills | Status: DC

## 2016-12-13 MED ORDER — FLUOXETINE HCL 40 MG PO CAPS: 40.0000 mg | ORAL_CAPSULE | Freq: Every day | ORAL | 3 refills | Status: DC

## 2016-12-13 MED ORDER — TRIAMTERENE-HCTZ 37.5-25 MG PO TABS: 1.0000 | ORAL_TABLET | Freq: Every day | ORAL | 99 refills | Status: DC

## 2016-12-13 MED ORDER — ALPRAZOLAM 0.25 MG PO TABS: 0.2500 mg | ORAL_TABLET | Freq: Every day | ORAL | 0 refills | Status: DC

## 2016-12-13 MED ORDER — METOPROLOL TARTRATE 25 MG PO TABS: ORAL_TABLET | ORAL | 1 refills | Status: DC

## 2016-12-13 MED ORDER — BUSPIRONE HCL 7.5 MG PO TABS: 7.5000 mg | ORAL_TABLET | Freq: Three times a day (TID) | ORAL | 99 refills | Status: DC

## 2016-12-13 MED FILL — METOPROLOL TARTRATE 25 MG T: 25 | 90 days supply | Qty: 90 | Fill #0

## 2016-12-13 MED FILL — hydrOXYzine HCL 10 MG TABS: 10 | 30 days supply | Qty: 30 | Fill #0

## 2016-12-13 MED FILL — busPIRone HCL 7.5 MG TABS: 7.5 | 30 days supply | Qty: 90 | Fill #0

## 2016-12-13 MED FILL — FLUoxetine HCL 40 MG CAPS: 40 | 90 days supply | Qty: 90 | Fill #0

## 2016-12-13 NOTE — Patient Instructions (Addendum)
F/u week of August 6, call if you need me sooner  You are referred for eye exam Fasting labs needed this week.CBC, lipid, cmp and eGFR, hBA1C, TSH, vit D Please work on good  health habits so that your health will improve. 1. Commitment to daily physical activity for 30 to 60  minutes, if you are able to do this.  2. Commitment to wise food choices. Aim for half of your  food intake to be vegetable and fruit, one quarter starchy foods, and one quarter protein. Try to eat on a regular schedule  3 meals per day, snacking between meals should be limited to vegetables or fruits or small portions of nuts. 64 ounces of water per day is generally recommended, unless you have specific health conditions, like heart failure or kidney failure where you will need to limit fluid intake.  3. Commitment to sufficient and a  good quality of physical and mental rest daily, generally between 6 to 8 hours per day.  WITH PERSISTANCE AND PERSEVERANCE, THE IMPOSSIBLE , BECOMES THE NORM! .  It is important that you exercise regularly at least 30 minutes 5 times a week. If you develop chest pain, have severe difficulty breathing, or feel very tired, stop exercising immediately and seek medical attention

## 2016-12-16 NOTE — Progress Notes (Signed)
Mary Roman     MRN: [id number]      DOB: Jun 26, 1971   HPI Mary Roman is here for follow up and re-evaluation of chronic medical conditions, medication management and review of any available recent lab and radiology data.  Preventive health is updated, specifically  Cancer screening and Immunization.   The PT denies any adverse reactions to current medications since the last visit.  She unfortunately electively discontinued anxiety and depression medications 2 months ago, had increased personal stress of losing support of caring for her mother at the same time and was so "paralyzed " by anxiety and depression 2 days ago, she was unable to go to work She has totally decompensated, however , her thinking is such that she feels that her job, rather than her family, is unable to do without her. Her co worker has 2 days off next week and she promises that should she realize next Thursday that she is incapable of continuing to work she will come in for prolonged mental health leave which she currently does need. Not currently suicidal or homicidal , but states several weeks ago she told her partner that she thought her family would be better off without her, everything is a struggle for her to complete, her level of function is poor currently, she will resume medication and will re establish with therapy  ROS See HPI  Denies recent fever or chills. Denies sinus pressure, nasal congestion, ear pain or sore throat. Denies chest congestion, productive cough or wheezing. Denies chest pains, palpitations and leg swelling Denies abdominal pain, nausea, vomiting,diarrhea or constipation.   Denies dysuria, frequency, hesitancy or incontinence. Denies uncontrolled  joint pain, swelling and limitation in mobility.Pain medication is appropriate, registry is reviewed and 12 weeks of medication is prescribed Denies headaches, seizures, numbness, or tingling.  Scratches on legs due to  anxiety  PE  BP 120/76 (BP Location: Right Arm, Patient Position: Sitting, Cuff Size: Large)   Pulse 76   Ht 5\' 5"  (1.651 m)   Wt 216 lb (98 kg)   SpO2 96%   BMI 35.94 kg/m   Patient alert and oriented and in no cardiopulmonary distress.  HEENT: No facial asymmetry, EOMI,   oropharynx pink and moist.  Neck supple no JVD, no mass.  Chest: Clear to auscultation bilaterally.  CVS: S1, S2 no murmurs, no S3.Regular rate.  ABD: Soft non tender.   Ext: No edema  MS: Adequate though reduced ROM spine, shoulders, hips and knees.  Skin: scratches on both legs due to anxiety  Psych: Good eye contact, tearful affect. Memory intact  Anxious and  depressed appearing.  CNS: CN 2-12 intact, power,  normal throughout.no focal deficits noted.   Assessment & Plan  Essential hypertension Controlled, no change in medication / DASH diet and commitment to daily physical activity for a minimum of 30 minutes discussed and encouraged, as a part of hypertension management. The importance of attaining a healthy weight is also discussed.  BP/Weight 12/13/2016 09/11/2016 07/11/2016 05/09/2016 02/02/2016 08/29/2015 11/23/621  Systolic BP 762 831 517 616 073 710 626  Diastolic BP 76 80 80 88 80 70 78  Wt. (Lbs) 216 227 217 215 214 205 216  BMI 35.94 37.77 36.11 35.78 34.03 32.6 34.88       Generalized anxiety disorder Uncontrolled , resume medication and f/u in 4 weeks  Depression Resume fluoxetine 40 mg daily, and buspar. Return in 1 week if unable to function on the job Resume psychotherapy  F/u in office in 3 to 4 weeks for re evaluation  Encounter for chronic pain management Pain control reviewed and deemed appropriate by patient. [place] registry is reviewed  12 week prescription provided with appropriate follow up  Diabetes mellitus type 2 in obese [facility]) Mary Roman is reminded of the importance of commitment to daily physical activity for 30 minutes or more, as able and the need to  limit carbohydrate intake to 30 to 60 grams per meal to help with blood sugar control.   The need to take medication as prescribed, test blood sugar as directed, and to call between visits if there is a concern that blood sugar is uncontrolled is also discussed.   Mary Roman is reminded of the importance of daily foot exam, annual eye examination, and good blood sugar, blood pressure and cholesterol control. Updated lab needed at/ before next visit.   Diabetic Labs Latest Ref Rng & Units 12/12/2016 09/11/2016 07/11/2016 02/01/2016 09/16/2015  HbA1c <5.7 % - - 6.3(H) 6.4(H) 6.7(H)  Microalbumin Not estab mg/dL 2.7 26.9(H) - - -  Micro/Creat Ratio <30 mcg/mg creat 9 - - - -  Chol 125 - 200 mg/dL - - - - 155  HDL >=46 mg/dL - - - - 37(L)  Calc LDL <130 mg/dL - - - - 104  Triglycerides <150 mg/dL - - - - 69  Creatinine 0.50 - 1.10 mg/dL - - 0.90 0.87 0.90   BP/Weight 12/13/2016 09/11/2016 07/11/2016 05/09/2016 02/02/2016 08/29/2015 4/97/0263  Systolic BP 785 885 027 741 287 867 672  Diastolic BP 76 80 80 88 80 70 78  Wt. (Lbs) 216 227 217 215 214 205 216  BMI 35.94 37.77 36.11 35.78 34.03 32.6 34.88   Foot/eye exam completion dates Latest Ref Rng & Units 02/02/2016 10/03/2015  Eye Exam No Retinopathy - No Retinopathy  Foot Form Completion - Done -

## 2016-12-16 NOTE — Assessment & Plan Note (Signed)
Pain control reviewed and deemed appropriate by patient. [place] registry is reviewed  12 week prescription provided with appropriate follow up

## 2016-12-16 NOTE — Assessment & Plan Note (Addendum)
Mary Roman is reminded of the importance of commitment to daily physical activity for 30 minutes or more, as able and the need to limit carbohydrate intake to 30 to 60 grams per meal to help with blood sugar control.   The need to take medication as prescribed, test blood sugar as directed, and to call between visits if there is a concern that blood sugar is uncontrolled is also discussed.   Mary Roman is reminded of the importance of daily foot exam, annual eye examination, and good blood sugar, blood pressure and cholesterol control. Updated lab needed at/ before next visit.   Diabetic Labs Latest Ref Rng & Units 12/12/2016 09/11/2016 07/11/2016 02/01/2016 09/16/2015  HbA1c <5.7 % - - 6.3(H) 6.4(H) 6.7(H)  Microalbumin Not estab mg/dL 2.7 26.9(H) - - -  Micro/Creat Ratio <30 mcg/mg creat 9 - - - -  Chol 125 - 200 mg/dL - - - - 155  HDL >=46 mg/dL - - - - 37(L)  Calc LDL <130 mg/dL - - - - 104  Triglycerides <150 mg/dL - - - - 69  Creatinine 0.50 - 1.10 mg/dL - - 0.90 0.87 0.90   BP/Weight 12/13/2016 09/11/2016 07/11/2016 05/09/2016 02/02/2016 08/29/2015 9/43/2761  Systolic BP 470 929 574 734 037 096 438  Diastolic BP 76 80 80 88 80 70 78  Wt. (Lbs) 216 227 217 215 214 205 216  BMI 35.94 37.77 36.11 35.78 34.03 32.6 34.88   Foot/eye exam completion dates Latest Ref Rng & Units 02/02/2016 10/03/2015  Eye Exam No Retinopathy - No Retinopathy  Foot Form Completion - Done -

## 2016-12-16 NOTE — Assessment & Plan Note (Signed)
Uncontrolled , resume medication and f/u in 4 weeks

## 2016-12-16 NOTE — Assessment & Plan Note (Signed)
Controlled, no change in medication / DASH diet and commitment to daily physical activity for a minimum of 30 minutes discussed and encouraged, as a part of hypertension management. The importance of attaining a healthy weight is also discussed.  BP/Weight 12/13/2016 09/11/2016 07/11/2016 05/09/2016 02/02/2016 08/29/2015 7/32/2567  Systolic BP 209 198 022 179 810 254 862  Diastolic BP 76 80 80 88 80 70 78  Wt. (Lbs) 216 227 217 215 214 205 216  BMI 35.94 37.77 36.11 35.78 34.03 32.6 34.88

## 2016-12-16 NOTE — Assessment & Plan Note (Signed)
Resume fluoxetine 40 mg daily, and buspar. Return in 1 week if unable to function on the job Resume psychotherapy F/u in office in 3 to 4 weeks for re evaluation

## 2017-01-03 ENCOUNTER — Other Ambulatory Visit: Payer: Self-pay

## 2017-01-03 ENCOUNTER — Telehealth: Payer: Self-pay | Admitting: Family Medicine

## 2017-01-03 MED ORDER — TRIAMTERENE-HCTZ 37.5-25 MG PO TABS: 1.0000 | ORAL_TABLET | Freq: Every day | ORAL | 1 refills | Status: DC

## 2017-01-03 MED FILL — TRIAMTERENE-HCTZ 37.5-25 MG: 37.5-25 | 90 days supply | Qty: 90 | Fill #0

## 2017-01-03 NOTE — Telephone Encounter (Signed)
Patient left voicemail requesting blood pressure medication, says her original script is expired.  Cb#: [phone]

## 2017-01-03 NOTE — Telephone Encounter (Signed)
Med refilled.

## 2017-01-08 ENCOUNTER — Other Ambulatory Visit: Payer: Self-pay | Admitting: Family Medicine

## 2017-01-08 ENCOUNTER — Telehealth: Payer: Self-pay | Admitting: Family Medicine

## 2017-01-08 ENCOUNTER — Encounter: Payer: Self-pay | Admitting: Family Medicine

## 2017-01-08 ENCOUNTER — Ambulatory Visit (INDEPENDENT_AMBULATORY_CARE_PROVIDER_SITE_OTHER): Payer: 59 | Admitting: Family Medicine

## 2017-01-08 ENCOUNTER — Telehealth (HOSPITAL_COMMUNITY): Payer: Self-pay | Admitting: *Deleted

## 2017-01-08 VITALS — BP 108/70 | HR 79 | Resp 16 | Ht 65.0 in | Wt 224.0 lb

## 2017-01-08 DIAGNOSIS — F5104 Psychophysiologic insomnia: Secondary | ICD-10-CM

## 2017-01-08 DIAGNOSIS — I10 Essential (primary) hypertension: Secondary | ICD-10-CM

## 2017-01-08 DIAGNOSIS — F411 Generalized anxiety disorder: Secondary | ICD-10-CM

## 2017-01-08 DIAGNOSIS — F322 Major depressive disorder, single episode, severe without psychotic features: Secondary | ICD-10-CM | POA: Diagnosis not present

## 2017-01-08 DIAGNOSIS — E559 Vitamin D deficiency, unspecified: Secondary | ICD-10-CM | POA: Diagnosis not present

## 2017-01-08 DIAGNOSIS — E119 Type 2 diabetes mellitus without complications: Secondary | ICD-10-CM | POA: Diagnosis not present

## 2017-01-08 DIAGNOSIS — E785 Hyperlipidemia, unspecified: Secondary | ICD-10-CM | POA: Diagnosis not present

## 2017-01-08 DIAGNOSIS — E8881 Metabolic syndrome: Secondary | ICD-10-CM | POA: Diagnosis not present

## 2017-01-08 LAB — CBC WITH DIFFERENTIAL/PLATELET
Basophils Absolute: 66 cells/uL (ref 0–200)
Basophils Relative: 1 %
EOS ABS: 198 {cells}/uL (ref 15–500)
Eosinophils Relative: 3 %
HEMATOCRIT: 34.5 % — AB (ref 35.0–45.0)
Hemoglobin: 10.8 g/dL — ABNORMAL LOW (ref 11.7–15.5)
LYMPHS PCT: 33 %
Lymphs Abs: 2178 cells/uL (ref 850–3900)
MCH: 26.9 pg — ABNORMAL LOW (ref 27.0–33.0)
MCHC: 31.3 g/dL — ABNORMAL LOW (ref 32.0–36.0)
MCV: 85.8 fL (ref 80.0–100.0)
MONO ABS: 396 {cells}/uL (ref 200–950)
MPV: 8.4 fL (ref 7.5–12.5)
Monocytes Relative: 6 %
NEUTROS PCT: 57 %
Neutro Abs: 3762 cells/uL (ref 1500–7800)
Platelets: 515 10*3/uL — ABNORMAL HIGH (ref 140–400)
RBC: 4.02 MIL/uL (ref 3.80–5.10)
RDW: 14.4 % (ref 11.0–15.0)
WBC: 6.6 10*3/uL (ref 3.8–10.8)

## 2017-01-08 LAB — COMPLETE METABOLIC PANEL WITH GFR
ALT: 11 U/L (ref 6–29)
AST: 11 U/L (ref 10–35)
Albumin: 3.6 g/dL (ref 3.6–5.1)
Alkaline Phosphatase: 65 U/L (ref 33–115)
BILIRUBIN TOTAL: 0.4 mg/dL (ref 0.2–1.2)
BUN: 12 mg/dL (ref 7–25)
CHLORIDE: 100 mmol/L (ref 98–110)
CO2: 31 mmol/L (ref 20–31)
Calcium: 9 mg/dL (ref 8.6–10.2)
Creat: 0.88 mg/dL (ref 0.50–1.10)
GFR, EST NON AFRICAN AMERICAN: 78 mL/min (ref >=60)
GFR, Est African American: >89 mL/min (ref >=60)
GLUCOSE: 119 mg/dL — AB (ref 65–99)
Potassium: 3.8 mmol/L (ref 3.5–5.3)
SODIUM: 137 mmol/L (ref 135–146)
TOTAL PROTEIN: 6.9 g/dL (ref 6.1–8.1)

## 2017-01-08 LAB — LIPID PANEL
Cholesterol: 157 mg/dL (ref <200)
HDL: 37 mg/dL — AB (ref >50)
LDL CALC: 107 mg/dL — AB (ref <100)
Total CHOL/HDL Ratio: 4.2 Ratio (ref <5.0)
Triglycerides: 64 mg/dL (ref <150)
VLDL: 13 mg/dL (ref <30)

## 2017-01-08 LAB — VITAMIN D 25 HYDROXY (VIT D DEFICIENCY, FRACTURES): Vit D, 25-Hydroxy: 14 ng/mL — ABNORMAL LOW (ref 30–100)

## 2017-01-08 LAB — TSH: TSH: 0.95 m[IU]/L

## 2017-01-08 NOTE — Assessment & Plan Note (Signed)
Controlled, no change in medication DASH diet and commitment to daily physical activity for a minimum of 30 minutes discussed and encouraged, as a part of hypertension management. The importance of attaining a healthy weight is also discussed.  BP/Weight 01/08/2017 12/13/2016 09/11/2016 07/11/2016 05/09/2016 02/02/2016 09/02/5595  Systolic BP 416 384 536 468 032 122 482  Diastolic BP 70 76 80 80 88 80 70  Wt. (Lbs) 224 216 227 217 215 214 205  BMI 37.28 35.94 37.77 36.11 35.78 34.03 32.6

## 2017-01-08 NOTE — Assessment & Plan Note (Addendum)
High gAD score despite high dose buspar and fluoxetine with poor level of performance and function needs urgent psych eval and management

## 2017-01-08 NOTE — Progress Notes (Signed)
   Mary Roman     MRN: [id number]      DOB: [birth year]   HPI Mary Roman is here for follow up and re-evaluation of uncontrolled depression and anxiety, she has had very little to no symptom improvement on fluoxetine 40 mg with a PHQ 9 score of 20 down from 25. Anxiety score today on buspar 7.5 mg three times daily is 14 Sleep duration is approximately 4 hours though she is still sleeping with the tV on The thought of staying home because of her illness is not appealing as she is still ina "shut down" mode and would just lie on the couch, whereas she is still able to do something on the job. She has not reached out to therapy yet. Very interested in the additional help tha she needs with psychiatry, not suicidal or homicidal, no hallucinations ROS Denies recent fever or chills. Denies sinus pressure, nasal congestion, ear pain or sore throat. Denies chest congestion, productive cough or wheezing. Denies chest pains, palpitations and leg swelling Denies abdominal pain, nausea, vomiting,diarrhea or constipation.   Denies dysuria, frequency, hesitancy or incontinence. Denies joint pain, swelling and limitation in mobility. Denies headaches, seizures, numbness, or tingling.  Denies skin break down or rash.   PE  BP 108/70   Pulse 79   Resp 16   Ht 5\' 5"  (1.651 m)   Wt 224 lb (101.6 kg)   SpO2 99%   BMI 37.28 kg/m   Patient alert and oriented and in no cardiopulmonary distress.  HEENT: No facial asymmetry, EOMI,   oropharynx pink and moist.  Neck supple no JVD, no mass.  Chest: Clear to auscultation bilaterally.  CVS: S1, S2 no murmurs, no S3.Regular rate.  ABD: Soft non tender.   Ext: No edema  MS: Adequate ROM spine, shoulders, hips and knees.  Skin: Intact, no ulcerations or rash noted.  Psych: Good eye contact, flat affect. Memory intact mildly  Anxious andr depressed appearing.  CNS: CN 2-12 intact, power,  normal throughout.no focal deficits  noted.   Assessment & Plan  Depression No significant response to medication, needs psych management asap, not suicidal or homicidal, poor level of function at high dose of fluoxetine  Generalized anxiety disorder High gAD score despite high dose buspar and fluoxetine with poor level of performance and function needs urgent psych eval and management  Insomnia Poor sleep hygiene and is achieving only approx 4 hrs sleep per night Sleep hygiene reviewed, she will turn tV off, increase xanax to TWO at bedtime for next one week, no other med changes, hope that psych will take over management witting the next one week  Essential hypertension Controlled, no change in medication DASH diet and commitment to daily physical activity for a minimum of 30 minutes discussed and encouraged, as a part of hypertension management. The importance of attaining a healthy weight is also discussed.  BP/Weight 01/08/2017 12/13/2016 09/11/2016 07/11/2016 05/09/2016 02/02/2016 11/05/8117  Systolic BP 147 829 562 130 865 784 696  Diastolic BP 70 76 80 80 88 80 70  Wt. (Lbs) 224 216 227 217 215 214 205  BMI 37.28 35.94 37.77 36.11 35.78 34.03 32.6

## 2017-01-08 NOTE — Telephone Encounter (Signed)
phone call to patient, someone answered phone and hung up.

## 2017-01-08 NOTE — Patient Instructions (Signed)
F/u as before  OK to take TWO xanax tablets at bedtime for the next one week in the hoprwe that this will help with sleep   I am referring you urgently to psychiatry, and will call with appt info, hope is that you are seen within the next 7 days  Please give yourself two 15 minute breaks each day, to RELAX and smile and feel calm

## 2017-01-08 NOTE — Assessment & Plan Note (Addendum)
No significant response to medication, needs psych management asap, not suicidal or homicidal, poor level of function at high dose of fluoxetine

## 2017-01-08 NOTE — Assessment & Plan Note (Addendum)
Poor sleep hygiene and is achieving only approx 4 hrs sleep per night Sleep hygiene reviewed, she will turn tV off, increase xanax to TWO at bedtime for next one week, no other med changes, hope that psych will take over management witting the next one week

## 2017-01-08 NOTE — Telephone Encounter (Signed)
Left msg at behavioral health with patients MRN; requesting an urgent appt per Dr.Simpson

## 2017-01-09 ENCOUNTER — Ambulatory Visit: Payer: 59 | Admitting: Family Medicine

## 2017-01-09 LAB — HEMOGLOBIN A1C
Hgb A1c MFr Bld: 7.1 % — ABNORMAL HIGH (ref <5.7)
MEAN PLASMA GLUCOSE: 157 mg/dL

## 2017-01-10 ENCOUNTER — Other Ambulatory Visit: Payer: Self-pay | Admitting: Family Medicine

## 2017-01-10 ENCOUNTER — Encounter: Payer: Self-pay | Admitting: Family Medicine

## 2017-01-10 MED ORDER — ERGOCALCIFEROL 1.25 MG (50000 UT) PO CAPS: 50000.0000 [IU] | ORAL_CAPSULE | ORAL | 1 refills | Status: DC
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
## 2017-01-12 LAB — IRON,TIBC AND FERRITIN PANEL
%SAT: 11 % (ref 11–50)
Ferritin: 17 ng/mL (ref 10–232)
IRON: 39 ug/dL — AB (ref 40–190)
TIBC: 370 ug/dL (ref 250–450)

## 2017-01-12 LAB — B12 AND FOLATE PANEL
FOLATE: 5.3 ng/mL — AB (ref >5.4)
VITAMIN B 12: 247 pg/mL (ref 200–1100)

## 2017-01-17 ENCOUNTER — Ambulatory Visit (INDEPENDENT_AMBULATORY_CARE_PROVIDER_SITE_OTHER): Payer: 59 | Admitting: Psychiatry

## 2017-01-17 ENCOUNTER — Encounter (HOSPITAL_COMMUNITY): Payer: Self-pay | Admitting: Psychiatry

## 2017-01-17 VITALS — BP 105/68 | HR 58 | Ht 65.0 in | Wt 219.0 lb

## 2017-01-17 DIAGNOSIS — I10 Essential (primary) hypertension: Secondary | ICD-10-CM

## 2017-01-17 DIAGNOSIS — F419 Anxiety disorder, unspecified: Secondary | ICD-10-CM

## 2017-01-17 DIAGNOSIS — Z811 Family history of alcohol abuse and dependence: Secondary | ICD-10-CM

## 2017-01-17 DIAGNOSIS — F331 Major depressive disorder, recurrent, moderate: Secondary | ICD-10-CM | POA: Diagnosis not present

## 2017-01-17 DIAGNOSIS — Z818 Family history of other mental and behavioral disorders: Secondary | ICD-10-CM

## 2017-01-17 DIAGNOSIS — M549 Dorsalgia, unspecified: Secondary | ICD-10-CM | POA: Diagnosis not present

## 2017-01-17 MED ORDER — TRAZODONE HCL 50 MG PO TABS: 50.0000 mg | ORAL_TABLET | Freq: Every day | ORAL | 0 refills | Status: DC

## 2017-01-17 MED ORDER — DULOXETINE HCL 30 MG PO CPEP: ORAL_CAPSULE | ORAL | 0 refills | Status: DC

## 2017-01-17 NOTE — Patient Instructions (Addendum)
1. Start duloxetine 30 mg daily for one week, then 60 mg daily 2. Discontinue fluoxetine  3. Discontinue Xanax 4. Start Trazodone 50 mg at night as needed for sleep 5. Continue Temazepam 30 mg at night as needed for sleep 6. Continue buspar three times per day 7. Return to clinic in one month for 30 mins 8. Referral to therapy

## 2017-01-18 DIAGNOSIS — H5213 Myopia, bilateral: Secondary | ICD-10-CM | POA: Diagnosis not present

## 2017-01-18 DIAGNOSIS — H524 Presbyopia: Secondary | ICD-10-CM | POA: Diagnosis not present

## 2017-01-18 DIAGNOSIS — H52223 Regular astigmatism, bilateral: Secondary | ICD-10-CM | POA: Diagnosis not present

## 2017-01-18 DIAGNOSIS — Z7984 Long term (current) use of oral hypoglycemic drugs: Secondary | ICD-10-CM | POA: Diagnosis not present

## 2017-01-18 LAB — HM DIABETES EYE EXAM

## 2017-01-20 ENCOUNTER — Encounter: Payer: Self-pay | Admitting: Family Medicine

## 2017-01-29 MED FILL — traZODone HCL 50 MG TABS: 50 | 90 days supply | Qty: 90 | Fill #0

## 2017-01-29 MED FILL — busPIRone HCL 7.5 MG TABS: 7.5 | 10 days supply | Qty: 30 | Fill #1

## 2017-01-31 ENCOUNTER — Ambulatory Visit (INDEPENDENT_AMBULATORY_CARE_PROVIDER_SITE_OTHER): Payer: 59 | Admitting: Psychiatry

## 2017-01-31 ENCOUNTER — Encounter (HOSPITAL_COMMUNITY): Payer: Self-pay | Admitting: Psychiatry

## 2017-01-31 DIAGNOSIS — F331 Major depressive disorder, recurrent, moderate: Secondary | ICD-10-CM | POA: Diagnosis not present

## 2017-01-31 NOTE — Progress Notes (Signed)
Comprehensive Clinical Assessment (CCA) Note  01/31/2017 Mary Roman [id number]  Visit Diagnosis:      ICD-10-CM   1. Major depressive disorder, recurrent episode, moderate (HCC) F33.1       CCA Part One  Part One has been completed on paper by the patient.  (See scanned document in Chart Review)  CCA Part Two A  Intake/Chief Complaint:  CCA Intake With Chief Complaint CCA Part Two Date: 01/31/17 CCA Part Two Time: 0834 Chief Complaint/Presenting Problem: Just the anxiety and depression, trying to cope with every day stress. I have problems with finances and occasionally work. Most of myp stress is feeling like there is not enough time in the day to do what I need to do. 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 I have two children and a fiancee.  I an  the sole bread winner and my fiancee works part time. . Patients Currently Reported Symptoms/Problems: withdrawal, difficulty getting out of bed, have phases I can't get out of bed, I have to really push myself, zero energy, poor motivation,loss of interest, extreme fluctuations in appetite, excessive sleeping Individual's Strengths: hard worker, articualte Individual's Preferences: learn how to deal with everyday stressors so I can function well enough with my family and children, and I can feel better about self. Type of Services Patient Feels Are Needed: individual therapy Initial Clinical Notes/Concerns: Patient is referred for services by psychiatrist Dr. Modesta Messing. Patient reports experiencing symptoms of depression for the past 5 years. She reports participating in therapy twice with [facility] counselor about 2 years ago. She reports no psychiatric hospitalizatiions. She began taking psychotropic mediciation about 3 years ago as prescribed by PCP.   Mental Health Symptoms Depression:  Depression: Change in energy/activity, Difficulty Concentrating, Fatigue, Hopelessness, Increase/decrease in appetite, Irritability, Sleep (too much or  little), Tearfulness, Weight gain/loss, Worthlessness  Mania:  Mania: N/A  Anxiety:   Anxiety: Difficulty concentrating, Fatigue, Irritability, Restlessness, Sleep, Tension, Worrying  Psychosis:  Psychosis: N/A  Trauma:  N/A  Obsessions:  Obsessions: N/A  Compulsions:  Compulsions: N/A  Inattention:  Inattention: N/A  Hyperactivity/Impulsivity:  Hyperactivity/Impulsivity: N/A  Oppositional/Defiant Behaviors:  Oppositional/Defiant Behaviors: N/A  Borderline Personality:  Emotional Irregularity: N/A  Other Mood/Personality Symptoms:  N/A   Mental Status Exam Appearance and self-care  Stature:  Stature: Average  Weight:  Weight: Overweight  Clothing:  Clothing: Neat/clean  Grooming:  Grooming: Normal  Cosmetic use:  Cosmetic Use: None  Posture/gait:  Posture/Gait: Normal  Motor activity:  Motor Activity: Not Remarkable  Sensorium  Attention:  Attention: Normal  Concentration:  Concentration: Normal  Orientation:  Orientation: X5  Recall/memory:  Recall/Memory: Normal  Affect and Mood  Affect:  Affect: Depressed  Mood:  Mood: Depressed, Anxious  Relating  Eye contact:  Eye Contact: Normal  Facial expression:  Facial Expression: Responsive  Attitude toward examiner:  Attitude Toward Examiner: Cooperative  Thought and Language  Speech flow: Speech Flow: Soft  Thought content:  Thought Content: Appropriate to mood and circumstances  Preoccupation:  Preoccupations: Ruminations  Hallucinations:  Hallucinations: Other (Comment) (None)  Organization:     Transport planner of Knowledge:  Fund of Knowledge: Average  Intelligence:  Intelligence: Average  Abstraction:  Abstraction: Normal  Judgement:  Judgement: Normal  Reality Testing:  Reality Testing: Realistic  Insight:  Insight: Good  Decision Making:  Decision Making: Normal  Social Functioning  Social Maturity:  Social Maturity: Isolates  Social Judgement:  Social Judgement: Normal  Stress  Stressors:  Stressors:  Work, Chiropodist, Transitions, Family conflict  Coping Ability:  Coping Ability: Overwhelmed, Research officer, political party Deficits:    Supports:     Family and Psychosocial History: Family history Marital status: Long term relationship Long term relationship, how long?: 13 years What types of issues is patient dealing with in the relationship?: get along good, fiancee struggles with the issue of taking care of patient's mother, his ambition could be better regarding working Are you sexually active?: Yes What is your sexual orientation?: heterosexual Has your sexual activity been affected by drugs, alcohol, medication, or emotional stress?: emotional stress Does patient have children?: Yes How many children?: 2 How is patient's relationship with their children?: close with 28 yo daughter and 21 yo son.   Childhood History:  Childhood History By whom was/is the patient raised?: Mother (Parents separated when patient was an infant and father died when she was really young. ) Additional childhood history information: Patient was born and raised in [place].  Description of patient's relationship with caregiver when they were a child: I am the youngest of 5 children. It was [facility]  but a little stressful when I was a preteen because mother had alcohol issuese, we moved around a lot. I ended up having to stay with sister at age 18 as mother moved back to [place]  Patient's description of current relationship with people who raised him/her: It is good, I am part time caretaker as she had a stroke 6 years ago.  How were you disciplined when you got in trouble as a child/adolescent?: yelling, some spankings Does patient have siblings?: Yes Number of Siblings: 5 Description of patient's current relationship with siblings: one is deceased, really close with one sister, no relationship with my other sister, tolerable relationship with brothers Did patient suffer any verbal/emotional/physical/sexual abuse as a child?: No Did  patient suffer from severe childhood neglect?: No Has patient ever been sexually abused/assaulted/raped as an adolescent or adult?: No Was the patient ever a victim of a crime or a disaster?: No Witnessed domestic violence?: No Has patient been effected by domestic violence as an adult?: No  CCA Part Two B  Employment/Work Situation: Employment / Work Copywriter, advertising Employment situation: Employed 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 How long has patient been employed?: 7 years  Patient's job has been impacted by current illness: No What is the longest time patient has a held a job?: 13 years 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 Has patient ever been in the [employer]?: No Are There Guns or Other Weapons in [place]?: No  Education: Education Did Teacher, adult education From [employer]?: Yes Did Physicist, medical?: Yes ([employer] in [place], Geophysicist/field seismologist) Did You Have Any Chief Technology Officer In School?: [place] Did You Have An Individualized Education Program (IIEP): No  Religion: Religion/Spirituality Are You A Religious Person?: Yes What is Your Religious Affiliation?: Baptist How Might This Affect Treatment?: no effect  Leisure/Recreation: Leisure / Recreation Leisure and Hobbies: like shopping, crafts, yard Press photographer,   Exercise/Diet: Exercise/Diet Do You Exercise?: No Have You Gained or Lost A Significant Amount of Weight in the Past Six Months?: Yes-Gained Number of Pounds Gained: 15 Do You Follow a Special Diet?: No Do You Have Any Trouble Sleeping?: Yes Explanation of Sleeping Difficulties: excessive sleepipng   CCA Part Two C  Alcohol/Drug Use: Alcohol / Drug Use Pain Medications: See patient record Prescriptions: See patient record Over the Counter: See  patient record History of alcohol / drug use?: No history of alcohol / drug abuse  CCA Part Three  ASAM's:  Six Dimensions of  Multidimensional Assessment  N/A  Substance use Disorder (SUD) N/A    Social Function:  Social Functioning Social Maturity: Isolates Social Judgement: Normal  Stress:  Stress Stressors: Work, Chiropodist, Transitions, Family conflict Coping Ability: Overwhelmed, Exhausted Patient Takes Medications The Way The Doctor Instructed?: Yes Priority Risk: Moderate Risk  Risk Assessment- Self-Harm Potential: Risk Assessment For Self-Harm Potential Thoughts of Self-Harm: No current thoughts Method: No plan Availability of Means: No access/NA Additional Information for Self-Harm Potential: Family History of Suicide  Risk Assessment -Dangerous to Others Potential: Risk Assessment For Dangerous to Others Potential Method: No Plan Availability of Means: No access or NA Intent: Vague intent or NA Notification Required: No need or identified person  DSM5 Diagnoses: Patient Active Problem List   Diagnosis Date Noted  . Major depressive disorder, recurrent episode, moderate ([place]) 01/17/2017  . Encounter for chronic pain management 09/14/2016  . Sleep-disordered breathing 02/02/2016  . Metabolic syndrome X 07/18/7587  . Unspecified vitamin D deficiency 02/19/2013  . Depression 04/28/2012  . Insomnia 08/27/2011  . Diabetes mellitus type 2 in obese ([place]) 08/27/2011  . Dyslipidemia (high LDL; low HDL) 04/23/2011  . Generalized anxiety disorder 09/13/2010  . HEADACHE 09/13/2010  . Obesity (BMI 30.0-34.9) 12/24/2007  . Essential hypertension 12/24/2007  . Backache 12/24/2007    Patient Centered Plan: Patient is on the following Treatment Plan(s):    Recommendations for Services/Supports/Treatments: Recommendations for Services/Supports/Treatments Recommendations For Services/Supports/Treatments: Individual Therapy the patient attends the assessment appointment today. Confidentiality and limits are discussed. The patient agrees return for an appointment in 2 weeks for continuing assessment and  treatment planning. She also agrees to call this practice, call 911, or have someone take her to the emergency room should symptoms worsen. She will continue to see psychiatrist Dr. Modesta Messing for medication management  individual therapy is recommended 1 time every 1-2 weeks to alleviate symptoms of depression and resume previous level of functioning.  Treatment Plan Summary:    Referrals to Alternative Service(s): Referred to Alternative Service(s):   Place:   Date:   Time:    Referred to Alternative Service(s):   Place:   Date:   Time:    Referred to Alternative Service(s):   Place:   Date:   Time:    Referred to Alternative Service(s):   Place:   Date:   Time:     Mary Roman

## 2017-02-06 NOTE — Progress Notes (Signed)
BH MD/PA/NP OP Progress Note  02/11/2017 9:05 AM Mary Roman  MRN:  [id number]  Chief Complaint:  Chief Complaint    Depression; Follow-up     Subjective:  "I feel guilty" HPI:  Patient presents for follow up appointment. She talks about her fiance, who was admitted to ICU for diabetes. She feels that it changed her relationship and she feels more open to him. She talks about ongoing familial discordance. She talks about "negativity" of her siblings who blames her the way she takes care of her mother. Her mother is not willing to move out of the place with concern for patient brother, although he is claimed to be independent. She feels "guilty" that she is not able to take care of her mother as she supposed to.   She reports insomnia, and takes temazepam almost every night. She feels less fatigue since starting medication.She has anhedonia. She denies SI, HI, Ah/VH. She feels anxious. She denies panic attacks. She felt nausea when she first started to take duloxetine. She could not tolerate trazodone as it made her "weird" and it did not help for insomnia.   Per [facility] Patient is prescribed on hydrocodone. Xanax refilled on 12/13/2016 for 30 days. No refill left.   Visit Diagnosis:    ICD-10-CM   1. Major depressive disorder, recurrent episode, moderate (HCC) F33.1     Past Psychiatric History:  I have reviewed the patient's psychiatry history in detail and updated the patient record.  Outpatient: couple therapist in 2015 Psychiatry admission: denies Previous suicide attempt: denies Past trials of medication: fluoxetine, buspirone, Trazodone ("weird" feeling) temazepam, Xanax History of violence: denies  Past Medical History:  Past Medical History:  Diagnosis Date  . Anxiety   . Chest tightness, discomfort, or pressure 05/2010   Emergency Department   . Chronic back pain    with disc disease   . Diabetes mellitus, type II ([place])   . Headache(784.0)   .  Hypertension   . Obesity   . Palpitation   . Thyromegaly    TSH-0.8 and 07/2010    Past Surgical History:  Procedure Laterality Date  . None      Family Psychiatric History:  I have reviewed the patient's family history in detail and updated the patient record. Mother- alcohol use, two siblings- alcohol use, sister- anxiety,   Family History:  Family History  Problem Relation Age of Onset  . Diabetes Mother   . Hypertension Mother   . Stroke Mother   . Colon cancer Mother   . Cancer Mother 58       colon localized  . Pneumonia Father   . Hypertension Sister   . Anxiety disorder Sister   . Hypertension Sister   . Leukemia Sister   . Diabetes Maternal Grandmother   . Hypertension Maternal Grandmother   . Hypertension Maternal Grandfather   . Heart attack Maternal Uncle     Social History:  Social History   Social History  . Marital status: Single    Spouse name: N/A  . Number of children: 2  . Years of education: N/A   Occupational History  . employed in medical office     Social History Main Topics  . Smoking status: Never Smoker  . Smokeless tobacco: Never Used  . Alcohol use No     Comment: 01-17-2017 per pt no  . Drug use: No     Comment: 01-17-2017 per pt no  . Sexual activity: Yes  Birth control/ protection: Other-see comments     Comment: bf had vasectomy   Other Topics Concern  . None   Social History Narrative  . None    Allergies: No Known Allergies  Metabolic Disorder Labs: Lab Results  Component Value Date   HGBA1C 7.1 (H) 01/08/2017   MPG 157 01/08/2017   MPG 134 07/11/2016   No results found for: PROLACTIN Lab Results  Component Value Date   CHOL 157 01/08/2017   TRIG 64 01/08/2017   HDL 37 (L) 01/08/2017   CHOLHDL 4.2 01/08/2017   VLDL 13 01/08/2017   LDLCALC 107 (H) 01/08/2017   LDLCALC 104 09/16/2015     Current Medications: Current Outpatient Prescriptions  Medication Sig Dispense Refill  . Cyanocobalamin (VITAMIN  B-12 PO) Take by mouth.    . DULoxetine (CYMBALTA) 60 MG capsule Take 1 capsule (60 mg total) by mouth daily. 30 capsule 1  . ergocalciferol (VITAMIN D2) 50000 units capsule Take 1 capsule (50,000 Units total) by mouth once a week. One capsule once weekly 12 capsule 1  . ferrous sulfate 325 (65 FE) MG EC tablet Take 325 mg by mouth 3 (three) times daily with meals.    . gabapentin (NEURONTIN) 100 MG capsule Take 1 capsule (100 mg total) by mouth 3 (three) times daily. 90 capsule 1  . HYDROcodone-acetaminophen (NORCO/VICODIN) 5-325 MG tablet One tablet once daily 30 tablet 0  . hydrOXYzine (ATARAX/VISTARIL) 10 MG tablet Take 1 tablet (10 mg total) by mouth at bedtime as needed. 30 tablet 1  . metFORMIN (GLUCOPHAGE-XR) 500 MG 24 hr tablet Take 1 tablet (500 mg total) by mouth daily with breakfast. 90 tablet 3  . metoprolol tartrate (LOPRESSOR) 25 MG tablet TAKE 1 TABLET BY MOUTH  DAILY. 90 tablet 1  . temazepam (RESTORIL) 30 MG capsule TAKE 1 CAPSULE BY MOUTH ONCE DAILY AT BEDTIME AS NEEDED FOR SLEEP 90 capsule 1  . triamterene-hydrochlorothiazide (MAXZIDE-25) 37.5-25 MG tablet Take 1 tablet by mouth daily. 90 tablet 1   No current facility-administered medications for this visit.     Neurologic: Headache: No Seizure: No Paresthesias: No  Musculoskeletal: Strength & Muscle Tone: within normal limits Gait & Station: normal Patient leans: N/A  Psychiatric Specialty Exam: Review of Systems  Musculoskeletal: Positive for back pain.  Psychiatric/Behavioral: Positive for depression. Negative for hallucinations, substance abuse and suicidal ideas. The patient is nervous/anxious and has insomnia.   All other systems reviewed and are negative.   Blood pressure 100/80, pulse 75, height 5\' 5"  (1.651 m), weight 220 lb 3.2 oz (99.9 kg), SpO2 93 %.Body mass index is 36.64 kg/m.  General Appearance: Fairly Groomed  Eye Contact:  Good  Speech:  Clear and Coherent  Volume:  Normal  Mood:  sad   Affect:  down, improving  Thought Process:  Coherent and Goal Directed  Orientation:  Full (Time, Place, and Person)  Thought Content: Logical Perceptions: denies AH/VH  Suicidal Thoughts:  No  Homicidal Thoughts:  No  Memory:  Immediate;   Good Recent;   Good Remote;   Good  Judgement:  Good  Insight:  Fair  Psychomotor Activity:  Normal  Concentration:  Concentration: Good and Attention Span: Good  Recall:  Good  Fund of Knowledge: Good  Language: Good  Akathisia:  No  Handed:  Right  AIMS (if indicated):  N/A  Assets:  Communication Skills Desire for Improvement  ADL's:  Intact  Cognition: WNL  Sleep:  poor   Assessment Ethelene Hal Legacy  is a 46 y.o. year old female with a history of depression, anxiety, essential hypertension, chronic back pain, who presents for follow up appointment for Major depressive disorder, recurrent episode, moderate ([place])  # MDD, moderate, recurrent without psychotic features # r/o GAD Patient reports improvement in her neurovegetative symptoms since switching from fluoxetine to duloxetine. Will uptitrate the dose to optimize its effect for depression. Will discontinue buspar given its limited effect for anxiety. Will continue temazepam for insomnia (patient experienced side effect from trazodone). Patient reports stress in familial discordance while taking care of her mother with stroke. Discussed in length regarding self compassion, healthy boundary. She is encouraged to continue to see a therapist.   Plan 1. Increase duloxetine 60 mg daily 2. Discontinue buspar, Trazodone 3. Continue Temazepam 30 mg at night as needed for sleep 4. Return to clinic in one month for 30 mins (patient is on gabapentin for pain and hydroxyzine prn for allergy)  The patient demonstrates the following risk factors for suicide: Chronic risk factors for suicide include: psychiatric disorder of depression, anxiety and chronic pain. Acute risk factors for suicide  include: N/A. Protective factors for this patient include: positive social support, responsibility to others (children, family), coping skills and hope for the future. Considering these factors, the overall suicide risk at this point appears to be low. Patient is appropriate for outpatient follow up.  Treatment Plan Summary:Plan as above  The duration of this appointment visit was 30 minutes of face-to-face time with the patient.  Greater than 50% of this time was spent in counseling, explanation of  diagnosis, planning of further management, and coordination of care.  [name], MD 02/11/2017, 9:05 AM

## 2017-02-11 ENCOUNTER — Ambulatory Visit (INDEPENDENT_AMBULATORY_CARE_PROVIDER_SITE_OTHER): Payer: 59 | Admitting: Psychiatry

## 2017-02-11 ENCOUNTER — Encounter (HOSPITAL_COMMUNITY): Payer: Self-pay | Admitting: Psychiatry

## 2017-02-11 VITALS — BP 100/80 | HR 75 | Ht 65.0 in | Wt 220.2 lb

## 2017-02-11 DIAGNOSIS — G4700 Insomnia, unspecified: Secondary | ICD-10-CM | POA: Diagnosis not present

## 2017-02-11 DIAGNOSIS — Z811 Family history of alcohol abuse and dependence: Secondary | ICD-10-CM | POA: Diagnosis not present

## 2017-02-11 DIAGNOSIS — Z818 Family history of other mental and behavioral disorders: Secondary | ICD-10-CM

## 2017-02-11 DIAGNOSIS — F331 Major depressive disorder, recurrent, moderate: Secondary | ICD-10-CM | POA: Diagnosis not present

## 2017-02-11 MED ORDER — DULOXETINE HCL 60 MG PO CPEP: 60.0000 mg | ORAL_CAPSULE | Freq: Every day | ORAL | 1 refills | Status: DC

## 2017-02-11 NOTE — Patient Instructions (Signed)
1. Increase duloxetine 60 mg daily 2. Discontinue buspar, Trazodone 3. Continue Temazepam 30 mg at night as needed for sleep 4. Return to clinic in one month for 30 mins

## 2017-02-14 ENCOUNTER — Ambulatory Visit (HOSPITAL_COMMUNITY): Payer: Self-pay | Admitting: Psychiatry

## 2017-02-14 ENCOUNTER — Ambulatory Visit (INDEPENDENT_AMBULATORY_CARE_PROVIDER_SITE_OTHER): Payer: 59 | Admitting: Psychiatry

## 2017-02-14 ENCOUNTER — Encounter (HOSPITAL_COMMUNITY): Payer: Self-pay | Admitting: Psychiatry

## 2017-02-14 DIAGNOSIS — F331 Major depressive disorder, recurrent, moderate: Secondary | ICD-10-CM

## 2017-02-14 NOTE — Progress Notes (Signed)
Patient:  Mary Roman   DOB: 17-Dec-1970  MR Number: [id number]  Location: [place]:  [address] [place],  [place], [address]  Start: Thursday 02/14/2017 8:15 AM  End: Thursday 02/14/2017 9:10 AM   Provider/Observer:     Maurice Small, MSW, LCSW   Chief Complaint:      Chief Complaint  Patient presents with  . Anxiety  . Depression                Reason For Service:     Mary Roman is a 46 y.o. female who is referred for services by psychiatrist Dr. Modesta Messing. Patient reports trying to cope with anxiety, depression, and everyday stress. She states having problems with finances and occasionally work. She says most of her stress is related to not having enough time in the day to do what she needs to do. 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 Patient has 2 children  and a fiance. She is the sole breadwinner for her home. She states having difficulty getting out of bed in the mornings, having to really push herself, having no energy, poor motivation, diminished interest in activities, and sleeping excessively.      Interventions Strategy:  Supportive  Participation Level:   Active  Participation Quality:  Appropriate      Behavioral Observation:  Casual, Alert, and Appropriate and Tearful.   Current Psychosocial Factors: Caretaker responsibilities for mother who had a stroke 5 years ago, financial stress, discord with siblings, job stress  Content of Session:   Established rapport, reviewed symptoms, administered depression screen. PHQ-9, gathered more information regarding stressors, facilitated expression of thoughts and feelings regarding stressors, examined patient's pattern of interactions in her various relationships, began to provide psychoeducation regarding depression and the role of self-care in managing depression, introduced and provided instructions regarding completing self-care assessment, a signed patient to  complete and bring to next session, assisted patient identify ways to begin to improve self-care regarding sleep hygiene  Current Status:   Low energy depressed mood, poor concentration, feelings of hopelessness, irritability, changes in appetite, sleep difficulty, feelings of worthlessness, tearfulness, anxiety, muscle tension, excessive worrying  Suicidal/Homicidal:   No  Patient Progress:   Fair. Patient reports little to no changes in symptoms since assessment session. She continues to have multiple stressors. She expresses increased and frustration and resentment regarding situation with her mother. Per patient's report, she and one of her sisters provide most of the care and support for mother while her remaining siblings tend to be negative but do not contribute much to mother's care. She also expresses frustration with mother as mother perpetuates siblings behavior per patient's report. Patient reports difficulty being assertive and a pattern of having difficulty sitting and maintain boundaries in the relationship with her mother as well as in various other relationships.  Target Goals:   1. Establish rapport. 2. Learn and implement behavioral strategies to overcome depression/improve self-care  Last Reviewed:    Goals Addressed Today:    1,2  Plan:      Return again in 2  weeks.  Impression/Diagnosis:  Patient presents with a history of symptoms of depression that initially began about 5 years ago after her mother had a stroke. Symptoms have waxed and waned since that time. She and one of her siblings assumed most of the caretaker responsibilities for her mother. Symptoms worsened in the past couple of months as her sibling who had  helped her with mother moved to [place] and no longer is as available to assist patient with their mother's care. Patient also has multiple other stressors. Her current symptoms include low energy depressed mood, poor concentration, feelings of hopelessness,  irritability, changes in appetite, sleep difficulty, feelings of worthlessness, tearfulness, anxiety, muscle tension, excessive worrying    Diagnosis:  Axis I: Major depressive disorder, recurrent episode, moderate ([place])          Axis II: Deferred  BYNUM,PEGGY, LCSW 02/14/2017

## 2017-02-28 ENCOUNTER — Ambulatory Visit (INDEPENDENT_AMBULATORY_CARE_PROVIDER_SITE_OTHER): Payer: 59 | Admitting: Psychiatry

## 2017-02-28 ENCOUNTER — Encounter (HOSPITAL_COMMUNITY): Payer: Self-pay | Admitting: Psychiatry

## 2017-02-28 DIAGNOSIS — F331 Major depressive disorder, recurrent, moderate: Secondary | ICD-10-CM | POA: Diagnosis not present

## 2017-02-28 NOTE — Progress Notes (Signed)
Patient:  Mary Roman   DOB: 1971-04-13  MR Number: [id number]    Location: [place]:  [address] [place],  [place], [address]  Start: Thursday 02/28/2017 8:10 AM  End: Thursday 02/28/2017 9:15 AM    Provider/Observer:     Maurice Small, MSW, LCSW   Chief Complaint:           Depression/anxiety              Reason For Service:     Mary Roman is a 46 y.o. female who is referred for services by psychiatrist Dr. Modesta Messing. Patient reports trying to cope with anxiety, depression, and everyday stress. She states having problems with finances and occasionally work. She says most of her stress is related to not having enough time in the day to do what she needs to do. 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 Patient has 2 children  and a fiance. She is the sole breadwinner for her home. She states having difficulty getting out of bed in the mornings, having to really push herself, having no energy, poor motivation, diminished interest in activities, and sleeping excessively.      Interventions Strategy:  Supportive  Participation Level:   Active  Participation Quality:  Appropriate      Behavioral Observation:  Casual, Alert, and Appropriate and Tearful.   Current Psychosocial Factors: Caretaker responsibilities for mother who had a stroke 5 years ago, financial stress, discord with siblings, job stress  Content of Session:   reviewed symptoms, assisted patient identify triggers of increased stress and anxiety, facilitated expression of thoughts and feelings regarding stressors, reviewed information from self-care assessment, assisted patient begin to prioritize areas she would like to pursue which include exercise and eating regimen, discussed patient's strengths and supports, assisted patient identify ways to use her strengthin  spirituality as a coping tool in developing coping statements,  developed treatment plan  Current  Status:   Low energy depressed mood, poor concentration, feelings of hopelessness, irritability, changes in appetite, sleep difficulty, feelings of worthlessness, tearfulness, anxiety, muscle tension, excessive worrying  Suicidal/Homicidal:   No  Patient Progress:   Fair. Patient reports increased difficulty concentrating, anxiety and worry since last session. She reports no improvement in other symptoms of depression and anxiety. She expresses guilt as she worries about the effects of her current functioning on her relationship with her children as she reports she does not feel like doing anything when she gets off work. She reports continued relationship stress with fiance. She expresses sadness as a very close and supportive friend who also is a Physicist, medical church member stopped attending the church patient attends about a month ago. She reports increased anxety as the school start date approaches as she worries about purchasing items for her children. She reports continued "what if" statements.    Target Goals:   1. Learn and implement calming strategies to reduce/manage overall anxiety.    2. Learn and implement behavioral strategies to overcome depression.    3.Verbalize an understanding and resolution of current interpersonal problems.    4. Identify and replace thoughts and beliefs that support depression/anxiety.  Last Reviewed:   02/28/2017  Goals Addressed Today:    1,2,3,4  Plan:      Return again in 2  weeks.  Impression/Diagnosis:  Patient presents with a history of symptoms of depression that initially began about 5 years ago after her mother had a stroke.  Symptoms have waxed and waned since that time. She and one of her siblings assumed most of the caretaker responsibilities for her mother. Symptoms worsened in the past couple of months as her sibling who had helped her with mother moved to [place] and no longer is as available to assist patient with their mother's care. Patient also  has multiple other stressors. Her current symptoms include low energy depressed mood, poor concentration, feelings of hopelessness, irritability, changes in appetite, sleep difficulty, feelings of worthlessness, tearfulness, anxiety, muscle tension, excessive worrying    Diagnosis:  Axis I: Major depressive disorder, recurrent, moderate          Axis II: Deferred  BYNUM,PEGGY, LCSW 02/28/2017

## 2017-03-04 ENCOUNTER — Ambulatory Visit: Payer: 59 | Admitting: Family Medicine

## 2017-03-11 ENCOUNTER — Ambulatory Visit: Payer: 59 | Admitting: Family Medicine

## 2017-03-11 NOTE — Progress Notes (Addendum)
BH MD/PA/NP OP Progress Note  03/14/2017 8:42 AM Mary Roman  MRN:  [id number]  Chief Complaint:  Chief Complaint    Depression; Follow-up     Subjective:  "It's like the light bulb goes out" HPI:  Patient presents for follow up appointment for depression. She states that she has been feeling very tired. She was neatly to force herself to go to work. She went to a beach with her fiance, two children and her sister. Although she enjoyed it some, she was anxious thinking about work and her mother. She feels hopeless, guilty and wishes that her children do not see her this way. She tends to feel overwhelmed, seeing unfinished crafts are in the room. She has no motivation to help at the church anymore, although she used to enjoy it. She denies SI, HI, AH/VH. She seeps 12 hours and feels tired. She has poor appetite. She denies panic attacks.   Per [employer] She is on hyrocodone.  Xanax last prescribed for 30 days in 12/13/2016 Temazepam prescribed for 90 days in 10/02/2016, one refill left   Visit Diagnosis:    ICD-10-CM   1. Major depressive disorder, recurrent episode, moderate (HCC) F33.1     Past Psychiatric History:  I have reviewed the patient's psychiatry history in detail and updated the patient record. Outpatient: couple therapist in 2015 Psychiatry admission: denies Previous suicide attempt: denies Past trials of medication: fluoxetine, buspirone, Trazodone ("weird" feeling) temazepam, Xanax History of violence: denies  Past Medical History:  Past Medical History:  Diagnosis Date  . Anxiety   . Chest tightness, discomfort, or pressure 05/2010   Emergency Department   . Chronic back pain    with disc disease   . Diabetes mellitus, type II ([place])   . Headache(784.0)   . Hypertension   . Obesity   . Palpitation   . Thyromegaly    TSH-0.8 and 07/2010    Past Surgical History:  Procedure Laterality Date  . None      Family Psychiatric History:  I have  reviewed the patient's family history in detail and updated the patient record.  Family History:  Family History  Problem Relation Age of Onset  . Diabetes Mother   . Hypertension Mother   . Stroke Mother   . Colon cancer Mother   . Cancer Mother 14       colon localized  . Alcohol abuse Mother   . Pneumonia Father   . Hypertension Sister   . Anxiety disorder Sister   . Hypertension Sister   . Leukemia Sister   . Diabetes Maternal Grandmother   . Hypertension Maternal Grandmother   . Hypertension Maternal Grandfather   . Heart attack Maternal Uncle     Social History:  Social History   Social History  . Marital status: Single    Spouse name: N/A  . Number of children: 2  . Years of education: N/A   Occupational History  . employed in medical office     Social History Main Topics  . Smoking status: Never Smoker  . Smokeless tobacco: Never Used  . Alcohol use No     Comment: 01-17-2017 per pt no  . Drug use: No     Comment: 01-17-2017 per pt no  . Sexual activity: Yes    Birth control/ protection: Other-see comments     Comment: bf had vasectomy   Other Topics Concern  . None   Social History Narrative  . None  Allergies: No Known Allergies  Metabolic Disorder Labs: Lab Results  Component Value Date   HGBA1C 7.1 (H) 01/08/2017   MPG 157 01/08/2017   MPG 134 07/11/2016   No results found for: PROLACTIN Lab Results  Component Value Date   CHOL 157 01/08/2017   TRIG 64 01/08/2017   HDL 37 (L) 01/08/2017   CHOLHDL 4.2 01/08/2017   VLDL 13 01/08/2017   LDLCALC 107 (H) 01/08/2017   LDLCALC 104 09/16/2015     Current Medications: Current Outpatient Prescriptions  Medication Sig Dispense Refill  . Cyanocobalamin (VITAMIN B-12 PO) Take by mouth.    . DULoxetine (CYMBALTA) 60 MG capsule Take 1 capsule (60 mg total) by mouth daily. 30 capsule 1  . ferrous sulfate 325 (65 FE) MG EC tablet Take 325 mg by mouth 3 (three) times daily with meals.    .  gabapentin (NEURONTIN) 100 MG capsule Take 1 capsule (100 mg total) by mouth 3 (three) times daily. (Patient taking differently: Take 100 mg by mouth 3 (three) times daily as needed. ) 90 capsule 1  . HYDROcodone-acetaminophen (NORCO/VICODIN) 5-325 MG tablet One tablet once daily 30 tablet 0  . hydrOXYzine (ATARAX/VISTARIL) 10 MG tablet Take 1 tablet (10 mg total) by mouth at bedtime as needed. 30 tablet 1  . metFORMIN (GLUCOPHAGE-XR) 500 MG 24 hr tablet Take 1 tablet (500 mg total) by mouth daily with breakfast. 90 tablet 3  . metoprolol tartrate (LOPRESSOR) 25 MG tablet TAKE 1 TABLET BY MOUTH  DAILY. 90 tablet 1  . temazepam (RESTORIL) 30 MG capsule TAKE 1 CAPSULE BY MOUTH ONCE DAILY AT BEDTIME AS NEEDED FOR SLEEP 90 capsule 1  . triamterene-hydrochlorothiazide (MAXZIDE-25) 37.5-25 MG tablet Take 1 tablet by mouth daily. 90 tablet 1  . buPROPion (WELLBUTRIN XL) 150 MG 24 hr tablet Take 1 tablet (150 mg total) by mouth daily. 30 tablet 1  . ergocalciferol (VITAMIN D2) 50000 units capsule Take 1 capsule (50,000 Units total) by mouth once a week. One capsule once weekly (Patient not taking: Reported on 03/14/2017) 12 capsule 1   No current facility-administered medications for this visit.     Neurologic: Headache: No Seizure: No Paresthesias: No  Musculoskeletal: Strength & Muscle Tone: within normal limits Gait & Station: normal Patient leans: N/A  Psychiatric Specialty Exam: Review of Systems  Psychiatric/Behavioral: Positive for depression. Negative for hallucinations, substance abuse and suicidal ideas. The patient is nervous/anxious. The patient does not have insomnia.   All other systems reviewed and are negative.   Blood pressure 113/73, pulse 77, height 5\' 5"  (1.651 m), weight 219 lb 12.8 oz (99.7 kg).Body mass index is 36.58 kg/m.  General Appearance: Fairly Groomed  Eye Contact:  Good  Speech:  Clear and Coherent  Volume:  Normal  Mood:  "tired  Affect:  Appropriate,  Congruent, Restricted and down  Thought Process:  Coherent and Goal Directed  Orientation:  Full (Time, Place, and Person)  Thought Content: Logical Perceptions: denies AH/VH  Suicidal Thoughts:  No  Homicidal Thoughts:  No  Memory:  Immediate;   Good Recent;   Good Remote;   Good  Judgement:  Good  Insight:  Fair  Psychomotor Activity:  Normal  Concentration:  Concentration: Good and Attention Span: Good  Recall:  Good  Fund of Knowledge: Good  Language: Good  Akathisia:  No  Handed:  Right  AIMS (if indicated):  N/A  Assets:  Communication Skills Desire for Improvement  ADL's:  Intact  Cognition: WNL  Sleep:  hypersomnia   Assessment LAKISHIA BOURASSA is a 46 y.o. year old female with a history of depression, hypertension, chronic back pain, who presents for follow up appointment for Major depressive disorder, recurrent episode, moderate ([place])  # MDD, moderate, recurrent with anxious distress Although there has been improvement in neurovegetative symptoms, since switching from fluoxetine to duloxetine, she endorses significant fatigue, hypersomnia and anxiety. Will add wellbutrin to target prominent fatigue while monitoring anxiety (no history of seizure or headache). Will continue temazepam for insomnia, prescribed by PCP. Discussed self compassion, behavioral activation; she is encouraged to try daily walking and doing crafts. She is encouraged to continue to see a therapist.   Plan 1. Continue Duloxetine 60 mg daily 2. Start Wellbutrin 150 mg daily 3. Continue Temazepam 30 mg at night as needed for sleep 4. Return to clinic in one month for 30 mins (She is on gabapentin for pain and hydroxyzine for pain)  The patient demonstrates the following risk factors for suicide: Chronic risk factors for suicide include: psychiatric disorder of depression, anxietyand chronic pain. Acute risk factorsfor suicide include: N/A. Protective factorsfor this patient include: positive  social support, responsibility to others (children, family), coping skills and hope for the future. Considering these factors, the overall suicide risk at this point appears to be low. Patient isappropriate for outpatient follow up.  Treatment Plan Summary:Plan as above  The duration of this appointment visit was 30 minutes of face-to-face time with the patient.  Greater than 50% of this time was spent in counseling, explanation of  diagnosis, planning of further management, and coordination of care.  [name], MD 03/14/2017, 8:42 AM

## 2017-03-13 ENCOUNTER — Ambulatory Visit (HOSPITAL_COMMUNITY): Payer: 59 | Admitting: Psychiatry

## 2017-03-14 ENCOUNTER — Encounter: Payer: Self-pay | Admitting: Family Medicine

## 2017-03-14 ENCOUNTER — Ambulatory Visit (INDEPENDENT_AMBULATORY_CARE_PROVIDER_SITE_OTHER): Payer: 59 | Admitting: Family Medicine

## 2017-03-14 ENCOUNTER — Ambulatory Visit (INDEPENDENT_AMBULATORY_CARE_PROVIDER_SITE_OTHER): Payer: 59 | Admitting: Psychiatry

## 2017-03-14 ENCOUNTER — Encounter (HOSPITAL_COMMUNITY): Payer: Self-pay | Admitting: Psychiatry

## 2017-03-14 VITALS — BP 122/82 | HR 78 | Resp 16 | Ht 65.0 in | Wt 221.0 lb

## 2017-03-14 VITALS — BP 113/73 | HR 77 | Ht 65.0 in | Wt 219.8 lb

## 2017-03-14 DIAGNOSIS — Z79891 Long term (current) use of opiate analgesic: Secondary | ICD-10-CM

## 2017-03-14 DIAGNOSIS — I10 Essential (primary) hypertension: Secondary | ICD-10-CM

## 2017-03-14 DIAGNOSIS — F331 Major depressive disorder, recurrent, moderate: Secondary | ICD-10-CM | POA: Diagnosis not present

## 2017-03-14 DIAGNOSIS — G8929 Other chronic pain: Secondary | ICD-10-CM | POA: Diagnosis not present

## 2017-03-14 DIAGNOSIS — Z818 Family history of other mental and behavioral disorders: Secondary | ICD-10-CM

## 2017-03-14 DIAGNOSIS — G479 Sleep disorder, unspecified: Secondary | ICD-10-CM | POA: Diagnosis not present

## 2017-03-14 DIAGNOSIS — Z79899 Other long term (current) drug therapy: Secondary | ICD-10-CM

## 2017-03-14 DIAGNOSIS — Z811 Family history of alcohol abuse and dependence: Secondary | ICD-10-CM

## 2017-03-14 DIAGNOSIS — E1169 Type 2 diabetes mellitus with other specified complication: Secondary | ICD-10-CM

## 2017-03-14 DIAGNOSIS — E669 Obesity, unspecified: Secondary | ICD-10-CM

## 2017-03-14 MED ORDER — HYDROCODONE-ACETAMINOPHEN 5-325 MG PO TABS: ORAL_TABLET | ORAL | 0 refills | Status: DC

## 2017-03-14 MED ORDER — BUPROPION HCL ER (XL) 150 MG PO TB24: 150.0000 mg | ORAL_TABLET | Freq: Every day | ORAL | 1 refills | Status: DC

## 2017-03-14 MED ORDER — DULOXETINE HCL 60 MG PO CPEP: 60.0000 mg | ORAL_CAPSULE | Freq: Every day | ORAL | 1 refills | Status: DC

## 2017-03-14 MED FILL — buPROPion HCL ER (XL) 150 M: 150 | 30 days supply | Qty: 30 | Fill #0

## 2017-03-14 NOTE — Assessment & Plan Note (Signed)
The patient's Controlled Substance registry is reviewed and compliance confirmed. Adequacy of  Pain control and level of function is assessed. Medication dosing is adjusted as deemed appropriate. Twelve weeks of medication is prescribed , patient signs for the script and is provided with a follow up appointment between 11 to 12 weeks .  

## 2017-03-14 NOTE — Patient Instructions (Signed)
F/u in 12 weeks, call if t you need me before   please commit to taking metformin one daily  Please try to start walking for 30 minutes every day  Non fast HBA1C , chem 7 and EGFR 5 sdays before next visit   Foot exam is good today  Thank you  for choosing [facility]. We consider it a privelige to serve you.  Delivering excellent health care in a caring and  compassionate way is our goal.  Partnering with you,  so that together we can achieve this goal is our strategy.

## 2017-03-14 NOTE — Assessment & Plan Note (Signed)
DASH diet and commitment to daily physical activity for a minimum of 30 minutes discussed and encouraged, as a part of hypertension management. The importance of attaining a healthy weight is also discussed.  BP/Weight 03/14/2017 01/08/2017 12/13/2016 09/11/2016 07/11/2016 28/41/3244 0/07/270  Systolic BP 536 644 [phone] 756  Diastolic BP 82 70 76 80 80 88 80  Wt. (Lbs) 221 224 216 227 217 215 214  BMI 36.78 37.28 35.94 37.77 36.11 35.78 34.03  Some encounter information is confidential and restricted. Go to Review Flowsheets activity to see all data.

## 2017-03-14 NOTE — Patient Instructions (Signed)
1. Continue Duloxetine 60 mg daily 2. Start Wellbutrin 150 mg daily 3. Continue Temazepam 30 mg at night as needed for sleep 4. Return to clinic in one month for 30 mins

## 2017-03-14 NOTE — Assessment & Plan Note (Signed)
Updated lab needed at/ before next visit. Mary Roman is reminded of the importance of commitment to daily physical activity for 30 minutes or more, as able and the need to limit carbohydrate intake to 30 to 60 grams per meal to help with blood sugar control.   The need to take medication as prescribed, test blood sugar as directed, and to call between visits if there is a concern that blood sugar is uncontrolled is also discussed.   Mary Roman is reminded of the importance of daily foot exam, annual eye examination, and good blood sugar, blood pressure and cholesterol control.  Diabetic Labs Latest Ref Rng & Units 01/08/2017 12/12/2016 09/11/2016 07/11/2016 02/01/2016  HbA1c <5.7 % 7.1(H) - - 6.3(H) 6.4(H)  Microalbumin Not estab mg/dL - 2.7 26.9(H) - -  Micro/Creat Ratio <30 mcg/mg creat - 9 - - -  Chol <200 mg/dL 157 - - - -  HDL >50 mg/dL 37(L) - - - -  Calc LDL <100 mg/dL 107(H) - - - -  Triglycerides <150 mg/dL 64 - - - -  Creatinine 0.50 - 1.10 mg/dL 0.88 - - 0.90 0.87   BP/Weight 03/14/2017 01/08/2017 12/13/2016 09/11/2016 07/11/2016 75/88/3254 04/06/2640  Systolic BP 583 094 076 808 811 031 594  Diastolic BP 82 70 76 80 80 88 80  Wt. (Lbs) 221 224 216 227 217 215 214  BMI 36.78 37.28 35.94 37.77 36.11 35.78 34.03  Some encounter information is confidential and restricted. Go to Review Flowsheets activity to see all data.   Foot/eye exam completion dates Latest Ref Rng & Units 03/14/2017 01/18/2017  Eye Exam No Retinopathy - No Retinopathy  Foot Form Completion - Done -

## 2017-03-14 NOTE — Assessment & Plan Note (Signed)
Being treated by psychiatry, and therapist

## 2017-03-14 NOTE — Progress Notes (Signed)
Mary Roman     MRN: [id number]      DOB: Jan 26, 1971   HPI Mary Roman is here for follow up and re-evaluation of chronic medical conditions, medication management and IN PARTICULAR HER PAIN MANAGEMENT. Preventive health is updated, specifically  Cancer screening and Immunization.   .Currently under the care of psychiatry for depression and she is working through this, still a way to go, and also in therapy, taking new medication for mental health No exercise commitment, not testing blood sugar, escaping with sleep a lot, partner somewhat frustrated at times with her disconnection from family responsibilities   ROS Denies recent fever or chills. Denies sinus pressure, nasal congestion, ear pain or sore throat. Denies chest congestion, productive cough or wheezing. Denies chest pains, palpitations and leg swelling Denies abdominal pain, nausea, vomiting,diarrhea or constipation.   Denies dysuria, frequency, hesitancy or incontinence. Denies uncontrolled joint pain, swelling and limitation in mobility. Denies headaches, seizures, numbness, or tingling. . Denies skin break down or rash.   PE  BP 122/82   Pulse 78   Resp 16   Ht 5\' 5"  (1.651 m)   Wt 221 lb (100.2 kg)   SpO2 99%   BMI 36.78 kg/m   Patient alert and oriented and in no cardiopulmonary distress.  HEENT: No facial asymmetry, EOMI,   oropharynx pink and moist.  Neck supple no JVD, no mass.  Chest: Clear to auscultation bilaterally.  CVS: S1, S2 no murmurs, no S3.Regular rate.  ABD: Soft non tender.   Ext: No edema  MS: Adequate ROM spine, shoulders, hips and knees.  Skin: Intact, no ulcerations or rash noted.  Psych: Good eye contact, flat  affect. Memory intact not anxious or depressed appearing.  CNS: CN 2-12 intact, power,  normal throughout.no focal deficits noted.   Assessment & Plan  Encounter for chronic pain management The patient's Controlled Substance registry is reviewed and  compliance confirmed. Adequacy of  Pain control and level of function is assessed. Medication dosing is adjusted as deemed appropriate. Twelve weeks of medication is prescribed , patient signs for the script and is provided with a follow up appointment between 11 to 12 weeks .   Essential hypertension DASH diet and commitment to daily physical activity for a minimum of 30 minutes discussed and encouraged, as a part of hypertension management. The importance of attaining a healthy weight is also discussed.  BP/Weight 03/14/2017 01/08/2017 12/13/2016 09/11/2016 07/11/2016 09/32/3557 09/29/2023  Systolic BP 427 062 376 283 151 761 607  Diastolic BP 82 70 76 80 80 88 80  Wt. (Lbs) 221 224 216 227 217 215 214  BMI 36.78 37.28 35.94 37.77 36.11 35.78 34.03  Some encounter information is confidential and restricted. Go to Review Flowsheets activity to see all data.       Diabetes mellitus type 2 in obese [facility]) Updated lab needed at/ before next visit. Mary Roman is reminded of the importance of commitment to daily physical activity for 30 minutes or more, as able and the need to limit carbohydrate intake to 30 to 60 grams per meal to help with blood sugar control.   The need to take medication as prescribed, test blood sugar as directed, and to call between visits if there is a concern that blood sugar is uncontrolled is also discussed.   Mary Roman is reminded of the importance of daily foot exam, annual eye examination, and good blood sugar, blood pressure and cholesterol control.  Diabetic Labs Latest Ref  Rng & Units 01/08/2017 12/12/2016 09/11/2016 07/11/2016 02/01/2016  HbA1c <5.7 % 7.1(H) - - 6.3(H) 6.4(H)  Microalbumin Not estab mg/dL - 2.7 26.9(H) - -  Micro/Creat Ratio <30 mcg/mg creat - 9 - - -  Chol <200 mg/dL 157 - - - -  HDL >50 mg/dL 37(L) - - - -  Calc LDL <100 mg/dL 107(H) - - - -  Triglycerides <150 mg/dL 64 - - - -  Creatinine 0.50 - 1.10 mg/dL 0.88 - - 0.90 0.87    BP/Weight 03/14/2017 01/08/2017 12/13/2016 09/11/2016 07/11/2016 72/03/4708 12/29/8364  Systolic BP 294 765 465 035 465 681 275  Diastolic BP 82 70 76 80 80 88 80  Wt. (Lbs) 221 224 216 227 217 215 214  BMI 36.78 37.28 35.94 37.77 36.11 35.78 34.03  Some encounter information is confidential and restricted. Go to Review Flowsheets activity to see all data.   Foot/eye exam completion dates Latest Ref Rng & Units 03/14/2017 01/18/2017  Eye Exam No Retinopathy - No Retinopathy  Foot Form Completion - Done -        MDD (major depressive disorder), recurrent episode, moderate ([place]) Being treated by psychiatry, and therapist

## 2017-03-20 MED FILL — METOPROLOL TARTRATE 25 MG T: 25 | 90 days supply | Qty: 90 | Fill #1

## 2017-03-20 MED FILL — DULoxetine HCL 60 MG CPEP: 60 | 30 days supply | Qty: 30 | Fill #0

## 2017-03-20 MED FILL — TEMAZEPAM 30 MG CAPSULE: 30 | 90 days supply | Qty: 90 | Fill #1

## 2017-03-28 ENCOUNTER — Encounter (HOSPITAL_COMMUNITY): Payer: Self-pay | Admitting: Psychiatry

## 2017-03-28 ENCOUNTER — Ambulatory Visit (INDEPENDENT_AMBULATORY_CARE_PROVIDER_SITE_OTHER): Payer: 59 | Admitting: Psychiatry

## 2017-03-28 DIAGNOSIS — F331 Major depressive disorder, recurrent, moderate: Secondary | ICD-10-CM

## 2017-03-28 NOTE — Progress Notes (Signed)
Patient:  Mary Roman   DOB: 1971-03-22  MR Number: [id number]    Location: [facility]:  [address] [place],  [place], [address]  Start: Thursday 03/28/2017 8:05 AM  End: Thursday 03/28/2017 8:58 AM    Provider/Observer:     Maurice Small, MSW, LCSW   Chief Complaint:           Depression/anxiety              Reason For Service:     Mary Roman is a 46 y.o. female who is referred for services by psychiatrist Dr. Modesta Messing. Patient reports trying to cope with anxiety, depression, and everyday stress. She states having problems with finances and occasionally work. She says most of her stress is related to not having enough time in the day to do what she needs to do. 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 Patient has 2 children  and a fiance. She is the sole breadwinner for her home. She states having difficulty getting out of bed in the mornings, having to really push herself, having no energy, poor motivation, diminished interest in activities, and sleeping excessively.      Interventions Strategy:  Supportive/CBT  Participation Level:   Active  Participation Quality:  Appropriate      Behavioral Observation:  Casual, Alert, and Appropriate and Tearful.   Current Psychosocial Factors: Caretaker responsibilities for mother who had a stroke 5 years ago, financial stress, discord with siblings, job stress, fiancee had recent hospitalization,   Content of Session:   reviewed symptoms, assisted patient identify triggers of increased stress and anxiety, facilitated expression of thoughts and feelings regarding stressors, praised and reinforced patient's efforts to improve assertiveness skills, assisted patient identify realistic expectations of self and interactions at her job, assisted patient examine her pattern in relationships, discussed and replaced thoughts that promote exaggerated sense of responsibility with healthy alternatives,  assisted patient identify ways to have more time for self  Current Status:   Low energy depressed mood, poor concentration, feelings of hopelessness, irritability, changes in appetite, sleep difficulty, feelings of worthlessness, tearfulness, anxiety, muscle tension, excessive worrying  Suicidal/Homicidal:   No  Patient Progress:   Fair. Patient reports increased difficulty concentrating, anxiety and worry since last session. She reports no improvement in other symptoms of depression and anxiety. She reports multiple stressors including job issues, fiancee being hospitalized for five days, and issues with her biological family. She also has been sick with sinus issues. She reports having little to no time for self.  Target Goals:   1. Learn and implement calming strategies to reduce/manage overall anxiety.    2. Learn and implement behavioral strategies to overcome depression.    3.Verbalize an understanding and resolution of current interpersonal problems.    4. Identify and replace thoughts and beliefs that support depression/anxiety.  Last Reviewed:   02/28/2017  Goals Addressed Today:    1,2,3,4  Plan:      Return again in 2  weeks.  Impression/Diagnosis:  Patient presents with a history of symptoms of depression that initially began about 5 years ago after her mother had a stroke. Symptoms have waxed and waned since that time. She and one of her siblings assumed most of the caretaker responsibilities for her mother. Symptoms worsened in the past couple of months as her sibling who had helped her with mother moved to [place] and no longer is as available to assist patient  with their mother's care. Patient also has multiple other stressors. Her current symptoms include low energy depressed mood, poor concentration, feelings of hopelessness, irritability, changes in appetite, sleep difficulty, feelings of worthlessness, tearfulness, anxiety, muscle tension, excessive worrying                     Diagnosis:  Axis I: Major depressive disorder, recurrent, moderate          Axis II: Deferred  BYNUM,PEGGY, LCSW 03/28/2017

## 2017-04-04 MED FILL — TRIAMTERENE-HCTZ 37.5-25 MG: 37.5-25 | 90 days supply | Qty: 90 | Fill #1

## 2017-04-05 ENCOUNTER — Telehealth (HOSPITAL_COMMUNITY): Payer: Self-pay | Admitting: *Deleted

## 2017-04-05 NOTE — Telephone Encounter (Signed)
left voice message, provider out of office 04/25/17.   we will see you for your scheduled appointment on 04/17/17.   please call back if you would like to reschedule the 04/25/17 appointment.

## 2017-04-11 NOTE — Progress Notes (Signed)
[facility] MD/PA/NP OP Progress Note  04/15/2017 8:41 AM Mary Roman  MRN:  [id number]  Chief Complaint:  Chief Complaint    Depression; Anxiety; Follow-up     HPI:  Patient presents for follow up appointment for depression. She states that she feels tired all the time. She thought of missing her work due to her fatigue, although it used to be her "escape". She has been prepared for storm for her mother and had been very busy doing it during lunch time and after work. She states that she tries to "accept" situation about her mother. She believes that she has "no boundary" with other people and tries to control situation. She talks about her fiance, who applied for a job. She feels that she "forced" him to find a job due to financial strain. He does not appear to understand the financial strain is one of her concern and she feels lonely that he does not understand her depression. She tends to internalize things without expressing her opinion. She has insomnia. She has difficulty in concentration. She has passive SI. She feels anxious. She denies panic attacks.   Per [employer] She is on hyrocodone. Temazepam 90 tabs for 90 days filled on 03/20/2017 no refill Xanax last filled in May  Visit Diagnosis:    ICD-10-CM   1. Major depressive disorder, recurrent episode, moderate (HCC) F33.1     Past Psychiatric History:  I have reviewed the patient's psychiatry history in detail and updated the patient record. Outpatient: couple therapist in 2015 Psychiatry admission: denies Previous suicide attempt: denies Past trials of medication: fluoxetine, buspirone, Trazodone ("weird" feeling) temazepam, Xanax History of violence: denies  Past Medical History:  Past Medical History:  Diagnosis Date  . Anxiety   . Chest tightness, discomfort, or pressure 05/2010   Emergency Department   . Chronic back pain    with disc disease   . Diabetes mellitus, type II ([place])   . Headache(784.0)   .  Hypertension   . Obesity   . Palpitation   . Thyromegaly    TSH-0.8 and 07/2010    Past Surgical History:  Procedure Laterality Date  . None      Family Psychiatric History:  I have reviewed the patient's family history in detail and updated the patient record.  Family History:  Family History  Problem Relation Age of Onset  . Diabetes Mother   . Hypertension Mother   . Stroke Mother   . Colon cancer Mother   . Cancer Mother 47       colon localized  . Alcohol abuse Mother   . Pneumonia Father   . Hypertension Sister   . Anxiety disorder Sister   . Hypertension Sister   . Leukemia Sister   . Diabetes Maternal Grandmother   . Hypertension Maternal Grandmother   . Hypertension Maternal Grandfather   . Heart attack Maternal Uncle     Social History:  Social History   Social History  . Marital status: Single    Spouse name: N/A  . Number of children: 2  . Years of education: N/A   Occupational History  . employed in medical office     Social History Main Topics  . Smoking status: Never Smoker  . Smokeless tobacco: Never Used  . Alcohol use No     Comment: 01-17-2017 per pt no  . Drug use: No     Comment: 01-17-2017 per pt no  . Sexual activity: Yes    Birth  control/ protection: Other-see comments     Comment: bf had vasectomy   Other Topics Concern  . None   Social History Narrative  . None    Allergies: No Known Allergies  Metabolic Disorder Labs: Lab Results  Component Value Date   HGBA1C 7.1 (H) 01/08/2017   MPG 157 01/08/2017   MPG 134 07/11/2016   No results found for: PROLACTIN Lab Results  Component Value Date   CHOL 157 01/08/2017   TRIG 64 01/08/2017   HDL 37 (L) 01/08/2017   CHOLHDL 4.2 01/08/2017   VLDL 13 01/08/2017   LDLCALC 107 (H) 01/08/2017   LDLCALC 104 09/16/2015   Lab Results  Component Value Date   TSH 0.95 01/08/2017   TSH 1.198 02/25/2014    Therapeutic Level Labs: No results found for: LITHIUM No results  found for: VALPROATE No components found for:  CBMZ  Current Medications: Current Outpatient Prescriptions  Medication Sig Dispense Refill  . buPROPion (WELLBUTRIN XL) 150 MG 24 hr tablet Take 1 tablet (150 mg total) by mouth daily. 30 tablet 1  . Cyanocobalamin (VITAMIN B-12 PO) Take by mouth.    . DULoxetine (CYMBALTA) 60 MG capsule Take 1 capsule (60 mg total) by mouth daily. 30 capsule 1  . ergocalciferol (VITAMIN D2) 50000 units capsule Take 1 capsule (50,000 Units total) by mouth once a week. One capsule once weekly 12 capsule 1  . ferrous sulfate 325 (65 FE) MG EC tablet Take 325 mg by mouth 3 (three) times daily with meals.    . gabapentin (NEURONTIN) 100 MG capsule Take 1 capsule (100 mg total) by mouth 3 (three) times daily. (Patient taking differently: Take 100 mg by mouth 3 (three) times daily as needed. ) 90 capsule 1  . HYDROcodone-acetaminophen (NORCO/VICODIN) 5-325 MG tablet One tablet once daily 30 tablet 0  . hydrOXYzine (ATARAX/VISTARIL) 10 MG tablet Take 1 tablet (10 mg total) by mouth at bedtime as needed. 30 tablet 1  . metFORMIN (GLUCOPHAGE-XR) 500 MG 24 hr tablet Take 1 tablet (500 mg total) by mouth daily with breakfast. 90 tablet 3  . metoprolol tartrate (LOPRESSOR) 25 MG tablet TAKE 1 TABLET BY MOUTH  DAILY. 90 tablet 1  . temazepam (RESTORIL) 30 MG capsule TAKE 1 CAPSULE BY MOUTH ONCE DAILY AT BEDTIME AS NEEDED FOR SLEEP 90 capsule 1  . triamterene-hydrochlorothiazide (MAXZIDE-25) 37.5-25 MG tablet Take 1 tablet by mouth daily. 90 tablet 1   No current facility-administered medications for this visit.      Musculoskeletal: Strength & Muscle Tone: within normal limits Gait & Station: normal Patient leans: N/A  Psychiatric Specialty Exam: Review of Systems  Psychiatric/Behavioral: Positive for depression. Negative for hallucinations, substance abuse and suicidal ideas. The patient is nervous/anxious and has insomnia.   All other systems reviewed and are  negative.   Blood pressure 94/78, pulse 92, height 5\' 5"  (1.651 m), weight 219 lb (99.3 kg), SpO2 96 %.Body mass index is 36.44 kg/m.  General Appearance: Fairly Groomed  Eye Contact:  Good  Speech:  Clear and Coherent  Volume:  Normal  Mood:  Anxious and Depressed  Affect:  Appropriate, Congruent and sad, a little restricted  Thought Process:  Coherent and Goal Directed  Orientation:  Full (Time, Place, and Person)  Thought Content: Logical Perceptions: denies AH/VH  Suicidal Thoughts:  Yes.  without intent/plan  Homicidal Thoughts:  No  Memory:  Immediate;   Good Recent;   Good Remote;   Good  Judgement:  Good  Insight:  Fair  Psychomotor Activity:  Normal  Concentration:  Concentration: Good and Attention Span: Good  Recall:  Good  Fund of Knowledge: Good  Language: Good  Akathisia:  No  Handed:  Right  AIMS (if indicated): not done  Assets:  Communication Skills Desire for Improvement  ADL's:  Intact  Cognition: WNL  Sleep:  Poor   Screenings: GAD-7     Office Visit from 01/08/2017 in [facility]  Total GAD-7 Score  14    PHQ2-9     Counselor from 02/14/2017 in [facility] Office Visit from 01/08/2017 in [facility] Office Visit from 12/13/2016 in [facility] Office Visit from 12/20/2014 in [facility] Office Visit from 08/23/2014 in [facility]  PHQ-2 Total Score  5  5  6  2  3   PHQ-9 Total Score  16  20  25  7  12        Assessment and Plan:  Mary Roman is a 46 y.o. year old female with a history of depression, hypertension, chronic back pain, who presents for follow up appointment for Major depressive disorder, recurrent episode, moderate ([facility])  # MDD, moderate, recurrent with anxious distress She continues to endorse depression with prominent fatigue in the setting of discordance with her fiance and her family. Will uptitrate wellbutrin for augmentation  therapy for depression. She has no history of seizure or headache. Will continue duloxetine to target depression. She is on temazepam prescribed by PCP. Explored her pattern of lack of self compassion and core belief of failure. Discussed effective communication style. Discussed behavioral activation. She is encouraged to continue to see a therapist.   Plan 1. Continue duloxetine 60 mg daily 2. Increase wellbutrin 300 mg daily 3. Continue temazepam 30 mg at night as needed for sleep 4. Return to clinic in one month for 30 mins - She will continue to see Ms Bynyum for therapy  (She is on gabapentin for pain and hydroxyzine)  The patient demonstrates the following risk factors for suicide: Chronic risk factors for suicide include: psychiatric disorder of depression, anxietyand chronic pain. Acute risk factorsfor suicide include: N/A. Protective factorsfor this patient include: positive social support, responsibility to others (children, family), coping skills and hope for the future. Considering these factors, the overall suicide risk at this point appears to be low. Patient isappropriate for outpatient follow up.  The duration of this appointment visit was 30 minutes of face-to-face time with the patient.  Greater than 50% of this time was spent in counseling, explanation of  diagnosis, planning of further management, and coordination of care.  [name], MD 04/15/2017, 8:41 AM

## 2017-04-15 ENCOUNTER — Encounter (HOSPITAL_COMMUNITY): Payer: Self-pay | Admitting: Psychiatry

## 2017-04-15 ENCOUNTER — Ambulatory Visit (INDEPENDENT_AMBULATORY_CARE_PROVIDER_SITE_OTHER): Payer: 59 | Admitting: Psychiatry

## 2017-04-15 VITALS — BP 94/78 | HR 92 | Ht 65.0 in | Wt 219.0 lb

## 2017-04-15 DIAGNOSIS — G4700 Insomnia, unspecified: Secondary | ICD-10-CM | POA: Diagnosis not present

## 2017-04-15 DIAGNOSIS — Z79899 Other long term (current) drug therapy: Secondary | ICD-10-CM | POA: Diagnosis not present

## 2017-04-15 DIAGNOSIS — Z818 Family history of other mental and behavioral disorders: Secondary | ICD-10-CM | POA: Diagnosis not present

## 2017-04-15 DIAGNOSIS — F331 Major depressive disorder, recurrent, moderate: Secondary | ICD-10-CM

## 2017-04-15 MED ORDER — BUPROPION HCL ER (XL) 300 MG PO TB24: 300.0000 mg | ORAL_TABLET | Freq: Every day | ORAL | 1 refills | Status: DC

## 2017-04-15 MED ORDER — DULOXETINE HCL 60 MG PO CPEP: 60.0000 mg | ORAL_CAPSULE | Freq: Every day | ORAL | 1 refills | Status: DC

## 2017-04-15 MED FILL — DULoxetine HCL 60 MG CPEP: 60 | 30 days supply | Qty: 30 | Fill #0

## 2017-04-15 MED FILL — BUPROPION HCL XL 300 MG TAB: 300 | 30 days supply | Qty: 30 | Fill #0

## 2017-04-15 NOTE — Patient Instructions (Signed)
1. Continue duloxetine 60 mg daily 2. Increase wellbutrin 300 mg daily 3. Continue temazepam 30 mg at night as needed for sleep 4. Return to clinic in one month for 30 mins

## 2017-04-17 ENCOUNTER — Ambulatory Visit (HOSPITAL_COMMUNITY): Payer: 59 | Admitting: Psychiatry

## 2017-04-25 ENCOUNTER — Ambulatory Visit (HOSPITAL_COMMUNITY): Payer: Self-pay | Admitting: Psychiatry

## 2017-05-07 ENCOUNTER — Other Ambulatory Visit: Payer: Self-pay | Admitting: Family Medicine

## 2017-05-07 DIAGNOSIS — Z1231 Encounter for screening mammogram for malignant neoplasm of breast: Secondary | ICD-10-CM

## 2017-05-09 ENCOUNTER — Encounter: Payer: Self-pay | Admitting: Family Medicine

## 2017-05-13 ENCOUNTER — Telehealth: Payer: Self-pay | Admitting: Family Medicine

## 2017-05-13 ENCOUNTER — Ambulatory Visit (HOSPITAL_COMMUNITY): Payer: Self-pay | Admitting: Psychiatry

## 2017-05-13 ENCOUNTER — Encounter: Payer: Self-pay | Admitting: Family Medicine

## 2017-05-13 NOTE — Telephone Encounter (Signed)
I have advised her to come just for you to weight her tomorrow and document as  Nurse visit, and I will start her on half phentermine daily

## 2017-05-13 NOTE — Telephone Encounter (Signed)
Patient called in to schedule a quick appt as discussed with Dr.Simpson to talk about medications, she couldn't come today, she is requesting something tomorrow but Dr.Simpson is overbooked. pls check with Dr.Simpson and let me know what to do. Thanks.

## 2017-05-14 ENCOUNTER — Ambulatory Visit: Payer: 59

## 2017-05-14 ENCOUNTER — Other Ambulatory Visit: Payer: Self-pay | Admitting: Family Medicine

## 2017-05-14 VITALS — Wt 219.0 lb

## 2017-05-14 DIAGNOSIS — E669 Obesity, unspecified: Secondary | ICD-10-CM

## 2017-05-14 MED ORDER — PHENTERMINE HCL 37.5 MG PO TABS: 37.5000 mg | ORAL_TABLET | Freq: Every day | ORAL | 0 refills | Status: DC

## 2017-05-14 MED FILL — PHENTERMINE 37.5 MG TABLET: 37.5 | 30 days supply | Qty: 30 | Fill #0

## 2017-05-14 NOTE — Progress Notes (Signed)
[facility] MD/PA/NP OP Progress Note  05/20/2017 8:35 AM Mary Roman  MRN:  [id number]  Chief Complaint:  Chief Complaint    Depression; Follow-up     HPI:  Patient presents for follow-up appointment for depression.  She states that she has been doing more exercise.  She started craft again and did decoration for halloween with her daughter. she also likes the fact that her fianc started to work full-time. She talks about an episode on weekend; she said "no" to help people at church as she was overwhelmed by tasks she needs to do at home now that her fiance is out for work. Although she used to feel "obligatory" and "guilty of not helping," she talked with the one at church and felt "encouraged." She states that she used to have "self inflicted" anxiety and depression; she thinks that she needs to say "no" at times. She talks about issues at work. She sleeps better, although she still needs to take temazepam. She has more energy. She reports good concentration. She denies SI. She feels anxious at times, but not worsened after starting Wellbutrin. She denies panic attacks.   Per PMP,  On phentermine, hydrocodone, temazepam last filled on 03/20/2017   Visit Diagnosis:    ICD-10-CM   1. Major depressive disorder, recurrent episode, moderate (HCC) F33.1     Past Psychiatric History:  I have reviewed the patient's psychiatry history in detail and updated the patient record. Outpatient: couple therapist in 2015 Psychiatry admission: denies Previous suicide attempt: denies Past trials of medication: fluoxetine, buspirone, Trazodone ("weird" feeling) temazepam, Xanax History of violence: denies  Past Medical History:  Past Medical History:  Diagnosis Date  . Anxiety   . Chest tightness, discomfort, or pressure 05/2010   Emergency Department   . Chronic back pain    with disc disease   . Diabetes mellitus, type II ([place])   . Headache(784.0)   . Hypertension   . Obesity   .  Palpitation   . Thyromegaly    TSH-0.8 and 07/2010    Past Surgical History:  Procedure Laterality Date  . None      Family Psychiatric History:  I have reviewed the patient's family history in detail and updated the patient record.  Family History:  Family History  Problem Relation Age of Onset  . Diabetes Mother   . Hypertension Mother   . Stroke Mother   . Colon cancer Mother   . Cancer Mother 27       colon localized  . Alcohol abuse Mother   . Pneumonia Father   . Hypertension Sister   . Anxiety disorder Sister   . Hypertension Sister   . Leukemia Sister   . Diabetes Maternal Grandmother   . Hypertension Maternal Grandmother   . Hypertension Maternal Grandfather   . Heart attack Maternal Uncle     Social History:  Social History   Social History  . Marital status: Single    Spouse name: N/A  . Number of children: 2  . Years of education: N/A   Occupational History  . employed in medical office     Social History Main Topics  . Smoking status: Never Smoker  . Smokeless tobacco: Never Used  . Alcohol use No     Comment: 01-17-2017 per pt no  . Drug use: No     Comment: 01-17-2017 per pt no  . Sexual activity: Yes    Birth control/ protection: Other-see comments     Comment:  bf had vasectomy   Other Topics Concern  . Not on file   Social History Narrative  . No narrative on file    Allergies: No Known Allergies  Metabolic Disorder Labs: Lab Results  Component Value Date   HGBA1C 7.1 (H) 01/08/2017   MPG 157 01/08/2017   MPG 134 07/11/2016   No results found for: PROLACTIN Lab Results  Component Value Date   CHOL 157 01/08/2017   TRIG 64 01/08/2017   HDL 37 (L) 01/08/2017   CHOLHDL 4.2 01/08/2017   VLDL 13 01/08/2017   LDLCALC 107 (H) 01/08/2017   LDLCALC 104 09/16/2015   Lab Results  Component Value Date   TSH 0.95 01/08/2017   TSH 1.198 02/25/2014    Therapeutic Level Labs: No results found for: LITHIUM No results found for:  VALPROATE No components found for:  CBMZ  Current Medications: Current Outpatient Prescriptions  Medication Sig Dispense Refill  . buPROPion (WELLBUTRIN XL) 300 MG 24 hr tablet Take 1 tablet (300 mg total) by mouth daily. 30 tablet 0  . Cyanocobalamin (VITAMIN B-12 PO) Take by mouth.    . DULoxetine (CYMBALTA) 60 MG capsule Take 1 capsule (60 mg total) by mouth daily. 30 capsule 0  . ergocalciferol (VITAMIN D2) 50000 units capsule Take 1 capsule (50,000 Units total) by mouth once a week. One capsule once weekly 12 capsule 1  . ferrous sulfate 325 (65 FE) MG EC tablet Take 325 mg by mouth 3 (three) times daily with meals.    . gabapentin (NEURONTIN) 100 MG capsule Take 1 capsule (100 mg total) by mouth 3 (three) times daily. (Patient taking differently: Take 100 mg by mouth 3 (three) times daily as needed. ) 90 capsule 1  . HYDROcodone-acetaminophen (NORCO/VICODIN) 5-325 MG tablet One tablet once daily 30 tablet 0  . hydrOXYzine (ATARAX/VISTARIL) 10 MG tablet Take 1 tablet (10 mg total) by mouth at bedtime as needed. 30 tablet 1  . metFORMIN (GLUCOPHAGE-XR) 500 MG 24 hr tablet Take 1 tablet (500 mg total) by mouth daily with breakfast. 90 tablet 3  . metoprolol tartrate (LOPRESSOR) 25 MG tablet TAKE 1 TABLET BY MOUTH  DAILY. 90 tablet 1  . phentermine (ADIPEX-P) 37.5 MG tablet Take 1 tablet (37.5 mg total) by mouth daily before breakfast. 30 tablet 0  . temazepam (RESTORIL) 30 MG capsule TAKE 1 CAPSULE BY MOUTH ONCE DAILY AT BEDTIME AS NEEDED FOR SLEEP 90 capsule 1  . triamterene-hydrochlorothiazide (MAXZIDE-25) 37.5-25 MG tablet Take 1 tablet by mouth daily. 90 tablet 1   No current facility-administered medications for this visit.      Musculoskeletal: Strength & Muscle Tone: within normal limits Gait & Station: normal Patient leans: N/A  Psychiatric Specialty Exam: Review of Systems  Psychiatric/Behavioral: Positive for depression. Negative for hallucinations, substance abuse and  suicidal ideas. The patient is nervous/anxious and has insomnia.   All other systems reviewed and are negative.   Blood pressure 124/72, pulse 88, height 5\' 5"  (1.651 m), weight 219 lb (99.3 kg).Body mass index is 36.44 kg/m.  General Appearance: Fairly Groomed  Eye Contact:  Good  Speech:  Clear and Coherent  Volume:  Normal  Mood:  "better"  Affect:  Appropriate, Congruent and slightly down but more reactive  Thought Process:  Coherent and Goal Directed  Orientation:  Full (Time, Place, and Person)  Thought Content: Logical Perceptions: denies AH/VH  Suicidal Thoughts:  No  Homicidal Thoughts:  No  Memory:  Immediate;   Good Recent;  Good Remote;   Good  Judgement:  Good  Insight:  Good  Psychomotor Activity:  Normal  Concentration:  Concentration: Good and Attention Span: Good  Recall:  Good  Fund of Knowledge: Good  Language: Good  Akathisia:  No  Handed:  Right  AIMS (if indicated): not done  Assets:  Communication Skills Desire for Improvement  ADL's:  Intact  Cognition: WNL  Sleep:  Fair on medication   Screenings: GAD-7     Office Visit from 01/08/2017 in [facility]  Total GAD-7 Score  14    PHQ2-9     Counselor from 02/14/2017 in [facility] Office Visit from 01/08/2017 in [facility] Office Visit from 12/13/2016 in [facility] Office Visit from 12/20/2014 in [facility] Office Visit from 08/23/2014 in [facility]  PHQ-2 Total Score  5  5  6  2  3   PHQ-9 Total Score  16  20  25  7  12        Assessment and Plan:  Mary Roman is a 46 y.o. year old female with a history of depression, hypertension, chronic back pain,  , who presents for follow up appointment for Major depressive disorder, recurrent episode, moderate ([place])  # MDD, moderate, recurrent with anxious distress She reports overall improvement in neurovegetative symptoms since uptitration of  Wellbutrin. Will continue duloxetine to target depression and Wellbutrin as adjunctive treatment for depression. She has no known history of seizure, headache. She will continue temazepam prn for insomnia, prescribed by PCP. Exam is notable for good insight into her behavioral pattern. Discussed self compassion and healthy boundary. Discussed cognitive defusion.   Plan 1. Continue duloxetine 60 mg daily  2. Continue Wellbutrin 300 mg daily  3. Continue temazepam 30 mg at night as needed for sleep 4. Return to clinic in one month for 30 mins - She will continue to see Ms Bynyum for therapy  (She is on gabapentin for pain and hydroxyzine)  The patient demonstrates the following risk factors for suicide: Chronic risk factors for suicide include: psychiatric disorder of depression, anxietyand chronic pain. Acute risk factorsfor suicide include: N/A. Protective factorsfor this patient include: positive social support, responsibility to others (children, family), coping skills and hope for the future. Considering these factors, the overall suicide risk at this point appears to be low. Patient isappropriate for outpatient follow up.  The duration of this appointment visit was 30 minutes of face-to-face time with the patient.  Greater than 50% of this time was spent in counseling, explanation of  diagnosis, planning of further management, and coordination of care.  [name], MD 05/20/2017, 8:35 AM

## 2017-05-15 MED FILL — DULoxetine HCL 60 MG CPEP: 60 | 30 days supply | Qty: 30 | Fill #1

## 2017-05-15 MED FILL — BUPROPION HCL XL 300 MG TAB: 300 | 30 days supply | Qty: 30 | Fill #1

## 2017-05-20 ENCOUNTER — Ambulatory Visit (INDEPENDENT_AMBULATORY_CARE_PROVIDER_SITE_OTHER): Payer: 59 | Admitting: Psychiatry

## 2017-05-20 VITALS — BP 124/72 | HR 88 | Ht 65.0 in | Wt 219.0 lb

## 2017-05-20 DIAGNOSIS — R450 Nervousness: Secondary | ICD-10-CM

## 2017-05-20 DIAGNOSIS — F419 Anxiety disorder, unspecified: Secondary | ICD-10-CM | POA: Diagnosis not present

## 2017-05-20 DIAGNOSIS — G4700 Insomnia, unspecified: Secondary | ICD-10-CM | POA: Diagnosis not present

## 2017-05-20 DIAGNOSIS — Z818 Family history of other mental and behavioral disorders: Secondary | ICD-10-CM | POA: Diagnosis not present

## 2017-05-20 DIAGNOSIS — F331 Major depressive disorder, recurrent, moderate: Secondary | ICD-10-CM | POA: Diagnosis not present

## 2017-05-20 MED ORDER — DULOXETINE HCL 60 MG PO CPEP: 60.0000 mg | ORAL_CAPSULE | Freq: Every day | ORAL | 0 refills | Status: DC

## 2017-05-20 MED ORDER — BUPROPION HCL ER (XL) 300 MG PO TB24
300.0000 mg | ORAL_TABLET | Freq: Every day | ORAL | 0 refills | Status: DC
Start: 2017-05-20 — End: 2017-06-17

## 2017-05-20 NOTE — Patient Instructions (Signed)
1. Continue duloxetine 60 mg daily 2. Continue wellbutrin 300 mg daily 3. Continue temazepam 30 mg at night as needed for sleep 4. Return to clinic in one month for 30 mins

## 2017-05-24 ENCOUNTER — Encounter (HOSPITAL_COMMUNITY): Payer: Self-pay

## 2017-05-24 ENCOUNTER — Ambulatory Visit (HOSPITAL_COMMUNITY)
Admission: RE | Admit: 2017-05-24 | Discharge: 2017-05-24 | Disposition: A | Discharge status: Home / Self Care | Payer: 59 | Source: Ambulatory Visit | Attending: Family Medicine | Admitting: Family Medicine

## 2017-05-24 DIAGNOSIS — Z1231 Encounter for screening mammogram for malignant neoplasm of breast: Secondary | ICD-10-CM | POA: Diagnosis not present

## 2017-06-06 ENCOUNTER — Encounter: Payer: Self-pay | Admitting: Family Medicine

## 2017-06-06 ENCOUNTER — Ambulatory Visit (INDEPENDENT_AMBULATORY_CARE_PROVIDER_SITE_OTHER): Payer: 59 | Admitting: Family Medicine

## 2017-06-06 VITALS — BP 118/78 | HR 92 | Resp 16 | Ht 65.0 in | Wt 214.0 lb

## 2017-06-06 DIAGNOSIS — E669 Obesity, unspecified: Secondary | ICD-10-CM | POA: Diagnosis not present

## 2017-06-06 DIAGNOSIS — G4730 Sleep apnea, unspecified: Secondary | ICD-10-CM

## 2017-06-06 DIAGNOSIS — I10 Essential (primary) hypertension: Secondary | ICD-10-CM | POA: Diagnosis not present

## 2017-06-06 DIAGNOSIS — E1169 Type 2 diabetes mellitus with other specified complication: Secondary | ICD-10-CM

## 2017-06-06 DIAGNOSIS — G8929 Other chronic pain: Secondary | ICD-10-CM | POA: Diagnosis not present

## 2017-06-06 MED ORDER — HYDROCODONE-ACETAMINOPHEN 5-325 MG PO TABS: ORAL_TABLET | ORAL | 0 refills | Status: DC

## 2017-06-06 NOTE — Patient Instructions (Addendum)
F/u week of Jan  7 call if you need me sooner  Lab non fasting in am  Please ensure you commit to once weekly vit D, prescribed, also take OTC vit B12  Congrats on weight loss, commit to exercise 4 days weekly to 5 , doing  Very well and healthy food choice  You are AGAIN  referred for sleep study with Dr Merlene Laughter you need this , please go they will calll you need this done!  No changes in meds  Please work on good  health habits so that your health will improve. 1. Commitment to daily physical activity for 30 to 60  minutes, if you are able to do this.  2. Commitment to wise food choices. Aim for half of your  food intake to be vegetable and fruit, one quarter starchy foods, and one quarter protein. Try to eat on a regular schedule  3 meals per day, snacking between meals should be limited to vegetables or fruits or small portions of nuts. 64 ounces of water per day is generally recommended, unless you have specific health conditions, like heart failure or kidney failure where you will need to limit fluid intake.  3. Commitment to sufficient and a  good quality of physical and mental rest daily, generally between 6 to 8 hours per day.  WITH PERSISTANCE AND PERSEVERANCE, THE IMPOSSIBLE , BECOMES THE NORM!   Thanks for choosing [facility], we consider it a privelige to serve you.

## 2017-06-09 MED ORDER — ERGOCALCIFEROL 1.25 MG (50000 UT) PO CAPS: 50000.0000 [IU] | ORAL_CAPSULE | ORAL | 1 refills | Status: DC

## 2017-06-09 NOTE — Assessment & Plan Note (Signed)
The patient's Controlled Substance registry is reviewed and compliance confirmed. Adequacy of  Pain control and level of function is assessed. Medication dosing is adjusted as deemed appropriate. Twelve weeks of medication is prescribed , patient signs for the script and is provided with a follow up appointment between 11 to 12 weeks .  

## 2017-06-09 NOTE — Assessment & Plan Note (Signed)
Improved, continue half phentermine daily Patient re-educated about  the importance of commitment to a  minimum of 150 minutes of exercise per week.  The importance of healthy food choices with portion control discussed. Encouraged to start a food diary, count calories and to consider  joining a support group. Sample diet sheets offered. Goals set by the patient for the next several months.   Weight /BMI 06/06/2017 05/14/2017 03/14/2017  WEIGHT 214 lb 219 lb 0.6 oz 221 lb  HEIGHT 5\' 5"  - 5\' 5"   BMI 35.61 kg/m2 36.45 kg/m2 36.78 kg/m2  Some encounter information is confidential and restricted. Go to Review Flowsheets activity to see all data.

## 2017-06-09 NOTE — Assessment & Plan Note (Signed)
Controlled, no change in medication DASH diet and commitment to daily physical activity for a minimum of 30 minutes discussed and encouraged, as a part of hypertension management. The importance of attaining a healthy weight is also discussed.  BP/Weight 06/06/2017 05/14/2017 03/14/2017 01/08/2017 12/13/2016 09/11/2016 62/56/3893  Systolic BP 734 - 287 681 157 262 035  Diastolic BP 78 - 82 70 76 80 80  Wt. (Lbs) 214 219.04 221 224 216 227 217  BMI 35.61 36.45 36.78 37.28 35.94 37.77 36.11  Some encounter information is confidential and restricted. Go to Review Flowsheets activity to see all data.

## 2017-06-09 NOTE — Assessment & Plan Note (Signed)
Chronic fatigue , with headaches , hypertension and snoring, pt again referred for sleep study, no recall of being previously referred but now understands and accepts the importance of treatment

## 2017-06-09 NOTE — Assessment & Plan Note (Signed)
Mary Roman is reminded of the importance of commitment to daily physical activity for 30 minutes or more, as able and the need to limit carbohydrate intake to 30 to 60 grams per meal to help with blood sugar control.   The need to take medication as prescribed, test blood sugar as directed, and to call between visits if there is a concern that blood sugar is uncontrolled is also discussed.   Mary Roman is reminded of the importance of daily foot exam, annual eye examination, and good blood sugar, blood pressure and cholesterol control.  Diabetic Labs Latest Ref Rng & Units 01/08/2017 12/12/2016 09/11/2016 07/11/2016 02/01/2016  HbA1c <5.7 % 7.1(H) - - 6.3(H) 6.4(H)  Microalbumin Not estab mg/dL - 2.7 26.9(H) - -  Micro/Creat Ratio <30 mcg/mg creat - 9 - - -  Chol <200 mg/dL 157 - - - -  HDL >50 mg/dL 37(L) - - - -  Calc LDL <100 mg/dL 107(H) - - - -  Triglycerides <150 mg/dL 64 - - - -  Creatinine 0.50 - 1.10 mg/dL 0.88 - - 0.90 0.87   BP/Weight 06/06/2017 05/14/2017 03/14/2017 01/08/2017 12/13/2016 09/11/2016 16/04/9603  Systolic BP 540 - 981 191 478 295 621  Diastolic BP 78 - 82 70 76 80 80  Wt. (Lbs) 214 219.04 221 224 216 227 217  BMI 35.61 36.45 36.78 37.28 35.94 37.77 36.11  Some encounter information is confidential and restricted. Go to Review Flowsheets activity to see all data.   Foot/eye exam completion dates Latest Ref Rng & Units 03/14/2017 01/18/2017  Eye Exam No Retinopathy - No Retinopathy  Foot Form Completion - Done -   Updated lab needed

## 2017-06-09 NOTE — Progress Notes (Signed)
Mary Roman     MRN: [id number]      DOB: [birth year]   HPI [name] is here for follow up and re-evaluation of chronic medical conditions, medication management and review of any available recent lab and radiology data.  Preventive health is updated, specifically  Cancer screening and Immunization.   Questions or concerns regarding consultations or procedures which the PT has had in the interim are  Addressed.Now being followed regularly by psychiatry  The PT denies any adverse reactions to current medications since the last visit. Is tolerating and benefiting from phentermine. She now exercises at least twice weekly and is working on food choice, has lost 3 pounds in the past 2 weeks which is encouraging There are no new concerns.   Feels as though progress is being made, slightly anxious about the upcoming holiday season however There are no specific complaints   ROS Denies recent fever or chills. Denies sinus pressure, nasal congestion, ear pain or sore throat. Denies chest congestion, productive cough or wheezing. Denies chest pains, palpitations and leg swelling Denies abdominal pain, nausea, vomiting,diarrhea or constipation.   Denies dysuria, frequency, hesitancy or incontinence. Denies joint pain, swelling and limitation in mobility. Denies headaches, seizures, numbness, or tingling. Denies depression, anxiety or insomnia. Denies skin break down or rash.   PE  BP 118/78   Pulse 92   Resp 16   Ht 5\' 5"  (1.651 m)   Wt 214 lb (97.1 kg)   SpO2 97%   BMI 35.61 kg/m   Patient alert and oriented and in no cardiopulmonary distress.  HEENT: No facial asymmetry, EOMI,   oropharynx pink and moist.  Neck supple no JVD, no mass.  Chest: Clear to auscultation bilaterally.  CVS: S1, S2 no murmurs, no S3.Regular rate.  ABD: Soft non tender.   Ext: No edema  MS: Adequate  Though reduced ROM spine, normal in shoulders, hips and knees.  Skin: Intact, no  ulcerations or rash noted.  Psych: Good eye contact,flat affect. Memory intact not anxious mildly depressed appearing.  CNS: CN 2-12 intact, power,  normal throughout.no focal deficits noted.   Assessment & Plan Diabetes mellitus type 2 in obese [facility]) [name] is reminded of the importance of commitment to daily physical activity for 30 minutes or more, as able and the need to limit carbohydrate intake to 30 to 60 grams per meal to help with blood sugar control.   The need to take medication as prescribed, test blood sugar as directed, and to call between visits if there is a concern that blood sugar is uncontrolled is also discussed.   [name] is reminded of the importance of daily foot exam, annual eye examination, and good blood sugar, blood pressure and cholesterol control.  Diabetic Labs Latest Ref Rng & Units 01/08/2017 12/12/2016 09/11/2016 07/11/2016 02/01/2016  HbA1c <5.7 % 7.1(H) - - 6.3(H) 6.4(H)  Microalbumin Not estab mg/dL - 2.7 26.9(H) - -  Micro/Creat Ratio <30 mcg/mg creat - 9 - - -  Chol <200 mg/dL 157 - - - -  HDL >50 mg/dL 37(L) - - - -  Calc LDL <100 mg/dL 107(H) - - - -  Triglycerides <150 mg/dL 64 - - - -  Creatinine 0.50 - 1.10 mg/dL 0.88 - - 0.90 0.87   BP/Weight 06/06/2017 05/14/2017 03/14/2017 01/08/2017 12/13/2016 09/11/2016 69/67/8938  Systolic BP 101 - 751 025 852 778 242  Diastolic BP 78 - 82 70 76 80 80  Wt. (Lbs) 214 219.04  221 224 216 227 217  BMI 35.61 36.45 36.78 37.28 35.94 37.77 36.11  Some encounter information is confidential and restricted. Go to Review Flowsheets activity to see all data.   Foot/eye exam completion dates Latest Ref Rng & Units 03/14/2017 01/18/2017  Eye Exam No Retinopathy - No Retinopathy  Foot Form Completion - Done -   Updated lab needed      Essential hypertension Controlled, no change in medication DASH diet and commitment to daily physical activity for a minimum of 30 minutes discussed and encouraged, as a  part of hypertension management. The importance of attaining a healthy weight is also discussed.  BP/Weight 06/06/2017 05/14/2017 03/14/2017 01/08/2017 12/13/2016 09/11/2016 91/79/1505  Systolic BP 697 - 948 016 553 748 270  Diastolic BP 78 - 82 70 76 80 80  Wt. (Lbs) 214 219.04 221 224 216 227 217  BMI 35.61 36.45 36.78 37.28 35.94 37.77 36.11  Some encounter information is confidential and restricted. Go to Review Flowsheets activity to see all data.       Obesity (BMI 30.0-34.9) Improved, continue half phentermine daily Patient re-educated about  the importance of commitment to a  minimum of 150 minutes of exercise per week.  The importance of healthy food choices with portion control discussed. Encouraged to start a food diary, count calories and to consider  joining a support group. Sample diet sheets offered. Goals set by the patient for the next several months.   Weight /BMI 06/06/2017 05/14/2017 03/14/2017  WEIGHT 214 lb 219 lb 0.6 oz 221 lb  HEIGHT 5\' 5"  - 5\' 5"   BMI 35.61 kg/m2 36.45 kg/m2 36.78 kg/m2  Some encounter information is confidential and restricted. Go to Review Flowsheets activity to see all data.      Sleep-disordered breathing Chronic fatigue , with headaches , hypertension and snoring, pt again referred for sleep study, no recall of being previously referred but now understands and accepts the importance of treatment  Encounter for chronic pain management The patient's Controlled Substance registry is reviewed and compliance confirmed. Adequacy of  Pain control and level of function is assessed. Medication dosing is adjusted as deemed appropriate. Twelve weeks of medication is prescribed , patient signs for the script and is provided with a follow up appointment between 11 to 12 weeks .

## 2017-06-10 MED FILL — VIT D2 1.25 MG (50,000 UNIT: 1.25 MG | 84 days supply | Qty: 12 | Fill #0

## 2017-06-12 ENCOUNTER — Other Ambulatory Visit: Payer: Self-pay | Admitting: Family Medicine

## 2017-06-12 MED FILL — BUPROPION HCL XL 300 MG TAB: 300 | 30 days supply | Qty: 30 | Fill #0

## 2017-06-12 MED FILL — DULoxetine HCL 60 MG CPEP: 60 | 30 days supply | Qty: 30 | Fill #0

## 2017-06-12 NOTE — Progress Notes (Signed)
BH MD/PA/NP OP Progress Note  06/17/2017 8:46 AM Mary Roman  MRN:  [id number]  Chief Complaint:  Chief Complaint    Depression; Follow-up; Anxiety     HPI:  Patient presents for follow up appointment for depression.  She states that she has not been doing well for the past two weeks.  She states that her fianc lost his job as he did not go to work for 3 days.  She believes it has been a pattern of him, stating that he  "deliberately sabotage" things. She felt defeated, and felt hurt as it causes another stress for holiday. She could not literally get up from the bed for the past few days, although she made herself come today. She feels also hurt that he does not appear to care even when she is in this condition. Although he is a "good father" for children, he is not a "good provider." She is afraid to talk with him as she wants to "keep family together." She reports hypersomnia and insomnia. She feels fatigue. She has difficulty with concentration. She had passive SI. She feels anxious. She denies panic attacks. She has fair appetite.   Visit Diagnosis:    ICD-10-CM   1. Major depressive disorder, recurrent episode, moderate (HCC) F33.1     Past Psychiatric History:  I have reviewed the patient's psychiatry history in detail and updated the patient record. Outpatient: couple therapist in 2015 Psychiatry admission: denies Previous suicide attempt: denies Past trials of medication: fluoxetine, buspirone, Trazodone ("weird" feeling) temazepam, Xanax History of violence: denies  Past Medical History:  Past Medical History:  Diagnosis Date  . Anxiety   . Chest tightness, discomfort, or pressure 05/2010   Emergency Department   . Chronic back pain    with disc disease   . Diabetes mellitus, type II ([place])   . Headache(784.0)   . Hypertension   . Obesity   . Palpitation   . Thyromegaly    TSH-0.8 and 07/2010    Past Surgical History:  Procedure Laterality Date  . None       Family Psychiatric History:  I have reviewed the patient's family history in detail and updated the patient record.  Family History:  Family History  Problem Relation Age of Onset  . Diabetes Mother   . Hypertension Mother   . Stroke Mother   . Colon cancer Mother   . Cancer Mother 74       colon localized  . Alcohol abuse Mother   . Pneumonia Father   . Hypertension Sister   . Anxiety disorder Sister   . Hypertension Sister   . Leukemia Sister   . Diabetes Maternal Grandmother   . Hypertension Maternal Grandmother   . Hypertension Maternal Grandfather   . Heart attack Maternal Uncle     Social History:  Social History   Socioeconomic History  . Marital status: Single    Spouse name: None  . Number of children: 2  . Years of education: None  . Highest education level: None  Social Needs  . Financial resource strain: None  . Food insecurity - worry: None  . Food insecurity - inability: None  . Transportation needs - medical: None  . Transportation needs - non-medical: None  Occupational History  . Occupation: employed in medical office   Tobacco Use  . Smoking status: Never Smoker  . Smokeless tobacco: Never Used  Substance and Sexual Activity  . Alcohol use: No    Comment: 01-17-2017  per pt no  . Drug use: No    Comment: 01-17-2017 per pt no  . Sexual activity: Yes    Birth control/protection: Other-see comments    Comment: bf had vasectomy  Other Topics Concern  . None  Social History Narrative  . None    Allergies: No Known Allergies  Metabolic Disorder Labs: Lab Results  Component Value Date   HGBA1C 7.1 (H) 01/08/2017   MPG 157 01/08/2017   MPG 134 07/11/2016   No results found for: PROLACTIN Lab Results  Component Value Date   CHOL 157 01/08/2017   TRIG 64 01/08/2017   HDL 37 (L) 01/08/2017   CHOLHDL 4.2 01/08/2017   VLDL 13 01/08/2017   LDLCALC 107 (H) 01/08/2017   LDLCALC 104 09/16/2015   Lab Results  Component Value Date    TSH 0.95 01/08/2017   TSH 1.198 02/25/2014    Therapeutic Level Labs: No results found for: LITHIUM No results found for: VALPROATE No components found for:  CBMZ  Current Medications: Current Outpatient Medications  Medication Sig Dispense Refill  . buPROPion (WELLBUTRIN XL) 300 MG 24 hr tablet Take 1 tablet (300 mg total) daily by mouth. 30 tablet 0  . Cyanocobalamin (VITAMIN B-12 PO) Take by mouth.    . DULoxetine (CYMBALTA) 60 MG capsule Take 1 capsule (60 mg total) daily by mouth. 30 capsule 0  . ergocalciferol (VITAMIN D2) 50000 units capsule Take 1 capsule (50,000 Units total) once a week by mouth. One capsule once weekly 12 capsule 1  . ferrous sulfate 325 (65 FE) MG EC tablet Take 325 mg by mouth 3 (three) times daily with meals.    . gabapentin (NEURONTIN) 100 MG capsule Take 1 capsule (100 mg total) by mouth 3 (three) times daily. (Patient taking differently: Take 100 mg by mouth 3 (three) times daily as needed. ) 90 capsule 1  . HYDROcodone-acetaminophen (NORCO/VICODIN) 5-325 MG tablet One tablet once daily 30 tablet 0  . metFORMIN (GLUCOPHAGE-XR) 500 MG 24 hr tablet Take 1 tablet (500 mg total) by mouth daily with breakfast. 90 tablet 3  . metoprolol tartrate (LOPRESSOR) 25 MG tablet TAKE 1 TABLET BY MOUTH DAILY. 90 tablet 1  . phentermine (ADIPEX-P) 37.5 MG tablet Take 1 tablet (37.5 mg total) by mouth daily before breakfast. 30 tablet 0  . temazepam (RESTORIL) 30 MG capsule TAKE 1 CAPSULE BY MOUTH ONCE DAILY AT BEDTIME AS NEEDED FOR SLEEP 90 capsule 1  . triamterene-hydrochlorothiazide (MAXZIDE-25) 37.5-25 MG tablet TAKE 1 TABLET BY MOUTH DAILY. 90 tablet 1   No current facility-administered medications for this visit.      Musculoskeletal: Strength & Muscle Tone: within normal limits Gait & Station: normal Patient leans: N/A  Psychiatric Specialty Exam: Review of Systems  Psychiatric/Behavioral: Positive for depression. Negative for hallucinations, substance abuse  and suicidal ideas. The patient is nervous/anxious and has insomnia.   All other systems reviewed and are negative.   Blood pressure 124/82, pulse 84, height 5\' 5"  (1.651 m), weight 214 lb (97.1 kg), SpO2 96 %.Body mass index is 35.61 kg/m.  General Appearance: Fairly Groomed  Eye Contact:  Good  Speech:  Clear and Coherent  Volume:  Normal  Mood:  Depressed  Affect:  Appropriate, Congruent, Tearful and down  Thought Process:  Coherent and Goal Directed  Orientation:  Full (Time, Place, and Person)  Thought Content: Logical Perceptions: denies AH/VH  Suicidal Thoughts:  No  Homicidal Thoughts:  No  Memory:  Immediate;   Good Recent;  Good Remote;   Good  Judgement:  Good  Insight:  Fair  Psychomotor Activity:  Normal  Concentration:  Concentration: Good and Attention Span: Good  Recall:  Good  Fund of Knowledge: Good  Language: Good  Akathisia:  No  Handed:  Right  AIMS (if indicated): not done  Assets:  Communication Skills Desire for Improvement  ADL's:  Intact  Cognition: WNL  Sleep:  Poor   Screenings: GAD-7     Office Visit from 01/08/2017 in [facility]  Total GAD-7 Score  14    PHQ2-9     Counselor from 02/14/2017 in [facility] Office Visit from 01/08/2017 in [facility] Office Visit from 12/13/2016 in [facility] Office Visit from 12/20/2014 in [facility] Office Visit from 08/23/2014 in [facility]  PHQ-2 Total Score  5  5  6  2  3   PHQ-9 Total Score  16  20  25  7  12        Assessment and Plan:  Mary Roman is a 46 y.o. year old female with a history of depression, hypertension, chronic back pain , who presents for follow up appointment for Major depressive disorder, recurrent episode, moderate ([place])  # MDD, moderate, recurrent with anxious distress There has been worsening in her mood symptoms in the setting of her fiance lost his job. After having  discussion, will continue the same regimen; will continue duloxetine for depression and wellbutrin as adjunctive treatment for depression. Will consider uptitration of either of this medication if any worsening in her symptoms. She has no known history of seizure, headache. She will continue temazepam prn for insomnia. Discussed effective communication while validating her feeling. Discussed self compassion.   Plan 1. Continue duloxetine 60 mg daily  2. Continue Wellbutrin 300 mg daily 3. Continue temazepam 30 mg at night as needed for sleep 4. Return to clinic in one month for 30 mins - She will continue to see Ms Bynyum for therapy (She is on gabapentin for pain and hydroxyzine)  The patient demonstrates the following risk factors for suicide: Chronic risk factors for suicide include: psychiatric disorder of depression, anxietyand chronic pain. Acute risk factorsfor suicide include: N/A. Protective factorsfor this patient include: positive social support, responsibility to others (children, family), coping skills and hope for the future. Considering these factors, the overall suicide risk at this point appears to be low. Patient isappropriate for outpatient follow up.  The duration of this appointment visit was 30 minutes of face-to-face time with the patient.  Greater than 50% of this time was spent in counseling, explanation of  diagnosis, planning of further management, and coordination of care.  [name], MD 06/17/2017, 8:46 AM

## 2017-06-13 MED FILL — METOPROLOL TARTRATE 25 MG T: 25 | 90 days supply | Qty: 90 | Fill #0

## 2017-06-13 NOTE — Telephone Encounter (Signed)
Seen 11 8 18 

## 2017-06-14 ENCOUNTER — Encounter (HOSPITAL_COMMUNITY): Payer: Self-pay | Admitting: Psychiatry

## 2017-06-14 ENCOUNTER — Ambulatory Visit (HOSPITAL_COMMUNITY): Payer: 59 | Admitting: Psychiatry

## 2017-06-14 DIAGNOSIS — F331 Major depressive disorder, recurrent, moderate: Secondary | ICD-10-CM

## 2017-06-14 NOTE — Progress Notes (Signed)
Patient:  Mary Roman   DOB: January 16, 1971  MR Number: [id number]    Location: [facility]:  [address] [place],  [place], [address]  Start: Friday 06/14/2017 8:20 AM  End: Friday 06/14/2017 9:15 AM   Provider/Observer:     Maurice Small, MSW, LCSW   Chief Complaint:           Depression/anxiety              Reason For Service:     Mary Roman is a 46 y.o. female who is referred for services by psychiatrist Dr. Modesta Messing. Patient reports trying to cope with anxiety, depression, and everyday stress. She states having problems with finances and occasionally work. She says most of her stress is related to not having enough time in the day to do what she needs to do. 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 Patient has 2 children  and a fiance. She is the sole breadwinner for her home. She states having difficulty getting out of bed in the mornings, having to really push herself, having no energy, poor motivation, diminished interest in activities, and sleeping excessively.      Interventions Strategy:  Supportive/CBT/ACT  Participation Level:   Active  Participation Quality:  Appropriate      Behavioral Observation:  Casual, Alert, and tearful, sad    Current Psychosocial Factors: Caretaker responsibilities for mother who had a stroke 5 years ago, financial stress, discord with siblings, job stress, discord in relationship with fiancee, financial stress  Content of Session:   reviewed symptoms, discussed stressors, facilitated expression of thoughts and feelings, validated feelings, discussed patterns in patient's relationship with her fianc and her expectations of the relationship, discussed reasonable expectations versus realistic expectations, assisted patient identify realistic expectations of the relationship with fiance, discussed areas patient can change, assisted patient identify ways to increase behavioral activation using  activities consistent with patient's values,   Current Status:   Low energy depressed mood, poor concentration, feelings of hopelessness, irritability, changes in appetite, sleep difficulty, feelings of worthlessness, tearfulness, anxiety, muscle tension, excessive worrying  Suicidal/Homicidal:   No  Patient Progress:   Fair. Patient last was seen in August 2018. She reports doing fairly well until about 2 weeks ago. Per her report, her fiance got a job about a month ago and patient reports experiencing relief at that time as she anticipated decreased financial stress. However, fiance lost his job 2 weeks ago due to poor attendance. He still works a part-time job but has not sought ways to increase hours. Patient expresses frustration, anger, resentment, hurt, and disappointment. She reports being down for the past 2 weeks. She reports feeling overwhelmed and states feeling defeated as she feels everything is back on her again.   Target Goals:   1. Learn and implement calming strategies to reduce/manage overall anxiety.    2. Learn and implement behavioral strategies to overcome depression.    3.Verbalize an understanding and resolution of current interpersonal problems.    4. Identify and replace thoughts and beliefs that support depression/anxiety.  Last Reviewed:   02/28/2017  Goals Addressed Today:    1,2,3,4  Plan:      Return again in 2  weeks.  Impression/Diagnosis:  Patient presents with a history of symptoms of depression that initially began about 5 years ago after her mother had a stroke. Symptoms have waxed and waned since that time. She and one of her  siblings assumed most of the caretaker responsibilities for her mother. Symptoms worsened in the past couple of months as her sibling who had helped her with mother moved to [place] and no longer is as available to assist patient with their mother's care. Patient also has multiple other stressors. Her symptoms include low energy  depressed mood, poor concentration, feelings of hopelessness, irritability, changes in appetite, sleep difficulty, feelings of worthlessness, tearfulness, anxiety, muscle tension, excessive worrying                    Diagnosis:  Axis I: Major depressive disorder, recurrent, moderate          Axis II: Deferred  Wille Aubuchon, LCSW 06/14/2017

## 2017-06-17 ENCOUNTER — Ambulatory Visit (INDEPENDENT_AMBULATORY_CARE_PROVIDER_SITE_OTHER): Payer: 59 | Admitting: Psychiatry

## 2017-06-17 ENCOUNTER — Encounter (HOSPITAL_COMMUNITY): Payer: Self-pay | Admitting: Psychiatry

## 2017-06-17 VITALS — BP 124/82 | HR 84 | Ht 65.0 in | Wt 214.0 lb

## 2017-06-17 DIAGNOSIS — Z811 Family history of alcohol abuse and dependence: Secondary | ICD-10-CM | POA: Diagnosis not present

## 2017-06-17 DIAGNOSIS — F419 Anxiety disorder, unspecified: Secondary | ICD-10-CM | POA: Diagnosis not present

## 2017-06-17 DIAGNOSIS — R450 Nervousness: Secondary | ICD-10-CM | POA: Diagnosis not present

## 2017-06-17 DIAGNOSIS — F331 Major depressive disorder, recurrent, moderate: Secondary | ICD-10-CM

## 2017-06-17 DIAGNOSIS — G4700 Insomnia, unspecified: Secondary | ICD-10-CM | POA: Diagnosis not present

## 2017-06-17 DIAGNOSIS — Z6379 Other stressful life events affecting family and household: Secondary | ICD-10-CM | POA: Diagnosis not present

## 2017-06-17 MED ORDER — DULOXETINE HCL 60 MG PO CPEP: 60.0000 mg | ORAL_CAPSULE | Freq: Every day | ORAL | 0 refills | Status: DC

## 2017-06-17 MED ORDER — BUPROPION HCL ER (XL) 300 MG PO TB24: 300.0000 mg | ORAL_TABLET | Freq: Every day | ORAL | 0 refills | Status: DC

## 2017-06-17 NOTE — Patient Instructions (Signed)
1. Continue duloxetine 60 mg daily  2. Continue Wellbutrin 300 mg daily 3. Continue temazepam 30 mg at night as needed for sleep 4. Return to clinic in one month for 30 mins

## 2017-06-27 ENCOUNTER — Other Ambulatory Visit: Payer: Self-pay

## 2017-06-27 MED ORDER — PHENTERMINE HCL 37.5 MG PO TABS: 37.5000 mg | ORAL_TABLET | Freq: Every day | ORAL | 1 refills | Status: DC

## 2017-06-27 MED ORDER — TEMAZEPAM 30 MG PO CAPS: ORAL_CAPSULE | ORAL | 1 refills | Status: DC

## 2017-06-27 MED FILL — PHENTERMINE 37.5 MG TABLET: 37.5 | 30 days supply | Qty: 30 | Fill #0

## 2017-06-27 MED FILL — TEMAZEPAM 30 MG CAPSULE: 30 | 90 days supply | Qty: 90 | Fill #0

## 2017-07-04 ENCOUNTER — Ambulatory Visit (HOSPITAL_COMMUNITY): Payer: Self-pay | Admitting: Psychiatry

## 2017-07-09 ENCOUNTER — Ambulatory Visit (HOSPITAL_COMMUNITY): Payer: Self-pay | Admitting: Psychiatry

## 2017-07-09 ENCOUNTER — Telehealth (HOSPITAL_COMMUNITY): Payer: Self-pay | Admitting: *Deleted

## 2017-07-09 NOTE — Telephone Encounter (Signed)
left voice message regarding appointment missed due to weather.

## 2017-07-10 NOTE — Progress Notes (Signed)
BH MD/PA/NP OP Progress Note  07/15/2017 8:21 AM Mary Roman  MRN:  [id number]  Chief Complaint:  Chief Complaint    Depression; Follow-up     HPI:  Patient presents for follow up appointment for depression.  She states that she continues to feel depressed.  She states that it has been especially difficult for her during holiday season.  She tends to think about "not good enough" since childhood. She feels especially frustrated with her fiance, who she still cannot forgive what he had done (laid off from work) before Thanksgiving. She states that she did not sign up for a single parent. She was told by him that she was insensitive to him given he has physical condition. She also thinks that he was insensitive to her in regards to how he acted on her comment.  she feels depressed.  She sleeps well on temazepam.  She feels fatigued.  She has  Low energy.  She has difficulty with concentration.  She has passive SI .  She feels anxious and tense.  She denies panic attacks.   Per PMP,  Temazepam filled on 06/27/2017 by other provider for 90 days   Visit Diagnosis:    ICD-10-CM   1. Major depressive disorder, recurrent episode, moderate (HCC) F33.1     Past Psychiatric History:  I have reviewed the patient's psychiatry history in detail and updated the patient record. Outpatient: couple therapist in 2015 Psychiatry admission: denies Previous suicide attempt: denies Past trials of medication: fluoxetine, buspirone, Trazodone ("weird" feeling) temazepam, Xanax History of violence: denies    Past Medical History:  Past Medical History:  Diagnosis Date  . Anxiety   . Chest tightness, discomfort, or pressure 05/2010   Emergency Department   . Chronic back pain    with disc disease   . Diabetes mellitus, type II ([place])   . Headache(784.0)   . Hypertension   . Obesity   . Palpitation   . Thyromegaly    TSH-0.8 and 07/2010    Past Surgical History:  Procedure Laterality Date   . None      Family Psychiatric History: I have reviewed the patient's family history in detail and updated the patient record.  Family History:  Family History  Problem Relation Age of Onset  . Diabetes Mother   . Hypertension Mother   . Stroke Mother   . Colon cancer Mother   . Cancer Mother 66       colon localized  . Alcohol abuse Mother   . Pneumonia Father   . Hypertension Sister   . Anxiety disorder Sister   . Hypertension Sister   . Leukemia Sister   . Diabetes Maternal Grandmother   . Hypertension Maternal Grandmother   . Hypertension Maternal Grandfather   . Heart attack Maternal Uncle     Social History:  Social History   Socioeconomic History  . Marital status: Single    Spouse name: Not on file  . Number of children: 2  . Years of education: Not on file  . Highest education level: Not on file  Social Needs  . Financial resource strain: Not on file  . Food insecurity - worry: Not on file  . Food insecurity - inability: Not on file  . Transportation needs - medical: Not on file  . Transportation needs - non-medical: Not on file  Occupational History  . Occupation: employed in medical office   Tobacco Use  . Smoking status: Never Smoker  . Smokeless  tobacco: Never Used  Substance and Sexual Activity  . Alcohol use: No    Comment: 01-17-2017 per pt no  . Drug use: No    Comment: 01-17-2017 per pt no  . Sexual activity: Yes    Birth control/protection: Other-see comments    Comment: bf had vasectomy  Other Topics Concern  . Not on file  Social History Narrative  . Not on file    Allergies: No Known Allergies  Metabolic Disorder Labs: Lab Results  Component Value Date   HGBA1C 7.1 (H) 01/08/2017   MPG 157 01/08/2017   MPG 134 07/11/2016   No results found for: PROLACTIN Lab Results  Component Value Date   CHOL 157 01/08/2017   TRIG 64 01/08/2017   HDL 37 (L) 01/08/2017   CHOLHDL 4.2 01/08/2017   VLDL 13 01/08/2017   LDLCALC 107 (H)  01/08/2017   LDLCALC 104 09/16/2015   Lab Results  Component Value Date   TSH 0.95 01/08/2017   TSH 1.198 02/25/2014    Therapeutic Level Labs: No results found for: LITHIUM No results found for: VALPROATE No components found for:  CBMZ  Current Medications: Current Outpatient Medications  Medication Sig Dispense Refill  . buPROPion (WELLBUTRIN XL) 300 MG 24 hr tablet Take 1 tablet (300 mg total) daily by mouth. 30 tablet 0  . Cyanocobalamin (VITAMIN B-12 PO) Take by mouth.    . DULoxetine (CYMBALTA) 60 MG capsule Take 1 capsule (60 mg total) daily by mouth. 30 capsule 0  . ergocalciferol (VITAMIN D2) 50000 units capsule Take 1 capsule (50,000 Units total) once a week by mouth. One capsule once weekly 12 capsule 1  . ferrous sulfate 325 (65 FE) MG EC tablet Take 325 mg by mouth 3 (three) times daily with meals.    . gabapentin (NEURONTIN) 100 MG capsule Take 1 capsule (100 mg total) by mouth 3 (three) times daily. (Patient taking differently: Take 100 mg by mouth 3 (three) times daily as needed. ) 90 capsule 1  . HYDROcodone-acetaminophen (NORCO/VICODIN) 5-325 MG tablet One tablet once daily 30 tablet 0  . metFORMIN (GLUCOPHAGE-XR) 500 MG 24 hr tablet Take 1 tablet (500 mg total) by mouth daily with breakfast. 90 tablet 3  . metoprolol tartrate (LOPRESSOR) 25 MG tablet TAKE 1 TABLET BY MOUTH DAILY. 90 tablet 1  . phentermine (ADIPEX-P) 37.5 MG tablet Take 1 tablet (37.5 mg total) by mouth daily before breakfast. 30 tablet 1  . temazepam (RESTORIL) 30 MG capsule TAKE 1 CAPSULE BY MOUTH ONCE DAILY AT BEDTIME AS NEEDED FOR SLEEP 90 capsule 1  . triamterene-hydrochlorothiazide (MAXZIDE-25) 37.5-25 MG tablet TAKE 1 TABLET BY MOUTH DAILY. 90 tablet 1   No current facility-administered medications for this visit.      Musculoskeletal: Strength & Muscle Tone: within normal limits Gait & Station: normal Patient leans: N/A  Psychiatric Specialty Exam: Review of Systems   Psychiatric/Behavioral: Positive for depression. Negative for hallucinations, memory loss, substance abuse and suicidal ideas. The patient is nervous/anxious and has insomnia.   All other systems reviewed and are negative.   Blood pressure 128/84, pulse 76, height 5\' 5"  (1.651 m), weight 218 lb (98.9 kg), SpO2 98 %.Body mass index is 36.28 kg/m.  General Appearance: Fairly Groomed  Eye Contact:  Good  Speech:  Clear and Coherent  Volume:  Normal  Mood:  Depressed  Affect:  Appropriate, Congruent, Restricted and down  Thought Process:  Coherent and Goal Directed  Orientation:  Full (Time, Place, and Person)  Thought Content: Logical   Suicidal Thoughts:  Yes.  without intent/plan  Homicidal Thoughts:  No  Memory:  Immediate;   Good Recent;   Good Remote;   Good  Judgement:  Good  Insight:  Fair  Psychomotor Activity:  Normal  Concentration:  Concentration: Good and Attention Span: Good  Recall:  Good  Fund of Knowledge: Good  Language: Good  Akathisia:  No  Handed:  Right  AIMS (if indicated): not done  Assets:  Communication Skills Desire for Improvement  ADL's:  Intact  Cognition: WNL  Sleep:  Good on temazepam   Screenings: GAD-7     Office Visit from 01/08/2017 in [facility]  Total GAD-7 Score  14    PHQ2-9     Counselor from 02/14/2017 in [facility] Office Visit from 01/08/2017 in [facility] Office Visit from 12/13/2016 in [facility] Office Visit from 12/20/2014 in [facility] Office Visit from 08/23/2014 in [facility]  PHQ-2 Total Score  5  5  6  2  3   PHQ-9 Total Score  16  20  25  7  12        Assessment and Plan:  GALILEE PIERRON is a 46 y.o. year old female with a history of depression, hypertension,  chronic back pain  , who presents for follow up appointment for Major depressive disorder, recurrent episode, moderate ([place])  # MDD, moderate, recurrent  with anxious distress There has been worsening in her neurovegetative in the setting of discordance with her fiance. Will uptitrate duloxetine to target depression and anxiety.  Will continue Wellbutrin as adjunctive treatment for depression.  She has no known history of seizure, headache.  She will continue temazepam as needed for insomnia, prescribed by her PCP.  Discussed effective communication.  Discussed cognitive diffusion.  Discussed the benefit of couple therapy; she we will talk about this option with her fiance.   Plan I have reviewed and updated plans as below 1. Increase duloxetine 90 mg daily  2. Continue Wellbutrin 300 mg daily  3. Return to clinic in one month for 30 mins 4. Continue temazepam 30 mg at night as needed for sleep - She will continue to see Ms Bynyum for therapy (She is on gabapentin for pain and hydroxyzine)  The patient demonstrates the following risk factors for suicide: Chronic risk factors for suicide include: psychiatric disorder of depression, anxietyand chronic pain. Acute risk factorsfor suicide include: N/A. Protective factorsfor this patient include: positive social support, responsibility to others (children, family), coping skills and hope for the future. Considering these factors, the overall suicide risk at this point appears to be low. Patient isappropriate for outpatient follow up.  The duration of this appointment visit was 30 minutes of face-to-face time with the patient.  Greater than 50% of this time was spent in counseling, explanation of  diagnosis, planning of further management, and coordination of care.   [name], MD 07/15/2017, 8:21 AM

## 2017-07-15 ENCOUNTER — Telehealth (HOSPITAL_COMMUNITY): Payer: Self-pay | Admitting: *Deleted

## 2017-07-15 ENCOUNTER — Ambulatory Visit (HOSPITAL_COMMUNITY): Payer: 59 | Admitting: Psychiatry

## 2017-07-15 VITALS — BP 128/84 | HR 76 | Ht 65.0 in | Wt 218.0 lb

## 2017-07-15 DIAGNOSIS — Z811 Family history of alcohol abuse and dependence: Secondary | ICD-10-CM | POA: Diagnosis not present

## 2017-07-15 DIAGNOSIS — R45851 Suicidal ideations: Secondary | ICD-10-CM | POA: Diagnosis not present

## 2017-07-15 DIAGNOSIS — Z818 Family history of other mental and behavioral disorders: Secondary | ICD-10-CM

## 2017-07-15 DIAGNOSIS — R450 Nervousness: Secondary | ICD-10-CM

## 2017-07-15 DIAGNOSIS — F331 Major depressive disorder, recurrent, moderate: Secondary | ICD-10-CM | POA: Diagnosis not present

## 2017-07-15 DIAGNOSIS — G4700 Insomnia, unspecified: Secondary | ICD-10-CM | POA: Diagnosis not present

## 2017-07-15 DIAGNOSIS — G894 Chronic pain syndrome: Secondary | ICD-10-CM

## 2017-07-15 DIAGNOSIS — I10 Essential (primary) hypertension: Secondary | ICD-10-CM | POA: Diagnosis not present

## 2017-07-15 DIAGNOSIS — F419 Anxiety disorder, unspecified: Secondary | ICD-10-CM | POA: Diagnosis not present

## 2017-07-15 MED ORDER — DULOXETINE HCL 60 MG PO CPEP: 60.0000 mg | ORAL_CAPSULE | Freq: Every day | ORAL | 0 refills | Status: DC

## 2017-07-15 MED ORDER — DULOXETINE HCL 30 MG PO CPEP: 90.0000 mg | ORAL_CAPSULE | Freq: Every day | ORAL | 0 refills | Status: DC

## 2017-07-15 MED ORDER — DULOXETINE HCL 30 MG PO CPEP: 30.0000 mg | ORAL_CAPSULE | Freq: Every day | ORAL | 0 refills | Status: DC

## 2017-07-15 MED ORDER — BUPROPION HCL ER (XL) 300 MG PO TB24: 300.0000 mg | ORAL_TABLET | Freq: Every day | ORAL | 0 refills | Status: DC

## 2017-07-15 MED FILL — DULoxetine HCL 60 MG CPEP: 60 | 30 days supply | Qty: 30 | Fill #0

## 2017-07-15 MED FILL — BUPROPION HCL XL 300 MG TAB: 300 | 30 days supply | Qty: 30 | Fill #0

## 2017-07-15 MED FILL — DULoxetine HCL 30 MG CPEP: 30 | 30 days supply | Qty: 30 | Fill #0

## 2017-07-15 MED FILL — TRIAMTERENE/HCTZ 37.5/25 TB: 37.5-25 | 90 days supply | Qty: 90 | Fill #0

## 2017-07-15 NOTE — Telephone Encounter (Signed)
Dr Modesta Messing, [facility] called for out patient RX stating fax was received to increase Cymbalta from 60 mg to 90 mg.  They do not have the 90 mg . She asked if you would send a 30 day supply for  the 30 mg & 1 for 60 mg to total 90 mg. She will end up paying 2 co-pays per pharmacy.

## 2017-07-15 NOTE — Telephone Encounter (Signed)
ordered

## 2017-07-15 NOTE — Patient Instructions (Signed)
1. Increase duloxetine 90 mg daily  2. Continue Wellbutrin 300 mg daily  3. Return to clinic in one month for 30 mins

## 2017-07-15 NOTE — Addendum Note (Signed)
Addended by: [name] on: 07/15/2017 12:50 PM   Modules accepted: Orders

## 2017-07-18 ENCOUNTER — Ambulatory Visit (INDEPENDENT_AMBULATORY_CARE_PROVIDER_SITE_OTHER): Payer: 59 | Admitting: Psychiatry

## 2017-07-18 ENCOUNTER — Encounter (HOSPITAL_COMMUNITY): Payer: Self-pay | Admitting: Psychiatry

## 2017-07-18 DIAGNOSIS — F331 Major depressive disorder, recurrent, moderate: Secondary | ICD-10-CM | POA: Diagnosis not present

## 2017-07-18 NOTE — Progress Notes (Signed)
Patient:  Mary Roman   DOB: 28-Sep-1970  MR Number: [id number]    Location: [facility]:  [address] [place],  [place], [address]  Start: Thursday 07/18/2017 8:12 AM  End: Thursday 07/18/2017 9:07 AM   Provider/Observer:     Maurice Small, MSW, LCSW   Chief Complaint:           Depression/anxiety              Reason For Service:     Mary Roman is a 46 y.o. female who is referred for services by psychiatrist Dr. Modesta Messing. Patient reports trying to cope with anxiety, depression, and everyday stress. She states having problems with finances and occasionally work. She says most of her stress is related to not having enough time in the day to do what she needs to do. 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 Patient has 2 children  and a fiance. She is the sole breadwinner for her home. She states having difficulty getting out of bed in the mornings, having to really push herself, having no energy, poor motivation, diminished interest in activities, and sleeping excessively.      Interventions Strategy:  Supportive/CBT/ACT  Participation Level:   Active  Participation Quality:  Appropriate      Behavioral Observation:  Casual, Alert, and tearful, sad    Current Psychosocial Factors: Caretaker responsibilities for mother who had a stroke 5 years ago, financial stress, discord with siblings, job stress, discord in relationship with fiancee, financial stress  Content of Session:   reviewed symptoms, discussed stressors, facilitated expression of thoughts and feelings, validated feelings, discussed patterns in patient's relationship with her fiancee, assisted patient identify how she is relating to her thoughts and feelings and the effects on her current functioning as well as the effects on her relationship with her children, assisted patient identify ways to increase behavioral activation using activities consistent with patient's values  which include being more involved and active with her children.  Current Status:   Low energy depressed mood, poor concentration, feelings of hopelessness, irritability, changes in appetite, sleep difficulty, feelings of worthlessness, tearfulness, anxiety, muscle tension, excessive worrying  Suicidal/Homicidal:   No  Patient Progress:   Fair. Patient last was seen a month ago. She reports little to no change in symptoms since last session. She expresses continued frustration with her fiancee and worries about finances. She reports trying to talk with fiancee about their issues but states he constantly throw up the past and blames her for their situation. She reports being depressed and anxious about the holidays. She  worries about how her functioning is affecting her children.   Target Goals:   1. Learn and implement calming strategies to reduce/manage overall anxiety.    2. Learn and implement behavioral strategies to overcome depression.    3.Verbalize an understanding and resolution of current interpersonal problems.    4. Identify and replace thoughts and beliefs that support depression/anxiety.  Last Reviewed:   02/28/2017  Goals Addressed Today:    1,2,3,4  Plan:      Return again in 2  weeks.  Impression/Diagnosis:  Patient presents with a history of symptoms of depression that initially began about 5 years ago after her mother had a stroke. Symptoms have waxed and waned since that time. She and one of her siblings assumed most of the caretaker responsibilities for her mother. Symptoms worsened in the past couple of months  as her sibling who had helped her with mother moved to [place] and no longer is as available to assist patient with their mother's care. Patient also has multiple other stressors. Her symptoms include low energy depressed mood, poor concentration, feelings of hopelessness, irritability, changes in appetite, sleep difficulty, feelings of worthlessness, tearfulness,  anxiety, muscle tension, excessive worrying                    Diagnosis:  Axis I: Major depressive disorder, recurrent, moderate          Axis II: Deferred  BYNUM,PEGGY, LCSW 07/18/2017

## 2017-08-05 ENCOUNTER — Ambulatory Visit (HOSPITAL_COMMUNITY): Payer: Self-pay | Admitting: Psychiatry

## 2017-08-14 NOTE — Progress Notes (Signed)
BH MD/PA/NP OP Progress Note  08/19/2017 8:17 AM Mary Roman  MRN:  [id number]  Chief Complaint:  Chief Complaint    Depression; Follow-up     HPI:  Patient presents for follow up appointment for depression.  She states that she has not had so many "melt down" as she used to since the last appointment. Her fiance was admitted for pancreatitis. She partially attributes it to his non adherence to his care. She tried to communicate with him by let him talk what he thinks. She has good relationship with her daughter and enjoyed shopping the other day; which the have not done for a while. She feels frustrated that other family member does not appreciate her trying to set boundaries.  She tends to be more anxious at work and when she deals with things about her mother.  She has less insomnia.  She occasionally has appetite loss and binge eating.  She has fair concentration.  She feels less fatigued.  She has less anhedonia.  She denies SI.  She feels anxious and tense at times.   Wt Readings from Last 3 Encounters:  08/19/17 212 lb (96.2 kg)  07/15/17 218 lb (98.9 kg)  06/17/17 214 lb (97.1 kg)    PerPMP,  On hydrocodone, temazepam, phentermine filled by other provider  Visit Diagnosis:    ICD-10-CM   1. Major depressive disorder, recurrent episode, moderate (HCC) F33.1     Past Psychiatric History:  I have reviewed the patient's psychiatry history in detail and updated the patient record. Outpatient: couple therapist in 2015 Psychiatry admission: denies Previous suicide attempt: denies Past trials of medication: fluoxetine, buspirone, Trazodone ("weird" feeling) temazepam, Xanax History of violence: denies    Past Medical History:  Past Medical History:  Diagnosis Date  . Anxiety   . Chest tightness, discomfort, or pressure 05/2010   Emergency Department   . Chronic back pain    with disc disease   . Diabetes mellitus, type II ([place])   . Headache(784.0)   . Hypertension    . Obesity   . Palpitation   . Thyromegaly    TSH-0.8 and 07/2010    Past Surgical History:  Procedure Laterality Date  . None      Family Psychiatric History:  I have reviewed the patient's family history in detail and updated the patient record.  Family History:  Family History  Problem Relation Age of Onset  . Diabetes Mother   . Hypertension Mother   . Stroke Mother   . Colon cancer Mother   . Cancer Mother 104       colon localized  . Alcohol abuse Mother   . Pneumonia Father   . Hypertension Sister   . Anxiety disorder Sister   . Hypertension Sister   . Leukemia Sister   . Diabetes Maternal Grandmother   . Hypertension Maternal Grandmother   . Hypertension Maternal Grandfather   . Heart attack Maternal Uncle     Social History:  Social History   Socioeconomic History  . Marital status: Single    Spouse name: None  . Number of children: 2  . Years of education: None  . Highest education level: None  Social Needs  . Financial resource strain: None  . Food insecurity - worry: None  . Food insecurity - inability: None  . Transportation needs - medical: None  . Transportation needs - non-medical: None  Occupational History  . Occupation: employed in medical office   Tobacco Use  .  Smoking status: Never Smoker  . Smokeless tobacco: Never Used  Substance and Sexual Activity  . Alcohol use: No    Comment: 01-17-2017 per pt no  . Drug use: No    Comment: 01-17-2017 per pt no  . Sexual activity: Yes    Birth control/protection: Other-see comments    Comment: bf had vasectomy  Other Topics Concern  . None  Social History Narrative  . None    Allergies: No Known Allergies  Metabolic Disorder Labs: Lab Results  Component Value Date   HGBA1C 7.1 (H) 01/08/2017   MPG 157 01/08/2017   MPG 134 07/11/2016   No results found for: PROLACTIN Lab Results  Component Value Date   CHOL 157 01/08/2017   TRIG 64 01/08/2017   HDL 37 (L) 01/08/2017   CHOLHDL  4.2 01/08/2017   VLDL 13 01/08/2017   LDLCALC 107 (H) 01/08/2017   LDLCALC 104 09/16/2015   Lab Results  Component Value Date   TSH 0.95 01/08/2017   TSH 1.198 02/25/2014    Therapeutic Level Labs: No results found for: LITHIUM No results found for: VALPROATE No components found for:  CBMZ  Current Medications: Current Outpatient Medications  Medication Sig Dispense Refill  . buPROPion (WELLBUTRIN XL) 300 MG 24 hr tablet Take 1 tablet (300 mg total) by mouth daily. 30 tablet 0  . Cyanocobalamin (VITAMIN B-12 PO) Take by mouth.    . DULoxetine (CYMBALTA) 30 MG capsule Take 1 capsule (30 mg total) by mouth daily. 30 capsule 0  . DULoxetine (CYMBALTA) 60 MG capsule Take 1 capsule (60 mg total) by mouth daily. Take total of 90 mg daily (60 mg + 30 mg) 30 capsule 0  . ergocalciferol (VITAMIN D2) 50000 units capsule Take 1 capsule (50,000 Units total) once a week by mouth. One capsule once weekly 12 capsule 1  . ferrous sulfate 325 (65 FE) MG EC tablet Take 325 mg by mouth 3 (three) times daily with meals.    . gabapentin (NEURONTIN) 100 MG capsule Take 1 capsule (100 mg total) by mouth 3 (three) times daily. (Patient taking differently: Take 100 mg by mouth 3 (three) times daily as needed. ) 90 capsule 1  . HYDROcodone-acetaminophen (NORCO/VICODIN) 5-325 MG tablet One tablet once daily 30 tablet 0  . metFORMIN (GLUCOPHAGE-XR) 500 MG 24 hr tablet Take 1 tablet (500 mg total) by mouth daily with breakfast. 90 tablet 3  . metoprolol tartrate (LOPRESSOR) 25 MG tablet TAKE 1 TABLET BY MOUTH DAILY. 90 tablet 1  . phentermine (ADIPEX-P) 37.5 MG tablet Take 1 tablet (37.5 mg total) by mouth daily before breakfast. 30 tablet 1  . temazepam (RESTORIL) 30 MG capsule TAKE 1 CAPSULE BY MOUTH ONCE DAILY AT BEDTIME AS NEEDED FOR SLEEP 90 capsule 1  . triamterene-hydrochlorothiazide (MAXZIDE-25) 37.5-25 MG tablet TAKE 1 TABLET BY MOUTH DAILY. 90 tablet 1   No current facility-administered medications for  this visit.      Musculoskeletal: Strength & Muscle Tone: within normal limits Gait & Station: normal Patient leans: N/A  Psychiatric Specialty Exam: Review of Systems  Psychiatric/Behavioral: Positive for depression. Negative for hallucinations, memory loss, substance abuse and suicidal ideas. The patient is nervous/anxious and has insomnia.   All other systems reviewed and are negative.   Blood pressure 127/77, pulse 97, height 5\' 5"  (1.651 m), weight 212 lb (96.2 kg), SpO2 97 %.Body mass index is 35.28 kg/m.  General Appearance: Fairly Groomed  Eye Contact:  Good  Speech:  Clear and Coherent  Volume:  Normal  Mood:  "better"  Affect:  Appropriate, Congruent, Restricted and down  Thought Process:  Coherent and Goal Directed  Orientation:  Full (Time, Place, and Person)  Thought Content: Logical   Suicidal Thoughts:  No  Homicidal Thoughts:  No  Memory:  Immediate;   Good Recent;   Good Remote;   Good  Judgement:  Good  Insight:  Fair  Psychomotor Activity:  Normal  Concentration:  Concentration: Good and Attention Span: Good  Recall:  Good  Fund of Knowledge: Good  Language: Good  Akathisia:  No  Handed:  Right  AIMS (if indicated): not done  Assets:  Communication Skills Desire for Improvement  ADL's:  Intact  Cognition: WNL  Sleep:  Poor   Screenings: GAD-7     Office Visit from 01/08/2017 in [facility]  Total GAD-7 Score  14    PHQ2-9     Counselor from 02/14/2017 in [facility] Office Visit from 01/08/2017 in [facility] Office Visit from 12/13/2016 in [facility] Office Visit from 12/20/2014 in [facility] Office Visit from 08/23/2014 in [facility]  PHQ-2 Total Score  5  5  6  2  3   PHQ-9 Total Score  16  20  25  7  12        Assessment and Plan:  Mary Roman is a 47 y.o. year old female with a history of depression, hypertension, chronic back  pain, who presents for follow up appointment for Major depressive disorder, recurrent episode, moderate ([place])  # MDD, moderate, recurrent with anxious distress There has been overall improvement in neurovegetative symptoms since up titration of duloxetine.  Psychosocial stressors including discordance with her fianc.  Will continue current dose of duloxetine to target depression and anxiety.  Will continue Wellbutrin as adjunctive treatment for depression.  She has no known history of seizure or headache.  She is on temazepam as needed for insomnia, prescribed by PCP.  Discussed effective communication.  Discussed healthy boundary.  Discussed behavioral activation.  She states that she has   Plan I have reviewed and updated plans as below 1. Continue duloxetine 90 mg daily  2. Continue Wellbutrin 300 mg daily  3. Return to clinic in two months for 30 mins 4. Try to take a walk regularly 5. Continue temazepam 30 mg at night as needed for sleep - She will continue to see Ms Bynyum for therapy (She is on gabapentin for pain and hydroxyzine)  The patient demonstrates the following risk factors for suicide: Chronic risk factors for suicide include: psychiatric disorder of depression, anxietyand chronic pain. Acute risk factorsfor suicide include: N/A. Protective factorsfor this patient include: positive social support, responsibility to others (children, family), coping skills and hope for the future. Considering these factors, the overall suicide risk at this point appears to be low. Patient isappropriate for outpatient follow up.  The duration of this appointment visit was 30 minutes of face-to-face time with the patient.  Greater than 50% of this time was spent in counseling, explanation of  diagnosis, planning of further management, and coordination of care.  [name], MD 08/19/2017, 8:17 AM

## 2017-08-19 ENCOUNTER — Encounter (HOSPITAL_COMMUNITY): Payer: Self-pay | Admitting: Psychiatry

## 2017-08-19 ENCOUNTER — Ambulatory Visit (INDEPENDENT_AMBULATORY_CARE_PROVIDER_SITE_OTHER): Payer: No Typology Code available for payment source | Admitting: Psychiatry

## 2017-08-19 VITALS — BP 127/77 | HR 97 | Ht 65.0 in | Wt 212.0 lb

## 2017-08-19 DIAGNOSIS — Z818 Family history of other mental and behavioral disorders: Secondary | ICD-10-CM | POA: Diagnosis not present

## 2017-08-19 DIAGNOSIS — F331 Major depressive disorder, recurrent, moderate: Secondary | ICD-10-CM | POA: Diagnosis not present

## 2017-08-19 DIAGNOSIS — R450 Nervousness: Secondary | ICD-10-CM | POA: Diagnosis not present

## 2017-08-19 DIAGNOSIS — Z811 Family history of alcohol abuse and dependence: Secondary | ICD-10-CM

## 2017-08-19 DIAGNOSIS — F419 Anxiety disorder, unspecified: Secondary | ICD-10-CM

## 2017-08-19 DIAGNOSIS — G4700 Insomnia, unspecified: Secondary | ICD-10-CM | POA: Diagnosis not present

## 2017-08-19 MED ORDER — DULOXETINE HCL 30 MG PO CPEP: 30.0000 mg | ORAL_CAPSULE | Freq: Every day | ORAL | 0 refills | Status: DC

## 2017-08-19 MED ORDER — BUPROPION HCL ER (XL) 300 MG PO TB24: 300.0000 mg | ORAL_TABLET | Freq: Every day | ORAL | 0 refills | Status: DC

## 2017-08-19 MED ORDER — DULOXETINE HCL 60 MG PO CPEP: 60.0000 mg | ORAL_CAPSULE | Freq: Every day | ORAL | 0 refills | Status: DC

## 2017-08-19 MED FILL — BUPROPION HCL XL 300 MG TAB: 300 | 30 days supply | Qty: 30 | Fill #0

## 2017-08-19 MED FILL — PHENTERMINE 37.5 MG TABLET: 37.5 | 30 days supply | Qty: 30 | Fill #1

## 2017-08-19 MED FILL — DULoxetine HCL 60 MG CPEP: 60 | 30 days supply | Qty: 30 | Fill #0

## 2017-08-19 MED FILL — DULoxetine HCL 30 MG CPEP: 30 | 90 days supply | Qty: 90 | Fill #0

## 2017-08-19 NOTE — Patient Instructions (Addendum)
1. Continue duloxetine 90 mg daily  2. Continue Wellbutrin 300 mg daily  3. Return to clinic in two months for 30 mins 4. Try to take a walk regularly

## 2017-08-22 ENCOUNTER — Ambulatory Visit (HOSPITAL_COMMUNITY): Payer: Self-pay | Admitting: Psychiatry

## 2017-08-26 ENCOUNTER — Ambulatory Visit: Payer: Self-pay | Admitting: Family Medicine

## 2017-09-02 ENCOUNTER — Encounter: Payer: Self-pay | Admitting: Family Medicine

## 2017-09-02 ENCOUNTER — Ambulatory Visit (INDEPENDENT_AMBULATORY_CARE_PROVIDER_SITE_OTHER): Payer: Self-pay | Admitting: Family Medicine

## 2017-09-02 VITALS — BP 130/84 | HR 83 | Resp 16 | Ht 65.0 in | Wt 212.0 lb

## 2017-09-02 DIAGNOSIS — I10 Essential (primary) hypertension: Secondary | ICD-10-CM

## 2017-09-02 DIAGNOSIS — E669 Obesity, unspecified: Secondary | ICD-10-CM

## 2017-09-02 DIAGNOSIS — F331 Major depressive disorder, recurrent, moderate: Secondary | ICD-10-CM

## 2017-09-02 DIAGNOSIS — E119 Type 2 diabetes mellitus without complications: Secondary | ICD-10-CM

## 2017-09-02 DIAGNOSIS — E785 Hyperlipidemia, unspecified: Secondary | ICD-10-CM

## 2017-09-02 DIAGNOSIS — Z6835 Body mass index (BMI) 35.0-35.9, adult: Secondary | ICD-10-CM

## 2017-09-02 DIAGNOSIS — G8929 Other chronic pain: Secondary | ICD-10-CM

## 2017-09-02 DIAGNOSIS — D539 Nutritional anemia, unspecified: Secondary | ICD-10-CM

## 2017-09-02 DIAGNOSIS — E66811 Obesity, class 1: Secondary | ICD-10-CM

## 2017-09-02 DIAGNOSIS — E1169 Type 2 diabetes mellitus with other specified complication: Secondary | ICD-10-CM

## 2017-09-02 LAB — BASIC METABOLIC PANEL WITH GFR
BUN: 13 mg/dL (ref 7–25)
CO2: 35 mmol/L — ABNORMAL HIGH (ref 20–32)
CREATININE: 0.93 mg/dL (ref 0.50–1.10)
Calcium: 9.3 mg/dL (ref 8.6–10.2)
Chloride: 100 mmol/L (ref 98–110)
GFR, EST AFRICAN AMERICAN: 85 mL/min/{1.73_m2} (ref >=60)
GFR, Est Non African American: 73 mL/min/{1.73_m2} (ref >=60)
Glucose, Bld: 119 mg/dL — ABNORMAL HIGH (ref 65–99)
Potassium: 3.4 mmol/L — ABNORMAL LOW (ref 3.5–5.3)
Sodium: 141 mmol/L (ref 135–146)

## 2017-09-02 LAB — HEMOGLOBIN A1C
EAG (MMOL/L): 8.1 (calc)
HEMOGLOBIN A1C: 6.7 %{Hb} — AB (ref <5.7)
Mean Plasma Glucose: 146 (calc)

## 2017-09-02 MED ORDER — HYDROCODONE-ACETAMINOPHEN 5-325 MG PO TABS: ORAL_TABLET | ORAL | 0 refills | Status: DC

## 2017-09-02 MED ORDER — PHENTERMINE HCL 37.5 MG PO TABS: 37.5000 mg | ORAL_TABLET | Freq: Every day | ORAL | 2 refills | Status: DC

## 2017-09-02 MED ORDER — GLIPIZIDE ER 2.5 MG PO TB24: 2.5000 mg | ORAL_TABLET | Freq: Every day | ORAL | 1 refills | Status: DC

## 2017-09-02 MED FILL — glipiZIDE ER 2.5 MG TB24: 2.5 | 90 days supply | Qty: 90 | Fill #0

## 2017-09-02 NOTE — Patient Instructions (Addendum)
Start glipizide xL 2.5 mg one every morning, stop metformin Test and record blood sugar every mornong, should be between 80 to 110  It is important that you exercise regularly at least 30 minutes7 times a week. If you develop chest pain, have severe difficulty breathing, or feel very tired, stop exercising immediately and seek medical attention    Record on calender  And return calender  Fasting lipid, cmp and eGFr, HBA1C, vit D CBC, iron and ferritin and B12 and folate level3 days before next visit   Start iron tabs 325 mg  one 3 times daily, this is OTC, call in for this  Water 64 ounces daily  Sleep 6 hrs at least daily  No change in pain meds  Phentermine as before  NO candy, or flour products

## 2017-09-02 NOTE — Progress Notes (Signed)
Fill Date ID Written Drug Qty Days Prescriber Rx # Pharmacy Refill Daily Dose * Pymt Type PMP  08/19/2017  2   06/27/2017  Phentermine 37.5 MG Tablet  30 30 Ma Sim  [id number] [facility] ([id number])  1  Other  [place]  08/13/2017  1   06/06/2017  Hydrocodone-Acetamin 5-325 MG  30 30 Ma Sim  [id number] [facility] ([id number])  0 5.00 MME  Comm Ins  [place]  07/12/2017  1   06/06/2017  Hydrocodone-Acetamin 5-325 MG  30 30 Ma Sim  [id number] [facility] ([id number])  0 5.00 MME  Comm Ins  [place]  06/27/2017  2   06/27/2017  Temazepam 30 MG Capsule  90 90 Ma Sim  [id number] [facility] ([id number])  0 1.50 LME  Comm Ins  [place]  06/27/2017  2   06/27/2017  Phentermine 37.5 MG Tablet  30 30 Ma Sim  [id number] [facility] ([id number])  0  Other  [place]  06/12/2017  1   06/06/2017  Hydrocodone-Acetamin 5-325 MG             Reviewed at visit

## 2017-09-02 NOTE — Progress Notes (Signed)
Mary Roman     MRN: [id number]      DOB: [birth year]   HPI [name] is here for follow up and re-evaluation of chronic medical conditions, medication management and review of any available recent lab and radiology data.  Preventive health is updated, specifically  Cancer screening and Immunization.   Questions or concerns regarding consultations or procedures which the PT has had in the interim are  addressed. Not taking metformin , does not tolerate.  Working on her increase work stress, benefiting from mental health support Denies polyuria, polydipsia, blurred vision , or hypoglycemic episodes.   ROS Denies recent fever or chills. Denies sinus pressure, nasal congestion, ear pain or sore throat. Denies chest congestion, productive cough or wheezing. Denies chest pains, palpitations and leg swelling Denies abdominal pain, nausea, vomiting,diarrhea or constipation.   Denies dysuria, frequency, hesitancy or incontinence. Denies uncontrolled  joint pain, swelling and limitation in mobility. Denies headaches, seizures, numbness, or tingling. Denies  Uncontrolled depression, anxiety or insomnia. Denies skin break down or rash.   PE  BP 130/84   Pulse 83   Resp 16   Ht 5\' 5"  (1.651 m)   Wt 212 lb (96.2 kg)   SpO2 97%   BMI 35.28 kg/m   Patient alert and oriented and in no cardiopulmonary distress.  HEENT: No facial asymmetry, EOMI,   oropharynx pink and moist.  Neck supple no JVD, no mass.  Chest: Clear to auscultation bilaterally.  CVS: S1, S2 no murmurs, no S3.Regular rate.  ABD: Soft non tender.   Ext: No edema  MS: Adequate though reduced  ROM spine, normal in shoulders, hips and knees.  Skin: Intact, no ulcerations or rash noted.  Psych: Good eye contact, normal affect. Memory intact not anxious or depressed appearing.  CNS: CN 2-12 intact, power,  normal throughout.no focal deficits noted.   Assessment & Plan  Diabetes mellitus type 2 in  [facility]) [name] is reminded of the importance of commitment to daily physical activity for 30 minutes or more, as able and the need to limit carbohydrate intake to 30 to 60 grams per meal to help with blood sugar control.   The need to take medication as prescribed, test blood sugar as directed, and to call between visits if there is a concern that blood sugar is uncontrolled is also discussed.   [name] is reminded of the importance of daily foot exam, annual eye examination, and good blood sugar, blood pressure and cholesterol control.  Diabetic Labs Latest Ref Rng & Units 08/31/2017 01/08/2017 12/12/2016 09/11/2016 07/11/2016  HbA1c <5.7 % of total Hgb 6.7(H) 7.1(H) - - 6.3(H)  Microalbumin Not estab mg/dL - - 2.7 26.9(H) -  Micro/Creat Ratio <30 mcg/mg creat - - 9 - -  Chol <200 mg/dL - 157 - - -  HDL >50 mg/dL - 37(L) - - -  Calc LDL <100 mg/dL - 107(H) - - -  Triglycerides <150 mg/dL - 64 - - -  Creatinine 0.50 - 1.10 mg/dL 0.93 0.88 - - 0.90   BP/Weight 09/02/2017 06/06/2017 05/14/2017 03/14/2017 01/08/2017 12/13/2016 3/71/6967  Systolic BP 893 810 - 175 102 585 277  Diastolic BP 84 78 - 82 70 76 80  Wt. (Lbs) 212 214 219.04 221 224 216 227  BMI 35.28 35.61 36.45 36.78 37.28 35.94 37.77  Some encounter information is confidential and restricted. Go to Review Flowsheets activity to see all data.   Foot/eye exam completion dates Latest Ref Rng &  Units 03/14/2017 01/18/2017  Eye Exam No Retinopathy - No Retinopathy  Foot Form Completion - Done -      Improved, change to glipizide   Encounter for chronic pain management The patient's Controlled Substance registry is reviewed and compliance confirmed. Adequacy of  Pain control and level of function is assessed. Medication dosing is adjusted as deemed appropriate. Twelve weeks of medication is prescribed , patient signs for the script and is provided with a follow up appointment between 11 to 12 weeks .   Essential  hypertension Controlled, no change in medication DASH diet and commitment to daily physical activity for a minimum of 30 minutes discussed and encouraged, as a part of hypertension management. The importance of attaining a healthy weight is also discussed.  BP/Weight 09/02/2017 06/06/2017 05/14/2017 03/14/2017 01/08/2017 12/13/2016 03/09/314  Systolic BP 945 859 - 292 446 286 381  Diastolic BP 84 78 - 82 70 76 80  Wt. (Lbs) 212 214 219.04 221 224 216 227  BMI 35.28 35.61 36.45 36.78 37.28 35.94 37.77  Some encounter information is confidential and restricted. Go to Review Flowsheets activity to see all data.       Obesity (BMI 30.0-34.9) Improved, continue half phentermine daily. Patient re-educated about  the importance of commitment to a  minimum of 150 minutes of exercise per week.  The importance of healthy food choices with portion control discussed. Encouraged to start a food diary, count calories and to consider  joining a support group. Sample diet sheets offered. Goals set by the patient for the next several months.   Weight /BMI 09/02/2017 06/06/2017 05/14/2017  WEIGHT 212 lb 214 lb 219 lb 0.6 oz  HEIGHT 5\' 5"  5\' 5"  -  BMI 35.28 kg/m2 35.61 kg/m2 36.45 kg/m2  Some encounter information is confidential and restricted. Go to Review Flowsheets activity to see all data.      Dyslipidemia (high LDL; low HDL) Hyperlipidemia:Low fat diet discussed and encouraged.   Lipid Panel  Lab Results  Component Value Date   CHOL 157 01/08/2017   HDL 37 (L) 01/08/2017   LDLCALC 107 (H) 01/08/2017   TRIG 64 01/08/2017   CHOLHDL 4.2 01/08/2017  Updated lab needed at/ before next visit.     Major depressive disorder, recurrent episode, moderate (HCC) Improved, managed by psychiatry

## 2017-09-03 ENCOUNTER — Encounter: Payer: Self-pay | Admitting: Family Medicine

## 2017-09-05 ENCOUNTER — Ambulatory Visit (HOSPITAL_COMMUNITY): Payer: No Typology Code available for payment source | Admitting: Psychiatry

## 2017-09-05 ENCOUNTER — Encounter (HOSPITAL_COMMUNITY): Payer: Self-pay | Admitting: Psychiatry

## 2017-09-05 DIAGNOSIS — F331 Major depressive disorder, recurrent, moderate: Secondary | ICD-10-CM

## 2017-09-05 NOTE — Progress Notes (Signed)
Patient:  Mary Roman   DOB: 10-18-1970  MR Number: [id number]    Location: [place]:  [address] [place],  [place], [address]          Start: Thursday 09/05/2017 8:07 AM  End: Thursday 09/05/2017 8:59 AM   Provider/Observer:     Maurice Small, MSW, LCSW   Chief Complaint:           Depression/anxiety              Reason For Service:     Mary Roman is a 47 y.o. female who is referred for services by psychiatrist Dr. Modesta Messing. Patient reports trying to cope with anxiety, depression, and everyday stress. She states having problems with finances and occasionally work. She says most of her stress is related to not having enough time in the day to do what she needs to do. 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 Patient has 2 children  and a fiance. She is the sole breadwinner for her home. She states having difficulty getting out of bed in the mornings, having to really push herself, having no energy, poor motivation, diminished interest in activities, and sleeping excessively.      Interventions Strategy:  Supportive/CBT/ACT  Participation Level:   Active  Participation Quality:  Appropriate      Behavioral Observation:  Casual, Alert, and tearful, sad    Current Psychosocial Factors: Caretaker responsibilities for mother who had a stroke 5 years ago, financial stress, discord with siblings, job stress, discord in relationship with fiancee, financial stress  Content of Session:   reviewed symptoms, discussed stressors, facilitated expression of thoughts and feelings, validated feelings, discussed patterns in patient's relationship with her co-worker and her manager, assisted patient identify ways to set/ maintain boundaries in the relationship, assisted patient identify and replace thoughts  that inhibit effective assertion with healthy alternatives   Current Status:   , anxiety, muscle tension, excessive  worrying  Suicidal/Homicidal:   No  Patient Progress:   Patient last was seen 6-7 weeks ago. She reports increased stress and anxiety regarding her job. Per patient's report, her co-worker has been absent excessively in the past two months. This has placed a great deal of stress on patient as there is no one available to cover and patient falls behind on her work trying to fulfill the responsibilities for both positions. She has had assertive communication with co-worker but her pattern of absences remain the same. Patient also has talked with her manager about the situation but manager has nt addressed per patient's report. Patient states feeling caught in the middle.    Target Goals:   1. Learn and implement calming strategies to reduce/manage overall anxiety.    2. Learn and implement behavioral strategies to overcome depression.    3.Verbalize an understanding and resolution of current interpersonal problems.    4. Identify and replace thoughts and beliefs that support depression/anxiety.  Last Reviewed:   02/28/2017  Goals Addressed Today:    1,2,3,4  Plan:      Return again in 2  weeks.  Impression/Diagnosis:  Patient presents with a history of symptoms of depression that initially began about 5 years ago after her mother had a stroke. Symptoms have waxed and waned since that time. She and one of her siblings assumed most of the caretaker responsibilities for her mother. Symptoms worsened in the past couple of months as her sibling who had  helped her with mother moved to [place] and no longer is as available to assist patient with their mother's care. Patient also has multiple other stressors. Her symptoms include low energy depressed mood, poor concentration, feelings of hopelessness, irritability, changes in appetite, sleep difficulty, feelings of worthlessness, tearfulness, anxiety, muscle tension, excessive worrying                    Diagnosis:  Axis I: Major depressive disorder,  recurrent, moderate          Axis II: Deferred  Mei Suits, LCSW 09/05/2017

## 2017-09-08 ENCOUNTER — Encounter: Payer: Self-pay | Admitting: Family Medicine

## 2017-09-08 NOTE — Assessment & Plan Note (Signed)
The patient's Controlled Substance registry is reviewed and compliance confirmed. Adequacy of  Pain control and level of function is assessed. Medication dosing is adjusted as deemed appropriate. Twelve weeks of medication is prescribed , patient signs for the script and is provided with a follow up appointment between 11 to 12 weeks .  

## 2017-09-08 NOTE — Assessment & Plan Note (Signed)
Mary Roman is reminded of the importance of commitment to daily physical activity for 30 minutes or more, as able and the need to limit carbohydrate intake to 30 to 60 grams per meal to help with blood sugar control.   The need to take medication as prescribed, test blood sugar as directed, and to call between visits if there is a concern that blood sugar is uncontrolled is also discussed.   Mary Roman is reminded of the importance of daily foot exam, annual eye examination, and good blood sugar, blood pressure and cholesterol control.  Diabetic Labs Latest Ref Rng & Units 08/31/2017 01/08/2017 12/12/2016 09/11/2016 07/11/2016  HbA1c <5.7 % of total Hgb 6.7(H) 7.1(H) - - 6.3(H)  Microalbumin Not estab mg/dL - - 2.7 26.9(H) -  Micro/Creat Ratio <30 mcg/mg creat - - 9 - -  Chol <200 mg/dL - 157 - - -  HDL >50 mg/dL - 37(L) - - -  Calc LDL <100 mg/dL - 107(H) - - -  Triglycerides <150 mg/dL - 64 - - -  Creatinine 0.50 - 1.10 mg/dL 0.93 0.88 - - 0.90   BP/Weight 09/02/2017 06/06/2017 05/14/2017 03/14/2017 01/08/2017 12/13/2016 0/09/4915  Systolic BP 915 056 - 979 480 165 537  Diastolic BP 84 78 - 82 70 76 80  Wt. (Lbs) 212 214 219.04 221 224 216 227  BMI 35.28 35.61 36.45 36.78 37.28 35.94 37.77  Some encounter information is confidential and restricted. Go to Review Flowsheets activity to see all data.   Foot/eye exam completion dates Latest Ref Rng & Units 03/14/2017 01/18/2017  Eye Exam No Retinopathy - No Retinopathy  Foot Form Completion - Done -      Improved, change to glipizide

## 2017-09-08 NOTE — Assessment & Plan Note (Signed)
Hyperlipidemia:Low fat diet discussed and encouraged.   Lipid Panel  Lab Results  Component Value Date   CHOL 157 01/08/2017   HDL 37 (L) 01/08/2017   LDLCALC 107 (H) 01/08/2017   TRIG 64 01/08/2017   CHOLHDL 4.2 01/08/2017  Updated lab needed at/ before next visit.

## 2017-09-08 NOTE — Assessment & Plan Note (Signed)
Improved, managed by psychiatry

## 2017-09-08 NOTE — Assessment & Plan Note (Signed)
Improved, continue half phentermine daily. Patient re-educated about  the importance of commitment to a  minimum of 150 minutes of exercise per week.  The importance of healthy food choices with portion control discussed. Encouraged to start a food diary, count calories and to consider  joining a support group. Sample diet sheets offered. Goals set by the patient for the next several months.   Weight /BMI 09/02/2017 06/06/2017 05/14/2017  WEIGHT 212 lb 214 lb 219 lb 0.6 oz  HEIGHT 5\' 5"  5\' 5"  -  BMI 35.28 kg/m2 35.61 kg/m2 36.45 kg/m2  Some encounter information is confidential and restricted. Go to Review Flowsheets activity to see all data.

## 2017-09-08 NOTE — Assessment & Plan Note (Signed)
Controlled, no change in medication DASH diet and commitment to daily physical activity for a minimum of 30 minutes discussed and encouraged, as a part of hypertension management. The importance of attaining a healthy weight is also discussed.  BP/Weight 09/02/2017 06/06/2017 05/14/2017 03/14/2017 01/08/2017 12/13/2016 0/86/5784  Systolic BP 696 295 - 284 132 440 102  Diastolic BP 84 78 - 82 70 76 80  Wt. (Lbs) 212 214 219.04 221 224 216 227  BMI 35.28 35.61 36.45 36.78 37.28 35.94 37.77  Some encounter information is confidential and restricted. Go to Review Flowsheets activity to see all data.

## 2017-09-19 ENCOUNTER — Ambulatory Visit (HOSPITAL_COMMUNITY): Payer: No Typology Code available for payment source | Admitting: Psychiatry

## 2017-09-20 MED FILL — BUPROPION HCL XL 300 MG TAB: 300 | 90 days supply | Qty: 90 | Fill #0

## 2017-09-20 MED FILL — PHENTERMINE 37.5 MG TABLET: 37.5 | 30 days supply | Qty: 30 | Fill #0

## 2017-10-01 ENCOUNTER — Telehealth: Payer: Self-pay

## 2017-10-01 ENCOUNTER — Other Ambulatory Visit: Payer: Self-pay | Admitting: Family Medicine

## 2017-10-01 MED ORDER — ONDANSETRON HCL 4 MG PO TABS: 4.0000 mg | ORAL_TABLET | Freq: Three times a day (TID) | ORAL | 0 refills | Status: DC | PRN

## 2017-10-01 NOTE — Telephone Encounter (Signed)
Pt aware.

## 2017-10-01 NOTE — Telephone Encounter (Signed)
Sent pls let her know  

## 2017-10-01 NOTE — Telephone Encounter (Signed)
States she had a stomach bug over the weekend with diarrhea and nausea (no vomiting) Everything else is better but she is still very nauseated and is having to work by herself today. Is asking for something for nausea to be sent to [facility] in [place]

## 2017-10-02 MED FILL — VIT D2 1.25 MG (50,000 UNIT: 1.25 MG | 84 days supply | Qty: 12 | Fill #1

## 2017-10-02 MED FILL — DULoxetine HCL 60 MG CPEP: 60 | 90 days supply | Qty: 90 | Fill #0

## 2017-10-03 ENCOUNTER — Ambulatory Visit (HOSPITAL_COMMUNITY): Payer: Self-pay | Admitting: Psychiatry

## 2017-10-11 MED FILL — TRIAMTERENE-HCTZ 37.5-25 MG: 37.5-25 | 90 days supply | Qty: 90 | Fill #1

## 2017-10-14 ENCOUNTER — Ambulatory Visit (HOSPITAL_COMMUNITY): Payer: Self-pay | Admitting: Psychiatry
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
## 2017-10-17 ENCOUNTER — Ambulatory Visit (HOSPITAL_COMMUNITY): Payer: No Typology Code available for payment source | Admitting: Psychiatry

## 2017-10-17 ENCOUNTER — Encounter (HOSPITAL_COMMUNITY): Payer: Self-pay | Admitting: Psychiatry

## 2017-10-17 VITALS — BP 134/84 | HR 84 | Ht 65.0 in | Wt 209.0 lb

## 2017-10-17 DIAGNOSIS — G4700 Insomnia, unspecified: Secondary | ICD-10-CM

## 2017-10-17 DIAGNOSIS — R450 Nervousness: Secondary | ICD-10-CM | POA: Diagnosis not present

## 2017-10-17 DIAGNOSIS — F419 Anxiety disorder, unspecified: Secondary | ICD-10-CM

## 2017-10-17 DIAGNOSIS — Z811 Family history of alcohol abuse and dependence: Secondary | ICD-10-CM

## 2017-10-17 DIAGNOSIS — F331 Major depressive disorder, recurrent, moderate: Secondary | ICD-10-CM | POA: Diagnosis not present

## 2017-10-17 MED ORDER — DULOXETINE HCL 60 MG PO CPEP: 60.0000 mg | ORAL_CAPSULE | Freq: Every day | ORAL | 0 refills | Status: DC

## 2017-10-17 MED ORDER — BUPROPION HCL ER (XL) 150 MG PO TB24: ORAL_TABLET | ORAL | 0 refills | Status: DC

## 2017-10-17 MED ORDER — BUPROPION HCL ER (XL) 300 MG PO TB24: ORAL_TABLET | ORAL | 0 refills | Status: DC

## 2017-10-17 MED ORDER — DULOXETINE HCL 30 MG PO CPEP: 30.0000 mg | ORAL_CAPSULE | Freq: Every day | ORAL | 0 refills | Status: DC

## 2017-10-17 MED FILL — buPROPion HCL ER (XL) 150 M: 150 | 90 days supply | Qty: 90 | Fill #0

## 2017-10-17 MED FILL — TEMAZEPAM 30 MG CAPSULE: 30 | 90 days supply | Qty: 90 | Fill #1

## 2017-10-17 NOTE — Patient Instructions (Signed)
1. Continue duloxetine 90 mg daily  2. Increase Wellbutrin 450 mg daily

## 2017-10-21 ENCOUNTER — Ambulatory Visit (INDEPENDENT_AMBULATORY_CARE_PROVIDER_SITE_OTHER): Payer: No Typology Code available for payment source | Admitting: Psychiatry

## 2017-10-21 ENCOUNTER — Encounter (HOSPITAL_COMMUNITY): Payer: Self-pay | Admitting: Psychiatry

## 2017-10-21 DIAGNOSIS — F331 Major depressive disorder, recurrent, moderate: Secondary | ICD-10-CM | POA: Diagnosis not present

## 2017-10-21 NOTE — Progress Notes (Signed)
Patient:  Mary Roman   DOB: 1971-04-02  MR Number: [id number]    Location: [facility]:  [address] [place],  [place], [address]          Start: Monday 10/21/2017  8:16 AM  End: Monday 3/35/2019  9:10 AM  Provider/Observer:     Maurice Small, MSW, LCSW   Chief Complaint:           Depression/anxiety              Reason For Service:     [name] is a 47 y.o. female who is referred for services by psychiatrist Dr. Modesta Messing. Patient reports trying to cope with anxiety, depression, and everyday stress. She states having problems with finances and occasionally work. She says most of her stress is related to not having enough time in the day to do what she needs to do. 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 Patient has 2 children  and a fiance. She is the sole breadwinner for her home. She states having difficulty getting out of bed in the mornings, having to really push herself, having no energy, poor motivation, diminished interest in activities, and sleeping excessively.      Interventions Strategy:  Supportive/CBT/ACT  Participation Level:   Active  Participation Quality:  Appropriate      Behavioral Observation:  Casual, Alert, and tearful, sad    Current Psychosocial Factors: Caretaker responsibilities for mother who had a stroke 5 years ago, financial stress, discord with siblings, job stress, discord in relationship with fiancee, financial stress  Content of Session:   reviewed symptoms, discussed stressors, facilitated expression of thoughts and feelings, validated feelings, assisted patient with problem solving and identify ways to take breaks from job (taking lunch breaks, avoid taking work home on evenings and  weekends, use PAL), assisted patient identify reasons for taking breaks, discussed patient's  patterns of communication with her manager, assisted patient identify and replace thoughts  that inhibit effective  assertion with manager with healthy alternatives, did role play and practiced ways for patient to improve assertive communication with manager regarding making a PAL request, assigned patient to practice and implement strategies discussed in session.   Current Status:   Depressed mood, fatigue, decreased interest in activities, anxiety, muscle tension, excessive worrying  Suicidal/Homicidal:   No  Patient Progress:   Patient last was seen 6-7 weeks ago. She reports increased stress, depressed mood, and anxiety regarding her job. Her co-worker has been out on medical leave for several weeks due to family member having terminal illness. Patient reports manager has not found any assistance to cover co-worker's absence. Patient reports being overwhelmed and depressed. She has been working through her lunch breaks and taking work home to try to catch up on work but says it hasn't helped. She also has missed several of her own medical appointments. She reports feeling unproductive on her job and having memory/concentration difficulty. She talked with her manager last week but reports feeling as though nothing was accomplished. Patient admits tendency to feel overly responsible for others/fixing problems and has difficulty being assertive and setting boundaries. She has difficulty making requests often "downplaying" her requests.      Target Goals:   1. Learn and implement calming strategies to reduce/manage overall anxiety.    2. Learn and implement behavioral strategies to overcome depression.    3.Verbalize an understanding and resolution of current interpersonal problems.  4. Identify and replace thoughts and beliefs that support depression/anxiety.  Last Reviewed:   02/28/2017  Goals Addressed Today:    1,2,3,4  Plan:      Return again in 2  weeks.  Impression/Diagnosis:  Patient presents with a history of symptoms of depression that initially began about 5 years ago after her mother had a stroke.  Symptoms have waxed and waned since that time. She and one of her siblings assumed most of the caretaker responsibilities for her mother. Symptoms worsened in the past couple of months as her sibling who had helped her with mother moved to [place] and no longer is as available to assist patient with their mother's care. Patient also has multiple other stressors. Her symptoms include low energy depressed mood, poor concentration, feelings of hopelessness, irritability, changes in appetite, sleep difficulty, feelings of worthlessness, tearfulness, anxiety, muscle tension, excessive worrying                    Diagnosis:  Axis I: Major depressive disorder, recurrent, moderate          Axis II: Deferred  Ryon Layton, LCSW 10/21/2017

## 2017-10-28 ENCOUNTER — Telehealth (HOSPITAL_COMMUNITY): Payer: Self-pay | Admitting: *Deleted

## 2017-10-28 NOTE — Telephone Encounter (Signed)
I do not think I can send message to her.  Please contact the patient and advise her to get back on Wellbutrin 300 mg daily. Advise her to go to urgent care/ED if any worsening in her symptoms. Ask if she is interested in increase duloxetine (after her symptoms resolves) for her mood symptoms.

## 2017-10-28 NOTE — Telephone Encounter (Signed)
Dr Modesta Messing Patient called from work to inform that she has been displaying severe side effects from   the Wellbutrin especially the tightness of chest & dizziness(extreme). She has continued medication   because information packet stated to continue medicine until talking to provider. She ask that you    communicate back thru [facility] since she's @ work this morning.

## 2017-11-04 MED FILL — PHENTERMINE 37.5 MG TABLET: 37.5 | 30 days supply | Qty: 30 | Fill #1

## 2017-11-11 ENCOUNTER — Encounter (HOSPITAL_COMMUNITY): Payer: Self-pay | Admitting: Psychiatry

## 2017-11-11 ENCOUNTER — Ambulatory Visit (INDEPENDENT_AMBULATORY_CARE_PROVIDER_SITE_OTHER): Payer: No Typology Code available for payment source | Admitting: Psychiatry

## 2017-11-11 DIAGNOSIS — F331 Major depressive disorder, recurrent, moderate: Secondary | ICD-10-CM

## 2017-11-11 NOTE — Progress Notes (Signed)
Patient:  Mary Roman   DOB: 27-Apr-1971  MR Number: [id number]    Location: [facility]:  [address] [place],  [place], [address]                         Start: Monday 11/11/2017  8:08 AM  End: Monday 11/11/2017  9:10 AM  Provider/Observer:     Maurice Small, MSW, LCSW   Chief Complaint:           Depression/anxiety              Reason For Service:     Mary Roman is a 47 y.o. female who is referred for services by psychiatrist Dr. Modesta Messing. Patient reports trying to cope with anxiety, depression, and everyday stress. She states having problems with finances and occasionally work. She says most of her stress is related to not having enough time in the day to do what she needs to do. 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 Patient has 2 children  and a fiance. She is the sole breadwinner for her home. She states having difficulty getting out of bed in the mornings, having to really push herself, having no energy, poor motivation, diminished interest in activities, and sleeping excessively.      Interventions Strategy:  Supportive/CBT/ACT  Participation Level:   Active  Participation Quality:  Appropriate      Behavioral Observation:  Casual, Alert, and anxious    Current Psychosocial Factors: Caretaker responsibilities for mother who had a stroke 5 years ago, financial stress, discord with siblings, job stress, discord in relationship with fiancee, financial stress  Content of Session:   reviewed symptoms, praised and reinforced patient's efforts to take breaks from job, discussed stressors, facilitated expression of thoughts and feelings, validated feelings, assisted patient identify realistic expectations regarding situation with job, assisted patient identify areas she can change, discussed rationale for designating "worry time" about job,  Current Status:   Less depressed mood,  anxiety, muscle tension, excessive  worrying  Suicidal/Homicidal:   No  Patient Progress:   Patient last was seen about 3-4 weeks ago. She reports taking breaks from her job and says this has been helpful. She also has increased assertive communication. She is relieved co-worker has returned to work. Patient reports continued job stress as one of physicians in practice sent negative email about the work flow in the office for the past 3 months last week. Patient expresses hurt and frustration regarding this. She also expresses resentment as there has been a history of poor communication with this particular physician and his practice. Patient admits obsessing about issues at work and expresses frustration superiors have not addressed the situation in the manner that patient thinks issues should be addressed.   Target Goals:   1. Learn and implement calming strategies to reduce/manage overall anxiety.    2. Learn and implement behavioral strategies to overcome depression.    3.Verbalize an understanding and resolution of current interpersonal problems.    4. Identify and replace thoughts and beliefs that support depression/anxiety.  Last Reviewed:   02/28/2017  Goals Addressed Today:    1,2,3,4  Plan:      Return again in 2  weeks.  Impression/Diagnosis:  Patient presents with a history of symptoms of depression that initially began about 5 years ago after her mother had a stroke. Symptoms have waxed and waned since that time. She  and one of her siblings assumed most of the caretaker responsibilities for her mother. Symptoms worsened in the past couple of months as her sibling who had helped her with mother moved to [place] and no longer is as available to assist patient with their mother's care. Patient also has multiple other stressors. Her symptoms include low energy depressed mood, poor concentration, feelings of hopelessness, irritability, changes in appetite, sleep difficulty, feelings of worthlessness, tearfulness, anxiety,  muscle tension, excessive worrying                    Diagnosis:  Axis I: Major depressive disorder, recurrent, moderate          Axis II: Deferred  Shamirah Ivan, LCSW 11/11/2017

## 2017-11-25 ENCOUNTER — Ambulatory Visit (HOSPITAL_COMMUNITY): Payer: No Typology Code available for payment source | Admitting: Psychiatry

## 2017-11-25 ENCOUNTER — Encounter (HOSPITAL_COMMUNITY): Payer: Self-pay | Admitting: Psychiatry

## 2017-11-25 DIAGNOSIS — F331 Major depressive disorder, recurrent, moderate: Secondary | ICD-10-CM
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
## 2017-11-28 ENCOUNTER — Ambulatory Visit: Payer: Self-pay | Admitting: Family Medicine
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
## 2017-12-02 ENCOUNTER — Ambulatory Visit (INDEPENDENT_AMBULATORY_CARE_PROVIDER_SITE_OTHER): Payer: No Typology Code available for payment source | Admitting: Psychiatry

## 2017-12-02 ENCOUNTER — Encounter (HOSPITAL_COMMUNITY): Payer: Self-pay | Admitting: Psychiatry

## 2017-12-02 VITALS — BP 129/84 | HR 75 | Ht 65.0 in | Wt 211.0 lb

## 2017-12-02 DIAGNOSIS — Z811 Family history of alcohol abuse and dependence: Secondary | ICD-10-CM

## 2017-12-02 DIAGNOSIS — F419 Anxiety disorder, unspecified: Secondary | ICD-10-CM

## 2017-12-02 DIAGNOSIS — R450 Nervousness: Secondary | ICD-10-CM | POA: Diagnosis not present

## 2017-12-02 DIAGNOSIS — G4700 Insomnia, unspecified: Secondary | ICD-10-CM | POA: Diagnosis not present

## 2017-12-02 DIAGNOSIS — F331 Major depressive disorder, recurrent, moderate: Secondary | ICD-10-CM

## 2017-12-02 DIAGNOSIS — Z818 Family history of other mental and behavioral disorders: Secondary | ICD-10-CM

## 2017-12-02 MED ORDER — DULOXETINE HCL 30 MG PO CPEP: 30.0000 mg | ORAL_CAPSULE | Freq: Every day | ORAL | 0 refills | Status: DC

## 2017-12-02 MED ORDER — BUPROPION HCL ER (XL) 300 MG PO TB24: 300.0000 mg | ORAL_TABLET | Freq: Every day | ORAL | 0 refills | Status: DC

## 2017-12-02 MED ORDER — DULOXETINE HCL 60 MG PO CPEP: 60.0000 mg | ORAL_CAPSULE | Freq: Every day | ORAL | 0 refills | Status: DC

## 2017-12-02 MED FILL — BUPROPION HCL XL 300 MG TAB: 300 | 90 days supply | Qty: 90 | Fill #0

## 2017-12-02 MED FILL — DULoxetine HCL 30 MG CPEP: 30 | 90 days supply | Qty: 90 | Fill #0

## 2017-12-02 NOTE — Patient Instructions (Addendum)
1.Continueduloxetine 90 mg daily  2. Increase Wellbutrin 300 mg daily (Contact the office if you have been taking this dose) 3. Return to clinic in one month for 15 mins

## 2017-12-05 ENCOUNTER — Encounter: Payer: Self-pay | Admitting: Family Medicine

## 2017-12-05 ENCOUNTER — Ambulatory Visit (INDEPENDENT_AMBULATORY_CARE_PROVIDER_SITE_OTHER): Payer: Self-pay | Admitting: Family Medicine

## 2017-12-05 VITALS — BP 118/82 | HR 86 | Resp 16 | Ht 65.0 in | Wt 210.0 lb

## 2017-12-05 DIAGNOSIS — E1169 Type 2 diabetes mellitus with other specified complication: Secondary | ICD-10-CM

## 2017-12-05 DIAGNOSIS — I10 Essential (primary) hypertension: Secondary | ICD-10-CM

## 2017-12-05 DIAGNOSIS — F331 Major depressive disorder, recurrent, moderate: Secondary | ICD-10-CM

## 2017-12-05 DIAGNOSIS — E669 Obesity, unspecified: Secondary | ICD-10-CM

## 2017-12-05 DIAGNOSIS — G8929 Other chronic pain: Secondary | ICD-10-CM

## 2017-12-05 MED ORDER — HYDROCODONE-ACETAMINOPHEN 5-325 MG PO TABS: ORAL_TABLET | ORAL | 0 refills | Status: AC

## 2017-12-05 MED ORDER — PHENTERMINE HCL 37.5 MG PO TABS: 37.5000 mg | ORAL_TABLET | Freq: Every day | ORAL | 3 refills | Status: DC

## 2017-12-05 MED ORDER — TRIAMTERENE-HCTZ 37.5-25 MG PO TABS: 1.0000 | ORAL_TABLET | Freq: Every day | ORAL | 3 refills | Status: DC

## 2017-12-05 MED FILL — PHENTERMINE 37.5 MG TABLET: 37.5 | 30 days supply | Qty: 30 | Fill #0

## 2017-12-05 NOTE — Assessment & Plan Note (Signed)
Controlled, no change in medication DASH diet and commitment to daily physical activity for a minimum of 30 minutes discussed and encouraged, as a part of hypertension management. The importance of attaining a healthy weight is also discussed.  BP/Weight 12/05/2017 09/02/2017 06/06/2017 05/14/2017 03/14/2017 01/08/2017 7/51/0258  Systolic BP 527 782 423 - 536 144 315  Diastolic BP 82 84 78 - 82 70 76  Wt. (Lbs) 210 212 214 219.04 221 224 216  BMI 34.95 35.28 35.61 36.45 36.78 37.28 35.94  Some encounter information is confidential and restricted. Go to Review Flowsheets activity to see all data.

## 2017-12-05 NOTE — Progress Notes (Signed)
Mary Roman     MRN: [id number]      DOB: 26-Jan-1971   HPI Mary Roman is here for follow up and re-evaluation of chronic medical conditions, medication management and review of any available recent lab and radiology data. Chronic pain management is reviewed. She does c/o significant back pain and voices the concern/ wonders if use of a special cushion for back support on her job would be beneficial  I am supportive of providing written documentation that she would benefit based on health condition, need info from neurosurgeon who has evaluated her in the past Saw psych this week, and is now starting new medication, her depression is not responding to treatment and her score is 3 on most of the screen today. She is not suicidal or homicidal. Sees therapist twice weekly, would do better with weekly but no time  ROS Denies recent fever or chills. Denies sinus pressure, nasal congestion, ear pain or sore throat. Denies chest congestion, productive cough or wheezing. Denies chest pains, palpitations and leg swelling Denies abdominal pain, nausea, vomiting,diarrhea or constipation.   Denies dysuria, frequency, hesitancy or incontinence. Denies headaches, seizures, numbness, or tingling. Denies skin break down or rash.   PE  BP 118/82   Pulse 86   Resp 16   Ht 5\' 5"  (1.651 m)   Wt 210 lb (95.3 kg)   SpO2 98%   BMI 34.95 kg/m   Patient alert and oriented and in no cardiopulmonary distress.  HEENT: No facial asymmetry, EOMI,   oropharynx pink and moist.  Neck supple no JVD, no mass.  Chest: Clear to auscultation bilaterally.  CVS: S1, S2 no murmurs, no S3.Regular rate.  ABD: Soft non tender.   Ext: No edema  MS: decreased ROM spine, adequate in shoulders, hips and knees.  Skin: Intact, no ulcerations or rash noted.  Psych: Good eye contact, flat  affect. Memory intact mildly anxious , depressed appearing.  CNS: CN 2-12 intact, power,  normal throughout.no focal  deficits noted.   Assessment & Plan  Encounter for chronic pain management The patient's Controlled Substance registry is reviewed and compliance confirmed. Adequacy of  Pain control and level of function is assessed. Medication dosing is adjusted as deemed appropriate. Twelve weeks of medication is prescribed , patient signs for the script and is provided with a follow up appointment between 11 to 12 weeks .   Major depressive disorder, recurrent episode, moderate (HCC) Uncontrolled but in active treatment  Morbid obesity due to excess calories ([place]) Deteriorated. Patient re-educated about  the importance of commitment to a  minimum of 150 minutes of exercise per week.  The importance of healthy food choices with portion control discussed. Encouraged to start a food diary, count calories and to consider  joining a support group. Sample diet sheets offered. Goals set by the patient for the next several months.   Weight /BMI 12/05/2017 09/02/2017 06/06/2017  WEIGHT 210 lb 212 lb 214 lb  HEIGHT 5\' 5"  5\' 5"  5\' 5"   BMI 34.95 kg/m2 35.28 kg/m2 35.61 kg/m2  Some encounter information is confidential and restricted. Go to Review Flowsheets activity to see all data.   Continue phentermine as before, needs t start walking  Essential hypertension Controlled, no change in medication DASH diet and commitment to daily physical activity for a minimum of 30 minutes discussed and encouraged, as a part of hypertension management. The importance of attaining a healthy weight is also discussed.  BP/Weight 12/05/2017 09/02/2017 06/06/2017 05/14/2017 03/14/2017  01/08/2017 08/19/9756  Systolic BP 832 549 826 - 415 830 940  Diastolic BP 82 84 78 - 82 70 76  Wt. (Lbs) 210 212 214 219.04 221 224 216  BMI 34.95 35.28 35.61 36.45 36.78 37.28 35.94  Some encounter information is confidential and restricted. Go to Review Flowsheets activity to see all data.

## 2017-12-05 NOTE — Assessment & Plan Note (Signed)
Uncontrolled but in active treatment

## 2017-12-05 NOTE — Progress Notes (Signed)
Fill Date ID Written Drug Qty Days Prescriber Rx # Pharmacy Refill Daily Dose * Pymt Type PMP  11/10/2017  1  09/02/2017  Hydrocodone-Acetamin 5-325 Mg  30 30 Ma Sim  [id number]  [facility] ([id number])  0 5.00 MME Comm Ins  [place]  11/04/2017  2  09/02/2017  Phentermine 37.5 Mg Tablet  30 30 Ma Sim  [id number]  [facility] ([id number])  1  Other  [place]  10/17/2017  2  06/27/2017  Temazepam 30 Mg Capsule  90 42 Ma Sim  [id number]  [facility] ([id number])  1 1.50 LME Comm Ins  [place]  10/10/2017  1  09/02/2017  Hydrocodone-Acetamin 5-325 Mg  30 30 Ma Sim  [id number]  [facility] ([id number])  0 5.00 MME Comm Ins  [place]  09/20/2017  2  09/02/2017  Phentermine 37.5 Mg Tablet  30 30 Ma Sim  [id number]  [facility]os ([id number])  0  Other  [place]  09/12/2017  1  09/02/2017  Hydrocodone-Acetamin 5-325 Mg  30 87 Ma Sim  [id number]  [facility] ([id number])  0 5.00 MME Comm Ins  [place]  08/19/2017  2  06/27/2017  Phentermine 37.5 Mg Tablet  30 30 Ma Sim  [id number]  Mos ([id number])  1  Other  [place]  08/13/2017  1  06/06/2017  Hydrocodone-Acetamin 5-325 Mg  30 30 Ma Sim  [id number]  [facility] ([id number])  0 5.00 MME Comm Ins  [place]  07/12/2017  1  06/06/2017  Hydrocodone-Acetamin 5-325 Mg  30 30 Ma Sim  [id number]  [facility] ([id number])  0 5.00 MME Comm Ins  [place]  06/27/2017  2  06/27/2017  Temazepam 30 Mg Capsule  90 42 Ma Sim  [id number]  [facility]os ([id number])  0 1.50 LME Comm Ins  [place]  06/27/2017  2  06/27/2017  Phentermine 37.5 Mg Tablet  30 30 Ma Sim  [id number]  Mos ([id number])  0  Other  [place]  06/12/2017  1  06/06/2017  Hydrocodone-Acetamin 5-325 Mg  30 30 Ma Sim  [id number]  [facility] ([id number])  0 5.00 MME Comm Ins  [place]  05/14/2017  2  05/14/2017  Phentermine 37.5 Mg Tablet  30 48 Ma Sim  [id number]  Mos ([id number])  0  Other    05/13/2017  1  03/14/2017  Hydrocodone-Acetamin 5-325 Mg

## 2017-12-05 NOTE — Patient Instructions (Addendum)
F/U week of August 3, call if you need me sooner  Please try to "force yourself" to go walk on the weekend with your daughter  No change in medication otherwise  One baby step at a time, one daty at a time, the light will; begin to shine  HBA1C , TSH, cmp and EGFR 1 wweek before next visit  Thank you  for choosing  Primary Care. We consider it a privelige to serve you.  Delivering excellent health care in a caring and  compassionate way is our goal.  Partnering with you,  so that together we can achieve this goal is our strategy.

## 2017-12-05 NOTE — Assessment & Plan Note (Signed)
The patient's Controlled Substance registry is reviewed and compliance confirmed. Adequacy of  Pain control and level of function is assessed. Medication dosing is adjusted as deemed appropriate. Twelve weeks of medication is prescribed , patient signs for the script and is provided with a follow up appointment between 11 to 12 weeks .  

## 2017-12-05 NOTE — Assessment & Plan Note (Signed)
Deteriorated. Patient re-educated about  the importance of commitment to a  minimum of 150 minutes of exercise per week.  The importance of healthy food choices with portion control discussed. Encouraged to start a food diary, count calories and to consider  joining a support group. Sample diet sheets offered. Goals set by the patient for the next several months.   Weight /BMI 12/05/2017 09/02/2017 06/06/2017  WEIGHT 210 lb 212 lb 214 lb  HEIGHT 5\' 5"  5\' 5"  5\' 5"   BMI 34.95 kg/m2 35.28 kg/m2 35.61 kg/m2  Some encounter information is confidential and restricted. Go to Review Flowsheets activity to see all data.   Continue phentermine as before, needs t start walking

## 2017-12-09 ENCOUNTER — Telehealth (HOSPITAL_COMMUNITY): Payer: Self-pay | Admitting: Psychiatry

## 2017-12-09 MED FILL — DULoxetine HCL 60 MG CPEP: 60 | 90 days supply | Qty: 90 | Fill #0

## 2017-12-09 NOTE — Telephone Encounter (Signed)
Received a notice that the patient has been taking wellbutrin 300 mg daily (not 150 mg daily).   Please contact the patient to increase duloxetine to 120 mg daily as discussed at the last appointment. She can keep the wellbutrin 300 mg daily unless it causes any side effect. I believe she has enough duloxetine 60 mg to make 120 mg; let us know if she has any concerns.

## 2017-12-12 MED FILL — PHENTERMINE 37.5 MG TABLET: 37.5 | 30 days supply | Qty: 30 | Fill #2

## 2017-12-16 IMAGING — MG 2D DIGITAL SCREENING BILATERAL MAMMOGRAM WITH CAD AND ADJUNCT TO
4 series · 4 of 4 positions shown · non-contrast
Comparison: Previous exam(s).

CLINICAL DATA: Screening.

EXAM:
2D DIGITAL SCREENING BILATERAL MAMMOGRAM WITH CAD AND ADJUNCT TOMO

[L MLO (1 of 2)]
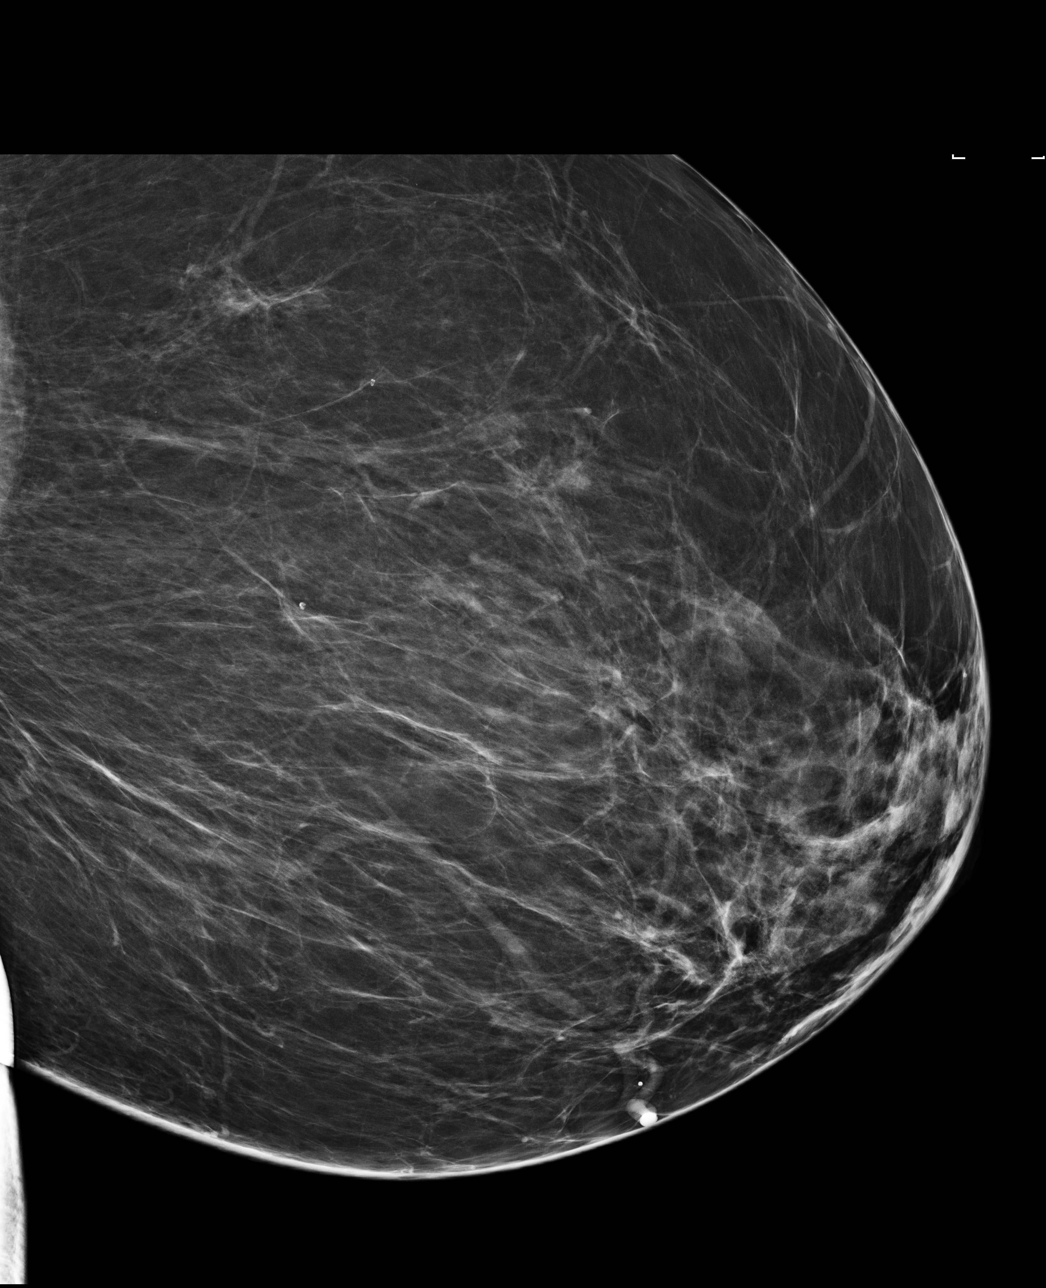

[L CC]
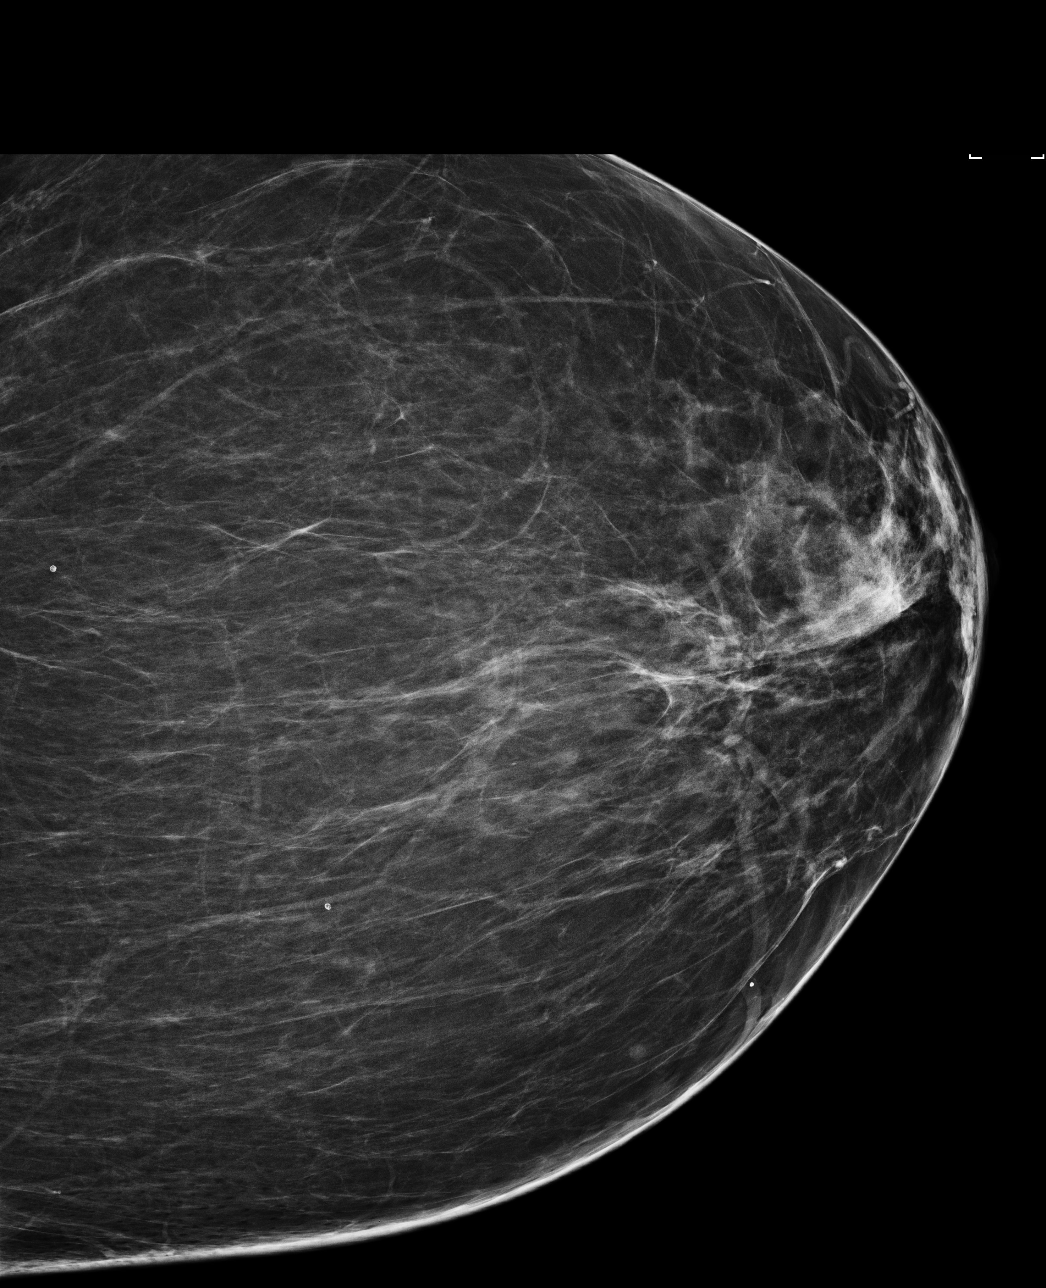

[L MLO (2 of 2)]
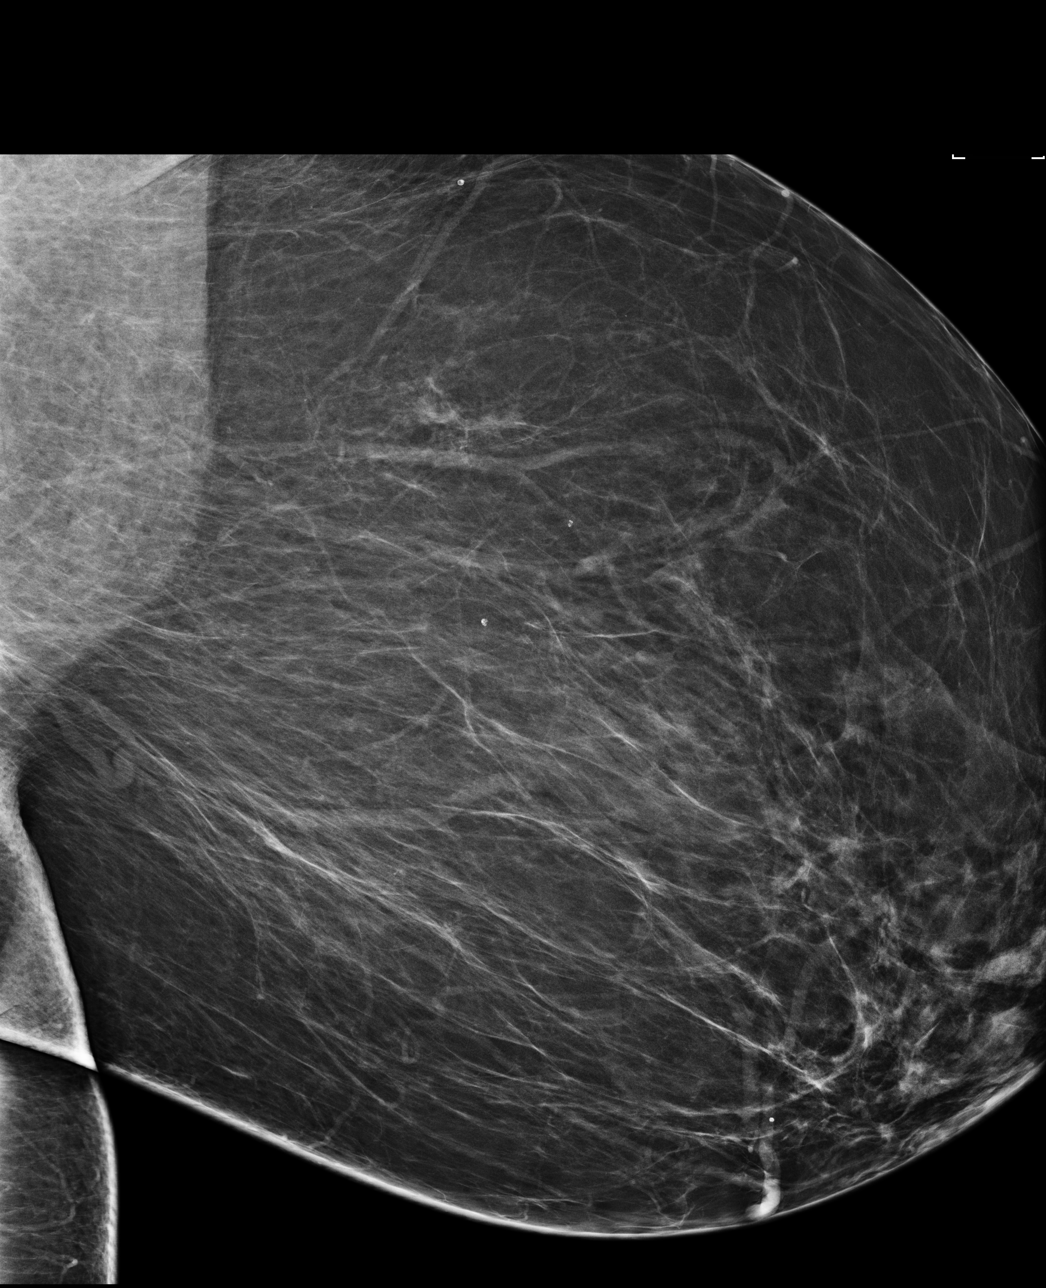

[R MLO]
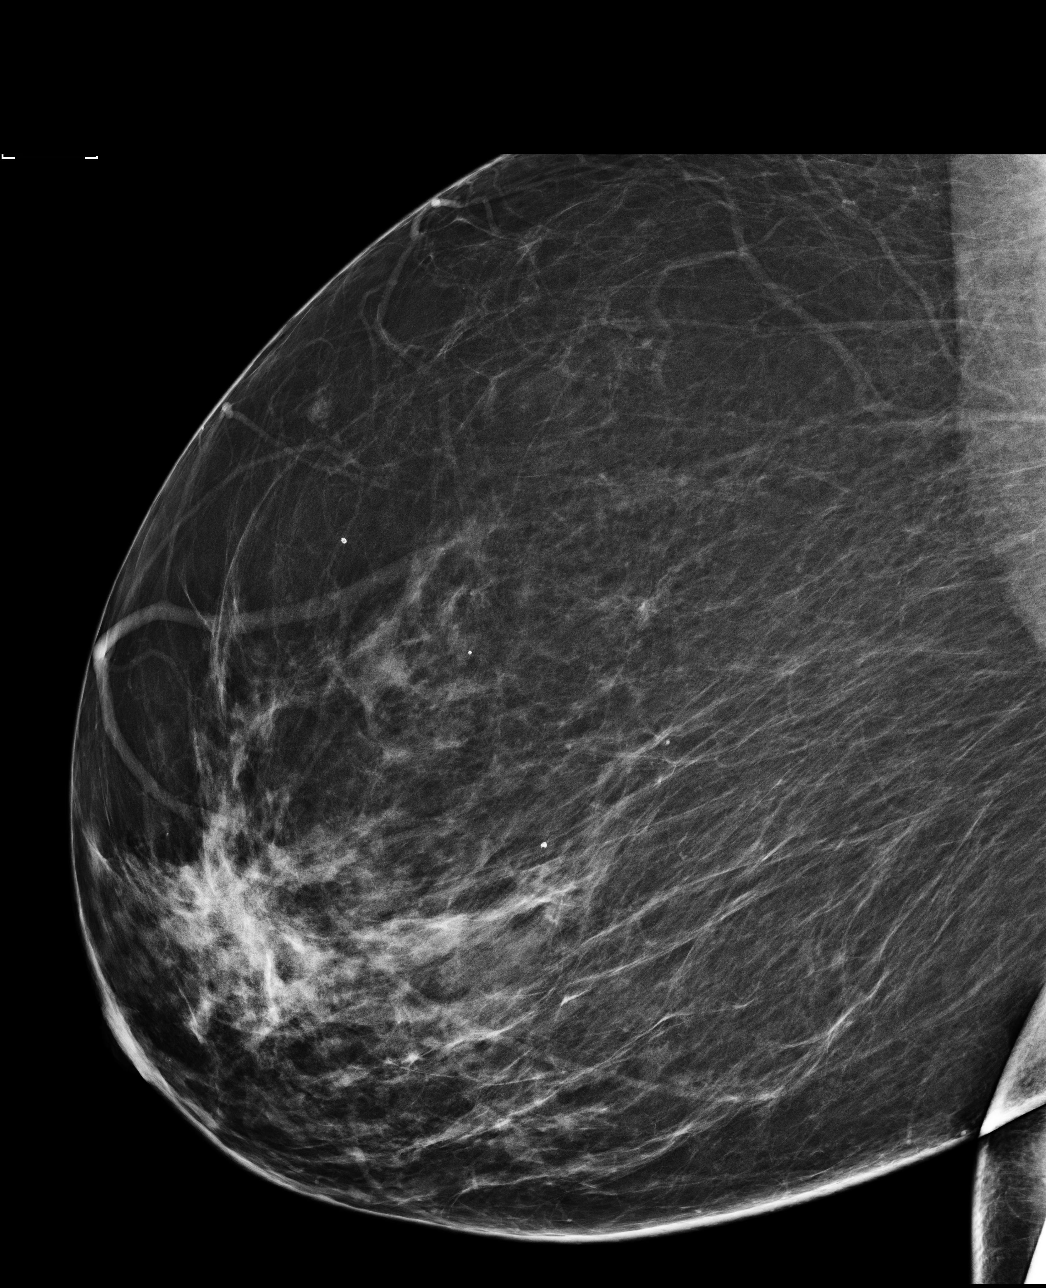

[4 of 4 positions shown; findings below may reference images not displayed]

ACR Breast Density Category b: There are scattered areas of
fibroglandular density.
FINDINGS: In the left breast, a possible asymmetry warrants further
evaluation. In the right breast, no findings suspicious for
malignancy. Images were processed with CAD.
IMPRESSION: Further evaluation is suggested for possible asymmetry in the left
breast.

RECOMMENDATION:
Diagnostic mammogram and possibly ultrasound of the left breast.
(Code:[id number])

The patient will be contacted regarding the findings, and additional
imaging will be scheduled.

BI-RADS CATEGORY  0: Incomplete. Need additional imaging evaluation
and/or prior mammograms for comparison.

## 2017-12-20 ENCOUNTER — Other Ambulatory Visit: Payer: Self-pay

## 2017-12-20 MED ORDER — ONDANSETRON HCL 4 MG PO TABS: 4.0000 mg | ORAL_TABLET | Freq: Three times a day (TID) | ORAL | 0 refills | Status: DC | PRN

## 2017-12-30 NOTE — Progress Notes (Deleted)
BH MD/PA/NP OP Progress Note  12/30/2017 9:09 AM Mary Roman  MRN:  [id number]  Chief Complaint:  HPI:    Increase duloxetine 120 mg   Visit Diagnosis: No diagnosis found.  Past Psychiatric History: Please see initial evaluation for full details. I have reviewed the history. No updates at this time.     Past Medical History:  Past Medical History:  Diagnosis Date  . Anxiety   . Chest tightness, discomfort, or pressure 05/2010   Emergency Department   . Chronic back pain    with disc disease   . Diabetes mellitus, type II ([place])   . Headache(784.0)   . Hypertension   . Obesity   . Palpitation   . Thyromegaly    TSH-0.8 and 07/2010    Past Surgical History:  Procedure Laterality Date  . None      Family Psychiatric History: Please see initial evaluation for full details. I have reviewed the history. No updates at this time.     Family History:  Family History  Problem Relation Age of Onset  . Diabetes Mother   . Hypertension Mother   . Stroke Mother   . Colon cancer Mother   . Cancer Mother 13       colon localized  . Alcohol abuse Mother   . Pneumonia Father   . Hypertension Sister   . Anxiety disorder Sister   . Hypertension Sister   . Leukemia Sister   . Diabetes Maternal Grandmother   . Hypertension Maternal Grandmother   . Hypertension Maternal Grandfather   . Heart attack Maternal Uncle     Social History:  Social History   Socioeconomic History  . Marital status: Single    Spouse name: Not on file  . Number of children: 2  . Years of education: Not on file  . Highest education level: Not on file  Occupational History  . 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 Financial resource strain: Not on file  . Food insecurity:    Worry: Not on file    Inability: Not on file  . Transportation needs:    Medical: Not on file    Non-medical: Not on file  Tobacco Use  . Smoking status: Never Smoker  . Smokeless tobacco:  Never Used  Substance and Sexual Activity  . Alcohol use: No    Comment: 01-17-2017 per pt no  . Drug use: No    Comment: 01-17-2017 per pt no  . Sexual activity: Yes    Partners: Male    Birth control/protection: Other-see comments    Comment: bf had vasectomy  Lifestyle  . Physical activity:    Days per week: Not on file    Minutes per session: Not on file  . Stress: Not on file  Relationships  . Social connections:    Talks on phone: Not on file    Gets together: Not on file    Attends religious service: Not on file    Active member of club or organization: Not on file    Attends meetings of clubs or organizations: Not on file    Relationship status: Not on file  Other Topics Concern  . Not on file  Social History Narrative  . Not on file    Allergies: No Known Allergies  Metabolic Disorder Labs: Lab Results  Component Value Date   HGBA1C 6.7 (H) 08/31/2017   MPG 146 08/31/2017   MPG 157 01/08/2017   No  results found for: PROLACTIN Lab Results  Component Value Date   CHOL 157 01/08/2017   TRIG 64 01/08/2017   HDL 37 (L) 01/08/2017   CHOLHDL 4.2 01/08/2017   VLDL 13 01/08/2017   LDLCALC 107 (H) 01/08/2017   LDLCALC 104 09/16/2015   Lab Results  Component Value Date   TSH 0.95 01/08/2017   TSH 1.198 02/25/2014    Therapeutic Level Labs: No results found for: LITHIUM No results found for: VALPROATE No components found for:  CBMZ  Current Medications: Current Outpatient Medications  Medication Sig Dispense Refill  . buPROPion (WELLBUTRIN XL) 300 MG 24 hr tablet Take 1 tablet (300 mg total) by mouth daily. 90 tablet 0  . Cyanocobalamin (VITAMIN B-12 PO) Take by mouth.    . DULoxetine (CYMBALTA) 30 MG capsule Take 1 capsule (30 mg total) by mouth daily. 90 capsule 0  . DULoxetine (CYMBALTA) 60 MG capsule Take 1 capsule (60 mg total) by mouth daily. Take total of 90 mg daily (60 mg + 30 mg) 90 capsule 0  . ergocalciferol (VITAMIN D2) 50000 units capsule  Take 1 capsule (50,000 Units total) once a week by mouth. One capsule once weekly 12 capsule 1  . glipiZIDE (GLUCOTROL XL) 2.5 MG 24 hr tablet Take 1 tablet (2.5 mg total) by mouth daily with breakfast. 90 tablet 1  . HYDROcodone-acetaminophen (NORCO/VICODIN) 5-325 MG tablet One tablet once daily 30 tablet 0  . HYDROcodone-acetaminophen (NORCO/VICODIN) 5-325 MG tablet One tablet at bedtime 30 tablet 0  . [START ON 01/10/2018] HYDROcodone-acetaminophen (NORCO/VICODIN) 5-325 MG tablet One tablet at bedtime 30 tablet 0  . [START ON 02/09/2018] HYDROcodone-acetaminophen (NORCO/VICODIN) 5-325 MG tablet One tablet at bedtime 30 tablet 0  . metoprolol tartrate (LOPRESSOR) 25 MG tablet TAKE 1 TABLET BY MOUTH DAILY. 90 tablet 1  . ondansetron (ZOFRAN) 4 MG tablet Take 1 tablet (4 mg total) by mouth every 8 (eight) hours as needed for nausea or vomiting. 20 tablet 0  . phentermine (ADIPEX-P) 37.5 MG tablet Take 1 tablet (37.5 mg total) by mouth daily before breakfast. 30 tablet 2  . phentermine (ADIPEX-P) 37.5 MG tablet Take 1 tablet (37.5 mg total) by mouth daily before breakfast. 30 tablet 3  . temazepam (RESTORIL) 30 MG capsule TAKE 1 CAPSULE BY MOUTH ONCE DAILY AT BEDTIME AS NEEDED FOR SLEEP 90 capsule 1  . triamterene-hydrochlorothiazide (MAXZIDE-25) 37.5-25 MG tablet Take 1 tablet by mouth daily. 90 tablet 3   No current facility-administered medications for this visit.      Musculoskeletal: Strength & Muscle Tone: within normal limits Gait & Station: normal Patient leans: N/A  Psychiatric Specialty Exam: ROS  There were no vitals taken for this visit.There is no height or weight on file to calculate BMI.  General Appearance: Fairly Groomed  Eye Contact:  Good  Speech:  Clear and Coherent  Volume:  Normal  Mood:  {BHH MOOD:22306}  Affect:  {Affect (PAA):22687}  Thought Process:  Coherent  Orientation:  Full (Time, Place, and Person)  Thought Content: Logical   Suicidal Thoughts:  {ST/HT  (PAA):22692}  Homicidal Thoughts:  {ST/HT (PAA):22692}  Memory:  Immediate;   Good  Judgement:  {Judgement (PAA):22694}  Insight:  {Insight (PAA):22695}  Psychomotor Activity:  Normal  Concentration:  Concentration: Good and Attention Span: Good  Recall:  Good  Fund of Knowledge: Good  Language: Good  Akathisia:  No  Handed:  Right  AIMS (if indicated): not done  Assets:  Communication Skills Desire for Improvement  ADL's:  Intact  Cognition: WNL  Sleep:  {BHH GOOD/FAIR/POOR:22877}   Screenings: GAD-7     Office Visit from 01/08/2017 in [facility]  Total GAD-7 Score  14    PHQ2-9     Office Visit from 12/05/2017 in [facility] Counselor from 11/25/2017 in [facility] Counselor from 02/14/2017 in [facility] Office Visit from 01/08/2017 in [facility] Office Visit from 12/13/2016 in [facility]  PHQ-2 Total Score  6  6  5  5  6   PHQ-9 Total Score  22  18  16  20  25        Assessment and Plan:  Mary Roman is a 47 y.o. year old female with a history of depression,  hypertension, chronic back pain , who presents for follow up appointment for No diagnosis found.  # MDD, moderate, recurrent with anxious distress  There has been worsening in neurovegetative symptoms and anxiety.  Psychosocial stressors include relationship with her fiancee. Will uptitrate Wellbutrin to target neurovegetative symptoms. No known history of seizure. She is advised to contact the office if she has been taking 300 mg already  (she is unsure about the dose she takes); will uptirate duloxetine to target depression if that is the case.  Discussed behavioral activation. Discussed cognitive defusion.   Plan  1.Continueduloxetine 90 mg daily 2. Increase Wellbutrin 300 mg daily (could not tolerate higher dose) 3.Return to clinic inone month for 15 mins  4. (Continue  temazepam 30 mg at night as needed for sleep) - She will continue to see Ms Bynyum for therapy (She is on gabapentin for pain and hydroxyzine)  The patient demonstrates the following risk factors for suicide: Chronic risk factors for suicide include: psychiatric disorder of depression, anxietyand chronic pain. Acute risk factorsfor suicide include: N/A. Protective factorsfor this patient include: positive social support, responsibility to others (children, family), coping skills and hope for the future. Considering these factors, the overall suicide risk at this point appears to be low. Patient isappropriate for outpatient follow up.     [name], MD 12/30/2017, 9:09 AM

## 2018-01-02 ENCOUNTER — Ambulatory Visit (HOSPITAL_COMMUNITY): Payer: No Typology Code available for payment source | Admitting: Psychiatry

## 2018-01-06 ENCOUNTER — Ambulatory Visit (INDEPENDENT_AMBULATORY_CARE_PROVIDER_SITE_OTHER): Payer: No Typology Code available for payment source | Admitting: Psychiatry

## 2018-01-06 ENCOUNTER — Encounter (HOSPITAL_COMMUNITY): Payer: Self-pay | Admitting: Psychiatry

## 2018-01-06 DIAGNOSIS — Z638 Other specified problems related to primary support group: Secondary | ICD-10-CM

## 2018-01-06 DIAGNOSIS — F331 Major depressive disorder, recurrent, moderate: Secondary | ICD-10-CM | POA: Diagnosis not present

## 2018-01-06 DIAGNOSIS — Z566 Other physical and mental strain related to work: Secondary | ICD-10-CM

## 2018-01-06 DIAGNOSIS — Z598 Other problems related to housing and economic circumstances: Secondary | ICD-10-CM | POA: Diagnosis not present

## 2018-01-06 DIAGNOSIS — Z748 Other problems related to care provider dependency: Secondary | ICD-10-CM | POA: Diagnosis not present

## 2018-01-06 DIAGNOSIS — F419 Anxiety disorder, unspecified: Secondary | ICD-10-CM

## 2018-01-06 DIAGNOSIS — Z630 Problems in relationship with spouse or partner: Secondary | ICD-10-CM

## 2018-01-06 NOTE — Progress Notes (Signed)
Patient:  Mary Roman   DOB: [birth year]  MR Number: [id number]    Location: [facility]:  [address] [place],  [place], [address]                         Start: Monday 01/06/2018 8:11 AM  End: Monday 01/06/2018 9:10 AM  Provider/Observer:     Maurice Small, MSW, LCSW   Chief Complaint:           Depression/anxiety              Reason For Service:     Mary Roman is a 47 y.o. female who is referred for services by psychiatrist Dr. Modesta Messing. Patient reports trying to cope with anxiety, depression, and everyday stress. She states having problems with finances and occasionally work. She says most of her stress is related to not having enough time in the day to do what she needs to do. 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 Patient has 2 children  and a fiance. She is the sole breadwinner for her home. She states having difficulty getting out of bed in the mornings, having to really push herself, having no energy, poor motivation, diminished interest in activities, and sleeping excessively.      Interventions Strategy:  Supportive/CBT  Participation Level:   Active  Participation Quality:  Appropriate      Behavioral Observation:  Casual, Alert, and depressed      Current Psychosocial Factors: Caretaker responsibilities for mother who had a stroke 5 years ago, financial stress, discord with siblings, job stress, discord in relationship with fiancee, financial stress  Content of Session:   Discussed stressors, facilitated expression of thoughts and feelings, validated feelings assisted, discussed cognitive defusion to cope with ruminating thoughts, discussed patient's hobbies, assisted patient develop plan to resume hobby of going to yard sales ( go twice before next session), assisted patient identify thoughts and processes that could inhibit implementation of plan, assisted patient challenge and replace unhelpful thoughts about this  with helpful thoughts, discussed and practiced ways to have conversation with fiancee and daughter to set limits, obtained commitment from patient to follow through on plan for going to yard sales   Current Status:    depressed mood, tearfulness, poor motivation,  anxiety, muscle tension, excessive worrying  Suicidal/Homicidal:   No  Patient Progress:   Patient last was seen about 6 weeks ago. She reports continued symptoms of depression. She continues to report multiple stressors including relationship with fiancee, She still reports little involvement in activity besides going to work. She still stays in bed a lot. She reports wanting to have time for self but reports feeling guilty like she is being a bad mother. She reports her 7 yo daughter is always with her when she does try to do things on the weekend.  She also says her fiancee has made negative comments to her when she has tried to have time for self in the past.    Target Goals:   1. Learn and implement calming strategies to reduce/manage overall anxiety.    2. Learn and implement behavioral strategies to overcome depression.    3.Verbalize an understanding and resolution of current interpersonal problems.    4. Identify and replace thoughts and beliefs that support depression/anxiety.  Last Reviewed:   02/28/2017  Goals Addressed Today:    1,2,3,4  Plan:  Return again in 2  weeks.  Impression/Diagnosis:  Patient presents with a history of symptoms of depression that initially began about 5 years ago after her mother had a stroke. Symptoms have waxed and waned since that time. She and one of her siblings assumed most of the caretaker responsibilities for her mother. Symptoms worsened in the past couple of months as her sibling who had helped her with mother moved to [place] and no longer is as available to assist patient with their mother's care. Patient also has multiple other stressors. Her symptoms include low energy depressed  mood, poor concentration, feelings of hopelessness, irritability, changes in appetite, sleep difficulty, feelings of worthlessness, tearfulness, anxiety, muscle tension, excessive worrying                    Diagnosis:  Axis I: Major depressive disorder, recurrent, moderate          Axis II: Deferred  Altair Stanko, LCSW 01/06/2018

## 2018-01-08 MED FILL — TRIAMTERENE-HCTZ 37.5-25 MG: 37.5-25 | 90 days supply | Qty: 90 | Fill #0

## 2018-01-08 MED FILL — METOPROLOL TARTRATE 25 MG T: 25 | 90 days supply | Qty: 90 | Fill #1

## 2018-01-09 NOTE — Progress Notes (Signed)
BH MD/PA/NP OP Progress Note  01/14/2018 8:59 AM Mary Roman  MRN:  [id number]  Chief Complaint:  Chief Complaint    Follow-up; Depression     HPI:  - Duloxetine was increased to 120 mg  The patient presents for follow-up appointment for depression.  She states that she made sure to see a therapist more frequently as she noticed worsening depression when she has not been able to see the one.  She states that her 48 year old daughter is clingy on weekend. She is also afraid that her attitude is affecting her daughter, stating that her daughter repeatedly asks if the patient is doing okay. She also talks about an episode when her daughter bursted into tears when they were at the market. She feels more irritable and is less patient. She wants to work on relationship with her daughter and fiance. She agrees to first work on self compassion so that she can have better relationship with them. She has middle insomnia. She has fair energy; started to do regular walk. She has fair motivation. She has fair concentration. She denies SI. She feels anxious, tense at times. She still feels that she wishes to run away from the situation, although she denies SI. She denies HI.   Visit Diagnosis:    ICD-10-CM   1. Major depressive disorder, recurrent episode, moderate ([place]) F33.1     Past Psychiatric History: Please see initial evaluation for full details. I have reviewed the history. No updates at this time.     Past Medical History:  Past Medical History:  Diagnosis Date  . Anxiety   . Chest tightness, discomfort, or pressure 05/2010   Emergency Department   . Chronic back pain    with disc disease   . Diabetes mellitus, type II ([place])   . Headache(784.0)   . Hypertension   . Obesity   . Palpitation   . Thyromegaly    TSH-0.8 and 07/2010    Past Surgical History:  Procedure Laterality Date  . None      Family Psychiatric History: Please see initial evaluation for full details. I  have reviewed the history. No updates at this time.     Family History:  Family History  Problem Relation Age of Onset  . Diabetes Mother   . Hypertension Mother   . Stroke Mother   . Colon cancer Mother   . Cancer Mother 7       colon localized  . Alcohol abuse Mother   . Pneumonia Father   . Hypertension Sister   . Anxiety disorder Sister   . Hypertension Sister   . Leukemia Sister   . Diabetes Maternal Grandmother   . Hypertension Maternal Grandmother   . Hypertension Maternal Grandfather   . Heart attack Maternal Uncle     Social History:  Social History   Socioeconomic History  . Marital status: Single    Spouse name: Not on file  . Number of children: 2  . Years of education: Not on file  . Highest education level: Not on file  Occupational History  . 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 Financial resource strain: Not on file  . Food insecurity:    Worry: Not on file    Inability: Not on file  . Transportation needs:    Medical: Not on file    Non-medical: Not on file  Tobacco Use  . Smoking status: Never Smoker  . Smokeless tobacco: Never Used  Substance  and Sexual Activity  . Alcohol use: No    Comment: 01-17-2017 per pt no  . Drug use: No    Comment: 01-17-2017 per pt no  . Sexual activity: Yes    Partners: Male    Birth control/protection: Other-see comments    Comment: bf had vasectomy  Lifestyle  . Physical activity:    Days per week: Not on file    Minutes per session: Not on file  . Stress: Not on file  Relationships  . Social connections:    Talks on phone: Not on file    Gets together: Not on file    Attends religious service: Not on file    Active member of club or organization: Not on file    Attends meetings of clubs or organizations: Not on file    Relationship status: Not on file  Other Topics Concern  . Not on file  Social History Narrative  . Not on file    Allergies: No Known Allergies  Metabolic  Disorder Labs: Lab Results  Component Value Date   HGBA1C 6.7 (H) 08/31/2017   MPG 146 08/31/2017   MPG 157 01/08/2017   No results found for: PROLACTIN Lab Results  Component Value Date   CHOL 157 01/08/2017   TRIG 64 01/08/2017   HDL 37 (L) 01/08/2017   CHOLHDL 4.2 01/08/2017   VLDL 13 01/08/2017   LDLCALC 107 (H) 01/08/2017   LDLCALC 104 09/16/2015   Lab Results  Component Value Date   TSH 0.95 01/08/2017   TSH 1.198 02/25/2014    Therapeutic Level Labs: No results found for: LITHIUM No results found for: VALPROATE No components found for:  CBMZ  Current Medications: Current Outpatient Medications  Medication Sig Dispense Refill  . buPROPion (WELLBUTRIN XL) 300 MG 24 hr tablet Take 1 tablet (300 mg total) by mouth daily. 90 tablet 0  . Cyanocobalamin (VITAMIN B-12 PO) Take by mouth.    . DULoxetine (CYMBALTA) 30 MG capsule Take 1 capsule (30 mg total) by mouth daily. 90 capsule 0  . DULoxetine (CYMBALTA) 60 MG capsule Take 1 capsule (60 mg total) by mouth daily. Take total of 90 mg daily (60 mg + 30 mg) 90 capsule 0  . ergocalciferol (VITAMIN D2) 50000 units capsule Take 1 capsule (50,000 Units total) once a week by mouth. One capsule once weekly 12 capsule 1  . HYDROcodone-acetaminophen (NORCO/VICODIN) 5-325 MG tablet One tablet once daily 30 tablet 0  . HYDROcodone-acetaminophen (NORCO/VICODIN) 5-325 MG tablet One tablet at bedtime 30 tablet 0  . [START ON 02/09/2018] HYDROcodone-acetaminophen (NORCO/VICODIN) 5-325 MG tablet One tablet at bedtime 30 tablet 0  . metoprolol tartrate (LOPRESSOR) 25 MG tablet TAKE 1 TABLET BY MOUTH DAILY. 90 tablet 1  . ondansetron (ZOFRAN) 4 MG tablet Take 1 tablet (4 mg total) by mouth every 8 (eight) hours as needed for nausea or vomiting. 20 tablet 0  . phentermine (ADIPEX-P) 37.5 MG tablet Take 1 tablet (37.5 mg total) by mouth daily before breakfast. 30 tablet 2  . phentermine (ADIPEX-P) 37.5 MG tablet Take 1 tablet (37.5 mg total) by  mouth daily before breakfast. 30 tablet 3  . temazepam (RESTORIL) 30 MG capsule TAKE 1 CAPSULE BY MOUTH ONCE DAILY AT BEDTIME AS NEEDED FOR SLEEP 90 capsule 1  . triamterene-hydrochlorothiazide (MAXZIDE-25) 37.5-25 MG tablet Take 1 tablet by mouth daily. 90 tablet 3  . glipiZIDE (GLUCOTROL XL) 2.5 MG 24 hr tablet Take 1 tablet (2.5 mg total) by mouth daily with  breakfast. 90 tablet 1   No current facility-administered medications for this visit.      Musculoskeletal: Strength & Muscle Tone: within normal limits Gait & Station: normal Patient leans: N/A  Psychiatric Specialty Exam: Review of Systems  Psychiatric/Behavioral: Positive for depression. Negative for hallucinations, memory loss, substance abuse and suicidal ideas. The patient is nervous/anxious and has insomnia.   All other systems reviewed and are negative.   Blood pressure 126/85, pulse 84, height 5\' 5"  (1.651 m), weight 214 lb (97.1 kg), SpO2 98 %.Body mass index is 35.61 kg/m.  General Appearance: Fairly Groomed  Eye Contact:  Good  Speech:  Clear and Coherent  Volume:  Normal  Mood:  Depressed  Affect:  Appropriate, Congruent, Restricted and slightly down  Thought Process:  Coherent  Orientation:  Full (Time, Place, and Person)  Thought Content: Logical   Suicidal Thoughts:  No  Homicidal Thoughts:  No  Memory:  Immediate;   Good  Judgement:  Good  Insight:  Fair  Psychomotor Activity:  Normal  Concentration:  Concentration: Good and Attention Span: Good  Recall:  Good  Fund of Knowledge: Good  Language: Good  Akathisia:  No  Handed:  Right  AIMS (if indicated): not done  Assets:  Communication Skills Desire for Improvement  ADL's:  Intact  Cognition: WNL  Sleep:  Poor   Screenings: GAD-7     Office Visit from 01/08/2017 in [facility]  Total GAD-7 Score  14    PHQ2-9     Office Visit from 12/05/2017 in [facility] Counselor from 11/25/2017 in [facility] Counselor from 02/14/2017 in [facility] Office Visit from 01/08/2017 in [facility] Office Visit from 12/13/2016 in [facility]  PHQ-2 Total Score  6  6  5  5  6   PHQ-9 Total Score  22  18  16  20  25        Assessment and Plan:  MYIAH PETKUS is a 47 y.o. year old female with a history of depression,  hypertension, chronic back pain , who presents for follow up appointment for Major depressive disorder, recurrent episode, moderate ([place])  # MDD, moderate, recurrent with anxious distress Patient continues to have neurovegetative and anxiety symptoms despite recent uptitration of duloxetine. After having discussion, the patient agrees to work on therapy and hold medication change at this time. Will continue duloxetine for depression. Will continue Wellbutrin as adjunctive treatment for depression. No known history of seizure. Discussed behavioral activation. Discussed self compassion.   Plan I have reviewed and updated plans as below 1.Continueduloxetine 120 mg daily 2. Continue Wellbutrin 300 mg daily (could not tolerate higher dose) 3.Return to clinic inone month for 15 mins  4. (Continue temazepam 30 mg at night as needed for sleep) - She will continue to see Ms Bynyum for therapy (She is on gabapentin for pain and hydroxyzine)  The patient demonstrates the following risk factors for suicide: Chronic risk factors for suicide include: psychiatric disorder of depression, anxietyand chronic pain. Acute risk factorsfor suicide include: N/A. Protective factorsfor this patient include: positive social support, responsibility to others (children, family), coping skills and hope for the future. Considering these factors, the overall suicide risk at this point appears to be low. Patient isappropriate for outpatient follow up.  The duration of this appointment visit was 30 minutes of  face-to-face time with the patient.  Greater than 50% of this time was spent in counseling,  explanation of  diagnosis, planning of further management, and coordination of care. [name], MD 01/14/2018, 8:59 AM

## 2018-01-14 ENCOUNTER — Encounter (HOSPITAL_COMMUNITY): Payer: Self-pay | Admitting: Psychiatry

## 2018-01-14 ENCOUNTER — Ambulatory Visit (INDEPENDENT_AMBULATORY_CARE_PROVIDER_SITE_OTHER): Payer: No Typology Code available for payment source | Admitting: Psychiatry

## 2018-01-14 VITALS — BP 126/85 | HR 84 | Ht 65.0 in | Wt 214.0 lb

## 2018-01-14 DIAGNOSIS — F331 Major depressive disorder, recurrent, moderate: Secondary | ICD-10-CM

## 2018-01-14 NOTE — Patient Instructions (Addendum)
1.Continueduloxetine 120 mg daily 2. Continue Wellbutrin 300 mg daily  3.Return to clinic intwo months for 15 mins

## 2018-01-17 ENCOUNTER — Other Ambulatory Visit: Payer: Self-pay | Admitting: Family Medicine

## 2018-01-20 MED FILL — VIT D2 1.25 MG (50,000 UNIT: 1.25 MG | 84 days supply | Qty: 12 | Fill #0

## 2018-01-21 ENCOUNTER — Encounter: Payer: Self-pay | Admitting: Family Medicine

## 2018-01-23 ENCOUNTER — Encounter: Payer: Self-pay | Admitting: Family Medicine

## 2018-01-23 ENCOUNTER — Ambulatory Visit (INDEPENDENT_AMBULATORY_CARE_PROVIDER_SITE_OTHER): Payer: Self-pay | Admitting: Family Medicine

## 2018-01-23 VITALS — BP 118/82 | HR 87 | Resp 16 | Ht 65.0 in | Wt 212.0 lb

## 2018-01-23 DIAGNOSIS — D539 Nutritional anemia, unspecified: Secondary | ICD-10-CM

## 2018-01-23 DIAGNOSIS — Z0000 Encounter for general adult medical examination without abnormal findings: Secondary | ICD-10-CM

## 2018-01-23 DIAGNOSIS — E559 Vitamin D deficiency, unspecified: Secondary | ICD-10-CM

## 2018-01-23 DIAGNOSIS — E1169 Type 2 diabetes mellitus with other specified complication: Secondary | ICD-10-CM

## 2018-01-23 DIAGNOSIS — R42 Dizziness and giddiness: Secondary | ICD-10-CM

## 2018-01-23 DIAGNOSIS — E669 Obesity, unspecified: Secondary | ICD-10-CM

## 2018-01-23 DIAGNOSIS — I10 Essential (primary) hypertension: Secondary | ICD-10-CM

## 2018-01-23 DIAGNOSIS — R002 Palpitations: Secondary | ICD-10-CM

## 2018-01-23 DIAGNOSIS — E785 Hyperlipidemia, unspecified: Secondary | ICD-10-CM

## 2018-01-23 LAB — HM DIABETES EYE EXAM

## 2018-01-23 NOTE — Assessment & Plan Note (Signed)

## 2018-01-23 NOTE — Assessment & Plan Note (Signed)
4 month h/o intermittent light headedness

## 2018-01-23 NOTE — Assessment & Plan Note (Addendum)
4 month h/o intermittent palpitations,has been evaluated in the past by cardiology for this and was iniially started on metoprolol by cardiology because of palpitations. She reports worsening symptoms in past 4 months, needs re evaluation by  cardiology

## 2018-01-23 NOTE — Progress Notes (Signed)
Mary Roman     MRN: [id number]      DOB: 02/24/1971  HPI: Patient is in for annual physical exam. C/o intermittent palpitations and lightheadedness x 4  months, wants re evaluation with cardiology Still working with psychiatry as far as medication is concerned, not doing very well, always itching inside, very de motivated, low energy, not actively suicidal  PE: BP 118/82   Pulse 87   Resp 16   Ht 5\' 5"  (1.651 m)   Wt 212 lb (96.2 kg)   SpO2 98%   BMI 35.28 kg/m  Pleasant  female, alert and oriented x 3, in no cardio-pulmonary distress. Afebrile. HEENT No facial trauma or asymetry. Sinuses non tender.  Extra occullar muscles intact, pupils equally reactive to light. External ears normal, tympanic membranes clear. Oropharynx moist, no exudate. Neck: supple, no adenopathy,JVD or thyromegaly.No bruits.  Chest: Clear to ascultation bilaterally.No crackles or wheezes. Non tender to palpation  Breast: Not examined, mammogram is up to date and is normal  Cardiovascular system; Heart sounds normal,  S1 and  S2 ,no S3.  No murmur, or thrill. Apical beat not displaced Peripheral pulses normal.  Abdomen: Soft, non tender, no organomegaly or masses. No bruits. Bowel sounds normal. No guarding, tenderness or rebound.  Rectal:  Needs to return 3 stool cards  GU: Asymptomatic and pap  Is up to date No exam done   Musculoskeletal exam: Decreased  ROM of spine,normal in  hips , shoulders and knees. No deformity ,swelling or crepitus noted. No muscle wasting or atrophy.   Neurologic: Cranial nerves 2 to 12 intact. Power, tone ,sensation and reflexes normal throughout. No disturbance in gait. No tremor.  Skin: Intact, no ulceration, erythema , scaling or rash noted. Pigmentation normal throughout  Psych; Depressed and anxious  mood and affect. Judgement and concentration normal   Assessment & Plan:  Annual physical exam Annual exam as  documented. Counseling done  re healthy lifestyle involving commitment to 150 minutes exercise per week, heart healthy diet, and attaining healthy weight.The importance of adequate sleep also discussed. Regular seat belt use and home safety, is also discussed. Changes in health habits are decided on by the patient with goals and time frames  set for achieving them. Immunization and cancer screening needs are specifically addressed at this visit.   Light headedness 4 month h/o intermittent light headedness  Palpitations 4 month h/o intermittent palpitations,has been evaluated in the past by cardiology for this and was iniially started on metoprolol by cardiology because of palpitations. She reports worsening symptoms in past 4 months, needs re evaluation by  cardiology  Diabetes mellitus type 2 in obese [facility]) Mary Roman is reminded of the importance of commitment to daily physical activity for 30 minutes or more, as able and the need to limit carbohydrate intake to 30 to 60 grams per meal to help with blood sugar control.   The need to take medication as prescribed, test blood sugar as directed, and to call between visits if there is a concern that blood sugar is uncontrolled is also discussed.   Mary Roman is reminded of the importance of daily foot exam, annual eye examination, and good blood sugar, blood pressure and cholesterol control.  Diabetic Labs Latest Ref Rng & Units 01/24/2018 08/31/2017 01/08/2017 12/12/2016 09/11/2016  HbA1c <5.7 % of total Hgb - 6.7(H) 7.1(H) - -  Microalbumin Not Estab. ug/mL 13.9(H) - - 2.7 26.9(H)  Micro/Creat Ratio 0.0 - 30.0 mg/g creat 9.0 - -  9 -  Chol <200 mg/dL - - 157 - -  HDL >50 mg/dL - - 37(L) - -  Calc LDL <100 mg/dL - - 107(H) - -  Triglycerides <150 mg/dL - - 64 - -  Creatinine 0.50 - 1.10 mg/dL - 0.93 0.88 - -   BP/Weight 01/23/2018 12/05/2017 09/02/2017 06/06/2017 05/14/2017 03/14/2017 10/08/8116  Systolic BP 867 737 366 815 - 947 076   Diastolic BP 82 82 84 78 - 82 70  Wt. (Lbs) 212 210 212 214 219.04 221 224  BMI 35.28 34.95 35.28 35.61 36.45 36.78 37.28  Some encounter information is confidential and restricted. Go to Review Flowsheets activity to see all data.   Foot/eye exam completion dates Latest Ref Rng & Units 01/23/2018 03/14/2017  Eye Exam No Retinopathy - -  Foot Form Completion - Done Done

## 2018-01-23 NOTE — Patient Instructions (Signed)
F/U as before  Microalb today   Please work on weight loss   You are referred to cardiology  Fasting lipid, cmp and EGFR, HBA1C,CBC, iron, B12 , TSH, Vit D 5 days before next visit  It is important that you exercise regularly at least 30 minutes 5 times a week. If you develop chest pain, have severe difficulty breathing, or feel very tired, stop exercising immediately and seek medical attention  Please work on good  health habits so that your health will improve. 1. Commitment to daily physical activity for 30 to 60  minutes, if you are able to do this.  2. Commitment to wise food choices. Aim for half of your  food intake to be vegetable and fruit, one quarter starchy foods, and one quarter protein. Try to eat on a regular schedule  3 meals per day, snacking between meals should be limited to vegetables or fruits or small portions of nuts. 64 ounces of water per day is generally recommended, unless you have specific health conditions, like heart failure or kidney failure where you will need to limit fluid intake.  3. Commitment to sufficient and a  good quality of physical and mental rest daily, generally between 6 to 8 hours per day.  WITH PERSISTANCE AND PERSEVERANCE, THE IMPOSSIBLE , BECOMES THE NORM!

## 2018-01-24 ENCOUNTER — Other Ambulatory Visit (HOSPITAL_COMMUNITY)
Admission: RE | Admit: 2018-01-24 | Discharge: 2018-01-24 | Disposition: A | Discharge status: Home / Self Care | Payer: Self-pay | Source: Other Acute Inpatient Hospital | Attending: Family Medicine | Admitting: Family Medicine

## 2018-01-24 DIAGNOSIS — E669 Obesity, unspecified: Secondary | ICD-10-CM | POA: Insufficient documentation

## 2018-01-24 DIAGNOSIS — E1169 Type 2 diabetes mellitus with other specified complication: Secondary | ICD-10-CM | POA: Insufficient documentation

## 2018-01-24 DIAGNOSIS — Z6835 Body mass index (BMI) 35.0-35.9, adult: Secondary | ICD-10-CM | POA: Insufficient documentation

## 2018-01-25 LAB — MICROALBUMIN / CREATININE URINE RATIO
CREATININE, UR: 154.7 mg/dL
MICROALB UR: 13.9 ug/mL — AB
MICROALB/CREAT RATIO: 9 mg/g{creat} (ref 0.0–30.0)

## 2018-01-25 NOTE — Assessment & Plan Note (Signed)
Mary Roman is reminded of the importance of commitment to daily physical activity for 30 minutes or more, as able and the need to limit carbohydrate intake to 30 to 60 grams per meal to help with blood sugar control.   The need to take medication as prescribed, test blood sugar as directed, and to call between visits if there is a concern that blood sugar is uncontrolled is also discussed.   Mary Roman is reminded of the importance of daily foot exam, annual eye examination, and good blood sugar, blood pressure and cholesterol control.  Diabetic Labs Latest Ref Rng & Units 01/24/2018 08/31/2017 01/08/2017 12/12/2016 09/11/2016  HbA1c <5.7 % of total Hgb - 6.7(H) 7.1(H) - -  Microalbumin Not Estab. ug/mL 13.9(H) - - 2.7 26.9(H)  Micro/Creat Ratio 0.0 - 30.0 mg/g creat 9.0 - - 9 -  Chol <200 mg/dL - - 157 - -  HDL >50 mg/dL - - 37(L) - -  Calc LDL <100 mg/dL - - 107(H) - -  Triglycerides <150 mg/dL - - 64 - -  Creatinine 0.50 - 1.10 mg/dL - 0.93 0.88 - -   BP/Weight 01/23/2018 12/05/2017 09/02/2017 06/06/2017 05/14/2017 03/14/2017 1/73/5670  Systolic BP 141 030 131 438 - 887 579  Diastolic BP 82 82 84 78 - 82 70  Wt. (Lbs) 212 210 212 214 219.04 221 224  BMI 35.28 34.95 35.28 35.61 36.45 36.78 37.28  Some encounter information is confidential and restricted. Go to Review Flowsheets activity to see all data.   Foot/eye exam completion dates Latest Ref Rng & Units 01/23/2018 03/14/2017  Eye Exam No Retinopathy - -  Foot Form Completion - Done Done

## 2018-01-27 ENCOUNTER — Ambulatory Visit (INDEPENDENT_AMBULATORY_CARE_PROVIDER_SITE_OTHER): Payer: No Typology Code available for payment source | Admitting: Psychiatry

## 2018-01-27 ENCOUNTER — Encounter (HOSPITAL_COMMUNITY): Payer: Self-pay | Admitting: Psychiatry

## 2018-01-27 DIAGNOSIS — F419 Anxiety disorder, unspecified: Secondary | ICD-10-CM | POA: Diagnosis not present

## 2018-01-27 DIAGNOSIS — F331 Major depressive disorder, recurrent, moderate: Secondary | ICD-10-CM | POA: Diagnosis not present

## 2018-01-27 NOTE — Progress Notes (Signed)
Patient:  Mary Roman   DOB: 01-Jun-1971  MR Number: [id number]    Location: [place]:  [address] [place],  [place], [address]                         Start: Monday 01/27/2018 8:05 AM  End: Monday 01/27/2018 9:00 AM   Provider/Observer:     Maurice Small, MSW, LCSW   Chief Complaint:           Depression/anxiety              Reason For Service:     Mary Roman is a 47 y.o. female who is referred for services by psychiatrist Dr. Modesta Messing. Patient reports trying to cope with anxiety, depression, and everyday stress. She states having problems with finances and occasionally work. She says most of her stress is related to not having enough time in the day to do what she needs to do. 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 Patient has 2 children  and a fiance. She is the sole breadwinner for her home. She states having difficulty getting out of bed in the mornings, having to really push herself, having no energy, poor motivation, diminished interest in activities, and sleeping excessively.      Interventions Strategy:  Supportive/CBT  Participation Level:   Active  Participation Quality:  Appropriate      Behavioral Observation:  Casual, Alert, and depressed      Current Psychosocial Factors: Caretaker responsibilities for mother who had a stroke 5 years ago, financial stress, discord with siblings, job stress, discord in relationship with fiancee, financial stress  Content of Session:   Reviewed homework, praised and reinforced patient's efforts to complete homework, discussed effects on patient's mood/thoughts/behaviors, praised and reinforced her efforts to set limits with her daughter and mother, assigned patient to schedule time for self to do an activity she likes alone 2 x before next session,assisted patient identify and address thoughts and processes that could inhibit implementation of plan,  discussed stressors, facilitated  expression of thoughts and feelings, validated feelings, discussed and did role play to assist patient improve assertive communication with fiancee about her niece possibly visiting this weekend, assigned patient to implement strategies discussed in session   Current Status:    depressed mood, poor motivation,  anxiety, muscle tension, excessive worrying  Suicidal/Homicidal:   No  Patient Progress:   Patient last was seen about 2 weeks ago. She reports continued symptoms of depression. However, she did implement plan of resuming her hobby of going to yard sales. She went once instead of twice but reports enjoying the time she went. She also reports being able to set limits with daughter and mother. She reported feeling guilty for doing this but being pleased with her efforts.  She reports stress related to her 18 yo niece coming this weekend for 2 weeks as patient promised her last year she could do this. Patient expresses ambivalent feelings regarding this as she and fiancee have tension in their relationship and he does not seem to be in agreement for the visit.    Target Goals:   1. Learn and implement calming strategies to reduce/manage overall anxiety.    2. Learn and implement behavioral strategies to overcome depression.    3.Verbalize an understanding and resolution of current interpersonal problems.    4. Identify and replace thoughts and beliefs that support depression/anxiety.  Last Reviewed:   02/28/2017  Goals Addressed Today:    1,2,3,4  Plan:      Return again in 2  weeks.  Impression/Diagnosis:  Patient presents with a history of symptoms of depression that initially began about 5 years ago after her mother had a stroke. Symptoms have waxed and waned since that time. She and one of her siblings assumed most of the caretaker responsibilities for her mother. Symptoms worsened in the past couple of months as her sibling who had helped her with mother moved to [place] and no longer is  as available to assist patient with their mother's care. Patient also has multiple other stressors. Her symptoms include low energy depressed mood, poor concentration, feelings of hopelessness, irritability, changes in appetite, sleep difficulty, feelings of worthlessness, tearfulness, anxiety, muscle tension, excessive worrying                    Diagnosis:  Axis I: Major depressive disorder, recurrent, moderate          Axis II: Deferred  Arcelia Pals, LCSW 01/27/2018

## 2018-02-03 ENCOUNTER — Telehealth: Payer: Self-pay

## 2018-02-03 NOTE — Telephone Encounter (Signed)
Spoke with patient and let her know she needs to come to the office to pick up 3 stool cards to complete at home and bring back to the office for testing and this will complete her physical exam.

## 2018-02-05 NOTE — Telephone Encounter (Signed)
Spoke with patient and she was called to the office to collect 3 stool cards but she said she has tried it before and she just cannot do it and wants to know if there are any other options

## 2018-02-05 NOTE — Telephone Encounter (Signed)
Not sure about coverage under age 48 for cologuard will need to look into that, let her know please

## 2018-02-06 NOTE — Telephone Encounter (Signed)
Let her know

## 2018-02-07 ENCOUNTER — Other Ambulatory Visit: Payer: Self-pay

## 2018-02-07 MED ORDER — TEMAZEPAM 30 MG PO CAPS: ORAL_CAPSULE | ORAL | 1 refills | Status: DC

## 2018-02-10 ENCOUNTER — Ambulatory Visit (INDEPENDENT_AMBULATORY_CARE_PROVIDER_SITE_OTHER): Payer: No Typology Code available for payment source | Admitting: Psychiatry

## 2018-02-10 ENCOUNTER — Encounter (HOSPITAL_COMMUNITY): Payer: Self-pay | Admitting: Psychiatry

## 2018-02-10 DIAGNOSIS — F331 Major depressive disorder, recurrent, moderate: Secondary | ICD-10-CM | POA: Diagnosis not present

## 2018-02-10 MED FILL — TEMAZEPAM 30 MG CAPSULE: 30 | 90 days supply | Qty: 90 | Fill #0

## 2018-02-10 NOTE — Progress Notes (Signed)
Patient:  Mary Roman   DOB: Dec 24, 1970  MR Number: [id number]    Location: [place]:  [address] [place],  [place], [address]                         Start: Monday 02/10/2018 8:15 AM  End: Monday 02/10/2018 9:05 AM                Provider/Observer:     Maurice Small, MSW, LCSW   Chief Complaint:           Depression/anxiety              Reason For Service:     [name] is a 47 y.o. female who is referred for services by psychiatrist Dr. Modesta Messing. Patient reports trying to cope with anxiety, depression, and everyday stress. She states having problems with finances and occasionally work. She says most of her stress is related to not having enough time in the day to do what she needs to do. 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 Patient has 2 children  and a fiance. She is the sole breadwinner for her home. She states having difficulty getting out of bed in the mornings, having to really push herself, having no energy, poor motivation, diminished interest in activities, and sleeping excessively.      Interventions Strategy:  Supportive/CBT  Participation Level:   Active  Participation Quality:  Appropriate      Behavioral Observation:  Casual, Alert, and depressed      Current Psychosocial Factors: Caretaker responsibilities for mother who had a stroke 5 years ago, financial stress, discord with siblings, job stress, discord in relationship with fiancee, financial stress  Content of Session:   Reviewed homework, praised and reinforced patient's efforts to complete homework, discussed effects on patient's mood/thoughts/behaviors, praised and reinforced patient's use of assertiveness skills, discussed effects on her mood and interaction with fiancee, praised and reinforcer her efforts to set limits with both her children, assisted patient identify targets regarding interaction with her family and set a goal of patient having  consistent engagement, developed plan with patient to do two activities with her family before next session, assisted patient identify target regarding improving interaction with fiance and set goal of patient having more frequent positive communication with fiancee, assisted patient develop plan to schedule 2 "talking dates" with fiancee before next session, assisted patient identify ways to make requests to fiancee, assisted patient identify ways to maintain "alone" time, assisted patient develop plan to schedule time for self 2 x before next session, discussed using  manualized approach to tx beginning next session,   Current Status:    less depressed mood, improved motivation, continued anxiety, muscle tension, excessive worrying  Suicidal/Homicidal:   No  Patient Progress:   Patient last was seen about 2 weeks ago. She states a little improvement in mood and sleeping patterns. She  apologized to fiancee about her previous communication and reports then having assertive communication with fiancee regarding niece's visit. She reports conversation was much better than she expected. He was more supportive per her report. She reports then having conflict this past weekend and admits her approach was more aggressive. She has set limits with both her children and says this is going well. She has been more engaged since her niece has been visiting. Instead of going to her room when she gets home from work,  she has been cooking and talking with the family per her report. She also has enjoyed having time for self and has been reading. She also reports spending time with her sister and another family member.   Target Goals:   1. Learn and implement calming strategies to reduce/manage overall anxiety.    2. Learn and implement behavioral strategies to overcome depression.    3.Verbalize an understanding and resolution of current interpersonal problems.    4. Identify and replace thoughts and beliefs that  support depression/anxiety.  Last Reviewed:   02/28/2017  Goals Addressed Today:    1,2,3,4  Plan:      Return again in 2  weeks.  Impression/Diagnosis:  Patient presents with a history of symptoms of depression that initially began about 5 years ago after her mother had a stroke. Symptoms have waxed and waned since that time. She and one of her siblings assumed most of the caretaker responsibilities for her mother. Symptoms worsened in the past couple of months as her sibling who had helped her with mother moved to [place] and no longer is as available to assist patient with their mother's care. Patient also has multiple other stressors. Her symptoms include low energy depressed mood, poor concentration, feelings of hopelessness, irritability, changes in appetite, sleep difficulty, feelings of worthlessness, tearfulness, anxiety, muscle tension, excessive worrying                    Diagnosis:  Axis I: Major depressive disorder, recurrent, moderate          Axis II: Deferred  Scotty Pinder, LCSW 02/10/2018

## 2018-02-24 ENCOUNTER — Ambulatory Visit (HOSPITAL_COMMUNITY): Payer: No Typology Code available for payment source | Admitting: Psychiatry

## 2018-03-06 ENCOUNTER — Ambulatory Visit (INDEPENDENT_AMBULATORY_CARE_PROVIDER_SITE_OTHER): Payer: No Typology Code available for payment source | Admitting: Psychiatry

## 2018-03-06 ENCOUNTER — Encounter (HOSPITAL_COMMUNITY): Payer: Self-pay | Admitting: Psychiatry

## 2018-03-06 ENCOUNTER — Encounter: Payer: Self-pay | Admitting: Family Medicine

## 2018-03-06 ENCOUNTER — Ambulatory Visit (INDEPENDENT_AMBULATORY_CARE_PROVIDER_SITE_OTHER): Payer: Self-pay | Admitting: Family Medicine

## 2018-03-06 VITALS — BP 124/88 | HR 83 | Ht 65.0 in | Wt 215.0 lb

## 2018-03-06 VITALS — BP 124/88 | HR 83 | Resp 16 | Ht 65.0 in | Wt 215.0 lb

## 2018-03-06 DIAGNOSIS — R52 Pain, unspecified: Secondary | ICD-10-CM

## 2018-03-06 DIAGNOSIS — F332 Major depressive disorder, recurrent severe without psychotic features: Secondary | ICD-10-CM

## 2018-03-06 DIAGNOSIS — D509 Iron deficiency anemia, unspecified: Secondary | ICD-10-CM

## 2018-03-06 DIAGNOSIS — I10 Essential (primary) hypertension: Secondary | ICD-10-CM

## 2018-03-06 DIAGNOSIS — R450 Nervousness: Secondary | ICD-10-CM

## 2018-03-06 DIAGNOSIS — Z811 Family history of alcohol abuse and dependence: Secondary | ICD-10-CM | POA: Diagnosis not present

## 2018-03-06 DIAGNOSIS — E669 Obesity, unspecified: Secondary | ICD-10-CM

## 2018-03-06 DIAGNOSIS — F331 Major depressive disorder, recurrent, moderate: Secondary | ICD-10-CM

## 2018-03-06 DIAGNOSIS — G4700 Insomnia, unspecified: Secondary | ICD-10-CM | POA: Diagnosis not present

## 2018-03-06 DIAGNOSIS — E1169 Type 2 diabetes mellitus with other specified complication: Secondary | ICD-10-CM

## 2018-03-06 DIAGNOSIS — Z818 Family history of other mental and behavioral disorders: Secondary | ICD-10-CM

## 2018-03-06 MED ORDER — DULOXETINE HCL 60 MG PO CPEP: 60.0000 mg | ORAL_CAPSULE | Freq: Every day | ORAL | 0 refills | Status: DC

## 2018-03-06 MED ORDER — HYDROCODONE-ACETAMINOPHEN 5-325 MG PO TABS: ORAL_TABLET | ORAL | 0 refills | Status: AC

## 2018-03-06 MED ORDER — BUPROPION HCL ER (XL) 300 MG PO TB24: 300.0000 mg | ORAL_TABLET | Freq: Every day | ORAL | 0 refills | Status: DC

## 2018-03-06 MED ORDER — HYDROCODONE-ACETAMINOPHEN 5-325 MG PO TABS: ORAL_TABLET | ORAL | 0 refills | Status: DC

## 2018-03-06 NOTE — Progress Notes (Addendum)
BH MD/PA/NP OP Progress Note  03/06/2018 10:38 AM Mary Roman  MRN:  [id number]  Chief Complaint:  HPI:  Patient presents for follow-up appointment for depression.  This appointment was made urgently due to worsening in depression.  She states that she has been having worsening depression, especially over the past week.  Although she was taking vacation, she stays in the bed most of the time.  Although she denies any recent triggers, she feels that everything is bothering more compared to before. She tends to be angry or sad easily even with things she used to take care without any difficulty. She has crying spells. She has been late for the job, although it never happened before. She has passive SI. Although she denies any intent/plans, she wants to escape from the way she feels. She is very concerned to be remain out from work as it would be a burden to her colleague. She reports insomnia. She feels fatigue. She has difficulty in concentration. She feels anxious, tense. She denies panic attacks.   Visit Diagnosis:    ICD-10-CM   1. Severe episode of recurrent major depressive disorder, without psychotic features ([place]) F33.2     Past Psychiatric History: Please see initial evaluation for full details. I have reviewed the history. No updates at this time.     Past Medical History:  Past Medical History:  Diagnosis Date  . Anxiety   . Chest tightness, discomfort, or pressure 05/2010   Emergency Department   . Chronic back pain    with disc disease   . Diabetes mellitus, type II ([place])   . Headache(784.0)   . Hypertension   . Obesity   . Palpitation   . Thyromegaly    TSH-0.8 and 07/2010    Past Surgical History:  Procedure Laterality Date  . None      Family Psychiatric History: Please see initial evaluation for full details. I have reviewed the history. No updates at this time.     Family History:  Family History  Problem Relation Age of Onset  . Diabetes Mother   .  Hypertension Mother   . Stroke Mother   . Colon cancer Mother   . Cancer Mother 10       colon localized  . Alcohol abuse Mother   . Pneumonia Father   . Hypertension Sister   . Anxiety disorder Sister   . Hypertension Sister   . Leukemia Sister   . Diabetes Maternal Grandmother   . Hypertension Maternal Grandmother   . Hypertension Maternal Grandfather   . Heart attack Maternal Uncle     Social History:  Social History   Socioeconomic History  . Marital status: Single    Spouse name: Not on file  . Number of children: 2  . Years of education: Not on file  . Highest education level: Not on file  Occupational History  . 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 Financial resource strain: Not on file  . Food insecurity:    Worry: Not on file    Inability: Not on file  . Transportation needs:    Medical: Not on file    Non-medical: Not on file  Tobacco Use  . Smoking status: Never Smoker  . Smokeless tobacco: Never Used  Substance and Sexual Activity  . Alcohol use: No    Comment: 01-17-2017 per pt no  . Drug use: No    Comment: 01-17-2017 per pt no  . Sexual  activity: Yes    Partners: Male    Birth control/protection: Other-see comments    Comment: bf had vasectomy  Lifestyle  . Physical activity:    Days per week: Not on file    Minutes per session: Not on file  . Stress: Not on file  Relationships  . Social connections:    Talks on phone: Not on file    Gets together: Not on file    Attends religious service: Not on file    Active member of club or organization: Not on file    Attends meetings of clubs or organizations: Not on file    Relationship status: Not on file  Other Topics Concern  . Not on file  Social History Narrative  . Not on file    Allergies: No Known Allergies  Metabolic Disorder Labs: Lab Results  Component Value Date   HGBA1C 6.7 (H) 08/31/2017   MPG 146 08/31/2017   MPG 157 01/08/2017   No results found for:  PROLACTIN Lab Results  Component Value Date   CHOL 157 01/08/2017   TRIG 64 01/08/2017   HDL 37 (L) 01/08/2017   CHOLHDL 4.2 01/08/2017   VLDL 13 01/08/2017   LDLCALC 107 (H) 01/08/2017   LDLCALC 104 09/16/2015   Lab Results  Component Value Date   TSH 0.95 01/08/2017   TSH 1.198 02/25/2014    Therapeutic Level Labs: No results found for: LITHIUM No results found for: VALPROATE No components found for:  CBMZ  Current Medications: Current Outpatient Medications  Medication Sig Dispense Refill  . buPROPion (WELLBUTRIN XL) 300 MG 24 hr tablet Take 1 tablet (300 mg total) by mouth daily. 90 tablet 0  . Cyanocobalamin (VITAMIN B-12 PO) Take by mouth.    . DULoxetine (CYMBALTA) 30 MG capsule Take 1 capsule (30 mg total) by mouth daily. 90 capsule 0  . DULoxetine (CYMBALTA) 60 MG capsule Take 1 capsule (60 mg total) by mouth daily. 120 capsule 0  . HYDROcodone-acetaminophen (NORCO/VICODIN) 5-325 MG tablet One tablet once daily 30 tablet 0  . HYDROcodone-acetaminophen (NORCO/VICODIN) 5-325 MG tablet One tablet at bedtime 30 tablet 0  . [START ON 03/13/2018] HYDROcodone-acetaminophen (NORCO/VICODIN) 5-325 MG tablet Take one tablet once daily for back pain 30 tablet 0  . [START ON 04/13/2018] HYDROcodone-acetaminophen (NORCO/VICODIN) 5-325 MG tablet Take one tablet once daily for back pain 30 tablet 0  . [START ON 05/13/2018] HYDROcodone-acetaminophen (NORCO/VICODIN) 5-325 MG tablet Take one tablet once daily for back pain 30 tablet 0  . metoprolol tartrate (LOPRESSOR) 25 MG tablet TAKE 1 TABLET BY MOUTH DAILY. 90 tablet 1  . ondansetron (ZOFRAN) 4 MG tablet Take 1 tablet (4 mg total) by mouth every 8 (eight) hours as needed for nausea or vomiting. 20 tablet 0  . phentermine (ADIPEX-P) 37.5 MG tablet Take 1 tablet (37.5 mg total) by mouth daily before breakfast. 30 tablet 2  . temazepam (RESTORIL) 30 MG capsule TAKE 1 CAPSULE BY MOUTH ONCE DAILY AT BEDTIME AS NEEDED FOR SLEEP 90 capsule 1   . triamterene-hydrochlorothiazide (MAXZIDE-25) 37.5-25 MG tablet Take 1 tablet by mouth daily. 90 tablet 3  . glipiZIDE (GLUCOTROL XL) 2.5 MG 24 hr tablet Take 1 tablet (2.5 mg total) by mouth daily with breakfast. 90 tablet 1   No current facility-administered medications for this visit.      Musculoskeletal: Strength & Muscle Tone: within normal limits Gait & Station: normal Patient leans: N/A  Psychiatric Specialty Exam: Review of Systems  Psychiatric/Behavioral: Positive for  depression, memory loss and suicidal ideas. Negative for hallucinations and substance abuse. The patient is nervous/anxious and has insomnia.   All other systems reviewed and are negative.   Blood pressure 124/88, pulse 83, height 5\' 5"  (1.651 m), weight 215 lb (97.5 kg), SpO2 96 %.Body mass index is 35.78 kg/m.  General Appearance: Fairly Groomed  Eye Contact:  Good  Speech:  Clear and Coherent  Volume:  Normal  Mood:  Depressed  Affect:  Appropriate, Congruent, Restricted and Tearful  Thought Process:  Coherent  Orientation:  Full (Time, Place, and Person)  Thought Content: Logical   Suicidal Thoughts:  Yes.  without intent/plan  Homicidal Thoughts:  No  Memory:  Immediate;   Good  Judgement:  Fair  Insight:  Present  Psychomotor Activity:  slightly slow  Concentration:  Concentration: Poor and Attention Span: Poor  Recall:  [place] of Knowledge: Good  Language: Good  Akathisia:  No  Handed:  Right  AIMS (if indicated): not done  Assets:  Communication Skills Desire for Improvement  ADL's:  Intact  Cognition: WNL  Sleep:  Poor   Screenings: GAD-7     Office Visit from 01/08/2017 in [facility]  Total GAD-7 Score  14    PHQ2-9     Office Visit from 03/06/2018 in [facility] Office Visit from 01/23/2018 in [facility] Office Visit from 12/05/2017 in [place] from 11/25/2017 in [place] from 02/14/2017 in [facility]  PHQ-2 Total Score  6  4  6  6  5   PHQ-9 Total Score  24  19  22  18  16        Assessment and Plan:  RAQUELL RICHER is a 47 y.o. year old female with a history of , who presents for follow up appointment for Severe episode of recurrent major depressive disorder, without psychotic features ([place])  # MDD, severe, recurrent without psychotic features Exam is notable for tearfulness and the patient endorses worsening neurovegetative symptoms. She has not uptitrated duloxetine; will uptitrate duloxetine to target residual depression. Will monitor hypertension. Will consider Abilify in the future if she has limited benefit from this medication. Will continue Wellbutrin as adjunctive treatment for depression. No known history of seizure. Discussed behavioral activation.   She will be unable to function well at work given her severity of neurovegetative symptoms, which include mood lability (tearfulness), difficulty in concentration. She is recommended to be out from work until the next appointment with this note Probation officer, or her PCP. Letter is provided.    Plan I have reviewed and updated plans as below 1. Increase duloxetine 120 mg daily  2. ContinueWellbutrin 300mg  daily (could not tolerate higher dose) 3.Keep appointment in August 4. (Continue temazepam 30 mg at night as needed for sleep) - She will continue to see Ms Bynyum for therapy - the patient agreed this note writer to communicate with Dr. Moshe Cipro, her PCP (She is on gabapentin for pain and hydroxyzine)  I have reviewed suicide assessment in detail. No change in the following assessment.   The patient demonstrates the following risk factors for suicide: Chronic risk factors for suicide include: psychiatric disorder of depression, anxietyand chronic pain. Acute risk factorsfor suicide include: N/A. Protective factorsfor this patient include:  positive social support, responsibility to others (children, family), coping skills and hope for the future. Considering these factors, the overall suicide risk at this point appears to be  low. Patient isappropriate for outpatient follow up.  The duration of this appointment visit was 30 minutes of face-to-face time with the patient.  Greater than 50% of this time was spent in counseling, explanation of  diagnosis, planning of further management, and coordination of care.  [name], MD 03/06/2018, 10:38 AM

## 2018-03-06 NOTE — Patient Instructions (Signed)
F/U inn 12 weeks, call if you need me sooner.  Please get labs drawn today as soon as you leave at [facility]  Please work on good  health habits so that your health will improve. 1. Commitment to daily physical activity for 30 to 60  minutes, if you are able to do this.  2. Commitment to wise food choices. Aim for half of your  food intake to be vegetable and fruit, one quarter starchy foods, and one quarter protein. Try to eat on a regular schedule  3 meals per day, snacking between meals should be limited to vegetables or fruits or small portions of nuts. 64 ounces of water per day is generally recommended, unless you have specific health conditions, like heart failure or kidney failure where you will need to limit fluid intake.  3. Commitment to sufficient and a  good quality of physical and mental rest daily, generally between 6 to 8 hours per day.  WITH PERSISTANCE AND PERSEVERANCE, THE IMPOSSIBLE , BECOMES THE NORM! It is important that you exercise regularly at least 30 minutes 5 times a week. If you develop chest pain, have severe difficulty breathing, or feel very tired, stop exercising immediately and seek medical attention

## 2018-03-06 NOTE — Progress Notes (Signed)
Fill Date ID Written Drug Qty Days Prescriber Rx # Pharmacy Refill Daily Dose * Pymt Type PMP  02/10/2018  1  12/05/2017  Hydrocodone-Acetamin 5-325 Mg  30.00 30 Ma Sim  [id number]  [facility] ([id number])  1/1 5.00 MME Comm Ins  [place]  02/10/2018  2  02/07/2018  Temazepam 30 Mg Capsule  90.00 90 Ma Sim  [id number]  [facility] ([id number])  1/1 1.50 LME Comm Ins  [place]  01/10/2018  1  12/05/2017  Hydrocodone-Acetamin 5-325 Mg  30.00 30 Ma Sim  [id number]  [facility] ([id number])  1/1 5.00 MME Comm Ins  [place]  12/12/2017  2  09/02/2017  Phentermine 37.5 Mg Tablet  30.00 30 Ma Sim  [id number]  [facility] ([id number])  3/2  Other  [place]  12/10/2017  1  12/05/2017  Hydrocodone-Acetamin 5-325 Mg  30.00 30 Ma Sim  [id number]  [facility] ([id number])  1/1 5.00 MME Comm Ins  [place]  12/05/2017  2  12/05/2017  Phentermine 37.5 Mg Tablet  30.00 30 Ma Sim  [id number]  [facility] ([id number])  1/3  Other  [place]  11/10/2017  1  09/02/2017  Hydrocodone-Acetamin 5-325 Mg  30.00 30 Ma Sim  [id number]  [facility] ([id number])  1/1 5.00 MME Comm Ins  [place]  11/04/2017  2  09/02/2017  Phentermine 37.5 Mg Tablet  30.00 30 Ma Sim  [id number]  [facility] ([id number])  2/2  Other  [place]  10/17/2017  2  06/27/2017  Temazepam 30 Mg Capsule  90.00 90 Ma Sim  [id number]  [facility] ([id number])  2/1 1.50 LME Comm Ins  [place]  10/10/2017  1  09/02/2017  Hydrocodone-Acetamin 5-325 Mg  30.00 30 Ma Sim  [id number]  [facility] ([id number])  1/1 5.00 MME Comm Ins  [place]  09/20/2017  2  09/02/2017  Phentermine 37.5 Mg Tablet  30.00 30 Ma Sim  [id number]  [facility] ([id number])  1/2  Other  [place]  09/12/2017  1  09/02/2017  Hydrocodone-Acetamin 5-325 Mg  30.00 30 Ma Sim  [id number]  [facility] ([id number])  1/1 5.00 MME Comm Ins  [place]  08/19/2017  2  06/27/2017  Phentermine 37.5 Mg Tablet  30.00 30 Ma Sim  [id number]  [facility] ([id number])  2/1  Other    08/13/2017  1  06/06/2017  Hydrocodone-Acetamin 5-325 Mg  30.00

## 2018-03-06 NOTE — Patient Instructions (Addendum)
1. Increase duloxetine 120 mg daily  2. ContinueWellbutrin 300mg  daily (could not tolerate higher dose) 3.Keep appointment in August 4. (Continue temazepam 30 mg at night as needed for sleep)

## 2018-03-07 ENCOUNTER — Other Ambulatory Visit: Payer: Self-pay | Admitting: Family Medicine

## 2018-03-07 LAB — LIPID PANEL
CHOLESTEROL: 154 mg/dL (ref <200)
HDL: 45 mg/dL — ABNORMAL LOW (ref >50)
LDL Cholesterol (Calc): 96 mg/dL (calc)
Non-HDL Cholesterol (Calc): 109 mg/dL (calc) (ref <130)
Total CHOL/HDL Ratio: 3.4 (calc) (ref <5.0)
Triglycerides: 50 mg/dL (ref <150)

## 2018-03-07 LAB — COMPLETE METABOLIC PANEL WITH GFR
AG Ratio: 1.2 (calc) (ref 1.0–2.5)
ALBUMIN MSPROF: 3.8 g/dL (ref 3.6–5.1)
ALKALINE PHOSPHATASE (APISO): 63 U/L (ref 33–115)
ALT: 10 U/L (ref 6–29)
AST: 12 U/L (ref 10–35)
BUN: 14 mg/dL (ref 7–25)
CO2: 31 mmol/L (ref 20–32)
CREATININE: 0.95 mg/dL (ref 0.50–1.10)
Calcium: 9.3 mg/dL (ref 8.6–10.2)
Chloride: 102 mmol/L (ref 98–110)
GFR, Est African American: 82 mL/min/{1.73_m2} (ref >=60)
GFR, Est Non African American: 71 mL/min/{1.73_m2} (ref >=60)
Globulin: 3.1 g/dL (calc) (ref 1.9–3.7)
Glucose, Bld: 128 mg/dL — ABNORMAL HIGH (ref 65–99)
Potassium: 3.6 mmol/L (ref 3.5–5.3)
Sodium: 140 mmol/L (ref 135–146)
Total Bilirubin: 0.4 mg/dL (ref 0.2–1.2)
Total Protein: 6.9 g/dL (ref 6.1–8.1)

## 2018-03-07 LAB — IRON: Iron: 35 ug/dL — ABNORMAL LOW (ref 40–190)

## 2018-03-07 LAB — MICROALBUMIN / CREATININE URINE RATIO
CREATININE, URINE: 166 mg/dL (ref 20–275)
MICROALB/CREAT RATIO: 10 ug/mg{creat} (ref <30)
Microalb, Ur: 1.7 mg/dL

## 2018-03-07 LAB — CBC
HCT: 32.9 % — ABNORMAL LOW (ref 35.0–45.0)
Hemoglobin: 10.9 g/dL — ABNORMAL LOW (ref 11.7–15.5)
MCH: 27.7 pg (ref 27.0–33.0)
MCHC: 33.1 g/dL (ref 32.0–36.0)
MCV: 83.7 fL (ref 80.0–100.0)
MPV: 8.9 fL (ref 7.5–12.5)
PLATELETS: 432 10*3/uL — AB (ref 140–400)
RBC: 3.93 10*6/uL (ref 3.80–5.10)
RDW: 13 % (ref 11.0–15.0)
WBC: 6.6 10*3/uL (ref 3.8–10.8)

## 2018-03-07 LAB — VITAMIN D 25 HYDROXY (VIT D DEFICIENCY, FRACTURES): VIT D 25 HYDROXY: 28 ng/mL — AB (ref 30–100)

## 2018-03-07 LAB — VITAMIN B12: VITAMIN B 12: 271 pg/mL (ref 200–1100)

## 2018-03-07 LAB — HEMOGLOBIN A1C
HEMOGLOBIN A1C: 6.8 %{Hb} — AB (ref <5.7)
Mean Plasma Glucose: 148 (calc)
eAG (mmol/L): 8.2 (calc)

## 2018-03-07 LAB — TSH: TSH: 1.23 m[IU]/L

## 2018-03-09 ENCOUNTER — Other Ambulatory Visit: Payer: Self-pay | Admitting: Family Medicine

## 2018-03-09 ENCOUNTER — Encounter: Payer: Self-pay | Admitting: Family Medicine

## 2018-03-09 DIAGNOSIS — D509 Iron deficiency anemia, unspecified: Secondary | ICD-10-CM | POA: Insufficient documentation

## 2018-03-09 MED ORDER — ERGOCALCIFEROL 1.25 MG (50000 UT) PO CAPS: 50000.0000 [IU] | ORAL_CAPSULE | ORAL | 1 refills | Status: DC

## 2018-03-09 NOTE — Assessment & Plan Note (Signed)
Controlled, no change in medication Mary Roman is reminded of the importance of commitment to daily physical activity for 30 minutes or more, as able and the need to limit carbohydrate intake to 30 to 60 grams per meal to help with blood sugar control.   The need to take medication as prescribed, test blood sugar as directed, and to call between visits if there is a concern that blood sugar is uncontrolled is also discussed.   Mary Roman is reminded of the importance of daily foot exam, annual eye examination, and good blood sugar, blood pressure and cholesterol control.  Diabetic Labs Latest Ref Rng & Units 03/06/2018 01/24/2018 08/31/2017 01/08/2017 12/12/2016  HbA1c <5.7 % of total Hgb 6.8(H) - 6.7(H) 7.1(H) -  Microalbumin mg/dL 1.7 13.9(H) - - 2.7  Micro/Creat Ratio <30 mcg/mg creat 10 9.0 - - 9  Chol <200 mg/dL 154 - - 157 -  HDL >50 mg/dL 45(L) - - 37(L) -  Calc LDL mg/dL (calc) 96 - - 107(H) -  Triglycerides <150 mg/dL 50 - - 64 -  Creatinine 0.50 - 1.10 mg/dL 0.95 - 0.93 0.88 -   BP/Weight 03/06/2018 01/23/2018 12/05/2017 09/02/2017 06/06/2017 05/14/2017 1/61/0960  Systolic BP 454 098 119 147 829 - 562  Diastolic BP 88 82 82 84 78 - 82  Wt. (Lbs) 215 212 210 212 214 219.04 221  BMI 35.78 35.28 34.95 35.28 35.61 36.45 36.78  Some encounter information is confidential and restricted. Go to Review Flowsheets activity to see all data.   Foot/eye exam completion dates Latest Ref Rng & Units 01/23/2018 03/14/2017  Eye Exam No Retinopathy - -  Foot Form Completion - Done Done

## 2018-03-09 NOTE — Progress Notes (Signed)
Mary Roman     MRN: [id number]      DOB: [birth year]   HPI Mary Roman is here for follow up and re-evaluation of chronic medical conditions, medication management and review of any available recent lab and radiology data.  The PT denies any adverse reactions to current medications since the last visit.  She reports  inability to function and no improvement in her mental health despite multiple attempts by Psych at med adjustment , she is not suicidal or homicidal ROS Denies recent fever or chills. Denies sinus pressure, nasal congestion, ear pain or sore throat. Denies chest congestion, productive cough or wheezing. Denies chest pains, palpitations and leg swelling Denies abdominal pain, nausea, vomiting,diarrhea or constipation.   Denies dysuria, frequency, hesitancy or incontinence. Denies uncontrolled  joint pain, swelling and limitation in mobility. Denies headaches, seizures, numbness, or tingling.  Denies skin break down or rash.   PE  BP 124/88   Pulse 83   Resp 16   Ht 5\' 5"  (1.651 m)   Wt 215 lb (97.5 kg)   SpO2 98%   BMI 35.78 kg/m   Patient alert and oriented and in no cardiopulmonary distress.  HEENT: No facial asymmetry, EOMI,   oropharynx pink and moist.  Neck supple no JVD, no mass.  Chest: Clear to auscultation bilaterally.  CVS: S1, S2 no murmurs, no S3.Regular rate.  ABD: Soft non tender.   Ext: No edema  MS: Adequate ROM spine, shoulders, hips and knees.  Skin: Intact, no ulcerations or rash noted.  Psych:Poor eye contact , depressed  Affect. Tearful Memory intact not anxious but very  depressed appearing.  CNS: CN 2-12 intact, power,  normal throughout.no focal deficits noted.   Assessment & Plan  Major depressive disorder, recurrent episode, moderate (HCC) Uncontrolled and unable to function, recommend work excuse until mental health is adequately treated, reached out to her Psychiatrist who was able to see her on the same day  of the visit  Essential hypertension Controlled, no change in medication \ DASH diet and commitment to daily physical activity for a minimum of 30 minutes discussed and encouraged, as a part of hypertension management. The importance of attaining a healthy weight is also discussed.  BP/Weight 03/06/2018 01/23/2018 12/05/2017 09/02/2017 06/06/2017 05/14/2017 0/30/0923  Systolic BP 300 762 263 335 456 - 256  Diastolic BP 88 82 82 84 78 - 82  Wt. (Lbs) 215 212 210 212 214 219.04 221  BMI 35.78 35.28 34.95 35.28 35.61 36.45 36.78  Some encounter information is confidential and restricted. Go to Review Flowsheets activity to see all data.       Morbid obesity due to excess calories (HCC) Deteriorated. Patient re-educated about  the importance of commitment to a  minimum of 150 minutes of exercise per week.  The importance of healthy food choices with portion control discussed. Encouraged to start a food diary, count calories and to consider  joining a support group. Sample diet sheets offered. Goals set by the patient for the next several months.   Weight /BMI 03/06/2018 01/23/2018 12/05/2017  WEIGHT 215 lb 212 lb 210 lb  HEIGHT 5\' 5"  5\' 5"  5\' 5"   BMI 35.78 kg/m2 35.28 kg/m2 34.95 kg/m2  Some encounter information is confidential and restricted. Go to Review Flowsheets activity to see all data.   Phentamine discontinued as no real befit currently, needs psych issue addressed  Diabetes mellitus type 2 in obese (HCC) Controlled, no change in medication Mary Roman is reminded  of the importance of commitment to daily physical activity for 30 minutes or more, as able and the need to limit carbohydrate intake to 30 to 60 grams per meal to help with blood sugar control.   The need to take medication as prescribed, test blood sugar as directed, and to call between visits if there is a concern that blood sugar is uncontrolled is also discussed.   Mary Roman is reminded of the importance of  daily foot exam, annual eye examination, and good blood sugar, blood pressure and cholesterol control.  Diabetic Labs Latest Ref Rng & Units 03/06/2018 01/24/2018 08/31/2017 01/08/2017 12/12/2016  HbA1c <5.7 % of total Hgb 6.8(H) - 6.7(H) 7.1(H) -  Microalbumin mg/dL 1.7 13.9(H) - - 2.7  Micro/Creat Ratio <30 mcg/mg creat 10 9.0 - - 9  Chol <200 mg/dL 154 - - 157 -  HDL >50 mg/dL 45(L) - - 37(L) -  Calc LDL mg/dL (calc) 96 - - 107(H) -  Triglycerides <150 mg/dL 50 - - 64 -  Creatinine 0.50 - 1.10 mg/dL 0.95 - 0.93 0.88 -   BP/Weight 03/06/2018 01/23/2018 12/05/2017 09/02/2017 06/06/2017 05/14/2017 1/68/3729  Systolic BP 021 115 520 802 233 - 612  Diastolic BP 88 82 82 84 78 - 82  Wt. (Lbs) 215 212 210 212 214 219.04 221  BMI 35.78 35.28 34.95 35.28 35.61 36.45 36.78  Some encounter information is confidential and restricted. Go to Review Flowsheets activity to see all data.   Foot/eye exam completion dates Latest Ref Rng & Units 01/23/2018 03/14/2017  Eye Exam No Retinopathy - -  Foot Form Completion - Done Done        IDA (iron deficiency anemia) Needs to commit to twice daily supplemental iron , and will pursue GI eval sooner rather than later , this to be done at next visit  Encounter for pain management The patient's Controlled Substance registry is reviewed and compliance confirmed. Adequacy of  Pain control and level of function is assessed. Medication dosing is adjusted as deemed appropriate. Twelve weeks of medication is prescribed , patient signs for the script and is provided with a follow up appointment between 11 to 12 weeks .

## 2018-03-09 NOTE — Assessment & Plan Note (Signed)
Controlled, no change in medication \ DASH diet and commitment to daily physical activity for a minimum of 30 minutes discussed and encouraged, as a part of hypertension management. The importance of attaining a healthy weight is also discussed.  BP/Weight 03/06/2018 01/23/2018 12/05/2017 09/02/2017 06/06/2017 05/14/2017 1/61/0960  Systolic BP 454 098 119 147 829 - 562  Diastolic BP 88 82 82 84 78 - 82  Wt. (Lbs) 215 212 210 212 214 219.04 221  BMI 35.78 35.28 34.95 35.28 35.61 36.45 36.78  Some encounter information is confidential and restricted. Go to Review Flowsheets activity to see all data.

## 2018-03-09 NOTE — Progress Notes (Signed)
CAMARY SOSA     MRN: [id number]      DOB: [birth year]   HPI Ms. Nettle is here for follow up and re-evaluation of chronic medical conditions, medication management in particular her chronic pain management and review of any available recent lab and radiology data.  Preventive health is updated, specifically  Cancer screening and Immunization.  States unable to do stool testing , since iron deficient may recommend colonoscopy but will do screening rectal at next visit Has upcoming cardiology appt. Notes continued deterioration in level of function and uncontrolled depression though not suicidal or homicidal   ROS Denies recent fever or chills. Denies sinus pressure, nasal congestion, ear pain or sore throat. Denies chest congestion, productive cough or wheezing. Denies chest pains, palpitations and leg swelling Denies abdominal pain, nausea, vomiting,diarrhea or constipation.   Denies dysuria, frequency, hesitancy or incontinence. Denies joint pain, swelling and limitation in mobility. Denies headaches, seizures, numbness, or tingling.  Denies skin break down or rash.   PE  BP 124/88   Pulse 83   Resp 16   Ht 5\' 5"  (1.651 m)   Wt 215 lb (97.5 kg)   SpO2 98%   BMI 35.78 kg/m   Patient alert and oriented and in no cardiopulmonary distress.  HEENT: No facial asymmetry, EOMI,   oropharynx pink and moist.  Neck supple no JVD, no mass.  Chest: Clear to auscultation bilaterally.  CVS: S1, S2 no murmurs, no S3.Regular rate.  ABD: Soft non tender.   Ext: No edema  MS: Adequate ROM spine, shoulders, hips and knees.  Skin: Intact, no ulcerations or rash noted.  Psych: Poor eye contact at times with tear fullness  And flat affect. Very depressed appearing  CNS: CN 2-12 intact, power,  normal throughout.no focal deficits noted.   Assessment & Plan  Major depressive disorder, recurrent episode, moderate (HCC) Uncontrolled and unable to function, recommend work  excuse until mental health is adequately treated, reached out to her Psychiatrist who was able to see her on the same day of the visit  Essential hypertension Controlled, no change in medication \ DASH diet and commitment to daily physical activity for a minimum of 30 minutes discussed and encouraged, as a part of hypertension management. The importance of attaining a healthy weight is also discussed.  BP/Weight 03/06/2018 01/23/2018 12/05/2017 09/02/2017 06/06/2017 05/14/2017 12/14/15  Systolic BP 494 496 759 163 846 - 659  Diastolic BP 88 82 82 84 78 - 82  Wt. (Lbs) 215 212 210 212 214 219.04 221  BMI 35.78 35.28 34.95 35.28 35.61 36.45 36.78  Some encounter information is confidential and restricted. Go to Review Flowsheets activity to see all data.       Morbid obesity due to excess calories (HCC) Deteriorated. Patient re-educated about  the importance of commitment to a  minimum of 150 minutes of exercise per week.  The importance of healthy food choices with portion control discussed. Encouraged to start a food diary, count calories and to consider  joining a support group. Sample diet sheets offered. Goals set by the patient for the next several months.   Weight /BMI 03/06/2018 01/23/2018 12/05/2017  WEIGHT 215 lb 212 lb 210 lb  HEIGHT 5\' 5"  5\' 5"  5\' 5"   BMI 35.78 kg/m2 35.28 kg/m2 34.95 kg/m2  Some encounter information is confidential and restricted. Go to Review Flowsheets activity to see all data.   Phentamine discontinued as no real befit currently, needs psych issue addressed  Diabetes mellitus  type 2 in obese (HCC) Controlled, no change in medication Ms. Hohn is reminded of the importance of commitment to daily physical activity for 30 minutes or more, as able and the need to limit carbohydrate intake to 30 to 60 grams per meal to help with blood sugar control.   The need to take medication as prescribed, test blood sugar as directed, and to call between visits if there  is a concern that blood sugar is uncontrolled is also discussed.   Ms. Klinedinst is reminded of the importance of daily foot exam, annual eye examination, and good blood sugar, blood pressure and cholesterol control.  Diabetic Labs Latest Ref Rng & Units 03/06/2018 01/24/2018 08/31/2017 01/08/2017 12/12/2016  HbA1c <5.7 % of total Hgb 6.8(H) - 6.7(H) 7.1(H) -  Microalbumin mg/dL 1.7 13.9(H) - - 2.7  Micro/Creat Ratio <30 mcg/mg creat 10 9.0 - - 9  Chol <200 mg/dL 154 - - 157 -  HDL >50 mg/dL 45(L) - - 37(L) -  Calc LDL mg/dL (calc) 96 - - 107(H) -  Triglycerides <150 mg/dL 50 - - 64 -  Creatinine 0.50 - 1.10 mg/dL 0.95 - 0.93 0.88 -   BP/Weight 03/06/2018 01/23/2018 12/05/2017 09/02/2017 06/06/2017 05/14/2017 0/62/6948  Systolic BP 546 270 350 093 818 - 299  Diastolic BP 88 82 82 84 78 - 82  Wt. (Lbs) 215 212 210 212 214 219.04 221  BMI 35.78 35.28 34.95 35.28 35.61 36.45 36.78  Some encounter information is confidential and restricted. Go to Review Flowsheets activity to see all data.   Foot/eye exam completion dates Latest Ref Rng & Units 01/23/2018 03/14/2017  Eye Exam No Retinopathy - -  Foot Form Completion - Done Done        IDA (iron deficiency anemia) Needs to commit to twice daily supplemental iron , and will pursue GI eval sooner rather than later , this to be done at next visit  Encounter for pain management The patient's Controlled Substance registry is reviewed and compliance confirmed. Adequacy of  Pain control and level of function is assessed. Medication dosing is adjusted as deemed appropriate. Twelve weeks of medication is prescribed , patient signs for the script and is provided with a follow up appointment between 11 to 12 weeks .

## 2018-03-09 NOTE — Assessment & Plan Note (Signed)
Deteriorated. Patient re-educated about  the importance of commitment to a  minimum of 150 minutes of exercise per week.  The importance of healthy food choices with portion control discussed. Encouraged to start a food diary, count calories and to consider  joining a support group. Sample diet sheets offered. Goals set by the patient for the next several months.   Weight /BMI 03/06/2018 01/23/2018 12/05/2017  WEIGHT 215 lb 212 lb 210 lb  HEIGHT 5\' 5"  5\' 5"  5\' 5"   BMI 35.78 kg/m2 35.28 kg/m2 34.95 kg/m2  Some encounter information is confidential and restricted. Go to Review Flowsheets activity to see all data.   Phentamine discontinued as no real befit currently, needs psych issue addressed

## 2018-03-09 NOTE — Assessment & Plan Note (Signed)
The patient's Controlled Substance registry is reviewed and compliance confirmed. Adequacy of  Pain control and level of function is assessed. Medication dosing is adjusted as deemed appropriate. Twelve weeks of medication is prescribed , patient signs for the script and is provided with a follow up appointment between 11 to 12 weeks .  

## 2018-03-09 NOTE — Assessment & Plan Note (Signed)
Uncontrolled and unable to function, recommend work excuse until mental health is adequately treated, reached out to her Psychiatrist who was able to see her on the same day of the visit

## 2018-03-09 NOTE — Assessment & Plan Note (Signed)
Needs to commit to twice daily supplemental iron , and will pursue GI eval sooner rather than later , this to be done at next visit

## 2018-03-17 ENCOUNTER — Ambulatory Visit (HOSPITAL_COMMUNITY): Payer: No Typology Code available for payment source | Admitting: Psychiatry

## 2018-03-20 ENCOUNTER — Ambulatory Visit (HOSPITAL_COMMUNITY): Payer: Self-pay | Admitting: Psychiatry

## 2018-03-20 ENCOUNTER — Ambulatory Visit: Payer: Self-pay | Admitting: Cardiology

## 2018-03-20 NOTE — Progress Notes (Addendum)
BH MD/PA/NP OP Progress Note  03/24/2018 12:19 PM Mary Roman  MRN:  [id number]  Chief Complaint:  Chief Complaint    Depression; Follow-up     HPI:  Patient presents for follow up appointment for depression. She states that she feels a little better this week, although she was very overwhelmed two weeks ago. She asked her mother in law to take care of her children. She hopes to return to work today as she is also worried about her financial strain. She is also worried that how she should explain to her children about her condition. She feels frustrated with her husband who she does not think understands her mental condition. She feels anxious not to know whether her short term disability to be approved. She has less insomnia. She feels fatigue. She has fair concentration and less crying spells. She denies SI. She denies panic attacks.   Visit Diagnosis:    ICD-10-CM   1. Major depressive disorder, recurrent episode, moderate ([place]) F33.1     Past Psychiatric History: Please see initial evaluation for full details. I have reviewed the history. No updates at this time.     Past Medical History:  Past Medical History:  Diagnosis Date  . Anxiety   . Chest tightness, discomfort, or pressure 05/2010   [facility]gency Department   . Chronic back pain    with disc disease   . Diabetes mellitus, type II ([place])   . Headache(784.0)   . Hypertension   . Obesity   . Palpitation   . Thyromegaly    TSH-0.8 and 07/2010    Past Surgical History:  Procedure Laterality Date  . None      Family Psychiatric History: Please see initial evaluation for full details. I have reviewed the history. No updates at this time.     Family History:  Family History  Problem Relation Age of Onset  . Diabetes Mother   . Hypertension Mother   . Stroke Mother   . Colon cancer Mother   . Cancer Mother 32       colon localized  . Alcohol abuse Mother   . Pneumonia Father   . Hypertension Sister   .  Anxiety disorder Sister   . Hypertension Sister   . Leukemia Sister   . Diabetes Maternal Grandmother   . Hypertension Maternal Grandmother   . Hypertension Maternal Grandfather   . Heart attack Maternal Uncle     Social History:  Social History   Socioeconomic History  . Marital status: Single    Spouse name: Not on file  . Number of children: 2  . Years of education: Not on file  . Highest education level: Not on file  Occupational History  . 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 Financial resource strain: Not on file  . Food insecurity:    Worry: Not on file    Inability: Not on file  . Transportation needs:    Medical: Not on file    Non-medical: Not on file  Tobacco Use  . Smoking status: Never Smoker  . Smokeless tobacco: Never Used  Substance and Sexual Activity  . Alcohol use: No    Comment: 01-17-2017 per pt no  . Drug use: No    Comment: 01-17-2017 per pt no  . Sexual activity: Yes    Partners: Male    Birth control/protection: Other-see comments    Comment: bf had vasectomy  Lifestyle  . Physical activity:  Days per week: Not on file    Minutes per session: Not on file  . Stress: Not on file  Relationships  . Social connections:    Talks on phone: Not on file    Gets together: Not on file    Attends religious service: Not on file    Active member of club or organization: Not on file    Attends meetings of clubs or organizations: Not on file    Relationship status: Not on file  Other Topics Concern  . Not on file  Social History Narrative  . Not on file    Allergies: No Known Allergies  Metabolic Disorder Labs: Lab Results  Component Value Date   HGBA1C 6.8 (H) 03/06/2018   MPG 148 03/06/2018   MPG 146 08/31/2017   No results found for: PROLACTIN Lab Results  Component Value Date   CHOL 154 03/06/2018   TRIG 50 03/06/2018   HDL 45 (L) 03/06/2018   CHOLHDL 3.4 03/06/2018   VLDL 13 01/08/2017   LDLCALC 96  03/06/2018   LDLCALC 107 (H) 01/08/2017   Lab Results  Component Value Date   TSH 1.23 03/06/2018   TSH 0.95 01/08/2017    Therapeutic Level Labs: No results found for: LITHIUM No results found for: VALPROATE No components found for:  CBMZ  Current Medications: Current Outpatient Medications  Medication Sig Dispense Refill  . buPROPion (WELLBUTRIN XL) 300 MG 24 hr tablet Take 1 tablet (300 mg total) by mouth daily. 90 tablet 0  . Cyanocobalamin (VITAMIN B-12 PO) Take by mouth.    . DULoxetine (CYMBALTA) 60 MG capsule Take 1 capsule (60 mg total) by mouth daily. 120 capsule 0  . ergocalciferol (VITAMIN D2) 50000 units capsule Take 1 capsule (50,000 Units total) by mouth once a week. One capsule once weekly 12 capsule 1  . HYDROcodone-acetaminophen (NORCO/VICODIN) 5-325 MG tablet One tablet once daily 30 tablet 0  . HYDROcodone-acetaminophen (NORCO/VICODIN) 5-325 MG tablet Take one tablet once daily for back pain 30 tablet 0  . [START ON 04/13/2018] HYDROcodone-acetaminophen (NORCO/VICODIN) 5-325 MG tablet Take one tablet once daily for back pain 30 tablet 0  . [START ON 05/13/2018] HYDROcodone-acetaminophen (NORCO/VICODIN) 5-325 MG tablet Take one tablet once daily for back pain 30 tablet 0  . metoprolol tartrate (LOPRESSOR) 25 MG tablet TAKE 1 TABLET BY MOUTH DAILY. 90 tablet 1  . ondansetron (ZOFRAN) 4 MG tablet Take 1 tablet (4 mg total) by mouth every 8 (eight) hours as needed for nausea or vomiting. 20 tablet 0  . temazepam (RESTORIL) 30 MG capsule TAKE 1 CAPSULE BY MOUTH ONCE DAILY AT BEDTIME AS NEEDED FOR SLEEP 90 capsule 1  . triamterene-hydrochlorothiazide (MAXZIDE-25) 37.5-25 MG tablet Take 1 tablet by mouth daily. 90 tablet 3  . glipiZIDE (GLUCOTROL XL) 2.5 MG 24 hr tablet Take 1 tablet (2.5 mg total) by mouth daily with breakfast. 90 tablet 1   No current facility-administered medications for this visit.      Musculoskeletal: Strength & Muscle Tone: within normal  limits Gait & Station: normal Patient leans: N/A  Psychiatric Specialty Exam: Review of Systems  Psychiatric/Behavioral: Positive for depression. Negative for hallucinations, memory loss, substance abuse and suicidal ideas. The patient is nervous/anxious. The patient does not have insomnia.   All other systems reviewed and are negative.   Blood pressure 117/79, pulse 82, height 5\' 5"  (1.651 m), weight 214 lb (97.1 kg), SpO2 99 %.Body mass index is 35.61 kg/m.  General Appearance: Fairly Groomed  Eye Contact:  Good  Speech:  Clear and Coherent  Volume:  Normal  Mood:  Depressed  Affect:  Appropriate, Congruent and slightly restricted- improving  Thought Process:  Coherent  Orientation:  Full (Time, Place, and Person)  Thought Content: Logical   Suicidal Thoughts:  No  Homicidal Thoughts:  No  Memory:  Immediate;   Good  Judgement:  Good  Insight:  Fair  Psychomotor Activity:  Normal  Concentration:  Concentration: Good and Attention Span: Good  Recall:  Good  Fund of Knowledge: Good  Language: Good  Akathisia:  No  Handed:  Right  AIMS (if indicated): not done  Assets:  Communication Skills Desire for Improvement  ADL's:  Intact  Cognition: WNL  Sleep:  Fair   Screenings: GAD-7     Office Visit from 01/08/2017 in [facility]  Total GAD-7 Score  14    PHQ2-9     Office Visit from 03/06/2018 in [facility] Office Visit from 01/23/2018 in [facility] Office Visit from 12/05/2017 in [facility] from 11/25/2017 in [facility] from 02/14/2017 in [facility]  PHQ-2 Total Score  6  4  6  6  5   PHQ-9 Total Score  24  19  22  18  16        Assessment and Plan:  Mary Roman is a 47 y.o. year old female with a history of depression, diabetes, who presents for follow up appointment for Major depressive disorder, recurrent  episode, moderate ([place])   # MDD, moderate, recurrent without psychotic features Exam is notable for improved affect reactivity and patient reports overall improvement in neurovegetative symptoms since up titration of duloxetine.  Will continue current dose of duloxetine to target residual neurovegetative symptoms to see if it exerts its full effect.  Will continue Wellbutrin to target depression.  She has no known history of seizure.  Discussed risk of worsening headache and anxiety.  Will consider adding Abilify in the future if she has limited benefit from this current regimen.   Given improvement in severity of her neurovegetative symptoms, she will return to work today. Letter to support this is provided. Discussed risk of relapse. Will also submit paperwork to support recent leave of abcence due to mental condition. She will benefit from taking up to three days per week to be excused from work as needed if any worsening in depression, given she is at risk of relapse on depression. If her depression relapses, it will be difficult for her to complete tasks or collaborate with others at work.    Plan I have reviewed and updated plans as below 1. Continue duloxetine 120 mg daily  2. ContinueWellbutrin 300mg  daily  3.Return to clinic in one month for 15 mins 4. (Continue temazepam 30 mg at night as needed for sleep) - She will continue to see Ms Bynyum for therapy (She is on gabapentin for pain and hydroxyzine)  I have reviewed suicide assessment in detail. No change in the following assessment.   The patient demonstrates the following risk factors for suicide: Chronic risk factors for suicide include: psychiatric disorder of depression, anxietyand chronic pain. Acute risk factorsfor suicide include: N/A. Protective factorsfor this patient include: positive social support, responsibility to others (children, family), coping skills and hope for the future. Considering these factors, the  overall suicide risk at this point appears to be low. Patient isappropriate for outpatient follow up.   [place]  Deontrey Massi, MD 03/24/2018, 12:19 PM

## 2018-03-24 ENCOUNTER — Encounter (HOSPITAL_COMMUNITY): Payer: Self-pay | Admitting: Psychiatry

## 2018-03-24 ENCOUNTER — Ambulatory Visit (INDEPENDENT_AMBULATORY_CARE_PROVIDER_SITE_OTHER): Payer: No Typology Code available for payment source | Admitting: Psychiatry

## 2018-03-24 VITALS — BP 117/79 | HR 82 | Ht 65.0 in | Wt 214.0 lb

## 2018-03-24 DIAGNOSIS — F331 Major depressive disorder, recurrent, moderate: Secondary | ICD-10-CM

## 2018-03-24 MED ORDER — DULOXETINE HCL 60 MG PO CPEP: 60.0000 mg | ORAL_CAPSULE | Freq: Every day | ORAL | 0 refills | Status: DC

## 2018-03-24 MED ORDER — BUPROPION HCL ER (XL) 300 MG PO TB24: 300.0000 mg | ORAL_TABLET | Freq: Every day | ORAL | 0 refills | Status: DC

## 2018-03-24 MED FILL — BUPROPION HCL XL 300 MG TAB: 300 | 90 days supply | Qty: 90 | Fill #0

## 2018-03-24 MED FILL — DULoxetine HCL 60 MG CPEP: 60 | 90 days supply | Qty: 90 | Fill #0

## 2018-03-24 NOTE — Patient Instructions (Signed)
1. Continue duloxetine 120 mg daily  2. ContinueWellbutrin 300mg  daily  3.Return to clinic in one month for 15 mins

## 2018-04-07 ENCOUNTER — Ambulatory Visit (INDEPENDENT_AMBULATORY_CARE_PROVIDER_SITE_OTHER): Payer: No Typology Code available for payment source | Admitting: Psychiatry

## 2018-04-07 ENCOUNTER — Encounter

## 2018-04-07 ENCOUNTER — Encounter (HOSPITAL_COMMUNITY): Payer: Self-pay | Admitting: Psychiatry

## 2018-04-07 DIAGNOSIS — F331 Major depressive disorder, recurrent, moderate: Secondary | ICD-10-CM

## 2018-04-07 NOTE — Progress Notes (Signed)
Patient:  Mary Roman   DOB: [birth year]  MR Number: [id number]    Location: [facility]:  [address] [place],  [place], [address]                         Start: Monday 04/07/2018 8:08 AM  End: Monday 04/07/2018 9:10 AM             Provider/Observer:     Maurice Small, MSW, LCSW   Chief Complaint:           Depression/anxiety              Reason For Service:     Mary Roman is a 47 y.o. female who is referred for services by psychiatrist Dr. Modesta Messing. Patient reports trying to cope with anxiety, depression, and everyday stress. She states having problems with finances and occasionally work. She says most of her stress is related to not having enough time in the day to do what she needs to do. 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 Patient has 2 children  and a fiance. She is the sole breadwinner for her home. She states having difficulty getting out of bed in the mornings, having to really push herself, having no energy, poor motivation, diminished interest in activities, and sleeping excessively.      Interventions Strategy:  Supportive/CBT  Participation Level:   Active  Participation Quality:  Appropriate      Behavioral Observation:  Casual, Alert, and depressed      Current Psychosocial Factors: Caretaker responsibilities for mother who had a stroke 5 years ago, financial stress, discord with siblings, job stress, discord in relationship with fiancee,   Content of Session:   Reviewed symptoms, assisted patient identify triggers of increased symptoms of depression and anxiety, facilitated expression of thoughts and feelings, validated feelings, did SORC model of case conceptualization with patient, assisted patient identify connection between thoughts/behavior/mood using examples from her life, assisted patient identify target regarding increasing behavioral activation and improving self-care regarding physical activity, developed  plan with patient to walk 30 minutes per day after she gets off work, discussed and addressed processes and thoughts that could inhibit completion of homework,   Current Status:     depressed mood, poor motivation, continued anxiety, muscle tension, excessive worrying, decreased interest/involvement in activities, isolative behaviors  Suicidal/Homicidal:   No  Patient Progress:   Patient last was seen about 6 weeks ago. She reports having a depressive episode about 4 weeks ago. This appears to have been triggered by worries related to finances in  patient preparing children to resume school. She expressed frustration, anger, and resentment that fiancee does not contribute more financially. She expresses sadness she is not where she "should" be in life. She reports negative thoughts about self "I am less than, worthless, weak". She was on medical leave per Dr. Ivor Reining recommendation for two weeks and returned to work on 03/24/2018 per her report. She still isolates and reports little involvement in activity other than her job.  Target Goals:   1. Learn and implement calming strategies to reduce/manage overall anxiety.    2. Learn and implement behavioral strategies to overcome depression.    3.Verbalize an understanding and resolution of current interpersonal problems.    4. Identify and replace thoughts and beliefs that support depression/anxiety.  Last Reviewed:   02/28/2017  Goals Addressed Today:    1,2,3,4  Plan:      Return again in 2  weeks.  Impression/Diagnosis:  Patient presents with a history of symptoms of depression that initially began about 5 years ago after her mother had a stroke. Symptoms have waxed and waned since that time. She and one of her siblings assumed most of the caretaker responsibilities for her mother. Symptoms worsened in the past couple of months as her sibling who had helped her with mother moved to [place] and no longer is as available to assist patient with their  mother's care. Patient also has multiple other stressors. Her symptoms include low energy depressed mood, poor concentration, feelings of hopelessness, irritability, changes in appetite, sleep difficulty, feelings of worthlessness, tearfulness, anxiety, muscle tension, excessive worrying                    Diagnosis:  Axis I: Major depressive disorder, recurrent, moderate          Axis II: Deferred  Fred Franzen, LCSW 04/07/2018

## 2018-04-14 ENCOUNTER — Encounter: Payer: Self-pay | Admitting: Family Medicine

## 2018-04-17 MED FILL — TRIAMTERENE/HCTZ 37.5/25 TB: 37.5-25 | 90 days supply | Qty: 90 | Fill #1

## 2018-04-21 NOTE — Progress Notes (Signed)
BH MD/PA/NP OP Progress Note  04/24/2018 8:32 AM Mary Roman  MRN:  [id number]  Chief Complaint:  Chief Complaint    Follow-up; Depression     HPI:  Patient presents for follow-up appointment for depression.  She has had a rough few weeks; her mother became sick secondary to UTI.  She brought her mother to the appointment.  The patient and her fianc split for a week.  He offered to go to couples therapy.  Although she feels anxious, she is interested in going there. She is concerned that their relationship has been affecting their children. She goes to work every day, although she may be out for a few hours at times, mostly to bring her mother to the appointment.  She feels fatigue.  She has fair concentration.  She denies SI.  She feels anxious and tense.  She has insomnia.  She has occasional binge eating, appetite loss. She is unsure if medication is helping her.  Wt Readings from Last 3 Encounters:  04/24/18 217 lb (98.4 kg)  03/24/18 214 lb (97.1 kg)  03/06/18 215 lb (97.5 kg)    Per PMP,  Temazepam filled on 02/10/2018   Visit Diagnosis:    ICD-10-CM   1. Major depressive disorder, recurrent episode, moderate ([place]) F33.1     Past Psychiatric History: Please see initial evaluation for full details. I have reviewed the history. No updates at this time.     Past Medical History:  Past Medical History:  Diagnosis Date  . Anxiety   . Chest tightness, discomfort, or pressure 05/2010   Emergency Department   . Chronic back pain    with disc disease   . Diabetes mellitus, type II ([place])   . Headache(784.0)   . Hypertension   . Obesity   . Palpitation   . Thyromegaly    TSH-0.8 and 07/2010    Past Surgical History:  Procedure Laterality Date  . None      Family Psychiatric History: Please see initial evaluation for full details. I have reviewed the history. No updates at this time.     Family History:  Family History  Problem Relation Age of Onset  .  Diabetes Mother   . Hypertension Mother   . Stroke Mother   . Colon cancer Mother   . Cancer Mother 47       colon localized  . Alcohol abuse Mother   . Pneumonia Father   . Hypertension Sister   . Anxiety disorder Sister   . Hypertension Sister   . Leukemia Sister   . Diabetes Maternal Grandmother   . Hypertension Maternal Grandmother   . Hypertension Maternal Grandfather   . Heart attack Maternal Uncle     Social History:  Social History   Socioeconomic History  . Marital status: Single    Spouse name: Not on file  . Number of children: 2  . Years of education: Not on file  . Highest education level: Not on file  Occupational History  . 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 Financial resource strain: Not on file  . Food insecurity:    Worry: Not on file    Inability: Not on file  . Transportation needs:    Medical: Not on file    Non-medical: Not on file  Tobacco Use  . Smoking status: Never Smoker  . Smokeless tobacco: Never Used  Substance and Sexual Activity  . Alcohol use: No    Comment:  01-17-2017 per pt no  . Drug use: No    Comment: 01-17-2017 per pt no  . Sexual activity: Yes    Partners: Male    Birth control/protection: Other-see comments    Comment: bf had vasectomy  Lifestyle  . Physical activity:    Days per week: Not on file    Minutes per session: Not on file  . Stress: Not on file  Relationships  . Social connections:    Talks on phone: Not on file    Gets together: Not on file    Attends religious service: Not on file    Active member of club or organization: Not on file    Attends meetings of clubs or organizations: Not on file    Relationship status: Not on file  Other Topics Concern  . Not on file  Social History Narrative  . Not on file    Allergies: No Known Allergies  Metabolic Disorder Labs: Lab Results  Component Value Date   HGBA1C 6.8 (H) 03/06/2018   MPG 148 03/06/2018   MPG 146 08/31/2017   No  results found for: PROLACTIN Lab Results  Component Value Date   CHOL 154 03/06/2018   TRIG 50 03/06/2018   HDL 45 (L) 03/06/2018   CHOLHDL 3.4 03/06/2018   VLDL 13 01/08/2017   LDLCALC 96 03/06/2018   LDLCALC 107 (H) 01/08/2017   Lab Results  Component Value Date   TSH 1.23 03/06/2018   TSH 0.95 01/08/2017    Therapeutic Level Labs: No results found for: LITHIUM No results found for: VALPROATE No components found for:  CBMZ  Current Medications: Current Outpatient Medications  Medication Sig Dispense Refill  . buPROPion (WELLBUTRIN XL) 300 MG 24 hr tablet Take 1 tablet (300 mg total) by mouth daily. 90 tablet 0  . Cyanocobalamin (VITAMIN B-12 PO) Take by mouth.    . DULoxetine (CYMBALTA) 60 MG capsule Take 1 capsule (60 mg total) by mouth daily. 120 capsule 0  . ergocalciferol (VITAMIN D2) 50000 units capsule Take 1 capsule (50,000 Units total) by mouth once a week. One capsule once weekly 12 capsule 1  . HYDROcodone-acetaminophen (NORCO/VICODIN) 5-325 MG tablet One tablet once daily 30 tablet 0  . HYDROcodone-acetaminophen (NORCO/VICODIN) 5-325 MG tablet Take one tablet once daily for back pain 30 tablet 0  . [START ON 05/13/2018] HYDROcodone-acetaminophen (NORCO/VICODIN) 5-325 MG tablet Take one tablet once daily for back pain 30 tablet 0  . metoprolol tartrate (LOPRESSOR) 25 MG tablet Take 1 tablet (25 mg total) by mouth daily. 90 tablet 1  . ondansetron (ZOFRAN) 4 MG tablet Take 1 tablet (4 mg total) by mouth every 8 (eight) hours as needed for nausea or vomiting. 20 tablet 0  . temazepam (RESTORIL) 30 MG capsule TAKE 1 CAPSULE BY MOUTH ONCE DAILY AT BEDTIME AS NEEDED FOR SLEEP 90 capsule 1  . triamterene-hydrochlorothiazide (MAXZIDE-25) 37.5-25 MG tablet Take 1 tablet by mouth daily. 90 tablet 3  . ARIPiprazole (ABILIFY) 2 MG tablet Take 1 tablet (2 mg total) by mouth daily. 30 tablet 0  . glipiZIDE (GLUCOTROL XL) 2.5 MG 24 hr tablet Take 1 tablet (2.5 mg total) by mouth  daily with breakfast. 90 tablet 1   No current facility-administered medications for this visit.      Musculoskeletal: Strength & Muscle Tone: within normal limits Gait & Station: normal Patient leans: N/A  Psychiatric Specialty Exam: Review of Systems  Psychiatric/Behavioral: Positive for depression. Negative for hallucinations, memory loss, substance abuse and suicidal  ideas. The patient is nervous/anxious and has insomnia.   All other systems reviewed and are negative.   Blood pressure 122/74, pulse 75, height 5\' 5"  (1.651 m), weight 217 lb (98.4 kg), SpO2 98 %.Body mass index is 36.11 kg/m.  General Appearance: Fairly Groomed  Eye Contact:  Good  Speech:  Clear and Coherent  Volume:  Normal  Mood:  Depressed  Affect:  Appropriate, Congruent and down- less labile, restricted  Thought Process:  Coherent  Orientation:  Full (Time, Place, and Person)  Thought Content: Logical   Suicidal Thoughts:  No  Homicidal Thoughts:  No  Memory:  Immediate;   Good  Judgement:  Good  Insight:  Fair  Psychomotor Activity:  Normal  Concentration:  Concentration: Good and Attention Span: Good  Recall:  Good  Fund of Knowledge: Good  Language: Good  Akathisia:  No  Handed:  Right  AIMS (if indicated): not done  Assets:  Communication Skills Desire for Improvement  ADL's:  Intact  Cognition: WNL  Sleep:  Poor   Screenings: GAD-7     Office Visit from 01/08/2017 in [facility]  Total GAD-7 Score  14    PHQ2-9     Office Visit from 03/06/2018 in [facility] Office Visit from 01/23/2018 in [facility] Office Visit from 12/05/2017 in [facility] from 11/25/2017 in [facility] from 02/14/2017 in [facility]  PHQ-2 Total Score  6  4  6  6  5   PHQ-9 Total Score  24  19  22  18  16        Assessment and Plan:  Mary Roman is a 47  y.o. year old female with a history of depression, diabetes, who presents for follow up appointment for Major depressive disorder, recurrent episode, moderate ([place])  # MDD, moderate, recurrent without psychotic features She continues to endorse neurovegetative symptoms, although there has been slight improvement since the last appointment.  Will add Abilify as adjunctive treatment for depression.  Discussed potential metabolic side effect.  Will continue duloxetine to target neurovegetative symptoms.  Will continue Wellbutrin as adjunctive treatment for depression.  She has no known history of seizure.  Discussed risk of worsening headache and anxiety.   Plan I have reviewed and updated plans as below 1. Continue duloxetine 120 mg daily  2.ContinueWellbutrin 300mg  daily  3. Start Abilify 2 mg daily  4.Return to clinic in one month for 15 mins 5. (Continue temazepam 30 mg at night as needed for sleep) - She will continue to see Ms Bynyum for therapy (She is on gabapentin for pain and hydroxyzine)  I have reviewed suicide assessment in detail. No change in the following assessment. The patient demonstrates the following risk factors for suicide: Chronic risk factors for suicide include: psychiatric disorder of depression, anxietyand chronic pain. Acute risk factorsfor suicide include: N/A. Protective factorsfor this patient include: positive social support, responsibility to others (children, family), coping skills and hope for the future. Considering these factors, the overall suicide risk at this point appears to be low. Patient isappropriate for outpatient follow up.   [name], MD 04/24/2018, 8:32 AM

## 2018-04-22 ENCOUNTER — Other Ambulatory Visit: Payer: Self-pay

## 2018-04-22 ENCOUNTER — Telehealth: Payer: Self-pay | Admitting: Family Medicine

## 2018-04-22 MED ORDER — METOPROLOL TARTRATE 25 MG PO TABS: 25.0000 mg | ORAL_TABLET | Freq: Every day | ORAL | 1 refills | Status: DC

## 2018-04-22 MED FILL — METOPROLOL TARTRATE 25 MG T: 25 | 90 days supply | Qty: 90 | Fill #0

## 2018-04-22 NOTE — Telephone Encounter (Signed)
Medication refilled and sent to [facility].

## 2018-04-22 NOTE — Telephone Encounter (Signed)
Pt needs [facility]oprolol called into [facility] ---

## 2018-04-24 ENCOUNTER — Encounter (HOSPITAL_COMMUNITY): Payer: Self-pay | Admitting: Psychiatry

## 2018-04-24 ENCOUNTER — Ambulatory Visit (INDEPENDENT_AMBULATORY_CARE_PROVIDER_SITE_OTHER): Payer: No Typology Code available for payment source | Admitting: Psychiatry

## 2018-04-24 VITALS — BP 122/74 | HR 75 | Ht 65.0 in | Wt 217.0 lb

## 2018-04-24 DIAGNOSIS — F331 Major depressive disorder, recurrent, moderate: Secondary | ICD-10-CM | POA: Diagnosis not present

## 2018-04-24 MED ORDER — ARIPIPRAZOLE 2 MG PO TABS: 2.0000 mg | ORAL_TABLET | Freq: Every day | ORAL | 0 refills | Status: DC

## 2018-04-24 NOTE — Patient Instructions (Signed)
1. Continue duloxetine 120 mg daily  2.ContinueWellbutrin 300mg  daily  3. Start Abilify 2 mg daily  4.Return to clinic in one month for 15 mins

## 2018-04-28 ENCOUNTER — Ambulatory Visit (INDEPENDENT_AMBULATORY_CARE_PROVIDER_SITE_OTHER): Payer: No Typology Code available for payment source | Admitting: Psychiatry

## 2018-04-28 DIAGNOSIS — F331 Major depressive disorder, recurrent, moderate: Secondary | ICD-10-CM | POA: Diagnosis not present

## 2018-04-28 NOTE — Progress Notes (Signed)
Patient:  Mary Roman   DOB: 11-18-1970  MR Number: [id number]    Location: [place]:  [address] [place],  [place], [address]                         Start: Monday 04/28/2018 8:11 AM  End: Monday 04/28/2018 9:10 AM           Provider/Observer:     Maurice Small, MSW, LCSW   Chief Complaint:           Depression/anxiety              Reason For Service:     [name] is a 47 y.o. female who is referred for services by psychiatrist Dr. Modesta Messing. Patient reports trying to cope with anxiety, depression, and everyday stress. She states having problems with finances and occasionally work. She says most of her stress is related to not having enough time in the day to do what she needs to do. 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 Patient has 2 children  and a fiance. She is the sole breadwinner for her home. She states having difficulty getting out of bed in the mornings, having to really push herself, having no energy, poor motivation, diminished interest in activities, and sleeping excessively.      Interventions Strategy:  Supportive/CBT  Participation Level:   Active  Participation Quality:  Appropriate      Behavioral Observation:  Casual, Alert, and depressed      Current Psychosocial Factors: Caretaker responsibilities for mother who had a stroke 5 years ago, financial stress, discord with siblings, job stress, discord in relationship with fiancee,   Content of Session:   Reviewed symptoms, assisted patient identify triggers of increased symptoms of depression and anxiety, facilitated expression of thoughts and feelings, validated feelings,discussed the role of self-care in managing stress. assisted patient identify and address thoughts and processes that inhibited implementation of walking plan, assigned patient to implement plan developed in session, assisted patient develop plan to improve eating patterns (avoid skipping  meals, meal prep for the week on Sundays, reduce caffeine use to one can of soda per day, replace nuts for sweets, double current water intake), discussed and addressed thoughts and processes that may inhibit implementation of plan,   Current Status:     depressed mood, poor motivation, continued anxiety, muscle tension, excessive worrying, decreased interest/involvement in activities,   Suicidal/Homicidal:   No  Patient Progress:   Patient last was seen about 6 weeks ago. She reports continued stress and anxiety since last session. She reports being depressed but says she isn't staying in bed but is feeling restless. She has continued to have negative thoughts about self and past choices. She has started working through lunch again and reports she did not implement walking plan. She reports poor eating patterns and skipping meals. She reports increased carb consumption and gaining 4 pounds. She reports conflict with fiancee resulting in couple separating for a week. He is back home now but patient expresses ambivalent feelings about him being there. Per her report, she says he wants to go to couple's therapy and patient expresses interest in doing this. She has told him he will have to be the one to work this out. She reports additional stress regarding increased caretaker responsibilities for mother as she had UTI. She expresses frustration she and her sibling don't have much support  from other siblings in providing care for mother.   Target Goals:   1. Learn and implement calming strategies to reduce/manage overall anxiety.    2. Learn and implement behavioral strategies to overcome depression.    3.Verbalize an understanding and resolution of current interpersonal problems.    4. Identify and replace thoughts and beliefs that support depression/anxiety.  Last Reviewed:   02/28/2017  Goals Addressed Today:    1,2,3,4  Plan:      Return again in 2  weeks.  Impression/Diagnosis:  Patient presents  with a history of symptoms of depression that initially began about 5 years ago after her mother had a stroke. Symptoms have waxed and waned since that time. She and one of her siblings assumed most of the caretaker responsibilities for her mother. Symptoms worsened in the past couple of months as her sibling who had helped her with mother moved to [place] and no longer is as available to assist patient with their mother's care. Patient also has multiple other stressors. Her symptoms include low energy depressed mood, poor concentration, feelings of hopelessness, irritability, changes in appetite, sleep difficulty, feelings of worthlessness, tearfulness, anxiety, muscle tension, excessive worrying                    Diagnosis:  Axis I: Major depressive disorder, recurrent, moderate          Axis II: Deferred  Maurita Havener, LCSW 04/28/2018

## 2018-04-30 ENCOUNTER — Encounter: Payer: Self-pay | Admitting: Cardiology

## 2018-04-30 NOTE — Progress Notes (Deleted)
Cardiology Office Note  Date: 04/30/2018   ID: Mary Roman, DOB [birth year], MRN [id number]  PCP: Fayrene Helper, MD  Consulting Cardiologist: Rozann Lesches, MD   No chief complaint on file.   History of Present Illness: Mary Roman is a 47 y.o. female referred for cardiology consultation by Dr. Moshe Cipro for evaluation of palpitations.  Record review finds previous cardiac evaluation by Dr. Lattie Haw, last seen in 2013.  She underwent an exercise echocardiogram in 2012 that was negative for ischemia, also reported previous palpitations and was treated with metoprolol at that time.  Past Medical History:  Diagnosis Date  . Anxiety   . Chronic back pain   . Diabetes mellitus, type II ([place])   . Headache(784.0)   . Hypertension   . Obesity   . Palpitation   . Thyromegaly     Past Surgical History:  Procedure Laterality Date  . None      Current Outpatient Medications  Medication Sig Dispense Refill  . ARIPiprazole (ABILIFY) 2 MG tablet Take 1 tablet (2 mg total) by mouth daily. 30 tablet 0  . buPROPion (WELLBUTRIN XL) 300 MG 24 hr tablet Take 1 tablet (300 mg total) by mouth daily. 90 tablet 0  . Cyanocobalamin (VITAMIN B-12 PO) Take by mouth.    . DULoxetine (CYMBALTA) 60 MG capsule Take 1 capsule (60 mg total) by mouth daily. 120 capsule 0  . ergocalciferol (VITAMIN D2) 50000 units capsule Take 1 capsule (50,000 Units total) by mouth once a week. One capsule once weekly 12 capsule 1  . glipiZIDE (GLUCOTROL XL) 2.5 MG 24 hr tablet Take 1 tablet (2.5 mg total) by mouth daily with breakfast. 90 tablet 1  . HYDROcodone-acetaminophen (NORCO/VICODIN) 5-325 MG tablet One tablet once daily 30 tablet 0  . HYDROcodone-acetaminophen (NORCO/VICODIN) 5-325 MG tablet Take one tablet once daily for back pain 30 tablet 0  . [START ON 05/13/2018] HYDROcodone-acetaminophen (NORCO/VICODIN) 5-325 MG tablet Take one tablet once daily for back pain 30 tablet 0  .  metoprolol tartrate (LOPRESSOR) 25 MG tablet Take 1 tablet (25 mg total) by mouth daily. 90 tablet 1  . ondansetron (ZOFRAN) 4 MG tablet Take 1 tablet (4 mg total) by mouth every 8 (eight) hours as needed for nausea or vomiting. (Patient not taking: Reported on 04/28/2018) 20 tablet 0  . temazepam (RESTORIL) 30 MG capsule TAKE 1 CAPSULE BY MOUTH ONCE DAILY AT BEDTIME AS NEEDED FOR SLEEP 90 capsule 1  . triamterene-hydrochlorothiazide (MAXZIDE-25) 37.5-25 MG tablet Take 1 tablet by mouth daily. 90 tablet 3   No current facility-administered medications for this visit.    Allergies:  Patient has no known allergies.   Social History: The patient  reports that she has never smoked. She has never used smokeless tobacco. She reports that she does not drink alcohol or use drugs.   Family History: The patient's family history includes Alcohol abuse in her mother; Anxiety disorder in her sister; Cancer (age of onset: 38) in her mother; Colon cancer in her mother; Diabetes in her maternal grandmother and mother; Heart attack in her maternal uncle; Hypertension in her maternal grandfather, maternal grandmother, mother, sister, and sister; Leukemia in her sister; Pneumonia in her father; Stroke in her mother.   ROS:  Please see the history of present illness. Otherwise, complete review of systems is positive for {NONE DEFAULTED:18576::"none"}.  All other systems are reviewed and negative.   Physical Exam: VS:  There were no vitals taken for this visit.,  BMI There is no height or weight on file to calculate BMI.  Wt Readings from Last 3 Encounters:  03/06/18 215 lb (97.5 kg)  01/23/18 212 lb (96.2 kg)  12/05/17 210 lb (95.3 kg)    General: Patient appears comfortable at rest. HEENT: Conjunctiva and lids normal, oropharynx clear with moist mucosa. Neck: Supple, no elevated JVP or carotid bruits, no thyromegaly. Lungs: Clear to auscultation, nonlabored breathing at rest. Cardiac: Regular rate and rhythm,  no S3 or significant systolic murmur, no pericardial rub. Abdomen: Soft, nontender, no hepatomegaly, bowel sounds present, no guarding or rebound. Extremities: No pitting edema, distal pulses 2+. Skin: Warm and dry. Musculoskeletal: No kyphosis. Neuropsychiatric: Alert and oriented x3, affect grossly appropriate.  ECG: I personally reviewed the tracing from 08/04/2010 which showed sinus rhythm with Q wave in lead III, possible right atrial enlargement.  Recent Labwork: 03/06/2018: ALT 10; AST 12; BUN 14; Creat 0.95; Hemoglobin 10.9; Platelets 432; Potassium 3.6; Sodium 140; TSH 1.23     Component Value Date/Time   CHOL 154 03/06/2018 0928   TRIG 50 03/06/2018 0928   HDL 45 (L) 03/06/2018 0928   CHOLHDL 3.4 03/06/2018 0928   VLDL 13 01/08/2017 0924   LDLCALC 96 03/06/2018 0928    Other Studies Reviewed Today:  Exercise echocardiogram 08/15/2010: Study Conclusions  - Stress ECG conclusions: There were no stress arrhythmias or conduction abnormalities. The stress ECG was negative for ischemia. - Staged echo: There was no echocardiographic evidence for stress-induced ischemia.  Assessment and Plan:   Current medicines were reviewed with the patient today.  No orders of the defined types were placed in this encounter.   Disposition:  Signed, Satira Sark, MD, [facility] 04/30/2018 8:43 AM    [facility]. [address] [place], [place] [address] Phone: [phone]; Fax: [phone]

## 2018-05-01 ENCOUNTER — Ambulatory Visit: Payer: Self-pay | Admitting: Cardiology

## 2018-05-14 MED FILL — TEMAZEPAM 30 MG CAPSULE: 30 | 90 days supply | Qty: 90 | Fill #1

## 2018-05-14 MED FILL — ARIPiprazole 2 MG TABS: 2 | 30 days supply | Qty: 30 | Fill #0

## 2018-05-15 ENCOUNTER — Encounter: Payer: Self-pay | Admitting: Family Medicine

## 2018-05-15 ENCOUNTER — Ambulatory Visit (INDEPENDENT_AMBULATORY_CARE_PROVIDER_SITE_OTHER): Payer: No Typology Code available for payment source | Admitting: Family Medicine

## 2018-05-15 ENCOUNTER — Ambulatory Visit (INDEPENDENT_AMBULATORY_CARE_PROVIDER_SITE_OTHER): Payer: No Typology Code available for payment source | Admitting: Psychiatry

## 2018-05-15 ENCOUNTER — Encounter (HOSPITAL_COMMUNITY): Payer: Self-pay | Admitting: Psychiatry

## 2018-05-15 VITALS — BP 124/86 | HR 93 | Resp 12 | Ht 60.0 in | Wt 221.1 lb

## 2018-05-15 DIAGNOSIS — E1169 Type 2 diabetes mellitus with other specified complication: Secondary | ICD-10-CM | POA: Diagnosis not present

## 2018-05-15 DIAGNOSIS — F331 Major depressive disorder, recurrent, moderate: Secondary | ICD-10-CM

## 2018-05-15 DIAGNOSIS — I10 Essential (primary) hypertension: Secondary | ICD-10-CM | POA: Diagnosis not present

## 2018-05-15 DIAGNOSIS — Z809 Family history of malignant neoplasm, unspecified: Secondary | ICD-10-CM

## 2018-05-15 DIAGNOSIS — D509 Iron deficiency anemia, unspecified: Secondary | ICD-10-CM

## 2018-05-15 DIAGNOSIS — Z23 Encounter for immunization: Secondary | ICD-10-CM

## 2018-05-15 DIAGNOSIS — E669 Obesity, unspecified: Secondary | ICD-10-CM

## 2018-05-15 DIAGNOSIS — R52 Pain, unspecified: Secondary | ICD-10-CM

## 2018-05-15 DIAGNOSIS — Z808 Family history of malignant neoplasm of other organs or systems: Secondary | ICD-10-CM

## 2018-05-15 MED ORDER — HYDROCODONE-ACETAMINOPHEN 5-325 MG PO TABS: ORAL_TABLET | ORAL | 0 refills | Status: DC

## 2018-05-15 NOTE — Patient Instructions (Addendum)
F/U week of Jan 6 , cal;ll if you need me before  Mindful eating and daily circuit of parking lot  Flu vaccine today and note for your job weith it  Non  Fasting hBA1C, chem 7 and EGFR, 1 week before next visit  We will obtain your eye exam report from [facility] at, "[facility]" and record  Mammogram due pls sched  Dr Laural Golden to eval IDA, mother   Dx with colon ca age 47  Thank you  for choosing [facility]. We consider it a privelige to serve you.  Delivering excellent health care in a caring and  compassionate way is our goal.  Partnering with you,  so that together we can achieve this goal is our strategy.

## 2018-05-15 NOTE — Progress Notes (Signed)
Fill Date ID Written Drug Qty Days Prescriber Rx # Pharmacy Refill Daily Dose * Pymt Type PMP  05/14/2018  2  02/07/2018  Temazepam 30 Mg Capsule  90.00 90 Ma Sim  [id number]  Mos ([id number])  1/1 1.50 LME Comm Ins  [place]  04/13/2018  1  03/06/2018  Hydrocodone-Acetamin 5-325 Mg  30.00 30 Ma Sim  [id number]  Wal ([id number])  0/0 5.00 MME Comm Ins  [place]  03/13/2018  1  03/06/2018  Hydrocodone-Acetamin 5-325 Mg  30.00 30 Ma Sim  [id number]  Wal ([id number])  0/0 5.00 MME Comm Ins  [place]  02/10/2018  1  12/05/2017  Hydrocodone-Acetamin 5-325 Mg  30.00 30 Ma Sim  [id number]  Wal ([id number])  0/0 5.00 MME Comm Ins  [place]  02/10/2018  2  02/07/2018  Temazepam 30 Mg Capsule  90.00 90 Ma Sim  [id number]  Mos (0906)  0/1 1.50 LME Comm Ins  [place]  01/10/2018  1  12/05/2017  Hydrocodone-Acetamin 5-325 Mg  30.00 30 Ma Sim  [id number]  Wal ([id number])  0/0 5.00 MME Comm Ins  [place]  12/12/2017  2  09/02/2017  Phentermine 37.5 Mg Tablet  30.00 30 Ma Sim  [id number]  Mos ([id number])  2/2  Other  [place]  12/10/2017  1  12/05/2017  Hydrocodone-Acetamin 5-325 Mg  30.00 30 Ma Sim  [id number]  Wal ([id number])  0/0 5.00 MME Comm Ins  [place]  12/05/2017  2  12/05/2017  Phentermine 37.5 Mg Tablet  30.00 30 Ma Sim  [id number]  Mos (0906)  0/3  Other  [place]  11/10/2017  1  09/02/2017  Hydrocodone-Acetamin 5-325 Mg  30.00 30 Ma Sim  [id number]  Wal ([id number])  0/0 5.00 MME Comm Ins  [place]  11/04/2017  2  09/02/2017  Phentermine 37.5 Mg Tablet  30.00 30 Ma Sim  [id number]  Mos (0906)  1/2  Other  [place]  10/17/2017  2  06/27/2017  Temazepam 30 Mg Capsule  90.00 90 Ma Sim  [id number]  Mos (0906)  1/1 1.50 LME Comm Ins  [place]  10/10/2017  1  09/02/2017  Hydrocodone-Acetamin 5-325 Mg  30.00 30 Ma Sim  [id number]  Wal ([id number])  0/0 5.00 MME Comm Ins  [place]  09/20/2017  2  09/02/2017  Phentermine 37.5 Mg Tablet  30.00 30 Ma Sim  [id number]  Mos (8676)  0/2  Other  [place]  09/12/2017  1  09/02/2017  Hydrocodone-Acetamin 5-325 Mg  30.00 30 Ma Sim  [id number]  Wal ([id number])  0/0 5.00 MME Comm Ins  [place]  08/19/2017  2  06/27/2017  Phentermine 37.5 Mg Tablet  30.00 30 Ma Sim   [id number]  Mos (0906)  1/1  Other  [place]  08/13/2017  1  06/06/2017  Hydrocodone-Acetamin 5-325 Mg  30.00 30 Ma Sim  [id number]  Wal ([id number])  0/0 5.00 MME Comm Ins  [place]  07/12/2017  1  06/06/2017  Hydrocodone-Acetamin 5-325 Mg  30.00 30 Ma Sim  [id number]  Wal ([id number])  0/0 5.00 MME Comm Ins  [place]  06/27/2017  2  06/27/2017  Temazepam 30 Mg Capsule  90.00 90 Ma Sim  [id number]  Mos (0906)  0/1 1.50 LME Comm Ins  [place]  06/27/2017  2  06/27/2017  Phentermine 37.5 Mg Tablet  30.00 [place]  [id number]  Mos ([id number])  0/1

## 2018-05-15 NOTE — Progress Notes (Signed)
Patient:  Mary Roman   DOB: 08/11/1970  MR Number: [id number]    Location: [facility]:  [address] [place],  [place], [address]                         Start:     Thursday 05/15/2018 10:10 AM End: Thursday 05/15/2018 11:05 AM        Provider/Observer:     Maurice Small, MSW, LCSW   Chief Complaint:           Depression/anxiety              Reason For Service:     Mary Roman is a 47 y.o. female who is referred for services by psychiatrist Dr. Modesta Messing. Patient reports trying to cope with anxiety, depression, and everyday stress. She states having problems with finances and occasionally work. She says most of her stress is related to not having enough time in the day to do what she needs to do. 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 Patient has 2 children  and a fiance. She is the sole breadwinner for her home. She states having difficulty getting out of bed in the mornings, having to really push herself, having no energy, poor motivation, diminished interest in activities, and sleeping excessively.      Interventions Strategy:  Supportive/CBT  Participation Level:   Active  Participation Quality:  Appropriate      Behavioral Observation:  Casual, Alert, and depressed      Current Psychosocial Factors: Caretaker responsibilities for mother who had a stroke 5 years ago, financial stress, discord with siblings, job stress, discord in relationship with fiancee,   Content of Session:   Reviewed symptoms, administered GAD-7, reviewed/developed treatment plan, reoriented patient to CBT, assisted patient identify thoughts and processes that interfered with completion of homework, assisted patient develop plan to implement walking plan ( walk for 30 minutes on her lunch on M, W, and Th), assisted patient identify and address and potential thoughts or other barriers that would interfere with completion of homework, assisted patient  identify benefits of walking and consequences of not walking, wrote on index card, assigned patient to read card daily, assigned patient to practice deep breathing 5 minutes 2 x per day    Current Status:     depressed mood, poor motivation, continued anxiety, muscle tension, excessive worrying, decreased interest/involvement in activities,   Suicidal/Homicidal:   No  Patient Progress:   Patient last was seen about 3 weeks ago. She reports continued stress and anxiety since last session but being less depressed than she has been. She reports she did implement walking plan for a few days as a coworker walked with her but patient stopped when co-worker no longer was available. She also reports working through lunch and sometimes just staying at her desk worrying. She also reports inconsistent efforts regarding eating patterns.   Target Goals:   1. Learn and implement calming strategies to reduce/manage overall anxiety.    2. Learn and implement behavioral strategies to overcome depression.     Last Reviewed:   05/15/2018  Goals Addressed Today:    1,2,3,4  Plan:      Return again in 2  weeks.  Impression/Diagnosis:  Patient presents with a history of symptoms of depression that initially began about 5 years ago after her mother had a stroke. Symptoms have waxed and waned since that  time. She and one of her siblings assumed most of the caretaker responsibilities for her mother. Symptoms worsened in the past couple of months as her sibling who had helped her with mother moved to [place] and no longer is as available to assist patient with their mother's care. Patient also has multiple other stressors. Her symptoms include low energy depressed mood, poor concentration, feelings of hopelessness, irritability, changes in appetite, sleep difficulty, feelings of worthlessness, tearfulness, anxiety, muscle tension, excessive worrying                    Diagnosis:  Axis I: Major depressive disorder,  recurrent, moderate          Axis II: Deferred  Lunabella Badgett, LCSW 05/15/2018

## 2018-05-19 ENCOUNTER — Telehealth (HOSPITAL_COMMUNITY): Payer: Self-pay | Admitting: *Deleted

## 2018-05-19 ENCOUNTER — Other Ambulatory Visit (HOSPITAL_COMMUNITY): Payer: Self-pay | Admitting: Psychiatry

## 2018-05-19 MED ORDER — DULOXETINE HCL 60 MG PO CPEP: 120.0000 mg | ORAL_CAPSULE | Freq: Every day | ORAL | 0 refills | Status: DC

## 2018-05-19 NOTE — Telephone Encounter (Signed)
Dr Modesta Messing Patient stopped by because she renumbered that the Cymbalta was increased on the  9/26 office visit: Continueduloxetine 120 mg daily But the [facility] only has e-script for :  DULoxetine (CYMBALTA) 60 MG capsule 120 capsule  Route: Take 1 capsule (60 mg total) by mouth daily.  AND she has been taking 2 daily as discussed during office visit. Please send new e-script to RX

## 2018-05-19 NOTE — Telephone Encounter (Signed)
Ordered duloxetine 120 mg daily for 30 days.

## 2018-05-22 NOTE — Progress Notes (Signed)
[facility] MD/PA/NP OP Progress Note  05/26/2018 8:50 AM Mary Roman  MRN:  [id number]  Chief Complaint:  Chief Complaint    Follow-up; Depression     HPI:  Patient presents for follow-up appointment for depression.  She states that she has not started Abilify until 2 weeks ago.  She notices that she has more vivid dreams after starting Abilify.  Although she does not feel bothered so much, she sometimes feels it is very well.  She prefers to stay on the current medication for now.  She believes that the relationship with her fianc is getting better; he has started a job.  They have been trying to work on the relationship.  She states that she was very low a few weeks ago especially due to her weight gain.  She feels pleased that she lost some weight today.  She has more energy and motivation.  She has started to do exercise by taking a walk regularly.  She eats healthier food.  She has insomnia.  She has fair concentration.  She denies SI.  She feels anxious and tense at times.  She denies panic attacks.  She finds therapy to be very helpful.   Wt Readings from Last 3 Encounters:  05/26/18 217 lb (98.4 kg)  05/15/18 221 lb 1.9 oz (100.3 kg)  04/24/18 217 lb (98.4 kg)   Visit Diagnosis:    ICD-10-CM   1. Major depressive disorder, recurrent episode, moderate ([facility]) F33.1     Past Psychiatric History: Please see initial evaluation for full details. I have reviewed the history. No updates at this time.     Past Medical History:  Past Medical History:  Diagnosis Date  . Anxiety   . Chronic back pain   . Diabetes mellitus, type II ([facility])   . Headache(784.0)   . Hypertension   . Obesity   . Palpitation   . Thyromegaly     Past Surgical History:  Procedure Laterality Date  . None      Family Psychiatric History: Please see initial evaluation for full details. I have reviewed the history. No updates at this time.     Family History:  Family History  Problem Relation Age of  Onset  . Diabetes Mother   . Hypertension Mother   . Stroke Mother   . Colon cancer Mother   . Cancer Mother 85       colon localized  . Alcohol abuse Mother   . Pneumonia Father   . Hypertension Sister   . Anxiety disorder Sister   . Hypertension Sister   . Leukemia Sister   . Diabetes Maternal Grandmother   . Hypertension Maternal Grandmother   . Hypertension Maternal Grandfather   . Heart attack Maternal Uncle     Social History:  Social History   Socioeconomic History  . Marital status: Single    Spouse name: Not on file  . Number of children: 2  . Years of education: Not on file  . Highest education level: Not on file  Occupational History  . 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 Financial resource strain: Not on file  . Food insecurity:    Worry: Not on file    Inability: Not on file  . Transportation needs:    Medical: Not on file    Non-medical: Not on file  Tobacco Use  . Smoking status: Never Smoker  . Smokeless tobacco: Never Used  Substance and Sexual Activity  .  Alcohol use: No    Comment: 01-17-2017 per pt no  . Drug use: No    Comment: 01-17-2017 per pt no  . Sexual activity: Yes    Partners: Male    Birth control/protection: Other-see comments    Comment: bf had vasectomy  Lifestyle  . Physical activity:    Days per week: Not on file    Minutes per session: Not on file  . Stress: Not on file  Relationships  . Social connections:    Talks on phone: Not on file    Gets together: Not on file    Attends religious service: Not on file    Active member of club or organization: Not on file    Attends meetings of clubs or organizations: Not on file    Relationship status: Not on file  Other Topics Concern  . Not on file  Social History Narrative  . Not on file    Allergies: No Known Allergies  Metabolic Disorder Labs: Lab Results  Component Value Date   HGBA1C 6.8 (H) 03/06/2018   MPG 148 03/06/2018   MPG 146  08/31/2017   No results found for: PROLACTIN Lab Results  Component Value Date   CHOL 154 03/06/2018   TRIG 50 03/06/2018   HDL 45 (L) 03/06/2018   CHOLHDL 3.4 03/06/2018   VLDL 13 01/08/2017   LDLCALC 96 03/06/2018   LDLCALC 107 (H) 01/08/2017   Lab Results  Component Value Date   TSH 1.23 03/06/2018   TSH 0.95 01/08/2017    Therapeutic Level Labs: No results found for: LITHIUM No results found for: VALPROATE No components found for:  CBMZ  Current Medications: Current Outpatient Medications  Medication Sig Dispense Refill  . ARIPiprazole (ABILIFY) 2 MG tablet Take 1 tablet (2 mg total) by mouth daily. 30 tablet 0  . buPROPion (WELLBUTRIN XL) 300 MG 24 hr tablet Take 1 tablet (300 mg total) by mouth daily. 90 tablet 0  . Cyanocobalamin (VITAMIN B-12 PO) Take by mouth.    . DULoxetine (CYMBALTA) 60 MG capsule Take 2 capsules (120 mg total) by mouth daily. 60 capsule 0  . ergocalciferol (VITAMIN D2) 50000 units capsule Take 1 capsule (50,000 Units total) by mouth once a week. One capsule once weekly 12 capsule 1  . HYDROcodone-acetaminophen (NORCO/VICODIN) 5-325 MG tablet Take one tablet once daily for back pain 30 tablet 0  . HYDROcodone-acetaminophen (NORCO/VICODIN) 5-325 MG tablet One tablet at bedtime for pain 30 tablet 0  . [START ON 06/14/2018] HYDROcodone-acetaminophen (NORCO/VICODIN) 5-325 MG tablet One tablet at bedtime for back pain 30 tablet 0  . [START ON 07/14/2018] HYDROcodone-acetaminophen (NORCO/VICODIN) 5-325 MG tablet Take one tablet at bedtime for back pain 30 tablet 0  . metoprolol tartrate (LOPRESSOR) 25 MG tablet Take 1 tablet (25 mg total) by mouth daily. 90 tablet 1  . temazepam (RESTORIL) 30 MG capsule TAKE 1 CAPSULE BY MOUTH ONCE DAILY AT BEDTIME AS NEEDED FOR SLEEP 90 capsule 1  . triamterene-hydrochlorothiazide (MAXZIDE-25) 37.5-25 MG tablet Take 1 tablet by mouth daily. 90 tablet 3  . glipiZIDE (GLUCOTROL XL) 2.5 MG 24 hr tablet Take 1 tablet (2.5 mg  total) by mouth daily with breakfast. 90 tablet 1   No current facility-administered medications for this visit.      Musculoskeletal: Strength & Muscle Tone: within normal limits Gait & Station: normal Patient leans: N/A  Psychiatric Specialty Exam: Review of Systems  Psychiatric/Behavioral: Positive for depression. Negative for hallucinations, memory loss, substance abuse and  suicidal ideas. The patient has insomnia. The patient is not nervous/anxious.   All other systems reviewed and are negative.   Blood pressure (!) 142/90, pulse (!) 109, height 5\' 5"  (1.651 m), weight 217 lb (98.4 kg), SpO2 97 %.Body mass index is 36.11 kg/m.  General Appearance: Fairly Groomed  Eye Contact:  Good  Speech:  Clear and Coherent  Volume:  Normal  Mood:  Depressed  Affect:  Appropriate, Congruent and down  Thought Process:  Coherent  Orientation:  Full (Time, Place, and Person)  Thought Content: Logical   Suicidal Thoughts:  No  Homicidal Thoughts:  No  Memory:  Immediate;   Good  Judgement:  Good  Insight:  Fair  Psychomotor Activity:  Normal  Concentration:  Concentration: Good and Attention Span: Good  Recall:  Good  Fund of Knowledge: Good  Language: Good  Akathisia:  No  Handed:  Right  AIMS (if indicated): not done  Assets:  Communication Skills Desire for Improvement  ADL's:  Intact  Cognition: WNL  Sleep:  Poor   Screenings: GAD-7     Counselor from 05/15/2018 in [facility] Office Visit from 01/08/2017 in [facility]  Total GAD-7 Score  17  14    PHQ2-9     Office Visit from 05/15/2018 in [facility] Office Visit from 03/06/2018 in [facility] Office Visit from 01/23/2018 in [facility] Office Visit from 12/05/2017 in [facility] from 11/25/2017 in [facility]  PHQ-2 Total Score  2  6  4  6  6   PHQ-9 Total Score  10  24   19  22  18        Assessment and Plan:  Mary Roman is a 47 y.o. year old female with a history of depression, diabetes, who presents for follow up appointment for Major depressive disorder, recurrent episode, moderate ([facility])  # MDD, moderate, recurrent without psychotic features There has been slight improvement in depressive symptoms after up titration of Abilify.  Will continue current dose of Abilify at this time as adjunctive treatment for depression.  Will monitor vivid dreams she started to have after starting this medication.  Discussed potential metabolic side effect.  Will continue duloxetine to target depression.  Will continue Wellbutrin as adjunctive treatment for depression.  She has no known history of seizure.  Discussed behavioral activation and self compassion.   Plan I have reviewed and updated plans as below 1.Continueduloxetine 120 mg daily  2.ContinueWellbutrin 300mg  daily  3. Continue Abilify 2 mg daily  4.Return to clinic in one month for15 mins 5. (Continue temazepam 30 mg at night as needed for sleep) - She will continue to see Ms Bynyum for therapy (She is on gabapentin for pain and hydroxyzine)   The patient demonstrates the following risk factors for suicide: Chronic risk factors for suicide include: psychiatric disorder of depression, anxietyand chronic pain. Acute risk factorsfor suicide include: N/A. Protective factorsfor this patient include: positive social support, responsibility to others (children, family), coping skills and hope for the future. Considering these factors, the overall suicide risk at this point appears to be low. Patient isappropriate for outpatient follow up.   [name], MD 05/26/2018, 8:50 AM

## 2018-05-26 ENCOUNTER — Encounter: Payer: Self-pay | Admitting: Family Medicine

## 2018-05-26 ENCOUNTER — Ambulatory Visit (INDEPENDENT_AMBULATORY_CARE_PROVIDER_SITE_OTHER): Payer: No Typology Code available for payment source | Admitting: Psychiatry

## 2018-05-26 ENCOUNTER — Encounter (HOSPITAL_COMMUNITY): Payer: Self-pay | Admitting: Psychiatry

## 2018-05-26 VITALS — BP 142/90 | HR 109 | Ht 65.0 in | Wt 217.0 lb

## 2018-05-26 DIAGNOSIS — G4700 Insomnia, unspecified: Secondary | ICD-10-CM | POA: Diagnosis not present

## 2018-05-26 DIAGNOSIS — F331 Major depressive disorder, recurrent, moderate: Secondary | ICD-10-CM | POA: Diagnosis not present

## 2018-05-26 MED ORDER — DULOXETINE HCL 60 MG PO CPEP: 120.0000 mg | ORAL_CAPSULE | Freq: Every day | ORAL | 0 refills | Status: DC

## 2018-05-26 MED ORDER — ARIPIPRAZOLE 2 MG PO TABS: 2.0000 mg | ORAL_TABLET | Freq: Every day | ORAL | 0 refills | Status: DC

## 2018-05-26 NOTE — Assessment & Plan Note (Signed)
Slightly improved, treated by Psychiatry

## 2018-05-26 NOTE — Progress Notes (Signed)
TIFFONY KITE     MRN: [id number]      DOB: 1971/04/21   HPI [name] is here for follow up and re-evaluation of chronic medical conditions, medication management and review of any available recent lab and radiology data.  Preventive health is updated, specifically  Cancer screening and Immunization.   Questions or concerns regarding consultations or procedures which the PT has had in the interim are  addressed. The PT denies any adverse reactions to current medications since the last visit.  There are no new concerns.  There are no specific complaints   ROS Denies recent fever or chills. Denies sinus pressure, nasal congestion, ear pain or sore throat. Denies chest congestion, productive cough or wheezing. Denies chest pains, palpitations and leg swelling Denies abdominal pain, nausea, vomiting,diarrhea or constipation.   Denies dysuria, frequency, hesitancy or incontinence. Denies joint pain, swelling and limitation in mobility. Denies headaches, seizures, numbness, or tingling. C/o depression, anxiety and  Insomnia.Not suicidal or homicdal, improved slightly , treated by Psych Denies skin break down or rash.   PE  BP 124/86 (BP Location: Right Arm, Patient Position: Sitting, Cuff Size: Large)   Pulse 93   Resp 12   Ht 5' (1.524 m)   Wt 221 lb 1.9 oz (100.3 kg)   SpO2 98% Comment: room air  BMI 43.18 kg/m   Patient alert and oriented and in no cardiopulmonary distress.  HEENT: No facial asymmetry, EOMI,   oropharynx pink and moist.  Neck supple no JVD, no mass.  Chest: Clear to auscultation bilaterally.  CVS: S1, S2 no murmurs, no S3.Regular rate.  ABD: Soft non tender.   Ext: No edema  MS: Adequate ROM spine, shoulders, hips and knees.  Skin: Intact, no ulcerations or rash noted.  Psych: Good eye contact, normal affect. Memory intact not anxious or depressed appearing.  CNS: CN 2-12 intact, power,  normal throughout.no focal deficits  noted.   Assessment & Plan  Encounter for pain management The patient's Controlled Substance registry is reviewed and compliance confirmed. Adequacy of  Pain control and level of function is assessed. Medication dosing is adjusted as deemed appropriate. Twelve weeks of medication is prescribed , patient signs for the script and is provided with a follow up appointment between 11 to 12 weeks .   Diabetes mellitus type 2 in obese [facility]) Ms. Doescher is reminded of the importance of commitment to daily physical activity for 30 minutes or more, as able and the need to limit carbohydrate intake to 30 to 60 grams per meal to help with blood sugar control.   The need to take medication as prescribed, test blood sugar as directed, and to call between visits if there is a concern that blood sugar is uncontrolled is also discussed.   Ms. Lukes is reminded of the importance of daily foot exam, annual eye examination, and good blood sugar, blood pressure and cholesterol control. Controlled, no change in medication   Diabetic Labs Latest Ref Rng & Units 03/06/2018 01/24/2018 08/31/2017 01/08/2017 12/12/2016  HbA1c <5.7 % of total Hgb 6.8(H) - 6.7(H) 7.1(H) -  Microalbumin mg/dL 1.7 13.9(H) - - 2.7  Micro/Creat Ratio <30 mcg/mg creat 10 9.0 - - 9  Chol <200 mg/dL 154 - - 157 -  HDL >50 mg/dL 45(L) - - 37(L) -  Calc LDL mg/dL (calc) 96 - - 107(H) -  Triglycerides <150 mg/dL 50 - - 64 -  Creatinine 0.50 - 1.10 mg/dL 0.95 - 0.93 0.88 -  BP/Weight 05/15/2018 03/06/2018 01/23/2018 12/05/2017 09/02/2017 06/06/2017 62/70/3500  Systolic BP 938 182 993 716 967 893 -  Diastolic BP 86 88 82 82 84 78 -  Wt. (Lbs) 221.12 215 212 210 212 214 219.04  BMI 43.18 35.78 35.28 34.95 35.28 35.61 36.45  Some encounter information is confidential and restricted. Go to Review Flowsheets activity to see all data.   Foot/eye exam completion dates Latest Ref Rng & Units 01/23/2018 01/23/2018  Eye Exam No Retinopathy No  Retinopathy -  Foot Form Completion - - Done        Essential hypertension Controlled, no change in medication DASH diet and commitment to daily physical activity for a minimum of 30 minutes discussed and encouraged, as a part of hypertension management. The importance of attaining a healthy weight is also discussed.  BP/Weight 05/15/2018 03/06/2018 01/23/2018 12/05/2017 09/02/2017 06/06/2017 81/07/7508  Systolic BP 258 527 782 423 536 144 -  Diastolic BP 86 88 82 82 84 78 -  Wt. (Lbs) 221.12 215 212 210 212 214 219.04  BMI 43.18 35.78 35.28 34.95 35.28 35.61 36.45  Some encounter information is confidential and restricted. Go to Review Flowsheets activity to see all data.       Morbid obesity due to excess calories ([facility]) Deteriorated. Patient re-educated about  the importance of commitment to a  minimum of 150 minutes of exercise per week.  The importance of healthy food choices with portion control discussed. Encouraged to start a food diary, count calories and to consider  joining a support group. Sample diet sheets offered. Goals set by the patient for the next several months.   Weight /BMI 05/15/2018 03/06/2018 01/23/2018  WEIGHT 221 lb 1.9 oz 215 lb 212 lb  HEIGHT 5\' 0"  5\' 5"  5\' 5"   BMI 43.18 kg/m2 35.78 kg/m2 35.28 kg/m2  Some encounter information is confidential and restricted. Go to Review Flowsheets activity to see all data.      IDA (iron deficiency anemia) Denies heavy menses , has IDa, AA age over 77 and Mother dx with colon ca at age 58, refer to GI  Major depressive disorder, recurrent episode, moderate ([facility]) Slightly improved, treated by Psychiatry

## 2018-05-26 NOTE — Assessment & Plan Note (Signed)
Controlled, no change in medication DASH diet and commitment to daily physical activity for a minimum of 30 minutes discussed and encouraged, as a part of hypertension management. The importance of attaining a healthy weight is also discussed.  BP/Weight 05/15/2018 03/06/2018 01/23/2018 12/05/2017 09/02/2017 06/06/2017 07/37/1062  Systolic BP 694 854 627 035 009 381 -  Diastolic BP 86 88 82 82 84 78 -  Wt. (Lbs) 221.12 215 212 210 212 214 219.04  BMI 43.18 35.78 35.28 34.95 35.28 35.61 36.45  Some encounter information is confidential and restricted. Go to Review Flowsheets activity to see all data.

## 2018-05-26 NOTE — Assessment & Plan Note (Signed)
Mary Roman is reminded of the importance of commitment to daily physical activity for 30 minutes or more, as able and the need to limit carbohydrate intake to 30 to 60 grams per meal to help with blood sugar control.   The need to take medication as prescribed, test blood sugar as directed, and to call between visits if there is a concern that blood sugar is uncontrolled is also discussed.   Mary Roman is reminded of the importance of daily foot exam, annual eye examination, and good blood sugar, blood pressure and cholesterol control. Controlled, no change in medication   Diabetic Labs Latest Ref Rng & Units 03/06/2018 01/24/2018 08/31/2017 01/08/2017 12/12/2016  HbA1c <5.7 % of total Hgb 6.8(H) - 6.7(H) 7.1(H) -  Microalbumin mg/dL 1.7 13.9(H) - - 2.7  Micro/Creat Ratio <30 mcg/mg creat 10 9.0 - - 9  Chol <200 mg/dL 154 - - 157 -  HDL >50 mg/dL 45(L) - - 37(L) -  Calc LDL mg/dL (calc) 96 - - 107(H) -  Triglycerides <150 mg/dL 50 - - 64 -  Creatinine 0.50 - 1.10 mg/dL 0.95 - 0.93 0.88 -   BP/Weight 05/15/2018 03/06/2018 01/23/2018 12/05/2017 09/02/2017 06/06/2017 29/92/4268  Systolic BP 341 962 229 798 921 194 -  Diastolic BP 86 88 82 82 84 78 -  Wt. (Lbs) 221.12 215 212 210 212 214 219.04  BMI 43.18 35.78 35.28 34.95 35.28 35.61 36.45  Some encounter information is confidential and restricted. Go to Review Flowsheets activity to see all data.   Foot/eye exam completion dates Latest Ref Rng & Units 01/23/2018 01/23/2018  Eye Exam No Retinopathy No Retinopathy -  Foot Form Completion - - Done

## 2018-05-26 NOTE — Assessment & Plan Note (Signed)
Denies heavy menses , has IDa, AA age over 29 and Mother dx with colon ca at age 48, refer to GI

## 2018-05-26 NOTE — Assessment & Plan Note (Signed)
The patient's Controlled Substance registry is reviewed and compliance confirmed. Adequacy of  Pain control and level of function is assessed. Medication dosing is adjusted as deemed appropriate. Twelve weeks of medication is prescribed , patient signs for the script and is provided with a follow up appointment between 11 to 12 weeks .  

## 2018-05-26 NOTE — Patient Instructions (Signed)
1.Continueduloxetine 120 mg daily  2.ContinueWellbutrin 300mg  daily  3. Continue Abilify 2 mg daily  4.Return to clinic in one month for15 mins

## 2018-05-26 NOTE — Assessment & Plan Note (Signed)
Deteriorated. Patient re-educated about  the importance of commitment to a  minimum of 150 minutes of exercise per week.  The importance of healthy food choices with portion control discussed. Encouraged to start a food diary, count calories and to consider  joining a support group. Sample diet sheets offered. Goals set by the patient for the next several months.   Weight /BMI 05/15/2018 03/06/2018 01/23/2018  WEIGHT 221 lb 1.9 oz 215 lb 212 lb  HEIGHT 5\' 0"  5\' 5"  5\' 5"   BMI 43.18 kg/m2 35.78 kg/m2 35.28 kg/m2  Some encounter information is confidential and restricted. Go to Review Flowsheets activity to see all data.

## 2018-05-29 ENCOUNTER — Ambulatory Visit (INDEPENDENT_AMBULATORY_CARE_PROVIDER_SITE_OTHER): Payer: No Typology Code available for payment source | Admitting: Psychiatry

## 2018-05-29 DIAGNOSIS — F331 Major depressive disorder, recurrent, moderate: Secondary | ICD-10-CM

## 2018-05-29 NOTE — Progress Notes (Signed)
Patient:  Mary Roman   DOB: 03/03/1971  MR Number: [id number]    Location: [facility]:  [address] [place],  [place], [address]                         Start:     Thursday 05/29/2018 8:10 AM  End: Thursday 05/29/2018 8:55 AM         Provider/Observer:     Maurice Small, MSW, LCSW   Chief Complaint:           Depression/anxiety              Reason For Service:     Mary Roman is a 47 y.o. female who is referred for services by psychiatrist Dr. Modesta Messing. Patient reports trying to cope with anxiety, depression, and everyday stress. She states having problems with finances and occasionally work. She says most of her stress is related to not having enough time in the day to do what she needs to do. 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 Patient has 2 children  and a fiance. She is the sole breadwinner for her home. She states having difficulty getting out of bed in the mornings, having to really push herself, having no energy, poor motivation, diminished interest in activities, and sleeping excessively.      Interventions Strategy:  Supportive/CBT  Participation Level:   Active  Participation Quality:  Appropriate      Behavioral Observation:  Casual, Alert, and depressed      Current Psychosocial Factors: Caretaker responsibilities for mother who had a stroke 5 years ago, financial stress, discord with siblings, job stress, discord in relationship with fiancee,   Content of Session:   Reviewed symptoms, praised and reinforced patient's completion of homework, discussed effects on mood/behavior/thoughts and interaction with others, assisted patient develop plan to maintain walking plan and improved self-care regarding eating patterns (attend appointments for medication management and comply with medication plan, keep play list available to use when walking, read index card used in last session daily, use spirituality and prayer  daily to maintain focus on goals, use a gratitude journal daily to increase positive thoughts, plan meals to maintain healthy eating patterns, limit soft drinks to 1 per day), assigned patient to implement strategies discussed in session   Current Status:     improved mood, increased motivation, decreased anxiety, increased interest/involvement in activities,   Suicidal/Homicidal:   No                Patient Progress:   Patient last was seen about 2 weeks ago. She reports feeling much better since last session. She reports use of abilify as prescribed by Dr. Modesta Messing has helped. She implemented walking plan and reports increased energy, thinking more clearly, and being less stressed. She reports she is able to handle some issues much better. She has improved her eating patterns and has reduced consumption of soft drinks to 1 per day. She  is very pleased with her progress and a 3 pound weight loss. She also reports improvement in relationship with fiancee who now has a third shift job and likes it.   Target Goals:   1. Learn and implement calming strategies to reduce/manage overall anxiety.    2. Learn and implement behavioral strategies to overcome depression.     Last Reviewed:   05/15/2018  Goals Addressed Today:    1,2,3,4  Plan:      Return again in 2  weeks.  Impression/Diagnosis:  Patient presents with a history of symptoms of depression that initially began about 5 years ago after her mother had a stroke. Symptoms have waxed and waned since that time. She and one of her siblings assumed most of the caretaker responsibilities for her mother. Symptoms worsened in the past couple of months as her sibling who had helped her with mother moved to [place] and no longer is as available to assist patient with their mother's care. Patient also has multiple other stressors. Her symptoms include low energy depressed mood, poor concentration, feelings of hopelessness, irritability, changes in appetite,  sleep difficulty, feelings of worthlessness, tearfulness, anxiety, muscle tension, excessive worrying                    Diagnosis:  Axis I: Major depressive disorder, recurrent, moderate          Axis II: Deferred  Haely Leyland, LCSW 05/29/2018

## 2018-05-30 ENCOUNTER — Ambulatory Visit (INDEPENDENT_AMBULATORY_CARE_PROVIDER_SITE_OTHER): Payer: Self-pay | Admitting: Internal Medicine

## 2018-06-02 ENCOUNTER — Other Ambulatory Visit: Payer: Self-pay | Admitting: Family Medicine

## 2018-06-02 DIAGNOSIS — Z1231 Encounter for screening mammogram for malignant neoplasm of breast: Secondary | ICD-10-CM

## 2018-06-04 ENCOUNTER — Telehealth: Payer: Self-pay

## 2018-06-04 ENCOUNTER — Other Ambulatory Visit: Payer: Self-pay | Admitting: Family Medicine

## 2018-06-04 MED ORDER — ONDANSETRON HCL 4 MG PO TABS: ORAL_TABLET | ORAL | 1 refills | Status: DC

## 2018-06-04 NOTE — Telephone Encounter (Signed)
Dr Modesta Messing started her on abilify recently and its been causing off and on nausea and dr Modesta Messing recommended she call her PCP to get some prn nausea medication sent to [facility] in [place] so she can pick up today. Please advise

## 2018-06-04 NOTE — Telephone Encounter (Signed)
Medication is sent in 

## 2018-06-05 ENCOUNTER — Other Ambulatory Visit: Payer: Self-pay

## 2018-06-05 MED ORDER — ONDANSETRON HCL 4 MG PO TABS: ORAL_TABLET | ORAL | 1 refills | Status: DC

## 2018-06-12 ENCOUNTER — Telehealth: Payer: Self-pay

## 2018-06-12 ENCOUNTER — Ambulatory Visit (HOSPITAL_COMMUNITY): Payer: Self-pay | Admitting: Psychiatry

## 2018-06-12 MED FILL — ARIPiprazole 2 MG TABS: 2 | 30 days supply | Qty: 30 | Fill #0

## 2018-06-12 MED FILL — DULoxetine HCL 60 MG CPEP: 60 | 30 days supply | Qty: 60 | Fill #0

## 2018-06-12 NOTE — Telephone Encounter (Signed)
Mary Roman said that her pain medication was supposed to go to [facility] in [place] but it was sent to [facility]. She had me call out to [facility] to cancel those rx's so they could be resent to [facility] in [place]. She wants to be able to pick them up tomorrow.   (the 3 hydrocodone rx's were cancelled at [facility])

## 2018-06-13 ENCOUNTER — Other Ambulatory Visit: Payer: Self-pay | Admitting: Family Medicine

## 2018-06-13 ENCOUNTER — Encounter: Payer: Self-pay | Admitting: Cardiology

## 2018-06-13 MED ORDER — HYDROCODONE-ACETAMINOPHEN 5-325 MG PO TABS: ORAL_TABLET | ORAL | 0 refills | Status: DC

## 2018-06-13 MED ORDER — HYDROCODONE-ACETAMINOPHEN 5-325 MG PO TABS: ORAL_TABLET | ORAL | 0 refills | Status: AC

## 2018-06-13 NOTE — Telephone Encounter (Signed)
Medication is prescribed, sent to [facility]

## 2018-06-13 NOTE — Progress Notes (Deleted)
Cardiology Office Note  Date: 06/13/2018   ID: Mary Roman, DOB September 29, 1970, MRN [id number]  PCP: Fayrene Helper, MD  Consulting Cardiologist: Rozann Lesches, MD   No chief complaint on file.   History of Present Illness: Mary Roman is a 47 y.o. female referred for cardiology consultation by Dr. Moshe Cipro for evaluation of palpitations.  Record review finds previous cardiac follow-up by Dr. Lattie Haw, last seen in 2013.  She was reporting palpitations at that time as well, improved on low-dose beta-blocker and with reduced caffeine use.  She had a negative exercise echocardiogram for ischemia in 2012.  Past Medical History:  Diagnosis Date  . Anxiety   . Chronic back pain   . Diabetes mellitus, type II ([place])   . Headache(784.0)   . Hypertension   . Obesity   . Palpitation   . Thyromegaly     Past Surgical History:  Procedure Laterality Date  . None      Current Outpatient Medications  Medication Sig Dispense Refill  . ARIPiprazole (ABILIFY) 2 MG tablet Take 1 tablet (2 mg total) by mouth daily. 30 tablet 0  . buPROPion (WELLBUTRIN XL) 300 MG 24 hr tablet Take 1 tablet (300 mg total) by mouth daily. 90 tablet 0  . Cyanocobalamin (VITAMIN B-12 PO) Take by mouth.    . DULoxetine (CYMBALTA) 60 MG capsule Take 2 capsules (120 mg total) by mouth daily. 60 capsule 0  . ergocalciferol (VITAMIN D2) 50000 units capsule Take 1 capsule (50,000 Units total) by mouth once a week. One capsule once weekly 12 capsule 1  . glipiZIDE (GLUCOTROL XL) 2.5 MG 24 hr tablet Take 1 tablet (2.5 mg total) by mouth daily with breakfast. 90 tablet 1  . HYDROcodone-acetaminophen (NORCO/VICODIN) 5-325 MG tablet Take one tablet once daily for back pain 30 tablet 0  . HYDROcodone-acetaminophen (NORCO/VICODIN) 5-325 MG tablet One tablet at bedtime for pain 30 tablet 0  . [START ON 06/14/2018] HYDROcodone-acetaminophen (NORCO/VICODIN) 5-325 MG tablet One tablet at bedtime for back pain  30 tablet 0  . [START ON 07/14/2018] HYDROcodone-acetaminophen (NORCO/VICODIN) 5-325 MG tablet Take one tablet at bedtime for back pain 30 tablet 0  . metoprolol tartrate (LOPRESSOR) 25 MG tablet Take 1 tablet (25 mg total) by mouth daily. 90 tablet 1  . ondansetron (ZOFRAN) 4 MG tablet One tablet twice daily , as needed, for nausea 30 tablet 1  . temazepam (RESTORIL) 30 MG capsule TAKE 1 CAPSULE BY MOUTH ONCE DAILY AT BEDTIME AS NEEDED FOR SLEEP 90 capsule 1  . triamterene-hydrochlorothiazide (MAXZIDE-25) 37.5-25 MG tablet Take 1 tablet by mouth daily. 90 tablet 3   No current facility-administered medications for this visit.    Allergies:  Patient has no known allergies.   Social History: The patient  reports that she has never smoked. She has never used smokeless tobacco. She reports that she does not drink alcohol or use drugs.   Family History: The patient's family history includes Alcohol abuse in her mother; Anxiety disorder in her sister; Cancer - Colon (age of onset: 70) in her mother; Colon cancer in her mother; Diabetes in her maternal grandmother and mother; Heart attack in her maternal uncle; Hypertension in her maternal grandfather, maternal grandmother, mother, sister, and sister; Leukemia in her sister; Pneumonia in her father; Stroke in her mother.   ROS:  Please see the history of present illness. Otherwise, complete review of systems is positive for {NONE DEFAULTED:18576::"none"}.  All other systems are reviewed and negative.  Physical Exam: VS:  There were no vitals taken for this visit., BMI There is no height or weight on file to calculate BMI.  Wt Readings from Last 3 Encounters:  05/15/18 221 lb 1.9 oz (100.3 kg)  03/06/18 215 lb (97.5 kg)  01/23/18 212 lb (96.2 kg)    General: Patient appears comfortable at rest. HEENT: Conjunctiva and lids normal, oropharynx clear with moist mucosa. Neck: Supple, no elevated JVP or carotid bruits, no thyromegaly. Lungs: Clear to  auscultation, nonlabored breathing at rest. Cardiac: Regular rate and rhythm, no S3 or significant systolic murmur, no pericardial rub. Abdomen: Soft, nontender, no hepatomegaly, bowel sounds present, no guarding or rebound. Extremities: No pitting edema, distal pulses 2+. Skin: Warm and dry. Musculoskeletal: No kyphosis. Neuropsychiatric: Alert and oriented x3, affect grossly appropriate.  ECG: I personally reviewed the tracing from 08/04/2010 which showed sinus rhythm with Q in lead III, possible right atrial enlargement.  Recent Labwork: 03/06/2018: ALT 10; AST 12; BUN 14; Creat 0.95; Hemoglobin 10.9; Platelets 432; Potassium 3.6; Sodium 140; TSH 1.23     Component Value Date/Time   CHOL 154 03/06/2018 0928   TRIG 50 03/06/2018 0928   HDL 45 (L) 03/06/2018 0928   CHOLHDL 3.4 03/06/2018 0928   VLDL 13 01/08/2017 0924   LDLCALC 96 03/06/2018 0928    Other Studies Reviewed Today:  Exercise echocardiogram 08/15/2010: Study Conclusions  - Stress ECG conclusions: There were no stress arrhythmias or conduction abnormalities. The stress ECG was negative for ischemia. - Staged echo: There was no echocardiographic evidence for stress-induced ischemia.  Assessment and Plan:   Current medicines were reviewed with the patient today.  No orders of the defined types were placed in this encounter.   Disposition:  Signed, Satira Sark, MD, [facility] 06/13/2018 8:45 AM    [facility] [address], [place], [place] [address] Phone: [phone]; Fax: [phone]

## 2018-06-16 ENCOUNTER — Ambulatory Visit: Payer: Self-pay | Admitting: Cardiology

## 2018-06-18 ENCOUNTER — Ambulatory Visit (HOSPITAL_COMMUNITY)
Admission: RE | Admit: 2018-06-18 | Discharge: 2018-06-18 | Disposition: A | Discharge status: Home / Self Care | Payer: No Typology Code available for payment source | Source: Ambulatory Visit | Attending: Family Medicine | Admitting: Family Medicine

## 2018-06-18 ENCOUNTER — Encounter (HOSPITAL_COMMUNITY): Payer: Self-pay

## 2018-06-18 DIAGNOSIS — Z1231 Encounter for screening mammogram for malignant neoplasm of breast: Secondary | ICD-10-CM | POA: Diagnosis present

## 2018-06-23 ENCOUNTER — Encounter (INDEPENDENT_AMBULATORY_CARE_PROVIDER_SITE_OTHER): Payer: Self-pay | Admitting: Internal Medicine

## 2018-06-23 ENCOUNTER — Telehealth (INDEPENDENT_AMBULATORY_CARE_PROVIDER_SITE_OTHER): Payer: Self-pay | Admitting: *Deleted

## 2018-06-23 ENCOUNTER — Ambulatory Visit (INDEPENDENT_AMBULATORY_CARE_PROVIDER_SITE_OTHER): Payer: No Typology Code available for payment source | Admitting: Psychiatry

## 2018-06-23 ENCOUNTER — Encounter (INDEPENDENT_AMBULATORY_CARE_PROVIDER_SITE_OTHER): Payer: Self-pay | Admitting: *Deleted

## 2018-06-23 ENCOUNTER — Ambulatory Visit (INDEPENDENT_AMBULATORY_CARE_PROVIDER_SITE_OTHER): Payer: Self-pay | Admitting: Internal Medicine

## 2018-06-23 VITALS — BP 142/90 | HR 60 | Temp 97.4°F | Ht 65.0 in | Wt 223.4 lb

## 2018-06-23 DIAGNOSIS — D649 Anemia, unspecified: Secondary | ICD-10-CM

## 2018-06-23 DIAGNOSIS — D508 Other iron deficiency anemias: Secondary | ICD-10-CM

## 2018-06-23 DIAGNOSIS — Z800 Family history of malignant neoplasm of digestive organs: Secondary | ICD-10-CM | POA: Insufficient documentation

## 2018-06-23 DIAGNOSIS — F331 Major depressive disorder, recurrent, moderate: Secondary | ICD-10-CM | POA: Diagnosis not present

## 2018-06-23 HISTORY — DX: Anemia, unspecified: D64.9

## 2018-06-23 MED ORDER — SUPREP BOWEL PREP KIT 17.5-3.13-1.6 GM/177ML PO SOLN: 1.0000 | Freq: Once | ORAL | 0 refills | Status: AC

## 2018-06-23 NOTE — Telephone Encounter (Signed)
Patient needs suprep 

## 2018-06-23 NOTE — Progress Notes (Signed)
Subjective:    Patient ID: Mary Roman, female    DOB: 1970-08-24, 47 y.o.   MRN: [id number]  HPI Referred by Dr. Moshe Cipro for anemia. Recent CBC revealed hemoglobin of 10.9.  Hemoglobin appears to run be 10.9 and 11.8 over the years.  She says she likes ice. She crunches ice frequently. She still has periods. Periods are not heavy. Has a period once a month.  She thinks she is going thru menopause. She has skipped some months.  Family hx of colon cancer in a mother age 34 and is still alive No NSAIDs.  Appetite is good. No weight loss. No change in her stools.  No melena or BRRB.  Recent Iron 36 (Normal 40-190). 03/06/2018 Vitamin B12  271 03/06/2018 03/06/2018 6.8 HA1C    CBC    Component Value Date/Time   WBC 6.6 03/06/2018 0928   RBC 3.93 03/06/2018 0928   HGB 10.9 (L) 03/06/2018 0928   HCT 32.9 (L) 03/06/2018 0928   PLT 432 (H) 03/06/2018 0928   MCV 83.7 03/06/2018 0928   MCH 27.7 03/06/2018 0928   MCHC 33.1 03/06/2018 0928   RDW 13.0 03/06/2018 0928   LYMPHSABS 2,178 01/08/2017 0924   MONOABS 396 01/08/2017 0924   EOSABS 198 01/08/2017 0924   BASOSABS 66 01/08/2017 0924      Review of Systems Past Medical History:  Diagnosis Date  . Anxiety   . Chronic back pain   . Diabetes mellitus, type II ([place])   . Headache(784.0)   . Hypertension   . Obesity   . Palpitation   . Thyromegaly     Past Surgical History:  Procedure Laterality Date  . None      No Known Allergies  Current Outpatient Medications on File Prior to Visit  Medication Sig Dispense Refill  . ARIPiprazole (ABILIFY) 2 MG tablet Take 1 tablet (2 mg total) by mouth daily. 30 tablet 0  . buPROPion (WELLBUTRIN XL) 300 MG 24 hr tablet Take 1 tablet (300 mg total) by mouth daily. 90 tablet 0  . Cyanocobalamin (VITAMIN B-12 PO) Take by mouth.    . DULoxetine (CYMBALTA) 60 MG capsule Take 2 capsules (120 mg total) by mouth daily. 60 capsule 0  . HYDROcodone-acetaminophen (NORCO/VICODIN) 5-325  MG tablet Take one tablet once daily for back pain 30 tablet 0  . metoprolol tartrate (LOPRESSOR) 25 MG tablet Take 1 tablet (25 mg total) by mouth daily. 90 tablet 1  . ondansetron (ZOFRAN) 4 MG tablet One tablet twice daily , as needed, for nausea 30 tablet 1  . temazepam (RESTORIL) 30 MG capsule TAKE 1 CAPSULE BY MOUTH ONCE DAILY AT BEDTIME AS NEEDED FOR SLEEP 90 capsule 1  . triamterene-hydrochlorothiazide (MAXZIDE-25) 37.5-25 MG tablet Take 1 tablet by mouth daily. 90 tablet 3  . glipiZIDE (GLUCOTROL XL) 2.5 MG 24 hr tablet Take 1 tablet (2.5 mg total) by mouth daily with breakfast. 90 tablet 1  . [DISCONTINUED] ferrous sulfate 325 (65 FE) MG EC tablet Take 325 mg by mouth 3 (three) times daily with meals.     No current facility-administered medications on file prior to visit.         Objective:   Physical Exam Blood pressure (!) 142/90, pulse 60, temperature (!) 97.4 F (36.3 C), height 5\' 5"  (1.651 m), weight 223 lb 6.4 oz (101.3 kg). Alert and oriented. Skin warm and dry. Oral mucosa is moist.   . Sclera anicteric, conjunctivae is pink. Thyroid not enlarged. No cervical  lymphadenopathy. Lungs clear. Heart regular rate and rhythm.  Abdomen is soft. Bowel sounds are positive. No hepatomegaly. No abdominal masses felt. No tenderness.  No edema to lower extremities.  Rectal: no masses. Guaiac negative.         Assessment & Plan:  IDA, H and H , iron studies. Colonoscopy possible EGD.  Colonic neoplasm needs to be ruled out

## 2018-06-23 NOTE — Progress Notes (Signed)
Patient:  Mary Roman   DOB: 05-05-1971  MR Number: [id number]    Location: [facility]:  [address] [place],  [place], [address]                         Start:     Monday 06/23/2018 8:20 AM   End: Monday 06/23/2018 9:05 AM        Provider/Observer:     Maurice Small, MSW, LCSW   Chief Complaint:           Depression/anxiety              Reason For Service:     [name] is a 47 y.o. female who is referred for services by psychiatrist Dr. Modesta Messing. Patient reports trying to cope with anxiety, depression, and everyday stress. She states having problems with finances and occasionally work. She says most of her stress is related to not having enough time in the day to do what she needs to do. 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 Patient has 2 children  and a fiance. She is the sole breadwinner for her home. She states having difficulty getting out of bed in the mornings, having to really push herself, having no energy, poor motivation, diminished interest in activities, and sleeping excessively.      Interventions Strategy:  Supportive/CBT  Participation Level:   Active  Participation Quality:  Appropriate      Behavioral Observation:  Casual, Alert, and euthymic     Current Psychosocial Factors: Caretaker responsibilities for mother who had a stroke 5 years ago, financial stress, discord with siblings, job stress, discord in relationship with fiancee,   Content of Session:   Reviewed symptoms, praised and reinforced patient's completion of homework, discussed effects on mood/behavior/thoughts and interaction with others, discussed connection between mood/thoughts/and behavior using handout of cognitive model and examples from patient's life, introduced and discussed rationale for completing practice exercises on the cognitive model, assigned patient to complete practice exercises and bring to next session, assigned patient to  continue positive self-care strategies including using her spirituality/gratitude journal  Current Status:     improved mood, increased motivation, decreased anxiety, increased interest/involvement in activities,   Suicidal/Homicidal:   No                Patient Progress:   Patient last was seen about 4  weeks ago. She reports continuing to feel better. She has continued positive self care efforts including a walking plan, making better food choices and using her spirituality. Patient reports positive effects on her interaction with her family. She also states feeling lighter as she doesn't take on other people's burdens or problems as she has in the past. She also reports being more productive and completing projects at home. At work, she reports being able to let things roll off her back.  Target Goals:   1. Learn and implement calming strategies to reduce/manage overall anxiety.    2. Learn and implement behavioral strategies to overcome depression.     Last Reviewed:   05/15/2018  Goals Addressed Today:    1,2,  Plan:      Return again in 2  weeks.  Impression/Diagnosis:  Patient presents with a history of symptoms of depression that initially began about 5 years ago after her mother had a stroke. Symptoms have waxed and waned since that time. She and one  of her siblings assumed most of the caretaker responsibilities for her mother. Symptoms worsened in the past couple of months as her sibling who had helped her with mother moved to [place] and no longer is as available to assist patient with their mother's care. Patient also has multiple other stressors. Her symptoms include low energy depressed mood, poor concentration, feelings of hopelessness, irritability, changes in appetite, sleep difficulty, feelings of worthlessness, tearfulness, anxiety, muscle tension, excessive worrying                    Diagnosis:  Axis I: Major depressive disorder, recurrent, moderate          Axis II:  Deferred  BYNUM,PEGGY, LCSW 06/23/2018

## 2018-06-23 NOTE — Patient Instructions (Signed)
The risks of bleeding, perforation and infection were reviewed with patient.  

## 2018-07-07 ENCOUNTER — Ambulatory Visit (HOSPITAL_COMMUNITY): Payer: Self-pay | Admitting: Psychiatry

## 2018-07-07 NOTE — Progress Notes (Signed)
[place] MD/PA/NP OP Progress Note  07/10/2018 8:28 AM Mary Roman  MRN:  [id number]  Chief Complaint:  Chief Complaint    Follow-up; Depression     HPI:  - Patient is scheduled for colonoscopy for IDA  patient presents for follow-up appointment for depression.  She states that she tends to have "traumatic thing" in her brain, which makes her feel step back. She talks about her son's academic issues. She feels that she is failing in parenting. She agrees to explore activities in line with her value rather than ruminating on those thoughts.  She believes that the relationship with her fianc has been getting better.  He has started a job for 3 months and he likes it.  She feels extremely fatigued during the day.  She has insomnia.  She has good appetite, with occasionally increased appetite when she is stressed.  She has fair concentration.  She denies SI.  She feels anxious at times.  She denies panic attacks.   Wt Readings from Last 3 Encounters:  07/10/18 225 lb (102.1 kg)  06/23/18 223 lb 6.4 oz (101.3 kg)  05/26/18 217 lb (98.4 kg)    Visit Diagnosis:    ICD-10-CM   1. Major depressive disorder, recurrent episode, moderate ([place]) F33.1     Past Psychiatric History: Please see initial evaluation for full details. I have reviewed the history. No updates at this time.     Past Medical History:  Past Medical History:  Diagnosis Date  . Anxiety   . Chronic back pain   . Diabetes mellitus, type II ([place])   . Headache(784.0)   . Hypertension   . Obesity   . Palpitation   . Thyromegaly     Past Surgical History:  Procedure Laterality Date  . None      Family Psychiatric History: Please see initial evaluation for full details. I have reviewed the history. No updates at this time.     Family History:  Family History  Problem Relation Age of Onset  . Diabetes Mother   . Hypertension Mother   . Stroke Mother   . Colon cancer Mother   . Alcohol abuse Mother   . Cancer  - Colon Mother 48  . Pneumonia Father   . Hypertension Sister   . Anxiety disorder Sister   . Hypertension Sister   . Leukemia Sister   . Diabetes Maternal Grandmother   . Hypertension Maternal Grandmother   . Hypertension Maternal Grandfather   . Heart attack Maternal Uncle     Social History:  Social History   Socioeconomic History  . Marital status: Single    Spouse name: Not on file  . Number of children: 2  . Years of education: Not on file  . Highest education level: Not on file  Occupational History  . 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 Financial resource strain: Not on file  . Food insecurity:    Worry: Not on file    Inability: Not on file  . Transportation needs:    Medical: Not on file    Non-medical: Not on file  Tobacco Use  . Smoking status: Never Smoker  . Smokeless tobacco: Never Used  Substance and Sexual Activity  . Alcohol use: No    Comment: 01-17-2017 per pt no  . Drug use: No    Comment: 01-17-2017 per pt no  . Sexual activity: Yes    Partners: Male    Birth control/protection: Other-see  comments    Comment: bf had vasectomy  Lifestyle  . Physical activity:    Days per week: Not on file    Minutes per session: Not on file  . Stress: Not on file  Relationships  . Social connections:    Talks on phone: Not on file    Gets together: Not on file    Attends religious service: Not on file    Active member of club or organization: Not on file    Attends meetings of clubs or organizations: Not on file    Relationship status: Not on file  Other Topics Concern  . Not on file  Social History Narrative  . Not on file    Allergies: No Known Allergies  Metabolic Disorder Labs: Lab Results  Component Value Date   HGBA1C 6.8 (H) 03/06/2018   MPG 148 03/06/2018   MPG 146 08/31/2017   No results found for: PROLACTIN Lab Results  Component Value Date   CHOL 154 03/06/2018   TRIG 50 03/06/2018   HDL 45 (L) 03/06/2018    CHOLHDL 3.4 03/06/2018   VLDL 13 01/08/2017   LDLCALC 96 03/06/2018   LDLCALC 107 (H) 01/08/2017   Lab Results  Component Value Date   TSH 1.23 03/06/2018   TSH 0.95 01/08/2017    Therapeutic Level Labs: No results found for: LITHIUM No results found for: VALPROATE No components found for:  CBMZ  Current Medications: Current Outpatient Medications  Medication Sig Dispense Refill  . buPROPion (WELLBUTRIN XL) 300 MG 24 hr tablet Take 1 tablet (300 mg total) by mouth daily. 90 tablet 0  . Cyanocobalamin (VITAMIN B-12 PO) Take by mouth.    . DULoxetine (CYMBALTA) 60 MG capsule Take 2 capsules (120 mg total) by mouth daily. 180 capsule 0  . HYDROcodone-acetaminophen (NORCO/VICODIN) 5-325 MG tablet Take one tablet once daily for back pain 30 tablet 0  . metoprolol tartrate (LOPRESSOR) 25 MG tablet Take 1 tablet (25 mg total) by mouth daily. 90 tablet 1  . ondansetron (ZOFRAN) 4 MG tablet One tablet twice daily , as needed, for nausea 30 tablet 1  . temazepam (RESTORIL) 30 MG capsule TAKE 1 CAPSULE BY MOUTH ONCE DAILY AT BEDTIME AS NEEDED FOR SLEEP 90 capsule 1  . triamterene-hydrochlorothiazide (MAXZIDE-25) 37.5-25 MG tablet Take 1 tablet by mouth daily. 90 tablet 3  . ARIPiprazole (ABILIFY) 5 MG tablet Take 1 tablet (5 mg total) by mouth daily. 30 tablet 1  . glipiZIDE (GLUCOTROL XL) 2.5 MG 24 hr tablet Take 1 tablet (2.5 mg total) by mouth daily with breakfast. 90 tablet 1   No current facility-administered medications for this visit.      Musculoskeletal: Strength & Muscle Tone: within normal limits Gait & Station: normal Patient leans: N/A  Psychiatric Specialty Exam: ROS  Blood pressure (!) 156/90, pulse 77, height 5\' 5"  (1.651 m), weight 225 lb (102.1 kg), SpO2 99 %.Body mass index is 37.44 kg/m.  General Appearance: Fairly Groomed  Eye Contact:  Good  Speech:  Clear and Coherent  Volume:  Normal  Mood:  Depressed  Affect:  Appropriate, Congruent and slightly down,  restricted  Thought Process:  Coherent  Orientation:  Full (Time, Place, and Person)  Thought Content: Logical   Suicidal Thoughts:  No  Homicidal Thoughts:  No  Memory:  Immediate;   Good  Judgement:  Good  Insight:  Fair  Psychomotor Activity:  Normal  Concentration:  Concentration: Good and Attention Span: Good  Recall:  Good  Fund of Knowledge: Good  Language: Good  Akathisia:  No  Handed:  Right  AIMS (if indicated): not done  Assets:  Communication Skills Desire for Improvement  ADL's:  Intact  Cognition: WNL  Sleep:  Poor   Screenings: GAD-7     Counselor from 05/15/2018 in [facility] Office Visit from 01/08/2017 in [facility]  Total GAD-7 Score  17  14    PHQ2-9     Office Visit from 05/15/2018 in [facility] Office Visit from 03/06/2018 in [facility] Office Visit from 01/23/2018 in [facility] Office Visit from 12/05/2017 in [facility] Counselor from 11/25/2017 in [facility]  PHQ-2 Total Score  2  6  4  6  6   PHQ-9 Total Score  10  24  19  22  18        Assessment and Plan:  NEFTALI THUROW is a 47 y.o. year old female with a history of depression, diabetes , who presents for follow up appointment for Major depressive disorder, recurrent episode, moderate ([place])  # MDD, moderate, recurrent without psychotic features Patient continues to have depressive symptoms since the last visit.  We will do up titration of Abilify as adjunctive treatment for depression.  Discussed potential metabolic side effect.  She is advised to take it at night if any worsening in nausea or drowsiness.  Will continue duloxetine to target depression.  Will continue Wellbutrin as adjunctive treatment for depression.  She has no known history of seizure.  Discussed behavioral activation, self compassion.   Plan I have reviewed and updated plans as  below 1.Continueduloxetine 120 mg daily  2.ContinueWellbutrin 300mg  daily 3. Increase Abilify 5 mg daily  4.Return to clinic in one month for15 mins 5. (Continue temazepam 30 mg at night as needed for sleep) - She will continue to see Ms Bynyum for therapy (She is on gabapentin for pain and hydroxyzine)  The patient demonstrates the following risk factors for suicide: Chronic risk factors for suicide include: psychiatric disorder of depression, anxietyand chronic pain. Acute risk factorsfor suicide include: N/A. Protective factorsfor this patient include: positive social support, responsibility to others (children, family), coping skills and hope for the future. Considering these factors, the overall suicide risk at this point appears to be low. Patient isappropriate for outpatient follow up.  [name], MD 07/10/2018, 8:28 AM

## 2018-07-10 ENCOUNTER — Ambulatory Visit (INDEPENDENT_AMBULATORY_CARE_PROVIDER_SITE_OTHER): Payer: No Typology Code available for payment source | Admitting: Psychiatry

## 2018-07-10 ENCOUNTER — Encounter (HOSPITAL_COMMUNITY): Payer: Self-pay | Admitting: Psychiatry

## 2018-07-10 VITALS — BP 156/90 | HR 77 | Ht 65.0 in | Wt 225.0 lb

## 2018-07-10 DIAGNOSIS — F331 Major depressive disorder, recurrent, moderate: Secondary | ICD-10-CM | POA: Diagnosis not present

## 2018-07-10 MED ORDER — ARIPIPRAZOLE 5 MG PO TABS: 5.0000 mg | ORAL_TABLET | Freq: Every day | ORAL | 1 refills | Status: DC

## 2018-07-10 MED ORDER — BUPROPION HCL ER (XL) 300 MG PO TB24: 300.0000 mg | ORAL_TABLET | Freq: Every day | ORAL | 0 refills | Status: DC

## 2018-07-10 MED ORDER — DULOXETINE HCL 60 MG PO CPEP: 120.0000 mg | ORAL_CAPSULE | Freq: Every day | ORAL | 0 refills | Status: DC

## 2018-07-10 NOTE — Patient Instructions (Signed)
1.Continueduloxetine 120 mg daily  2.ContinueWellbutrin 300mg  daily 3. Increase Abilify 5 mg daily  4.Return to clinic in one month for15 mins

## 2018-07-16 MED FILL — buPROPion HCL ER (XL) 300 M: 300 | 90 days supply | Qty: 90 | Fill #0

## 2018-07-16 MED FILL — METOPROLOL TARTRATE 25 MG T: 25 | 90 days supply | Qty: 90 | Fill #1

## 2018-07-16 MED FILL — DULoxetine HCL 60 MG CPEP: 60 | 30 days supply | Qty: 60 | Fill #0

## 2018-07-16 MED FILL — TRIAMTERENE/HCTZ 37.5/25 TB: 37.5-25 | 90 days supply | Qty: 90 | Fill #2

## 2018-07-21 ENCOUNTER — Ambulatory Visit (INDEPENDENT_AMBULATORY_CARE_PROVIDER_SITE_OTHER): Payer: No Typology Code available for payment source | Admitting: Psychiatry

## 2018-07-21 DIAGNOSIS — F331 Major depressive disorder, recurrent, moderate: Secondary | ICD-10-CM

## 2018-07-21 NOTE — Progress Notes (Signed)
Patient:  Mary Roman   DOB: 1970-12-06  MR Number: [id number]    Location: [facility]:  [address] [place],  [place], [address]                         Start:     Monday 07/21/2018 8:15 AM  End: Monday 07/21/2018 8:59 AM   Provider/Observer:     Maurice Small, MSW, LCSW   Chief Complaint:           Depression/anxiety              Reason For Service:     [name] is a 47 y.o. female who is referred for services by psychiatrist Dr. Modesta Messing. Patient reports trying to cope with anxiety, depression, and everyday stress. She states having problems with finances and occasionally work. She says most of her stress is related to not having enough time in the day to do what she needs to do. 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 Patient has 2 children  and a fiance. She is the sole breadwinner for her home. She states having difficulty getting out of bed in the mornings, having to really push herself, having no energy, poor motivation, diminished interest in activities, and sleeping excessively.      Interventions Strategy:  Supportive/CBT  Participation Level:   Active  Participation Quality:  Appropriate      Behavioral Observation:  Casual, Alert, and euthymic     Current Psychosocial Factors: Caretaker responsibilities for mother who had a stroke 5 years ago, financial stress, discord with siblings, job stress, discord in relationship with fiancee,   Content of Session:   Reviewed symptoms, praised and reinforced patient's completion of homework, processed homework, reviewed connection  between mood/thoughts/and behavior, introduced and discussed rationale for completing practice exercises on the cognitive model using examples from patient's life, assigned patient to complete practice exercises and bring to next session, assigned patient to continue positive self-care strategies  Current Status:     improved mood, increased  motivation, decreased anxiety, increased interest/involvement in activities,   Suicidal/Homicidal:   No                Patient Progress:   Patient last was seen about 4  weeks ago. She reports continued improved mood and interaction with others.  She has continued positive self care efforts but has not been walking as much due to being sick for about a week. She also reports she has been doing more emotional eating. She reports no particular trigger but plans to resume increased self-care efforts.   Target Goals:   1. Learn and implement calming strategies to reduce/manage overall anxiety.    2. Learn and implement behavioral strategies to overcome depression.     Last Reviewed:   05/15/2018  Goals Addressed Today:    1,2,  Plan:      Return again in 2  weeks.  Impression/Diagnosis:  Patient presents with a history of symptoms of depression that initially began about 5 years ago after her mother had a stroke. Symptoms have waxed and waned since that time. She and one of her siblings assumed most of the caretaker responsibilities for her mother. Symptoms worsened in the past couple of months as her sibling who had helped her with mother moved to [place] and no longer is as available to assist patient with their mother's care. Patient  also has multiple other stressors. Her symptoms include low energy depressed mood, poor concentration, feelings of hopelessness, irritability, changes in appetite, sleep difficulty, feelings of worthlessness, tearfulness, anxiety, muscle tension, excessive worrying                    Diagnosis:  Axis I: Major depressive disorder, recurrent, moderate          Axis II: Deferred  Kamree Wiens, LCSW 07/21/2018

## 2018-08-04 ENCOUNTER — Ambulatory Visit (INDEPENDENT_AMBULATORY_CARE_PROVIDER_SITE_OTHER): Payer: Self-pay | Admitting: Family Medicine

## 2018-08-04 ENCOUNTER — Encounter: Payer: Self-pay | Admitting: Family Medicine

## 2018-08-04 ENCOUNTER — Other Ambulatory Visit (HOSPITAL_COMMUNITY)
Admission: RE | Admit: 2018-08-04 | Discharge: 2018-08-04 | Disposition: A | Discharge status: Home / Self Care | Payer: Self-pay | Source: Ambulatory Visit | Attending: Family Medicine | Admitting: Family Medicine

## 2018-08-04 VITALS — BP 120/84 | HR 84 | Resp 15 | Ht 65.0 in | Wt 228.0 lb

## 2018-08-04 DIAGNOSIS — I10 Essential (primary) hypertension: Secondary | ICD-10-CM

## 2018-08-04 DIAGNOSIS — F331 Major depressive disorder, recurrent, moderate: Secondary | ICD-10-CM

## 2018-08-04 DIAGNOSIS — R52 Pain, unspecified: Secondary | ICD-10-CM

## 2018-08-04 DIAGNOSIS — E785 Hyperlipidemia, unspecified: Secondary | ICD-10-CM

## 2018-08-04 DIAGNOSIS — E669 Obesity, unspecified: Secondary | ICD-10-CM

## 2018-08-04 DIAGNOSIS — E1169 Type 2 diabetes mellitus with other specified complication: Secondary | ICD-10-CM

## 2018-08-04 DIAGNOSIS — D649 Anemia, unspecified: Secondary | ICD-10-CM

## 2018-08-04 MED ORDER — HYDROCODONE-ACETAMINOPHEN 5-325 MG PO TABS: ORAL_TABLET | ORAL | 0 refills | Status: AC

## 2018-08-04 NOTE — Progress Notes (Signed)
Fill Date ID Written Drug Qty Days Prescriber Rx # Pharmacy Refill Daily Dose * Pymt Type PMP  07/13/2018  1   06/13/2018  Hydrocodone-Acetamin 5-325 Mg  30.00 30 Ma Sim  [id number]  [facility] ([id number])  0/0 5.00 MME Comm Ins  [place]  06/13/2018  1   06/13/2018  Hydrocodone-Acetamin 5-325 Mg  30.00 30 Ma Sim  [id number]  [facility] ([id number])  0/0 5.00 MME Comm Ins  [place]  05/15/2018  1   03/06/2018  Hydrocodone-Acetamin 5-325 Mg  30.00 30 Ma Sim  [id number]  [facility] ([id number])  0/0 5.00 MME Comm Ins  [place]  05/14/2018  2   02/07/2018  Temazepam 30 Mg Capsule  90.00 90 Ma Sim  [id number]  [facility] ([id number])  1/1 1.50 LME Comm Ins  [place]  04/13/2018  1   03/06/2018  Hydrocodone-Acetamin 5-325 Mg  30.00 30 Ma Sim  [id number]  [facility] ([id number])  0/0 5.00 MME Comm Ins  [place]  03/13/2018  1   03/06/2018  Hydrocodone-Acetamin 5-325 Mg  30.00 30 Ma Sim  [id number]  [facility] ([id number])  0/0 5.00 MME Comm Ins  [place]  02/10/2018  1   12/05/2017  Hydrocodone-Acetamin 5-325 Mg  30.00 30 Ma Sim  [id number]  [facility] ([id number])  0/0 5.00 MME Comm Ins  [place]  02/10/2018  2   02/07/2018  Temazepam 30 Mg Capsule  90.00 90 Ma Sim  [id number]  [facility] ([id number])  0/1 1.50 LME Comm Ins  [place]  01/10/2018  1   12/05/2017  Hydrocodone-Acetamin 5-325 Mg  30.00 30 Ma Sim  [id number]  [facility] ([id number])  0/0 5.00 MME Comm Ins  [place]  12/12/2017  2   09/02/2017  Phentermine 37.5 Mg Tablet  30.00 30 Ma Sim  [id number]  [facility] ([id number])  2/2  Other  [place]  12/10/2017  1   12/05/2017  Hydrocodone-Acetamin 5-325 Mg  30.00 30 Ma Sim  [id number]  [facility] ([id number])  0/0 5.00 MME Comm Ins  [place]  12/05/2017  2   12/05/2017  Phentermine 37.5 Mg Tablet  30.00 30 Ma Sim  [id number]  [facility] (0906)  0/3  Other  [place]  11/10/2017  1   09/02/2017  Hydrocodone-Acetamin 5-325 Mg  30.00 30 Ma Sim  [id number]  [facility] ([id number])  0/0 5.00 MME Comm Ins  [place]  11/04/2017  2   09/02/2017  Phentermine 37.5 Mg Tablet  30.00 30 Ma Sim  [id number]  [facility] (0906)  1/2  Other  [place]  10/17/2017  2   06/27/2017  Temazepam 30 Mg Capsule  90.00 90 Ma Sim  [id number]  [facility] (0906)  1/1 1.50 LME Comm Ins  [place]  10/10/2017  1   09/02/2017   Hydrocodone-Acetamin 5-325 Mg  30.00 30 Ma Sim  [id number]  [facility] ([id number])  0/0 5.00 MME Comm Ins  [place]  09/20/2017  2   09/02/2017  Phentermine 37.5 Mg Tablet  30.00 30 Ma Sim  [id number]  [facility] (0350)  0/2  Other  [place]  09/12/2017  1   09/02/2017  Hydrocodone-Acetamin 5-325 Mg  30.00 30 Ma Sim  [id number]  [facility] ([id number])  0/0 5.00 MME Comm Ins  [place]  08/19/2017  2   06/27/2017  Phentermine 37.5 Mg Tablet  30.00 30 Ma Sim  [id number]  [facility] ([id number])  1/1  Other  [place]  08/13/2017  1   06/06/2017  Hydrocodone-Acetamin 5-325 Mg  30.00 30 Ma Sim  [id number]  [facility] ([id number])  0/0 5.00 MME Comm Ins    07/12/2017  1   06/06/2017  Hydrocodone-Acetamin 5-325 Mg  30.00 30 Ma Sim  [id number]  [facility] ([id number])  0/0 5.00 MME Comm Ins  [place]  06/27/2017  2   06/27/2017  Temazepam 30 Mg Capsule  90.00 90 Ma Sim  [id number]  [facility] (0906)  0/1

## 2018-08-04 NOTE — Progress Notes (Signed)
Mary Roman     MRN: [id number]      DOB: 1970-10-09   HPI Mary Roman is here for follow up and re-evaluation of chronic medical conditions, medication management and review of any available recent lab and radiology data.  Preventive health is updated, specifically  Cancer screening and Immunization.   Questions or concerns regarding consultations or procedures which the PT has had in the interim are  addressed. The PT denies any adverse reactions to current medications since the last visit Increased stress due to Mom's failing health, and disappointed with the slow progress of therapy Denies polyuria, polydipsia, blurred vision , or hypoglycemic episodes.  ROS Denies recent fever or chills. Denies sinus pressure, nasal congestion, ear pain or sore throat. Denies chest congestion, productive cough or wheezing. Denies chest pains, palpitations and leg swelling Denies abdominal pain, nausea, vomiting,diarrhea or constipation.   Denies dysuria, frequency, hesitancy or incontinence. Denies joint pain, swelling and limitation in mobility. Denies headaches, seizures, numbness, or tingling. c/o depression, anxiety , improving  Denies skin break down or rash.   PE  BP 120/84   Pulse 84   Resp 15   Ht 5\' 5"  (1.651 m)   Wt 228 lb (103.4 kg)   SpO2 99%   BMI 37.94 kg/m   Patient alert and oriented and in no cardiopulmonary distress.  HEENT: No facial asymmetry, EOMI,   oropharynx pink and moist.  Neck supple no JVD, no mass.  Chest: Clear to auscultation bilaterally.  CVS: S1, S2 no murmurs, no S3.Regular rate.  ABD: Soft non tender.   Ext: No edema  MS: Adequate ROM spine, shoulders, hips and knees.  Skin: Intact, no ulcerations or rash noted.  Psych: Good eye contact, normal affect. Memory intact not anxious or depressed appearing.  CNS: CN 2-12 intact, power,  normal throughout.no focal deficits noted.   Assessment & Plan  Encounter for pain  management The patient's Controlled Substance registry is reviewed and compliance confirmed. Adequacy of  Pain control and level of function is assessed. Medication dosing is adjusted as deemed appropriate. Twelve weeks of medication is prescribed , patient signs for the script and is provided with a follow up appointment between 11 to 12 weeks .   Diabetes mellitus type 2 in obese [facility]) Mary Roman is reminded of the importance of commitment to daily physical activity for 30 minutes or more, as able and the need to limit carbohydrate intake to 30 to 60 grams per meal to help with blood sugar control.   The need to take medication as prescribed, test blood sugar as directed, and to call between visits if there is a concern that blood sugar is uncontrolled is also discussed.   Mary Roman is reminded of the importance of daily foot exam, annual eye examination, and good blood sugar, blood pressure and cholesterol control.  Updated lab needed .  Diabetic Labs Latest Ref Rng & Units 03/06/2018 01/24/2018 08/31/2017 01/08/2017 12/12/2016  HbA1c <5.7 % of total Hgb 6.8(H) - 6.7(H) 7.1(H) -  Microalbumin mg/dL 1.7 13.9(H) - - 2.7  Micro/Creat Ratio <30 mcg/mg creat 10 9.0 - - 9  Chol <200 mg/dL 154 - - 157 -  HDL >50 mg/dL 45(L) - - 37(L) -  Calc LDL mg/dL (calc) 96 - - 107(H) -  Triglycerides <150 mg/dL 50 - - 64 -  Creatinine 0.50 - 1.10 mg/dL 0.95 - 0.93 0.88 -   BP/Weight 08/04/2018 06/23/2018 05/15/2018 03/06/2018 01/23/2018 02/05/239 04/06/3531  Systolic  BP 120 142 124 124 174 944 967  Diastolic BP 84 90 86 88 82 82 84  Wt. (Lbs) 228 223.4 221.12 215 212 210 212  BMI 37.94 37.18 43.18 35.78 35.28 34.95 35.28  Some encounter information is confidential and restricted. Go to Review Flowsheets activity to see all data.   Foot/eye exam completion dates Latest Ref Rng & Units 01/23/2018 01/23/2018  Eye Exam No Retinopathy No Retinopathy No Retinopathy  Foot Form Completion - - Done         Essential hypertension Controlled, no change in medication DASH diet and commitment to daily physical activity for a minimum of 30 minutes discussed and encouraged, as a part of hypertension management. The importance of attaining a healthy weight is also discussed.  BP/Weight 08/04/2018 06/23/2018 05/15/2018 03/06/2018 01/23/2018 12/05/1636 11/02/6597  Systolic BP 357 017 793 903 009 233 007  Diastolic BP 84 90 86 88 82 82 84  Wt. (Lbs) 228 223.4 221.12 215 212 210 212  BMI 37.94 37.18 43.18 35.78 35.28 34.95 35.28  Some encounter information is confidential and restricted. Go to Review Flowsheets activity to see all data.       Morbid obesity ([place]) Obesity linked with hypertension, diabetes , depression and chronic pain Deteriorated. Patient re-educated about  the importance of commitment to a  minimum of 150 minutes of exercise per week.  The importance of healthy food choices with portion control discussed. Encouraged to start a food diary, count calories and to consider  joining a support group. Sample diet sheets offered. Goals set by the patient for the next several months.   Weight /BMI 08/04/2018 06/23/2018 05/15/2018  WEIGHT 228 lb 223 lb 6.4 oz 221 lb 1.9 oz  HEIGHT 5\' 5"  5\' 5"  5\' 0"   BMI 37.94 kg/m2 37.18 kg/m2 43.18 kg/m2  Some encounter information is confidential and restricted. Go to Review Flowsheets activity to see all data.      Major depressive disorder, recurrent episode, moderate (H[facility]) Marked improvement and currently in remission,  Managed by Psychiatry, needs to continue esp wit therapy

## 2018-08-04 NOTE — Patient Instructions (Addendum)
F/u week of April  6 , call if you need me before  Lab test today ' Need to start coated aspirin 81 mg  Daily also you need a statin due to being diabetic   Fasting lipid, cmp and eGFR, hBA1C, cBC , iron and ferritin due please get trhis week  It is important that you exercise regularly at least 30 minutes 5 times a week. If you develop chest pain, have severe difficulty breathing, or feel very tired, stop exercising immediately and seek medical attention   It is important that you exercise regularly at least 30 minutes 5 times a week. If you develop chest pain, have severe difficulty breathing, or feel very tired, stop exercising immediately and seek medical attention  Please work on good  health habits so that your health will improve. 1. Commitment to daily physical activity for 30 to 60  minutes, if you are able to do this.  2. Commitment to wise food choices. Aim for half of your  food intake to be vegetable and fruit, one quarter starchy foods, and one quarter protein. Try to eat on a regular schedule  3 meals per day, snacking between meals should be limited to vegetables or fruits or small portions of nuts. 64 ounces of water per day is generally recommended, unless you have specific health conditions, like heart failure or kidney failure where you will need to limit fluid intake.  3. Commitment to sufficient and a  good quality of physical and mental rest daily, generally between 6 to 8 hours per day.  WITH PERSISTANCE AND PERSEVERANCE, THE IMPOSSIBLE , BECOMES THE NORM!

## 2018-08-05 LAB — MISC LABCORP TEST (SEND OUT)
LabCorp test name: 733692
Labcorp test code: 733692

## 2018-08-10 ENCOUNTER — Encounter: Payer: Self-pay | Admitting: Family Medicine

## 2018-08-10 NOTE — Assessment & Plan Note (Signed)
Controlled, no change in medication DASH diet and commitment to daily physical activity for a minimum of 30 minutes discussed and encouraged, as a part of hypertension management. The importance of attaining a healthy weight is also discussed.  BP/Weight 08/04/2018 06/23/2018 05/15/2018 03/06/2018 01/23/2018 09/03/1751 0/07/402  Systolic BP 591 368 599 234 144 360 165  Diastolic BP 84 90 86 88 82 82 84  Wt. (Lbs) 228 223.4 221.12 215 212 210 212  BMI 37.94 37.18 43.18 35.78 35.28 34.95 35.28  Some encounter information is confidential and restricted. Go to Review Flowsheets activity to see all data.

## 2018-08-10 NOTE — Assessment & Plan Note (Signed)
Mary Roman is reminded of the importance of commitment to daily physical activity for 30 minutes or more, as able and the need to limit carbohydrate intake to 30 to 60 grams per meal to help with blood sugar control.   The need to take medication as prescribed, test blood sugar as directed, and to call between visits if there is a concern that blood sugar is uncontrolled is also discussed.   Mary Roman is reminded of the importance of daily foot exam, annual eye examination, and good blood sugar, blood pressure and cholesterol control.  Updated lab needed .  Diabetic Labs Latest Ref Rng & Units 03/06/2018 01/24/2018 08/31/2017 01/08/2017 12/12/2016  HbA1c <5.7 % of total Hgb 6.8(H) - 6.7(H) 7.1(H) -  Microalbumin mg/dL 1.7 13.9(H) - - 2.7  Micro/Creat Ratio <30 mcg/mg creat 10 9.0 - - 9  Chol <200 mg/dL 154 - - 157 -  HDL >50 mg/dL 45(L) - - 37(L) -  Calc LDL mg/dL (calc) 96 - - 107(H) -  Triglycerides <150 mg/dL 50 - - 64 -  Creatinine 0.50 - 1.10 mg/dL 0.95 - 0.93 0.88 -   BP/Weight 08/04/2018 06/23/2018 05/15/2018 03/06/2018 01/23/2018 09/03/9561 03/05/5642  Systolic BP 329 518 841 660 630 160 109  Diastolic BP 84 90 86 88 82 82 84  Wt. (Lbs) 228 223.4 221.12 215 212 210 212  BMI 37.94 37.18 43.18 35.78 35.28 34.95 35.28  Some encounter information is confidential and restricted. Go to Review Flowsheets activity to see all data.   Foot/eye exam completion dates Latest Ref Rng & Units 01/23/2018 01/23/2018  Eye Exam No Retinopathy No Retinopathy No Retinopathy  Foot Form Completion - - Done

## 2018-08-10 NOTE — Assessment & Plan Note (Signed)
The patient's Controlled Substance registry is reviewed and compliance confirmed. Adequacy of  Pain control and level of function is assessed. Medication dosing is adjusted as deemed appropriate. Twelve weeks of medication is prescribed , patient signs for the script and is provided with a follow up appointment between 11 to 12 weeks .  

## 2018-08-10 NOTE — Assessment & Plan Note (Signed)
Marked improvement and currently in remission,  Managed by Psychiatry, needs to continue esp wit therapy

## 2018-08-10 NOTE — Assessment & Plan Note (Signed)
Obesity linked with hypertension, diabetes , depression and chronic pain Deteriorated. Patient re-educated about  the importance of commitment to a  minimum of 150 minutes of exercise per week.  The importance of healthy food choices with portion control discussed. Encouraged to start a food diary, count calories and to consider  joining a support group. Sample diet sheets offered. Goals set by the patient for the next several months.   Weight /BMI 08/04/2018 06/23/2018 05/15/2018  WEIGHT 228 lb 223 lb 6.4 oz 221 lb 1.9 oz  HEIGHT 5\' 5"  5\' 5"  5\' 0"   BMI 37.94 kg/m2 37.18 kg/m2 43.18 kg/m2  Some encounter information is confidential and restricted. Go to Review Flowsheets activity to see all data.

## 2018-08-14 ENCOUNTER — Ambulatory Visit: Payer: No Typology Code available for payment source | Admitting: Cardiovascular Disease

## 2018-08-14 ENCOUNTER — Encounter: Payer: Self-pay | Admitting: Cardiovascular Disease

## 2018-08-14 VITALS — BP 99/60 | HR 79 | Ht 65.0 in | Wt 231.6 lb

## 2018-08-14 DIAGNOSIS — I10 Essential (primary) hypertension: Secondary | ICD-10-CM | POA: Diagnosis not present

## 2018-08-14 DIAGNOSIS — R002 Palpitations: Secondary | ICD-10-CM | POA: Diagnosis not present

## 2018-08-14 DIAGNOSIS — R5383 Other fatigue: Secondary | ICD-10-CM

## 2018-08-14 DIAGNOSIS — G4730 Sleep apnea, unspecified: Secondary | ICD-10-CM

## 2018-08-14 NOTE — Patient Instructions (Addendum)
Medication Instructions:  Your physician recommends that you continue on your current medications as directed. Please refer to the Current Medication list given to you today.  Labwork: NONE  Testing/Procedures: Your physician has requested that you have an echocardiogram. Echocardiography is a painless test that uses sound waves to create images of your heart. It provides your doctor with information about the size and shape of your heart and how well your heart's chambers and valves are working. This procedure takes approximately one hour. There are no restrictions for this procedure.  You have been referred to have a Sleep Study    Follow-Up: Your physician recommends that you schedule a follow-up appointment in:4 months    Any Other Special Instructions Will Be Listed Below (If Applicable).     If you need a refill on your cardiac medications before your next appointment, please call your pharmacy.

## 2018-08-14 NOTE — Progress Notes (Addendum)
CARDIOLOGY CONSULT NOTE  Patient ID: LAQUIDA COTRELL MRN: [id number] DOB/AGE: [birth year] 49 y.o.  Admit date: (Not on file) Primary Physician: Fayrene Helper, MD Referring Physician: Fayrene Helper, MD  Reason for Consultation: Palpitations  HPI: Mary Roman is a 48 y.o. female who is being seen today for the evaluation of palpitations at the request of Fayrene Helper, MD.   Past medical history includes type 2 diabetes, hypertension, depression, and chronic pain.  ECG performed in the office today which I ordered and personally interpreted demonstrates normal sinus rhythm with nonspecific T wave abnormalities.  She was evaluated for chest tightness and palpitations several years ago.  She last saw Dr. Lattie Haw on 09/07/2011.  He prescribed Lopressor 25 mg daily and she has been maintained on that for the past several years.  She told me that she had a resurgence of palpitations in September and October 2019.  She also told me that she suffers with anxiety and depression and symptoms regarding this were very significant during that same period of time.  Symptoms have since subsided.  She denies exertional chest pain and shortness of breath.  She denies orthopnea and leg swelling.  She did note that she feels extremely fatigued.  She was instructed by her PCP to start an exercise program.  Upon further discussion, she admits to morning headaches, snoring, and daytime somnolence.  I reviewed labs dated 03/06/2018: Normal TSH, B12, and comprehensive metabolic panel, iron was mildly low at 35.  There was a mild anemia with hemoglobin of 10.9.  A1c was mildly elevated at 6.8%.  Family history: Mother has a history of CVA and atrial fibrillation.  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   No Known Allergies  Current Outpatient Medications  Medication Sig Dispense Refill  . ARIPiprazole (ABILIFY) 5 MG tablet Take  1 tablet (5 mg total) by mouth daily. 30 tablet 1  . buPROPion (WELLBUTRIN XL) 300 MG 24 hr tablet Take 1 tablet (300 mg total) by mouth daily. 90 tablet 0  . Cyanocobalamin (VITAMIN B-12 PO) Take by mouth.    . DULoxetine (CYMBALTA) 60 MG capsule Take 2 capsules (120 mg total) by mouth daily. 180 capsule 0  . HYDROcodone-acetaminophen (NORCO/VICODIN) 5-325 MG tablet Take one tablet once daily for back pain 30 tablet 0  . [START ON 09/11/2018] HYDROcodone-acetaminophen (NORCO/VICODIN) 5-325 MG tablet Take one tablet one time daily for back pain 30 tablet 0  . [START ON 10/11/2018] HYDROcodone-acetaminophen (NORCO/VICODIN) 5-325 MG tablet Take one tablet one time daily for back pain 30 tablet 0  . metoprolol tartrate (LOPRESSOR) 25 MG tablet Take 1 tablet (25 mg total) by mouth daily. 90 tablet 1  . ondansetron (ZOFRAN) 4 MG tablet One tablet twice daily , as needed, for nausea 30 tablet 1  . temazepam (RESTORIL) 30 MG capsule TAKE 1 CAPSULE BY MOUTH ONCE DAILY AT BEDTIME AS NEEDED FOR SLEEP 90 capsule 1  . triamterene-hydrochlorothiazide (MAXZIDE-25) 37.5-25 MG tablet Take 1 tablet by mouth daily. 90 tablet 3  . glipiZIDE (GLUCOTROL XL) 2.5 MG 24 hr tablet Take 1 tablet (2.5 mg total) by mouth daily with breakfast. 90 tablet 1   No current facility-administered medications for this visit.     Past Medical History:  Diagnosis Date  . Anxiety   . Chronic back pain   . Diabetes mellitus, type II ([place])   . Headache(784.0)   . Hypertension   .  Obesity   . Palpitation   . Thyromegaly     Past Surgical History:  Procedure Laterality Date  . None      Social History   Socioeconomic History  . Marital status: Single    Spouse name: Not on file  . Number of children: 2  . Years of education: Not on file  . Highest education level: Not on file  Occupational History  . 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 Financial resource strain: Not on file  . Food insecurity:     Worry: Not on file    Inability: Not on file  . Transportation needs:    Medical: Not on file    Non-medical: Not on file  Tobacco Use  . Smoking status: Never Smoker  . Smokeless tobacco: Never Used  Substance and Sexual Activity  . Alcohol use: No    Comment: 01-17-2017 per pt no  . Drug use: No    Comment: 01-17-2017 per pt no  . Sexual activity: Yes    Partners: Male    Birth control/protection: Other-see comments    Comment: bf had vasectomy  Lifestyle  . Physical activity:    Days per week: Not on file    Minutes per session: Not on file  . Stress: Not on file  Relationships  . Social connections:    Talks on phone: Not on file    Gets together: Not on file    Attends religious service: Not on file    Active member of club or organization: Not on file    Attends meetings of clubs or organizations: Not on file    Relationship status: Not on file  . Intimate partner violence:    Fear of current or ex partner: Not on file    Emotionally abused: Not on file    Physically abused: Not on file    Forced sexual activity: Not on file  Other Topics Concern  . Not on file  Social History Narrative  . Not on file      Current Meds  Medication Sig  . ARIPiprazole (ABILIFY) 5 MG tablet Take 1 tablet (5 mg total) by mouth daily.  Marland Kitchen buPROPion (WELLBUTRIN XL) 300 MG 24 hr tablet Take 1 tablet (300 mg total) by mouth daily.  . Cyanocobalamin (VITAMIN B-12 PO) Take by mouth.  . DULoxetine (CYMBALTA) 60 MG capsule Take 2 capsules (120 mg total) by mouth daily.  Marland Kitchen HYDROcodone-acetaminophen (NORCO/VICODIN) 5-325 MG tablet Take one tablet once daily for back pain  . [START ON 09/11/2018] HYDROcodone-acetaminophen (NORCO/VICODIN) 5-325 MG tablet Take one tablet one time daily for back pain  . [START ON 10/11/2018] HYDROcodone-acetaminophen (NORCO/VICODIN) 5-325 MG tablet Take one tablet one time daily for back pain  . metoprolol tartrate (LOPRESSOR) 25 MG tablet Take 1 tablet (25 mg  total) by mouth daily.  . ondansetron (ZOFRAN) 4 MG tablet One tablet twice daily , as needed, for nausea  . temazepam (RESTORIL) 30 MG capsule TAKE 1 CAPSULE BY MOUTH ONCE DAILY AT BEDTIME AS NEEDED FOR SLEEP  . triamterene-hydrochlorothiazide (MAXZIDE-25) 37.5-25 MG tablet Take 1 tablet by mouth daily.      Review of systems complete and found to be negative unless listed above in HPI  A nurse/CMA was present throughout the physical exam.   Physical exam Blood pressure 99/60, pulse 79, height 5\' 5"  (1.651 m), weight 231 lb 9.6 oz (105.1 kg). General: NAD Neck: No JVD, no thyromegaly or thyroid  nodule.  Lungs: Clear to auscultation bilaterally with normal respiratory effort. CV: Nondisplaced PMI. Regular rate and rhythm, normal S1/S2, no S3/S4, no murmur.  No peripheral edema.  No carotid bruit.    Abdomen: Soft, nontender, no distention.  Skin: Intact without lesions or rashes.  Neurologic: Alert and oriented x 3.  Psych: Normal affect. Extremities: No clubbing or cyanosis.  HEENT: Normal.   ECG: Most recent ECG reviewed.   Labs: Lab Results  Component Value Date/Time   K 3.6 03/06/2018 09:28 AM   BUN 14 03/06/2018 09:28 AM   CREATININE 0.95 03/06/2018 09:28 AM   ALT 10 03/06/2018 09:28 AM   TSH 1.23 03/06/2018 09:28 AM   HGB 10.9 (L) 03/06/2018 09:28 AM     Lipids: Lab Results  Component Value Date/Time   LDLCALC 96 03/06/2018 09:28 AM   CHOL 154 03/06/2018 09:28 AM   TRIG 50 03/06/2018 09:28 AM   HDL 45 (L) 03/06/2018 09:28 AM        ASSESSMENT AND PLAN:  1.  Palpitations: Currently on Lopressor 25 mg daily as prescribed by cardiology several years ago.  Symptoms were exacerbated in September and October 2019 at the time she was suffering with significant anxiety and depression and symptoms have since subsided.  Her mother has a history of atrial fibrillation.  The patient appears to have sleep disordered breathing and may have sleep apnea. I will order a 2-D  echocardiogram with Doppler to evaluate cardiac structure, function, and regional wall motion. I will also arrange for a referral to a sleep specialist for a sleep study.  2.  Hypertension: Blood pressure is low normal.  3.  Fatigue/sleep disordered breathing: She complains of significant fatigue and admits to morning headaches, snoring, and daytime somnolence.  She is at high risk for sleep apnea given her obesity and symptom complex.  I will make a referral to a sleep specialist for a sleep study.   Disposition: Follow up in 4 months  Signed: Kate Sable, M.D., F.A.C.C.  08/14/2018, 8:48 AM

## 2018-08-21 ENCOUNTER — Ambulatory Visit (HOSPITAL_COMMUNITY)
Admission: RE | Admit: 2018-08-21 | Discharge: 2018-08-21 | Disposition: A | Discharge status: Home / Self Care | Payer: No Typology Code available for payment source | Source: Ambulatory Visit | Attending: Cardiovascular Disease | Admitting: Cardiovascular Disease

## 2018-08-21 ENCOUNTER — Other Ambulatory Visit: Payer: Self-pay

## 2018-08-21 DIAGNOSIS — R002 Palpitations: Secondary | ICD-10-CM | POA: Insufficient documentation

## 2018-08-21 MED ORDER — TEMAZEPAM 30 MG PO CAPS: ORAL_CAPSULE | ORAL | 1 refills | Status: DC

## 2018-08-21 MED FILL — TEMAZEPAM 30 MG CAPSULE: 30 | 90 days supply | Qty: 90 | Fill #0

## 2018-08-21 NOTE — Progress Notes (Signed)
*  PRELIMINARY RESULTS* Echocardiogram 2D Echocardiogram has been performed by Gerald Stabs, RDCS.  Mary Roman 08/21/2018, 11:12 AM

## 2018-09-01 ENCOUNTER — Other Ambulatory Visit (HOSPITAL_COMMUNITY): Payer: Self-pay | Admitting: Psychiatry

## 2018-09-01 ENCOUNTER — Telehealth (HOSPITAL_COMMUNITY): Payer: Self-pay | Admitting: *Deleted

## 2018-09-01 MED ORDER — DULOXETINE HCL 60 MG PO CPEP: 120.0000 mg | ORAL_CAPSULE | Freq: Every day | ORAL | 0 refills | Status: DC

## 2018-09-01 MED FILL — DULoxetine HCL 60 MG CPEP: 60 | 90 days supply | Qty: 180 | Fill #0

## 2018-09-01 NOTE — Telephone Encounter (Signed)
Dr Modesta Messing [facility] called stating the last script for Cymbalta was in 04/2018. Never received one for 07/10/2018. And denial request  was received. Requested new script for Cymbalta 60 mg.

## 2018-09-01 NOTE — Telephone Encounter (Signed)
Ordered. Please make sure that she has a follow up appointment.

## 2018-09-08 ENCOUNTER — Telehealth: Payer: Self-pay

## 2018-09-08 NOTE — Telephone Encounter (Signed)
Mary Roman wants to know if she can try phentermine again because even with the decrease in sugar she is still struggling. Please advise.  [facility]

## 2018-09-09 NOTE — Telephone Encounter (Signed)
Pt aware.

## 2018-09-09 NOTE — Telephone Encounter (Signed)
I recommend complketing her cardiology evaluation , she c/o fatigue and p[alapitations, needed sleep[ study before , still has not had this yet. Wait until heart is cleared. Encourage her to focus on changing food choice , thios will facilitate weight loss

## 2018-09-09 NOTE — Progress Notes (Deleted)
[facility] MD/PA/NP OP Progress Note  09/09/2018 2:04 PM Mary Roman  MRN:  [id number]  Chief Complaint:  HPI: - she is referred for sleep study Visit Diagnosis: No diagnosis found.  Past Psychiatric History: Please see initial evaluation for full details. I have reviewed the history. No updates at this time.     Past Medical History:  Past Medical History:  Diagnosis Date  . Anxiety   . Chronic back pain   . Diabetes mellitus, type II ([place])   . Headache(784.0)   . Hypertension   . Obesity   . Palpitation   . Thyromegaly     Past Surgical History:  Procedure Laterality Date  . None      Family Psychiatric History: Please see initial evaluation for full details. I have reviewed the history. No updates at this time.     Family History:  Family History  Problem Relation Age of Onset  . Diabetes Mother   . Hypertension Mother   . Stroke Mother   . Colon cancer Mother   . Alcohol abuse Mother   . Cancer - Colon Mother 85  . Pneumonia Father   . Hypertension Sister   . Anxiety disorder Sister   . Hypertension Sister   . Leukemia Sister   . Diabetes Maternal Grandmother   . Hypertension Maternal Grandmother   . Hypertension Maternal Grandfather   . Heart attack Maternal Uncle     Social History:  Social History   Socioeconomic History  . Marital status: Single    Spouse name: Not on file  . Number of children: 2  . Years of education: Not on file  . Highest education level: Not on file  Occupational History  . 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 Financial resource strain: Not on file  . Food insecurity:    Worry: Not on file    Inability: Not on file  . Transportation needs:    Medical: Not on file    Non-medical: Not on file  Tobacco Use  . Smoking status: Never Smoker  . Smokeless tobacco: Never Used  Substance and Sexual Activity  . Alcohol use: No    Comment: 01-17-2017 per pt no  . Drug use: No    Comment: 01-17-2017 per  pt no  . Sexual activity: Yes    Partners: Male    Birth control/protection: Other-see comments    Comment: bf had vasectomy  Lifestyle  . Physical activity:    Days per week: Not on file    Minutes per session: Not on file  . Stress: Not on file  Relationships  . Social connections:    Talks on phone: Not on file    Gets together: Not on file    Attends religious service: Not on file    Active member of club or organization: Not on file    Attends meetings of clubs or organizations: Not on file    Relationship status: Not on file  Other Topics Concern  . Not on file  Social History Narrative  . Not on file    Allergies: No Known Allergies  Metabolic Disorder Labs: Lab Results  Component Value Date   HGBA1C 6.8 (H) 03/06/2018   MPG 148 03/06/2018   MPG 146 08/31/2017   No results found for: PROLACTIN Lab Results  Component Value Date   CHOL 154 03/06/2018   TRIG 50 03/06/2018   HDL 45 (L) 03/06/2018   CHOLHDL 3.4 03/06/2018  VLDL 13 01/08/2017   LDLCALC 96 03/06/2018   LDLCALC 107 (H) 01/08/2017   Lab Results  Component Value Date   TSH 1.23 03/06/2018   TSH 0.95 01/08/2017    Therapeutic Level Labs: No results found for: LITHIUM No results found for: VALPROATE No components found for:  CBMZ  Current Medications: Current Outpatient Medications  Medication Sig Dispense Refill  . ARIPiprazole (ABILIFY) 5 MG tablet Take 1 tablet (5 mg total) by mouth daily. 30 tablet 1  . buPROPion (WELLBUTRIN XL) 300 MG 24 hr tablet Take 1 tablet (300 mg total) by mouth daily. 90 tablet 0  . Cyanocobalamin (VITAMIN B-12 PO) Take by mouth.    . DULoxetine (CYMBALTA) 60 MG capsule Take 2 capsules (120 mg total) by mouth daily. 180 capsule 0  . DULoxetine (CYMBALTA) 60 MG capsule Take 2 capsules (120 mg total) by mouth daily. 180 capsule 0  . glipiZIDE (GLUCOTROL XL) 2.5 MG 24 hr tablet Take 1 tablet (2.5 mg total) by mouth daily with breakfast. 90 tablet 1  .  HYDROcodone-acetaminophen (NORCO/VICODIN) 5-325 MG tablet Take one tablet once daily for back pain 30 tablet 0  . [START ON 09/11/2018] HYDROcodone-acetaminophen (NORCO/VICODIN) 5-325 MG tablet Take one tablet one time daily for back pain 30 tablet 0  . [START ON 10/11/2018] HYDROcodone-acetaminophen (NORCO/VICODIN) 5-325 MG tablet Take one tablet one time daily for back pain 30 tablet 0  . metoprolol tartrate (LOPRESSOR) 25 MG tablet Take 1 tablet (25 mg total) by mouth daily. 90 tablet 1  . ondansetron (ZOFRAN) 4 MG tablet One tablet twice daily , as needed, for nausea 30 tablet 1  . temazepam (RESTORIL) 30 MG capsule TAKE 1 CAPSULE BY MOUTH ONCE DAILY AT BEDTIME AS NEEDED FOR SLEEP 90 capsule 1  . triamterene-hydrochlorothiazide (MAXZIDE-25) 37.5-25 MG tablet Take 1 tablet by mouth daily. 90 tablet 3   No current facility-administered medications for this visit.      Musculoskeletal: Strength & Muscle Tone: within normal limits Gait & Station: normal Patient leans: N/A  Psychiatric Specialty Exam: ROS  There were no vitals taken for this visit.There is no height or weight on file to calculate BMI.  General Appearance: Fairly Groomed  Eye Contact:  Good  Speech:  Clear and Coherent  Volume:  Normal  Mood:  {BHH MOOD:22306}  Affect:  {Affect (PAA):22687}  Thought Process:  Coherent  Orientation:  Full (Time, Place, and Person)  Thought Content: Logical   Suicidal Thoughts:  {ST/HT (PAA):22692}  Homicidal Thoughts:  {ST/HT (PAA):22692}  Memory:  Immediate;   Good  Judgement:  {Judgement (PAA):22694}  Insight:  {Insight (PAA):22695}  Psychomotor Activity:  Normal  Concentration:  Concentration: Good and Attention Span: Good  Recall:  Good  Fund of Knowledge: Good  Language: Good  Akathisia:  No  Handed:  Right  AIMS (if indicated):   Assets:  Communication Skills Desire for Improvement  ADL's:  Intact  Cognition: WNL  Sleep:  {BHH GOOD/FAIR/POOR:22877}    Screenings: GAD-7     Counselor from 05/15/2018 in [facility] Office Visit from 01/08/2017 in [facility]  Total GAD-7 Score  17  14    PHQ2-9     Office Visit from 08/04/2018 in [facility] Office Visit from 05/15/2018 in [facility] Office Visit from 03/06/2018 in [facility] Office Visit from 01/23/2018 in [facility] Office Visit from 12/05/2017 in [facility]  PHQ-2 Total Score  2  2  6  4  6   PHQ-9 Total Score  5  10  24  19  22        Assessment and Plan:  Mary Roman is a 48 y.o. year old female with a history of depression, diabetes, who presents for follow up appointment for No diagnosis found.  # MDD, moderate, recurrent without psychotic features  Patient continues to have depressive symptoms since the last visit.  We will do up titration of Abilify as adjunctive treatment for depression.  Discussed potential metabolic side effect.  She is advised to take it at night if any worsening in nausea or drowsiness.  Will continue duloxetine to target depression.  Will continue Wellbutrin as adjunctive treatment for depression.  She has no known history of seizure.  Discussed behavioral activation, self compassion.   Plan  1.Continueduloxetine 120 mg daily  2.ContinueWellbutrin 300mg  daily 3.IncreaseAbilify 5 mg daily 4.Return to clinic in one month for15 mins 5. (Continue temazepam 30 mg at night as needed for sleep) - She will continue to see Ms Bynyum for therapy (She is on gabapentin for pain and hydroxyzine)  The patient demonstrates the following risk factors for suicide: Chronic risk factors for suicide include: psychiatric disorder of depression, anxietyand chronic pain. Acute risk factorsfor suicide include: N/A. Protective factorsfor this patient include: positive social support, responsibility to others (children, family), coping skills  and hope for the future. Considering these factors, the overall suicide risk at this point appears to be low. Patient isappropriate for outpatient follow up.  [name], MD 09/09/2018, 2:04 PM

## 2018-09-16 ENCOUNTER — Ambulatory Visit (HOSPITAL_COMMUNITY): Payer: No Typology Code available for payment source | Admitting: Psychiatry

## 2018-09-22 ENCOUNTER — Encounter (HOSPITAL_COMMUNITY): Payer: Self-pay | Admitting: Psychiatry

## 2018-09-22 ENCOUNTER — Ambulatory Visit (INDEPENDENT_AMBULATORY_CARE_PROVIDER_SITE_OTHER): Payer: No Typology Code available for payment source | Admitting: Psychiatry

## 2018-09-22 DIAGNOSIS — F331 Major depressive disorder, recurrent, moderate: Secondary | ICD-10-CM | POA: Diagnosis not present

## 2018-09-22 NOTE — Progress Notes (Signed)
BH MD/PA/NP OP Progress Note  09/25/2018 8:28 AM Mary Roman  MRN:  [id number]  Chief Complaint:  Chief Complaint    Follow-up; Depression     HPI:  Patient presents for follow-up appointment for depression.  She states that she feels very tired in the morning, and feels anxious throughout the day.  It has been difficult for the patient to concentrate at work.  She has been stressed at work, although she managed to go there except the days she was out due to flu.  She reports fair relationship with her fianc, although she is concerned about his alcohol use; he was admitted a few days in the hospital due to some issues with pancreas.  She stopped taking a walk due to fatigue.  She has middle insomnia, although she takes temazepam.  She occasionally has been cheating.  She denies SI.  She denies panic attacks.    Temazepam 30 mg, 90 tabs, on 1/23  Wt Readings from Last 3 Encounters:  09/25/18 233 lb (105.7 kg)  08/14/18 231 lb 9.6 oz (105.1 kg)  08/04/18 228 lb (103.4 kg)    Visit Diagnosis:    ICD-10-CM   1. Major depressive disorder, recurrent episode, moderate ([place]) F33.1     Past Psychiatric History: Please see initial evaluation for full details. I have reviewed the history. No updates at this time.     Past Medical History:  Past Medical History:  Diagnosis Date  . Anxiety   . Chronic back pain   . Diabetes mellitus, type II ([place])   . Headache(784.0)   . Hypertension   . Obesity   . Palpitation   . Thyromegaly     Past Surgical History:  Procedure Laterality Date  . None      Family Psychiatric History: Please see initial evaluation for full details. I have reviewed the history. No updates at this time.     Family History:  Family History  Problem Relation Age of Onset  . Diabetes Mother   . Hypertension Mother   . Stroke Mother   . Colon cancer Mother   . Alcohol abuse Mother   . Cancer - Colon Mother 30  . Pneumonia Father   . Hypertension  Sister   . Anxiety disorder Sister   . Hypertension Sister   . Leukemia Sister   . Diabetes Maternal Grandmother   . Hypertension Maternal Grandmother   . Hypertension Maternal Grandfather   . Heart attack Maternal Uncle     Social History:  Social History   Socioeconomic History  . Marital status: Single    Spouse name: Not on file  . Number of children: 2  . Years of education: Not on file  . Highest education level: Not on file  Occupational History  . 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 Financial resource strain: Not on file  . Food insecurity:    Worry: Not on file    Inability: Not on file  . Transportation needs:    Medical: Not on file    Non-medical: Not on file  Tobacco Use  . Smoking status: Never Smoker  . Smokeless tobacco: Never Used  Substance and Sexual Activity  . Alcohol use: No    Comment: 01-17-2017 per pt no  . Drug use: No    Comment: 01-17-2017 per pt no  . Sexual activity: Yes    Partners: Male    Birth control/protection: Other-see comments    Comment: bf  had vasectomy  Lifestyle  . Physical activity:    Days per week: Not on file    Minutes per session: Not on file  . Stress: Not on file  Relationships  . Social connections:    Talks on phone: Not on file    Gets together: Not on file    Attends religious service: Not on file    Active member of club or organization: Not on file    Attends meetings of clubs or organizations: Not on file    Relationship status: Not on file  Other Topics Concern  . Not on file  Social History Narrative  . Not on file    Allergies: No Known Allergies  Metabolic Disorder Labs: Lab Results  Component Value Date   HGBA1C 6.8 (H) 03/06/2018   MPG 148 03/06/2018   MPG 146 08/31/2017   No results found for: PROLACTIN Lab Results  Component Value Date   CHOL 154 03/06/2018   TRIG 50 03/06/2018   HDL 45 (L) 03/06/2018   CHOLHDL 3.4 03/06/2018   VLDL 13 01/08/2017   LDLCALC  96 03/06/2018   LDLCALC 107 (H) 01/08/2017   Lab Results  Component Value Date   TSH 1.23 03/06/2018   TSH 0.95 01/08/2017    Therapeutic Level Labs: No results found for: LITHIUM No results found for: VALPROATE No components found for:  CBMZ  Current Medications: Current Outpatient Medications  Medication Sig Dispense Refill  . ARIPiprazole (ABILIFY) 5 MG tablet Take 1 tablet (5 mg total) by mouth daily. 90 tablet 0  . [START ON 10/07/2018] buPROPion (WELLBUTRIN XL) 300 MG 24 hr tablet Take 1 tablet (300 mg total) by mouth daily. 90 tablet 0  . Cyanocobalamin (VITAMIN B-12 PO) Take by mouth.    Marland Kitchen HYDROcodone-acetaminophen (NORCO/VICODIN) 5-325 MG tablet Take one tablet one time daily for back pain 30 tablet 0  . [START ON 10/11/2018] HYDROcodone-acetaminophen (NORCO/VICODIN) 5-325 MG tablet Take one tablet one time daily for back pain 30 tablet 0  . metoprolol tartrate (LOPRESSOR) 25 MG tablet Take 1 tablet (25 mg total) by mouth daily. 90 tablet 1  . ondansetron (ZOFRAN) 4 MG tablet One tablet twice daily , as needed, for nausea 30 tablet 1  . temazepam (RESTORIL) 30 MG capsule TAKE 1 CAPSULE BY MOUTH ONCE DAILY AT BEDTIME AS NEEDED FOR SLEEP 90 capsule 1  . triamterene-hydrochlorothiazide (MAXZIDE-25) 37.5-25 MG tablet Take 1 tablet by mouth daily. 90 tablet 3  . glipiZIDE (GLUCOTROL XL) 2.5 MG 24 hr tablet Take 1 tablet (2.5 mg total) by mouth daily with breakfast. 90 tablet 1  . venlafaxine XR (EFFEXOR-XR) 37.5 MG 24 hr capsule 37.5 mg daily for one week 7 capsule 0  . venlafaxine XR (EFFEXOR-XR) 75 MG 24 hr capsule 75 mg daily for two weeks, then 150 mg daily. (Start after completing 37.5 mg daily for one week) 60 capsule 0   No current facility-administered medications for this visit.      Musculoskeletal: Strength & Muscle Tone: within normal limits Gait & Station: normal Patient leans: N/A  Psychiatric Specialty Exam: Review of Systems  Psychiatric/Behavioral: Positive  for depression. Negative for hallucinations, memory loss, substance abuse and suicidal ideas. The patient is nervous/anxious and has insomnia.   All other systems reviewed and are negative.   Blood pressure 123/84, pulse 74, height 5\' 5"  (1.651 m), weight 233 lb (105.7 kg), SpO2 98 %.Body mass index is 38.77 kg/m.  General Appearance: Fairly Groomed  Eye Contact:  Good  Speech:  Clear and Coherent  Volume:  Normal  Mood:  Depressed  Affect:  Appropriate, Congruent and Restricted  Thought Process:  Coherent  Orientation:  Full (Time, Place, and Person)  Thought Content: Logical   Suicidal Thoughts:  No  Homicidal Thoughts:  No  Memory:  Immediate;   Good  Judgement:  Good  Insight:  Fair  Psychomotor Activity:  Normal  Concentration:  Concentration: Good and Attention Span: Good  Recall:  Good  Fund of Knowledge: Good  Language: Good  Akathisia:  No  Handed:  Right  AIMS (if indicated): not done  Assets:  Communication Skills Desire for Improvement  ADL's:  Intact  Cognition: WNL  Sleep:  Poor   Screenings: GAD-7     Counselor from 05/15/2018 in [facility] Office Visit from 01/08/2017 in [facility]  Total GAD-7 Score  17  14    PHQ2-9     Office Visit from 08/04/2018 in [facility] Office Visit from 05/15/2018 in [facility] Office Visit from 03/06/2018 in [facility] Office Visit from 01/23/2018 in [facility] Office Visit from 12/05/2017 in [facility]  PHQ-2 Total Score  2  2  6  4  6   PHQ-9 Total Score  5  10  24  19  22        Assessment and Plan:  Mary Roman is a 48 y.o. year old female with a history of depression, diabetes , who presents for follow up appointment for Major depressive disorder, recurrent episode, moderate ([place])  # MDD, moderate, recurrent without psychotic features Patient continues to report depressive symptoms and anxiety,  although there is slight improvement since up titration of Abilify.  Will cross taper from duloxetine to venlafaxine to target depression and anxiety.  Discussed potential side effect of headache and hypertension.  Will continue bupropion as adjunctive treatment for depression.  She has no known history of seizure.  Will continue Abilify as adjunctive treatment for depression.  Discussed potential metabolic side effect.  Discussed behavioral activation.   Plan I have reviewed and updated plans as below 1. Decrease duloxetine 60 mg daily for one week, then discontinue  2. Start venlafaxine 37.5 mg daily for one week, then 75 mg daily for two weeks, then 150 mg daily  3. Continue bupropion 300 mg daily (side effect from higher dose) 4. Continue Abilify 5 mg daily  5. Return to clinic in two months for 15 mins -  temazepam 30 mg at night as needed for sleep - on gabapentin for pain and hydroxyzine  Past trials of medication: fluoxetine, buspirone, Trazodone ("weird" feeling) temazepam, Xanax  The patient demonstrates the following risk factors for suicide: Chronic risk factors for suicide include: psychiatric disorder of depression, anxietyand chronic pain. Acute risk factorsfor suicide include: N/A. Protective factorsfor this patient include: positive social support, responsibility to others (children, family), coping skills and hope for the future. Considering these factors, the overall suicide risk at this point appears to be low. Patient isappropriate for outpatient follow up.  [name], MD 09/25/2018, 8:28 AM

## 2018-09-22 NOTE — Progress Notes (Signed)
   THERAPIST PROGRESS NOTE  Session Time: Monday 09/22/2018 9:10 AM - 9:51 AM   Participation Level: Active  Behavioral Response: CasualAlertAnxious  Type of Therapy: Individual Therapy  Treatment Goals addressed:  Learn and implement calming strategies to reduce/manage overall anxiety.                                                             Learn and implement behavioral strategies to overcome depression.  Interventions: CBT and Supportive  Summary: Mary Roman is a 48 y.o. female who presents with a history of symptoms of depression that initially began seveal years ago after her mother had a stroke. Symptoms have waxed and waned since that time. She and one of her siblings assumed most of the caretaker responsibilities for her mother. Symptoms worsened as her sibling who had helped her with mother moved to [place] and no longer is as available to assist patient with their mother's care. Patient also has multiple other stressors. Her symptoms include low energy depressed mood, poor concentration, feelings of hopelessness, irritability, changes in appetite, sleep difficulty, feelings of worthlessness, tearfulness, anxiety, muscle tension, excessive worrying.  Patient last was seen in December 2019. She reports being less depressed but being more anxious. Her coworker gave notice of her resignation 2 weeks ago and patient reports this triggered memories of being overwhelmed last year. She worked 3 months without coworker or staff replacement and had realistic job demands per her report. She has applied for another position and has been informed unofficially she has the position. However, patient is experiencing anxiety about accepting the position as it is a change and she reports fear of change. Patient reports negative thoughts of " what" if regarding outcome of accepting position. She reports sleep difficulty and excessive worry.    Suicidal/Homicidal: Nowithout  intent/plan  Therapist Response: Discussed stressors, facilitated expression of thoughts and feelings,validated feelings, assisted patient assess pros and cons of staying in current position and accepting new position, reviewed connection between thoughts, mood, and behavior using examples from patient's current situation,  discussed living with uncertainty, assisted patient identify/challenge/and replace unhelpful thoughts with helpful thoughts with problem solving by assisting  Plan: Return again in 2 weeks.   Diagnosis: Axis I: MDD    Axis II: Deferred    [name], LCSW 09/22/2018

## 2018-09-24 MED FILL — ARIPiprazole 5 MG TABS: 5 | 30 days supply | Qty: 30 | Fill #0

## 2018-09-25 ENCOUNTER — Encounter (HOSPITAL_COMMUNITY): Payer: Self-pay | Admitting: Psychiatry

## 2018-09-25 ENCOUNTER — Institutional Professional Consult (permissible substitution): Payer: Self-pay | Admitting: Neurology

## 2018-09-25 ENCOUNTER — Ambulatory Visit (INDEPENDENT_AMBULATORY_CARE_PROVIDER_SITE_OTHER): Payer: No Typology Code available for payment source | Admitting: Psychiatry

## 2018-09-25 VITALS — BP 123/84 | HR 74 | Ht 65.0 in | Wt 233.0 lb

## 2018-09-25 DIAGNOSIS — F331 Major depressive disorder, recurrent, moderate: Secondary | ICD-10-CM | POA: Diagnosis not present

## 2018-09-25 MED ORDER — BUPROPION HCL ER (XL) 300 MG PO TB24: 300.0000 mg | ORAL_TABLET | Freq: Every day | ORAL | 0 refills | Status: DC

## 2018-09-25 MED ORDER — VENLAFAXINE HCL ER 75 MG PO CP24: ORAL_CAPSULE | ORAL | 0 refills | Status: DC

## 2018-09-25 MED ORDER — VENLAFAXINE HCL ER 37.5 MG PO CP24: ORAL_CAPSULE | ORAL | 0 refills | Status: DC

## 2018-09-25 MED ORDER — ARIPIPRAZOLE 5 MG PO TABS: 5.0000 mg | ORAL_TABLET | Freq: Every day | ORAL | 0 refills | Status: DC

## 2018-09-25 MED FILL — VENLAFAXINE HCL ER 75 MG CA: 75 | 30 days supply | Qty: 60 | Fill #0

## 2018-09-25 MED FILL — VENLAFAXINE HCL ER 37.5 MG: 37.5 | 7 days supply | Qty: 7 | Fill #0

## 2018-09-25 NOTE — Patient Instructions (Signed)
1. Decrease duloxetine 60 mg daily for one week, then discontinue  2. Start venlafaxine 37.5 mg daily for one week, then 75 mg daily for two weeks, then 150 mg daily  3. Continue bupropion 300 mg daily (side effect from higher dose) 4. Continue Abilify 5 mg daily  5. Return to clinic in two months for 15 mins

## 2018-10-06 ENCOUNTER — Ambulatory Visit (INDEPENDENT_AMBULATORY_CARE_PROVIDER_SITE_OTHER): Payer: No Typology Code available for payment source | Admitting: Psychiatry

## 2018-10-06 DIAGNOSIS — F331 Major depressive disorder, recurrent, moderate: Secondary | ICD-10-CM

## 2018-10-06 NOTE — Progress Notes (Signed)
   THERAPIST PROGRESS NOTE  Session Time: Monday 10/06/2018 8:12 AM - 9:05 AM   Participation Level: Active  Behavioral Response: CasualAlertAnxious  Type of Therapy: Individual Therapy  Treatment Goals addressed:  Learn and implement calming strategies to reduce/manage overall anxiety.                                                             Learn and implement behavioral strategies to overcome depression.  Interventions: CBT and Supportive  Summary: Mary Roman is a 48 y.o. female who presents with a history of symptoms of depression that initially began seveal years ago after her mother had a stroke. Symptoms have waxed and waned since that time. She and one of her siblings assumed most of the caretaker responsibilities for her mother. Symptoms worsened as her sibling who had helped her with mother moved to [place] and no longer is as available to assist patient with their mother's care. Patient also has multiple other stressors. Her symptoms include low energy depressed mood, poor concentration, feelings of hopelessness, irritability, changes in appetite, sleep difficulty, feelings of worthlessness, tearfulness, anxiety, muscle tension, excessive worrying.  Patient last was seen about two weeks ago. She reports increased depressed mood, diminished interest/involvement in activities, increased sleep difficulty, and social withdrawal.  She expresses frustration and anger boyfriend resumed pattern of drinking alcohol excessively resulting being hospitalized again. She expresses fear of him dying and thoughts of children being fatherless and having a hard time like she did when her father died. She reports additional stress related to trying to work out her two week notice and train someone for current position as patient begins her new position on October 27, 2018. She reports falling behind at work and not taking breaks. She reports doing little in the evenings after work and disengaging  from family.   Suicidal/Homicidal: Nowithout intent/plan  Therapist Response: Discussed stressors, facilitated expression of thoughts and feelings,validated feelings, assisted patient identify realistic expectations of boyfriend, discussed triggers of patient's lapse, did behavioral analysis regarding patient's problematic behaviors (social withdrawal, poor self-care), used cognitive defusion to help patient cope with ruminating thoughts, assisted patient develop plan to improve self-care (resume taking breaks at work) assisted patient identify activities consistent with her values and developed plan to increase behavioral activation ( work on home project with daughter one hour per evening, plan and do outing with daughter/sister during the weekend)  Plan: Return again in 2 weeks.   Diagnosis: Axis I: MDD    Axis II: Deferred    [name], LCSW 10/06/2018

## 2018-10-15 ENCOUNTER — Ambulatory Visit (HOSPITAL_COMMUNITY)
Admission: RE | Admit: 2018-10-15 | Payer: No Typology Code available for payment source | Source: Home / Self Care | Admitting: Internal Medicine

## 2018-10-15 ENCOUNTER — Encounter (HOSPITAL_COMMUNITY): Admission: RE | Payer: Self-pay | Source: Home / Self Care

## 2018-10-15 SURGERY — COLONOSCOPY
Anesthesia: Moderate Sedation

## 2018-10-20 ENCOUNTER — Other Ambulatory Visit: Payer: Self-pay

## 2018-10-20 ENCOUNTER — Ambulatory Visit (INDEPENDENT_AMBULATORY_CARE_PROVIDER_SITE_OTHER): Payer: No Typology Code available for payment source | Admitting: Psychiatry

## 2018-10-20 DIAGNOSIS — F329 Major depressive disorder, single episode, unspecified: Secondary | ICD-10-CM | POA: Diagnosis not present

## 2018-10-20 DIAGNOSIS — F332 Major depressive disorder, recurrent severe without psychotic features: Secondary | ICD-10-CM

## 2018-10-20 NOTE — Progress Notes (Signed)
   THERAPIST PROGRESS NOTE  Session Time: Monday 10/20/2018 8:12 AM -  9:05 AM   Participation Level: Active  Behavioral Response: CasualAlert/Depressed/Anxious/Tearful   Type of Therapy: Individual Therapy  Treatment Goals addressed:  Learn and implement calming strategies to reduce/manage overall anxiety.                                                             Learn and implement behavioral strategies to overcome depression.  Interventions: CBT and Supportive  Summary: Mary Roman is a 48 y.o. female who presents with a history of symptoms of depression that initially began seveal years ago after her mother had a stroke. Symptoms have waxed and waned since that time. She and one of her siblings assumed most of the caretaker responsibilities for her mother. Symptoms worsened as her sibling who had helped her with mother moved to [place] and no longer is as available to assist patient with their mother's care. Patient also has multiple other stressors. Her symptoms include low energy depressed mood, poor concentration, feelings of hopelessness, irritability, changes in appetite, sleep difficulty, feelings of worthlessness, tearfulness, anxiety, muscle tension, excessive worrying.  Patient last was seen about two weeks ago. She reports increased depressed mood, diminished interest/involvement in activities, increased sleep difficulty, and social withdrawal. She states staying in bed all of last weekend. She reports boyfriend lost his job due to poor attendance. She reports anxiety and worry about finances.  She expresses frustration and anger with boyfriend. Patient has continued to work but reports little involvement in any other activities and states just going through the motions.  She reports having fleeting suicidal ideations last week but denies any current SI or plans or intent to harm self.  Suicidal/Homicidal: Nowithout intent/plan, Patient agrees to call this practice, call  911, or have someone take her to the ER should symptoms worsen.   Therapist Response: praised and reinforced patient's attendance in therapy, reviewed symptoms, discussed stressors, facilitated expression of thoughts and feelings, validated feelings, assisted patient develop safety plan (identifying and verablizing reasons for living, identifying and engaging in calming /comforting activities, identifying supportive individuals she can turn to in times of distress) should suicidal thoughts and feelings emerge, reviewed the role of behavioral activation/self-care regarding overcoming depression, assisted patient develop plan to increase behavioral activation/self -care ( walk 15 minutes per day on lunch during the week and walk 15 -20 minutes per day on weekends, avoid skipping meals/eat healthy), also discussed scheduling earlier appointment with psychiatrist Dr. Modesta Messing for medication management and patient agrees to schedule.    Plan: Return again in 2 weeks.   Diagnosis: Axis I: MDD    Axis II: Deferred    [name], LCSW 10/20/2018

## 2018-10-27 ENCOUNTER — Institutional Professional Consult (permissible substitution): Payer: Self-pay | Admitting: Neurology

## 2018-10-30 MED FILL — ARIPiprazole 5 MG TABS: 5 | 90 days supply | Qty: 90 | Fill #0

## 2018-11-04 NOTE — Progress Notes (Signed)
Virtual Visit via Video Note  I connected with Mary Roman on 11/11/18 at  8:00 AM EDT by a video enabled telemedicine application and verified that I am speaking with the correct person using two identifiers.   I discussed the limitations of evaluation and management by telemedicine and the availability of in person appointments. The patient expressed understanding and agreed to proceed.    I discussed the assessment and treatment plan with the patient. The patient was provided an opportunity to ask questions and all were answered. The patient agreed with the plan and demonstrated an understanding of the instructions.   The patient was advised to call back or seek an in-person evaluation if the symptoms worsen or if the condition fails to improve as anticipated.  I provided 15 minutes of non-face-to-face time during this encounter.   [name], MD  [facility] MD/PA/NP OP Progress Note  11/11/2018 8:29 AM Mary Roman  MRN:  [id number]     Chief Complaint:  Chief Complaint    Follow-up; Depression     HPI:  This is a follow-up visit for depression.  She states that she has not been doing well.  Her fianc lost his job.  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  She believes things has been going well.  She has been feeling very anxious and feels like a "rubber band." She always feels tense, and it is difficult for her to relax.  She also struggles with insomnia.  She feels fatigue.  She feels depressed.  She has fair concentration.  She denies SI.  She denies irritability.  She had one panic attack since the last visit.  She denies any side effect after switching from duloxetine to venlafaxine.    Visit Diagnosis:    ICD-10-CM   1. Major depressive disorder, recurrent episode, moderate ([place]) F33.1     Past Psychiatric History: Please see initial evaluation for full details. I have reviewed the history. No updates at this time.     Past Medical  History:  Past Medical History:  Diagnosis Date  . Anxiety   . Chronic back pain   . Diabetes mellitus, type II ([place])   . Headache(784.0)   . Hypertension   . Obesity   . Palpitation   . Thyromegaly     Past Surgical History:  Procedure Laterality Date  . None      Family Psychiatric History: Please see initial evaluation for full details. I have reviewed the history. No updates at this time.     Family History:  Family History  Problem Relation Age of Onset  . Diabetes Mother   . Hypertension Mother   . Stroke Mother   . Colon cancer Mother   . Alcohol abuse Mother   . Cancer - Colon Mother 54  . Pneumonia Father   . Hypertension Sister   . Anxiety disorder Sister   . Hypertension Sister   . Leukemia Sister   . Diabetes Maternal Grandmother   . Hypertension Maternal Grandmother   . Hypertension Maternal Grandfather   . Heart attack Maternal Uncle     Social History:  Social History   Socioeconomic History  . Marital status: Single    Spouse name: Not on file  . Number of children: 2  . Years of education: Not on file  . Highest education level: Not on file  Occupational History  . 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 Financial  resource strain: Not on file  . Food insecurity:    Worry: Not on file    Inability: Not on file  . Transportation needs:    Medical: Not on file    Non-medical: Not on file  Tobacco Use  . Smoking status: Never Smoker  . Smokeless tobacco: Never Used  Substance and Sexual Activity  . Alcohol use: No    Comment: 01-17-2017 per pt no  . Drug use: No    Comment: 01-17-2017 per pt no  . Sexual activity: Yes    Partners: Male    Birth control/protection: Other-see comments    Comment: bf had vasectomy  Lifestyle  . Physical activity:    Days per week: Not on file    Minutes per session: Not on file  . Stress: Not on file  Relationships  . Social connections:    Talks on phone: Not on file    Gets  together: Not on file    Attends religious service: Not on file    Active member of club or organization: Not on file    Attends meetings of clubs or organizations: Not on file    Relationship status: Not on file  Other Topics Concern  . Not on file  Social History Narrative  . Not on file    Allergies: No Known Allergies  Metabolic Disorder Labs: Lab Results  Component Value Date   HGBA1C 6.8 (H) 03/06/2018   MPG 148 03/06/2018   MPG 146 08/31/2017   No results found for: PROLACTIN Lab Results  Component Value Date   CHOL 154 03/06/2018   TRIG 50 03/06/2018   HDL 45 (L) 03/06/2018   CHOLHDL 3.4 03/06/2018   VLDL 13 01/08/2017   LDLCALC 96 03/06/2018   LDLCALC 107 (H) 01/08/2017   Lab Results  Component Value Date   TSH 1.23 03/06/2018   TSH 0.95 01/08/2017    Therapeutic Level Labs: No results found for: LITHIUM No results found for: VALPROATE No components found for:  CBMZ  Current Medications: Current Outpatient Medications  Medication Sig Dispense Refill  . [START ON 12/23/2018] ARIPiprazole (ABILIFY) 5 MG tablet Take 1 tablet (5 mg total) by mouth daily. 90 tablet 0  . [START ON 12/24/2018] buPROPion (WELLBUTRIN XL) 300 MG 24 hr tablet Take 1 tablet (300 mg total) by mouth daily. 90 tablet 0  . Cyanocobalamin (VITAMIN B-12 PO) Take by mouth.    Marland Kitchen HYDROcodone-acetaminophen (NORCO) 5-325 MG tablet Take one tablet at bedtime for chronic back pain 30 tablet 0  . [START ON 12/11/2018] HYDROcodone-acetaminophen (NORCO) 5-325 MG tablet Take one tablet at bedtime for chronic  Back pain 30 tablet 0  . [START ON 01/11/2019] HYDROcodone-acetaminophen (NORCO) 5-325 MG tablet Take one tablet at bedtime for chronic back pain 30 tablet 0  . metoprolol tartrate (LOPRESSOR) 25 MG tablet Take 1 tablet (25 mg total) by mouth daily. 90 tablet 1  . temazepam (RESTORIL) 30 MG capsule TAKE 1 CAPSULE BY MOUTH ONCE DAILY AT BEDTIME AS NEEDED FOR SLEEP 90 capsule 1  .  triamterene-hydrochlorothiazide (MAXZIDE-25) 37.5-25 MG tablet Take 1 tablet by mouth daily. 90 tablet 3  . venlafaxine XR (EFFEXOR-XR) 150 MG 24 hr capsule 225 mg daily (150 mg + 75 mg) 90 capsule 0  . venlafaxine XR (EFFEXOR-XR) 75 MG 24 hr capsule 225 mg daily (150 mg + 75 mg) 90 capsule 0   No current facility-administered medications for this visit.      Musculoskeletal: Strength & Muscle  Tone: N/A Gait & Station: N/A Patient leans: N/A  Psychiatric Specialty Exam: Review of Systems  Psychiatric/Behavioral: Positive for depression. Negative for hallucinations, memory loss, substance abuse and suicidal ideas. The patient is nervous/anxious and has insomnia.   All other systems reviewed and are negative.   There were no vitals taken for this visit.There is no height or weight on file to calculate BMI.  General Appearance: Fairly Groomed  Eye Contact:  Good  Speech:  Clear and Coherent  Volume:  Normal  Mood:  Anxious and Depressed  Affect:  Appropriate, Congruent, Restricted and down  Thought Process:  Coherent  Orientation:  Full (Time, Place, and Person)  Thought Content: Logical   Suicidal Thoughts:  No  Homicidal Thoughts:  No  Memory:  Immediate;   Good  Judgement:  Good  Insight:  Fair  Psychomotor Activity:  Normal  Concentration:  Concentration: Good and Attention Span: Good  Recall:  Good  Fund of Knowledge: Good  Language: Good  Akathisia:  No  Handed:  Right  AIMS (if indicated): not done  Assets:  Communication Skills Desire for Improvement  ADL's:  Intact  Cognition: WNL  Sleep:  Poor   Screenings: GAD-7     Counselor from 05/15/2018 in [facility] Office Visit from 01/08/2017 in [facility]  Total GAD-7 Score  17  14    PHQ2-9     Office Visit from 11/06/2018 in [facility] Office Visit from 08/04/2018 in [facility] Office Visit from 05/15/2018 in [facility]  Office Visit from 03/06/2018 in [facility] Office Visit from 01/23/2018 in [facility]  PHQ-2 Total Score  3  2  2  6  4   PHQ-9 Total Score  12  5  10  24  19        Assessment and Plan:  Mary Roman is a 48 y.o. year old female with a history of depression, diabetes, who presents for follow up appointment for Major depressive disorder, recurrent episode, moderate ([place])  # MDD, moderate, recurrent without psychotic features Patient reports worsening in depressive symptoms and anxiety in the context of her fianc losing his job.  Will uptitrate venlafaxine to target depression and anxiety.  Discussed potential side effect of headache and hypertension.  Will continue bupropion as adjunctive treatment for depression.  She has no known history of seizure.  Will continue Abilify as adjunctive treatment for depression.  Discussed potential metabolic side effect.  Discussed behavioral activation.   Plan I have reviewed and updated plans as below 1. Increase venlafaxine 225 mg daily (150 mg + 75 mg) 2. Continue bupropion 300 mg daily (side effect from higher dose) 3. Continue Abilify 5 mg daily  4. Next appointment 5/11 at 4 PM for 15 mins -  temazepam 30 mg at night as needed for sleep - on gabapentin for pain and hydroxyzine  Past trials of medication: fluoxetine, duloxetine, buspirone, Trazodone ("weird" feeling) temazepam, Xanax  The patient demonstrates the following risk factors for suicide: Chronic risk factors for suicide include: psychiatric disorder of depression, anxietyand chronic pain. Acute risk factorsfor suicide include: N/A. Protective factorsfor this patient include: positive social support, responsibility to others (children, family), coping skills and hope for the future. Considering these factors, the overall suicide risk at this point appears to be low. Patient isappropriate for outpatient follow up.  [name], MD 11/11/2018, 8:29 AM

## 2018-11-05 ENCOUNTER — Telehealth: Payer: Self-pay | Admitting: Neurology

## 2018-11-05 ENCOUNTER — Ambulatory Visit: Payer: Self-pay | Admitting: Family Medicine

## 2018-11-05 NOTE — Telephone Encounter (Signed)
Due to current COVID 19 pandemic, our office is severely reducing in office visits for at least the next 2 weeks, in order to minimize the risk to our patients and healthcare providers. Our staff will contact you for next steps. I left a message for the pt to call me back for me to go over what a virtual visit is and see if the pt is capable of doing a virtual visit . Once pt calls back if she wishes to do a telephone visit I will go over the consent form with the pt.

## 2018-11-06 ENCOUNTER — Ambulatory Visit (INDEPENDENT_AMBULATORY_CARE_PROVIDER_SITE_OTHER): Payer: No Typology Code available for payment source | Admitting: Family Medicine

## 2018-11-06 ENCOUNTER — Other Ambulatory Visit: Payer: Self-pay

## 2018-11-06 ENCOUNTER — Encounter: Payer: Self-pay | Admitting: Family Medicine

## 2018-11-06 VITALS — BP 120/70 | Ht 65.0 in | Wt 230.0 lb

## 2018-11-06 DIAGNOSIS — I10 Essential (primary) hypertension: Secondary | ICD-10-CM

## 2018-11-06 DIAGNOSIS — R52 Pain, unspecified: Secondary | ICD-10-CM

## 2018-11-06 DIAGNOSIS — E785 Hyperlipidemia, unspecified: Secondary | ICD-10-CM | POA: Diagnosis not present

## 2018-11-06 DIAGNOSIS — D509 Iron deficiency anemia, unspecified: Secondary | ICD-10-CM

## 2018-11-06 DIAGNOSIS — R002 Palpitations: Secondary | ICD-10-CM

## 2018-11-06 DIAGNOSIS — F331 Major depressive disorder, recurrent, moderate: Secondary | ICD-10-CM

## 2018-11-06 DIAGNOSIS — E669 Obesity, unspecified: Secondary | ICD-10-CM

## 2018-11-06 DIAGNOSIS — E1169 Type 2 diabetes mellitus with other specified complication: Secondary | ICD-10-CM

## 2018-11-06 MED ORDER — HYDROCODONE-ACETAMINOPHEN 5-325 MG PO TABS: ORAL_TABLET | ORAL | 0 refills | Status: AC

## 2018-11-06 MED ORDER — HYDROCODONE-ACETAMINOPHEN 5-325 MG PO TABS: ORAL_TABLET | ORAL | 0 refills | Status: DC

## 2018-11-06 NOTE — Assessment & Plan Note (Signed)
Controlled, no change in medication  

## 2018-11-06 NOTE — Assessment & Plan Note (Signed)
Controlled, no change in medication DASH diet and commitment to daily physical activity for a minimum of 30 minutes discussed and encouraged, as a part of hypertension management. The importance of attaining a healthy weight is also discussed.  BP/Weight 11/06/2018 08/14/2018 08/04/2018 06/23/2018 05/15/2018 03/06/2018 2/42/6834  Systolic BP 196 99 222 979 892 119 417  Diastolic BP 70 60 84 90 86 88 82  Wt. (Lbs) 230 231.6 228 223.4 221.12 215 212  BMI 38.27 38.54 37.94 37.18 43.18 35.78 35.28  Some encounter information is confidential and restricted. Go to Review Flowsheets activity to see all data.

## 2018-11-06 NOTE — Assessment & Plan Note (Signed)
continue psych treatment and therapy

## 2018-11-06 NOTE — Progress Notes (Signed)
Virtual Visit via Telephone Note  I connected with Mary Roman on 11/06/18 at  8:00 AM EDT by telephone and verified that I am speaking with the correct person using two identifiers.   I discussed the limitations, risks, security and privacy concerns of performing an evaluation and management service by telephone and the availability of in person appointments. I also discussed with the patient that there may be a patient responsible charge related to this service. The patient expressed understanding and agreed to proceed. Patient is at her workplace and I am at home, face to face contact, though desired, is not possible  History of Present Illness:    Observations/Objective: BP 120/70   Ht 5\' 5"  (1.651 m)   Wt 230 lb (104.3 kg)   BMI 38.27 kg/m    Assessment and Plan: Essential hypertension Controlled, no change in medication DASH diet and commitment to daily physical activity for a minimum of 30 minutes discussed and encouraged, as a part of hypertension management. The importance of attaining a healthy weight is also discussed.  BP/Weight 11/06/2018 08/14/2018 08/04/2018 06/23/2018 05/15/2018 03/06/2018 5/32/9924  Systolic BP 268 99 341 962 229 798 921  Diastolic BP 70 60 84 90 86 88 82  Wt. (Lbs) 230 231.6 228 223.4 221.12 215 212  BMI 38.27 38.54 37.94 37.18 43.18 35.78 35.28  Some encounter information is confidential and restricted. Go to Review Flowsheets activity to see all data.       Morbid obesity ([place]) Deteriorated.  Patient re-educated about  the importance of commitment to a  minimum of 150 minutes of exercise per week as able.  The importance of healthy food choices with portion control discussed, as well as eating regularly and within a 12 hour window most days. The need to choose "clean , green" food 50 to 75% of the time is discussed, as well as to make water the primary drink and set a goal of 64 ounces water daily.  Encouraged to start a food diary,   and to consider  joining a support group. Sample diet sheets offered. Goals set by the patient for the next several months.   Weight /BMI 11/06/2018 08/14/2018 08/04/2018  WEIGHT 230 lb 231 lb 9.6 oz 228 lb  HEIGHT 5\' 5"  5\' 5"  5\' 5"   BMI 38.27 kg/m2 38.54 kg/m2 37.94 kg/m2  Some encounter information is confidential and restricted. Go to Review Flowsheets activity to see all data.      Encounter for pain management The patient's Controlled Substance registry is reviewed and compliance confirmed. Adequacy of  Pain control and level of function is assessed. Medication dosing is adjusted as deemed appropriate. Twelve weeks of medication is prescribed , patient signs for the script and is provided with a follow up appointment between 11 to 12 weeks .   Dyslipidemia (high LDL; low HDL) Hyperlipidemia:Low fat diet discussed and encouraged.   Lipid Panel  Lab Results  Component Value Date   CHOL 154 03/06/2018   HDL 45 (L) 03/06/2018   LDLCALC 96 03/06/2018   TRIG 50 03/06/2018   CHOLHDL 3.4 03/06/2018   Updated lab needed at/ before next visit.     Diabetes mellitus type 2 in obese [facility]) Not taking medication, not testing blood sugar, reports overeating and poor food choice.  Updated lab is needed will have this at the hospital  Palpitations Controlled, no change in medication   IDA (iron deficiency anemia) Updated lab needed at/ before next visit. She is to proceed with  colonoscopy  Major depressive disorder, recurrent episode, moderate ([place]) continue psych treatment and therapy    Follow Up Instructions:    I discussed the assessment and treatment plan with the patient. The patient was provided an opportunity to ask questions and all were answered. The patient agreed with the plan and demonstrated an understanding of the instructions.   The patient was advised to call back or seek an in-person evaluation if the symptoms worsen or if the condition fails to improve as  anticipated.  I provided  25 minutes of non-face-to-face time during this encounter.   Tula Nakayama, MD

## 2018-11-06 NOTE — Addendum Note (Signed)
Addended by: Eual Fines on: 11/06/2018 10:57 AM   Modules accepted: Orders

## 2018-11-06 NOTE — Assessment & Plan Note (Signed)
Updated lab needed at/ before next visit. She is to proceed with colonoscopy

## 2018-11-06 NOTE — Assessment & Plan Note (Signed)
Deteriorated.  Patient re-educated about  the importance of commitment to a  minimum of 150 minutes of exercise per week as able.  The importance of healthy food choices with portion control discussed, as well as eating regularly and within a 12 hour window most days. The need to choose "clean , green" food 50 to 75% of the time is discussed, as well as to make water the primary drink and set a goal of 64 ounces water daily.  Encouraged to start a food diary,  and to consider  joining a support group. Sample diet sheets offered. Goals set by the patient for the next several months.   Weight /BMI 11/06/2018 08/14/2018 08/04/2018  WEIGHT 230 lb 231 lb 9.6 oz 228 lb  HEIGHT 5\' 5"  5\' 5"  5\' 5"   BMI 38.27 kg/m2 38.54 kg/m2 37.94 kg/m2  Some encounter information is confidential and restricted. Go to Review Flowsheets activity to see all data.

## 2018-11-06 NOTE — Assessment & Plan Note (Signed)
Hyperlipidemia:Low fat diet discussed and encouraged.   Lipid Panel  Lab Results  Component Value Date   CHOL 154 03/06/2018   HDL 45 (L) 03/06/2018   LDLCALC 96 03/06/2018   TRIG 50 03/06/2018   CHOLHDL 3.4 03/06/2018   Updated lab needed at/ before next visit.

## 2018-11-06 NOTE — Patient Instructions (Addendum)
F/U week of July 6 call if t you need me sooner  Please get te following labs at the hospital the week of April 20: fasting lipid, cmp and EGFr, HBA1C, CBC and iron level  Please work on increasing activity, and controlling food choice  .You will ned your pap in October this year, and I am looking out for your colonoscopy report as well as for the result of the sleep study  All the best in your new position  Thanks for choosing [facility], we consider it a privelige to serve you.    Back Exercises If you have pain in your back, do these exercises 2-3 times each day or as told by your doctor. When the pain goes away, do the exercises once each day, but repeat the steps more times for each exercise (do more repetitions). If you do not have pain in your back, do these exercises once each day or as told by your doctor. Exercises Single Knee to Chest Do these steps 3-5 times in a row for each leg: 1. Lie on your back on a firm bed or the floor with your legs stretched out. 2. Bring one knee to your chest. 3. Hold your knee to your chest by grabbing your knee or thigh. 4. Pull on your knee until you feel a gentle stretch in your lower back. 5. Keep doing the stretch for 10-30 seconds. 6. Slowly let go of your leg and straighten it. Pelvic Tilt Do these steps 5-10 times in a row: 1. Lie on your back on a firm bed or the floor with your legs stretched out. 2. Bend your knees so they point up to the ceiling. Your feet should be flat on the floor. 3. Tighten your lower belly (abdomen) muscles to press your lower back against the floor. This will make your tailbone point up to the ceiling instead of pointing down to your feet or the floor. 4. Stay in this position for 5-10 seconds while you gently tighten your muscles and breathe evenly. Cat-Cow Do these steps until your lower back bends more easily: 1. Get on your hands and knees on a firm surface. Keep your hands under your  shoulders, and keep your knees under your hips. You may put padding under your knees. 2. Let your head hang down, and make your tailbone point down to the floor so your lower back is round like the back of a cat. 3. Stay in this position for 5 seconds. 4. Slowly lift your head and make your tailbone point up to the ceiling so your back hangs low (sags) like the back of a cow. 5. Stay in this position for 5 seconds.  Press-Ups Do these steps 5-10 times in a row: 1. Lie on your belly (face-down) on the floor. 2. Place your hands near your head, about shoulder-width apart. 3. While you keep your back relaxed and keep your hips on the floor, slowly straighten your arms to raise the top half of your body and lift your shoulders. Do not use your back muscles. To make yourself more comfortable, you may change where you place your hands. 4. Stay in this position for 5 seconds. 5. Slowly return to lying flat on the floor.  Bridges Do these steps 10 times in a row: 1. Lie on your back on a firm surface. 2. Bend your knees so they point up to the ceiling. Your feet should be flat on the floor. 3. Tighten your butt  muscles and lift your butt off of the floor until your waist is almost as high as your knees. If you do not feel the muscles working in your butt and the back of your thighs, slide your feet 1-2 inches farther away from your butt. 4. Stay in this position for 3-5 seconds. 5. Slowly lower your butt to the floor, and let your butt muscles relax. If this exercise is too easy, try doing it with your arms crossed over your chest. Belly Crunches Do these steps 5-10 times in a row: 1. Lie on your back on a firm bed or the floor with your legs stretched out. 2. Bend your knees so they point up to the ceiling. Your feet should be flat on the floor. 3. Cross your arms over your chest. 4. Tip your chin a little bit toward your chest but do not bend your neck. 5. Tighten your belly muscles and slowly  raise your chest just enough to lift your shoulder blades a tiny bit off of the floor. 6. Slowly lower your chest and your head to the floor. Back Lifts Do these steps 5-10 times in a row: 1. Lie on your belly (face-down) with your arms at your sides, and rest your forehead on the floor. 2. Tighten the muscles in your legs and your butt. 3. Slowly lift your chest off of the floor while you keep your hips on the floor. Keep the back of your head in line with the curve in your back. Look at the floor while you do this. 4. Stay in this position for 3-5 seconds. 5. Slowly lower your chest and your face to the floor. Contact a doctor if:  Your back pain gets a lot worse when you do an exercise.  Your back pain does not lessen 2 hours after you exercise. If you have any of these problems, stop doing the exercises. Do not do them again unless your doctor says it is okay. Get help right away if:  You have sudden, very bad back pain. If this happens, stop doing the exercises. Do not do them again unless your doctor says it is okay. This information is not intended to replace advice given to you by your health care provider. Make sure you discuss any questions you have with your health care provider. Document Released: 08/18/2010 Document Revised: 04/09/2018 Document Reviewed: 09/09/2014 Elsevier Interactive Patient Education  [employer].

## 2018-11-06 NOTE — Assessment & Plan Note (Signed)
The patient's Controlled Substance registry is reviewed and compliance confirmed. Adequacy of  Pain control and level of function is assessed. Medication dosing is adjusted as deemed appropriate. Twelve weeks of medication is prescribed , patient signs for the script and is provided with a follow up appointment between 11 to 12 weeks .  

## 2018-11-06 NOTE — Assessment & Plan Note (Signed)
Not taking medication, not testing blood sugar, reports overeating and poor food choice.  Updated lab is needed will have this at the hospital

## 2018-11-10 ENCOUNTER — Ambulatory Visit (HOSPITAL_COMMUNITY): Payer: No Typology Code available for payment source | Admitting: Psychiatry

## 2018-11-11 ENCOUNTER — Encounter (HOSPITAL_COMMUNITY): Payer: Self-pay | Admitting: Psychiatry

## 2018-11-11 ENCOUNTER — Ambulatory Visit (INDEPENDENT_AMBULATORY_CARE_PROVIDER_SITE_OTHER): Payer: No Typology Code available for payment source | Admitting: Psychiatry

## 2018-11-11 ENCOUNTER — Other Ambulatory Visit: Payer: Self-pay

## 2018-11-11 DIAGNOSIS — Z79899 Other long term (current) drug therapy: Secondary | ICD-10-CM

## 2018-11-11 DIAGNOSIS — F331 Major depressive disorder, recurrent, moderate: Secondary | ICD-10-CM

## 2018-11-11 MED ORDER — VENLAFAXINE HCL ER 150 MG PO CP24: ORAL_CAPSULE | ORAL | 0 refills | Status: DC

## 2018-11-11 MED ORDER — BUPROPION HCL ER (XL) 300 MG PO TB24: 300.0000 mg | ORAL_TABLET | Freq: Every day | ORAL | 0 refills | Status: DC

## 2018-11-11 MED ORDER — VENLAFAXINE HCL ER 75 MG PO CP24: ORAL_CAPSULE | ORAL | 0 refills | Status: DC

## 2018-11-11 MED ORDER — ARIPIPRAZOLE 5 MG PO TABS: 5.0000 mg | ORAL_TABLET | Freq: Every day | ORAL | 0 refills | Status: DC

## 2018-11-11 MED FILL — VENLAFAXINE HCL ER 75 MG CA: 75 | 90 days supply | Qty: 90 | Fill #0

## 2018-11-11 MED FILL — buPROPion HCL ER (XL) 300 M: 300 | 90 days supply | Qty: 90 | Fill #0

## 2018-11-11 MED FILL — VENLAFAXINE HCL ER 150 MG C: 150 | 90 days supply | Qty: 90 | Fill #0

## 2018-11-24 ENCOUNTER — Ambulatory Visit (HOSPITAL_COMMUNITY): Payer: No Typology Code available for payment source | Admitting: Psychiatry

## 2018-11-24 ENCOUNTER — Telehealth: Payer: Self-pay | Admitting: Obstetrics & Gynecology

## 2018-11-24 NOTE — Telephone Encounter (Signed)
Spoke with pt. Pt has been on period x 3 weeks. It slacks up but it doesn't stop. Pt is not having any cramps. Has no energy. Advised she needs an appt. Will need to check hgb. Pt voiced understanding and call was transferred to front desk for appt. [place]

## 2018-11-24 NOTE — Telephone Encounter (Signed)
Please call pt in ref to bleeding for 3 weeks and is starting to get concerned and feeling weak. Would like for a nurse to give her a call and advise if she needs to come in for appointment

## 2018-11-25 ENCOUNTER — Telehealth: Payer: Self-pay | Admitting: *Deleted

## 2018-11-25 NOTE — Telephone Encounter (Signed)
mychart message to patient with restrictions.

## 2018-11-26 ENCOUNTER — Other Ambulatory Visit (HOSPITAL_COMMUNITY)
Admission: RE | Admit: 2018-11-26 | Discharge: 2018-11-26 | Disposition: A | Discharge status: Home / Self Care | Payer: No Typology Code available for payment source | Source: Ambulatory Visit | Attending: Obstetrics & Gynecology | Admitting: Obstetrics & Gynecology

## 2018-11-26 ENCOUNTER — Encounter: Payer: Self-pay | Admitting: Obstetrics & Gynecology

## 2018-11-26 ENCOUNTER — Ambulatory Visit: Payer: No Typology Code available for payment source | Admitting: Obstetrics & Gynecology

## 2018-11-26 ENCOUNTER — Other Ambulatory Visit: Payer: Self-pay

## 2018-11-26 VITALS — BP 142/85 | HR 102 | Ht 65.0 in

## 2018-11-26 DIAGNOSIS — N921 Excessive and frequent menstruation with irregular cycle: Secondary | ICD-10-CM | POA: Diagnosis present

## 2018-11-26 DIAGNOSIS — N920 Excessive and frequent menstruation with regular cycle: Secondary | ICD-10-CM

## 2018-11-26 DIAGNOSIS — D219 Benign neoplasm of connective and other soft tissue, unspecified: Secondary | ICD-10-CM

## 2018-11-26 LAB — POCT HEMOGLOBIN: Hemoglobin: 7.3 g/dL — AB (ref 11–14.6)

## 2018-11-26 MED ORDER — FERRALET 90 90-1 MG PO TABS: 1.0000 | ORAL_TABLET | Freq: Every day | ORAL | 11 refills | Status: DC

## 2018-11-26 MED ORDER — MEGESTROL ACETATE 40 MG PO TABS: ORAL_TABLET | ORAL | 3 refills | Status: DC

## 2018-11-26 NOTE — Progress Notes (Signed)
Chief Complaint  Patient presents with  . Menorrhagia      48 y.o. [id number] Patient's last menstrual period was 11/05/2018. The current method of family planning is none.  Outpatient Encounter Medications as of 11/26/2018  Medication Sig  . [START ON 12/23/2018] ARIPiprazole (ABILIFY) 5 MG tablet Take 1 tablet (5 mg total) by mouth daily.  Derrill Memo ON 12/24/2018] buPROPion (WELLBUTRIN XL) 300 MG 24 hr tablet Take 1 tablet (300 mg total) by mouth daily.  . Cyanocobalamin (VITAMIN B-12 PO) Take by mouth.  . metoprolol tartrate (LOPRESSOR) 25 MG tablet Take 1 tablet (25 mg total) by mouth daily.  . temazepam (RESTORIL) 30 MG capsule TAKE 1 CAPSULE BY MOUTH ONCE DAILY AT BEDTIME AS NEEDED FOR SLEEP  . triamterene-hydrochlorothiazide (MAXZIDE-25) 37.5-25 MG tablet Take 1 tablet by mouth daily.  Marland Kitchen venlafaxine XR (EFFEXOR-XR) 150 MG 24 hr capsule 225 mg daily (150 mg + 75 mg)  . venlafaxine XR (EFFEXOR-XR) 75 MG 24 hr capsule 225 mg daily (150 mg + 75 mg)  . Fe Cbn-Fe Gluc-FA-B12-C-DSS (FERRALET 90) 90-1 MG TABS Take 1 tablet by mouth daily.  Marland Kitchen HYDROcodone-acetaminophen (NORCO) 5-325 MG tablet Take one tablet at bedtime for chronic back pain (Patient not taking: Reported on 11/26/2018)  . [START ON 12/11/2018] HYDROcodone-acetaminophen (NORCO) 5-325 MG tablet Take one tablet at bedtime for chronic  Back pain (Patient not taking: Reported on 11/26/2018)  . [START ON 01/11/2019] HYDROcodone-acetaminophen (NORCO) 5-325 MG tablet Take one tablet at bedtime for chronic back pain (Patient not taking: Reported on 11/26/2018)  . megestrol (MEGACE) 40 MG tablet 3 tablets a day for 5 days, 2 tablets a day for 5 days then 1 tablet daily  . [DISCONTINUED] ferrous sulfate 325 (65 FE) MG EC tablet Take 325 mg by mouth 3 (three) times daily with meals.   No facility-administered encounter medications on file as of 11/26/2018.     Subjective Pt is having increase heaviness with clots of her menstrual bleeding  Worsening cramps  Has bled up to 14 days hemnoglobin 7.6 Past Medical History:  Diagnosis Date  . Anxiety   . Chronic back pain   . Diabetes mellitus, type II ([place])   . Headache(784.0)   . Hypertension   . Obesity   . Palpitation   . Thyromegaly     Past Surgical History:  Procedure Laterality Date  . None      OB History    Gravida  2   Para  2   Term  2   Preterm      AB      Living  2     SAB      TAB      Ectopic      Multiple      Live Births              No Known Allergies  Social History   Socioeconomic History  . Marital status: Single    Spouse name: Not on file  . Number of children: 2  . Years of education: Not on file  . Highest education level: Not on file  Occupational History  . 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 Financial resource strain: Not on file  . Food insecurity:    Worry: Not on file    Inability: Not on file  . Transportation needs:    Medical: Not on file    Non-medical: Not on file  Tobacco Use  . Smoking status: Never Smoker  . Smokeless tobacco: Never Used  Substance and Sexual Activity  . Alcohol use: No    Comment: 01-17-2017 per pt no  . Drug use: No    Comment: 01-17-2017 per pt no  . Sexual activity: Yes    Partners: Male    Birth control/protection: Other-see comments    Comment: bf had vasectomy  Lifestyle  . Physical activity:    Days per week: Not on file    Minutes per session: Not on file  . Stress: Not on file  Relationships  . Social connections:    Talks on phone: Not on file    Gets together: Not on file    Attends religious service: Not on file    Active member of club or organization: Not on file    Attends meetings of clubs or organizations: Not on file    Relationship status: Not on file  Other Topics Concern  . Not on file  Social History Narrative  . Not on file    Family History  Problem Relation Age of Onset  . Diabetes Mother   . Hypertension  Mother   . Stroke Mother   . Colon cancer Mother   . Alcohol abuse Mother   . Cancer - Colon Mother 4  . Pneumonia Father   . Hypertension Sister   . Anxiety disorder Sister   . Hypertension Sister   . Leukemia Sister   . Diabetes Maternal Grandmother   . Hypertension Maternal Grandmother   . Hypertension Maternal Grandfather   . Heart attack Maternal Uncle     Medications:       Current Outpatient Medications:  .  [START ON 12/23/2018] ARIPiprazole (ABILIFY) 5 MG tablet, Take 1 tablet (5 mg total) by mouth daily., Disp: 90 tablet, Rfl: 0 .  [START ON 12/24/2018] buPROPion (WELLBUTRIN XL) 300 MG 24 hr tablet, Take 1 tablet (300 mg total) by mouth daily., Disp: 90 tablet, Rfl: 0 .  Cyanocobalamin (VITAMIN B-12 PO), Take by mouth., Disp: , Rfl:  .  metoprolol tartrate (LOPRESSOR) 25 MG tablet, Take 1 tablet (25 mg total) by mouth daily., Disp: 90 tablet, Rfl: 1 .  temazepam (RESTORIL) 30 MG capsule, TAKE 1 CAPSULE BY MOUTH ONCE DAILY AT BEDTIME AS NEEDED FOR SLEEP, Disp: 90 capsule, Rfl: 1 .  triamterene-hydrochlorothiazide (MAXZIDE-25) 37.5-25 MG tablet, Take 1 tablet by mouth daily., Disp: 90 tablet, Rfl: 3 .  venlafaxine XR (EFFEXOR-XR) 150 MG 24 hr capsule, 225 mg daily (150 mg + 75 mg), Disp: 90 capsule, Rfl: 0 .  venlafaxine XR (EFFEXOR-XR) 75 MG 24 hr capsule, 225 mg daily (150 mg + 75 mg), Disp: 90 capsule, Rfl: 0 .  Fe Cbn-Fe Gluc-FA-B12-C-DSS (FERRALET 90) 90-1 MG TABS, Take 1 tablet by mouth daily., Disp: 30 each, Rfl: 11 .  HYDROcodone-acetaminophen (NORCO) 5-325 MG tablet, Take one tablet at bedtime for chronic back pain (Patient not taking: Reported on 11/26/2018), Disp: 30 tablet, Rfl: 0 .  [START ON 12/11/2018] HYDROcodone-acetaminophen (NORCO) 5-325 MG tablet, Take one tablet at bedtime for chronic  Back pain (Patient not taking: Reported on 11/26/2018), Disp: 30 tablet, Rfl: 0 .  [START ON 01/11/2019] HYDROcodone-acetaminophen (NORCO) 5-325 MG tablet, Take one tablet at  bedtime for chronic back pain (Patient not taking: Reported on 11/26/2018), Disp: 30 tablet, Rfl: 0 .  megestrol (MEGACE) 40 MG tablet, 3 tablets a day for 5 days, 2 tablets a day for 5 days then 1  tablet daily, Disp: 45 tablet, Rfl: 3  Objective Blood pressure (!) 142/85, pulse (!) 102, height 5\' 5"  (1.651 m), last menstrual period 11/05/2018.  General WDWN female NAD Vulva:  normal appearing vulva with no masses, tenderness or lesions Vagina:  normal mucosa, no discharge Cervix:  Normal no lesions Uterus:  normal size, contour, position, consistency, mobility, non-tender Adnexa: ovaries:present,  normal adnexa in size, nontender and no masses   Pertinent ROS No burning with urination, frequency or urgency No nausea, vomiting or diarrhea Nor fever chills or other constitutional symptoms   Labs or studies     Impression Diagnoses this Encounter::   ICD-10-CM   1. Menometrorrhagia N92.1 US PELVIS (TRANSABDOMINAL ONLY)    US PELVIS TRANSVANGINAL NON-OB (TV ONLY)  2. Menorrhagia with regular cycle N92.0 POCT hemoglobin  3. Fibroids D21.9 US PELVIS (TRANSABDOMINAL ONLY)    US PELVIS TRANSVANGINAL NON-OB (TV ONLY)    Established relevant diagnosis(es):   Plan/Recommendations: Meds ordered this encounter  Medications  . megestrol (MEGACE) 40 MG tablet    Sig: 3 tablets a day for 5 days, 2 tablets a day for 5 days then 1 tablet daily    Dispense:  45 tablet    Refill:  3  . Fe Cbn-Fe Gluc-FA-B12-C-DSS (FERRALET 90) 90-1 MG TABS    Sig: Take 1 tablet by mouth daily.    Dispense:  30 each    Refill:  11    Labs or Scans Ordered: Orders Placed This Encounter  Procedures  . US PELVIS (TRANSABDOMINAL ONLY)  . US PELVIS TRANSVANGINAL NON-OB (TV ONLY)  . POCT hemoglobin    Management:: >megestrol to stop bleeding >ferralet 90 for iron supplementation  Sonogram 6 weeks to evaluate uterus  Follow up Return in about 6 weeks (around 01/07/2019) for GYN sono, Follow up,  with Dr Elonda Husky.      All questions were answered.

## 2018-11-28 ENCOUNTER — Other Ambulatory Visit: Payer: Self-pay | Admitting: Family Medicine

## 2018-11-28 ENCOUNTER — Ambulatory Visit (HOSPITAL_COMMUNITY): Payer: No Typology Code available for payment source | Admitting: Psychiatry

## 2018-11-28 LAB — CYTOLOGY - PAP
Chlamydia: NEGATIVE
Diagnosis: NEGATIVE
Diagnosis: REACTIVE
HPV: NOT DETECTED
Neisseria Gonorrhea: NEGATIVE

## 2018-11-28 MED FILL — TEMAZEPAM 30 MG CAPSULE: 30 | 90 days supply | Qty: 90 | Fill #0

## 2018-11-28 MED FILL — METOPROLOL TARTRATE 25 MG T: 25 | 90 days supply | Qty: 90 | Fill #0

## 2018-11-28 MED FILL — TRIAMTERENE/HCTZ 37.5/25 TB: 37.5-25 | 90 days supply | Qty: 90 | Fill #0

## 2018-12-02 ENCOUNTER — Telehealth: Payer: Self-pay | Admitting: Neurology

## 2018-12-02 NOTE — Telephone Encounter (Signed)
LVM x3 to offer this patient a virtual visit for her 5/12 appt. I advised patient in VM that appt has been cancelled, and to call back to reschedule.

## 2018-12-03 NOTE — Progress Notes (Deleted)
BH MD/PA/NP OP Progress Note  12/03/2018 2:00 PM Mary Roman  MRN:  [id number]  Chief Complaint:  HPI: *** Visit Diagnosis: No diagnosis found.  Past Psychiatric History: Please see initial evaluation for full details. I have reviewed the history. No updates at this time.     Past Medical History:  Past Medical History:  Diagnosis Date  . Anxiety   . Chronic back pain   . Diabetes mellitus, type II ([place])   . Headache(784.0)   . Hypertension   . Obesity   . Palpitation   . Thyromegaly     Past Surgical History:  Procedure Laterality Date  . None      Family Psychiatric History: Please see initial evaluation for full details. I have reviewed the history. No updates at this time.     Family History:  Family History  Problem Relation Age of Onset  . Diabetes Mother   . Hypertension Mother   . Stroke Mother   . Colon cancer Mother   . Alcohol abuse Mother   . Cancer - Colon Mother 77  . Pneumonia Father   . Hypertension Sister   . Anxiety disorder Sister   . Hypertension Sister   . Leukemia Sister   . Diabetes Maternal Grandmother   . Hypertension Maternal Grandmother   . Hypertension Maternal Grandfather   . Heart attack Maternal Uncle     Social History:  Social History   Socioeconomic History  . Marital status: Single    Spouse name: Not on file  . Number of children: 2  . Years of education: Not on file  . Highest education level: Not on file  Occupational History  . 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 Financial resource strain: Not on file  . Food insecurity:    Worry: Not on file    Inability: Not on file  . Transportation needs:    Medical: Not on file    Non-medical: Not on file  Tobacco Use  . Smoking status: Never Smoker  . Smokeless tobacco: Never Used  Substance and Sexual Activity  . Alcohol use: No    Comment: 01-17-2017 per pt no  . Drug use: No    Comment: 01-17-2017 per pt no  . Sexual activity: Yes     Partners: Male    Birth control/protection: Other-see comments    Comment: bf had vasectomy  Lifestyle  . Physical activity:    Days per week: Not on file    Minutes per session: Not on file  . Stress: Not on file  Relationships  . Social connections:    Talks on phone: Not on file    Gets together: Not on file    Attends religious service: Not on file    Active member of club or organization: Not on file    Attends meetings of clubs or organizations: Not on file    Relationship status: Not on file  Other Topics Concern  . Not on file  Social History Narrative  . Not on file    Allergies: No Known Allergies  Metabolic Disorder Labs: Lab Results  Component Value Date   HGBA1C 6.8 (H) 03/06/2018   MPG 148 03/06/2018   MPG 146 08/31/2017   No results found for: PROLACTIN Lab Results  Component Value Date   CHOL 154 03/06/2018   TRIG 50 03/06/2018   HDL 45 (L) 03/06/2018   CHOLHDL 3.4 03/06/2018   VLDL 13 01/08/2017  [place] 96 03/06/2018   LDLCALC 107 (H) 01/08/2017   Lab Results  Component Value Date   TSH 1.23 03/06/2018   TSH 0.95 01/08/2017    Therapeutic Level Labs: No results found for: LITHIUM No results found for: VALPROATE No components found for:  CBMZ  Current Medications: Current Outpatient Medications  Medication Sig Dispense Refill  . [START ON 12/23/2018] ARIPiprazole (ABILIFY) 5 MG tablet Take 1 tablet (5 mg total) by mouth daily. 90 tablet 0  . [START ON 12/24/2018] buPROPion (WELLBUTRIN XL) 300 MG 24 hr tablet Take 1 tablet (300 mg total) by mouth daily. 90 tablet 0  . Cyanocobalamin (VITAMIN B-12 PO) Take by mouth.    . Fe Cbn-Fe Gluc-FA-B12-C-DSS (FERRALET 90) 90-1 MG TABS Take 1 tablet by mouth daily. 30 each 11  . HYDROcodone-acetaminophen (NORCO) 5-325 MG tablet Take one tablet at bedtime for chronic back pain (Patient not taking: Reported on 11/26/2018) 30 tablet 0  . [START ON 12/11/2018] HYDROcodone-acetaminophen (NORCO) 5-325 MG  tablet Take one tablet at bedtime for chronic  Back pain (Patient not taking: Reported on 11/26/2018) 30 tablet 0  . [START ON 01/11/2019] HYDROcodone-acetaminophen (NORCO) 5-325 MG tablet Take one tablet at bedtime for chronic back pain (Patient not taking: Reported on 11/26/2018) 30 tablet 0  . megestrol (MEGACE) 40 MG tablet 3 tablets a day for 5 days, 2 tablets a day for 5 days then 1 tablet daily 45 tablet 3  . metoprolol tartrate (LOPRESSOR) 25 MG tablet TAKE 1 TABLET (25 MG TOTAL) BY MOUTH DAILY. 90 tablet 1  . temazepam (RESTORIL) 30 MG capsule TAKE 1 CAPSULE BY MOUTH ONCE DAILY AT BEDTIME AS NEEDED FOR SLEEP 90 capsule 1  . triamterene-hydrochlorothiazide (MAXZIDE-25) 37.5-25 MG tablet Take 1 tablet by mouth daily. 90 tablet 3  . venlafaxine XR (EFFEXOR-XR) 150 MG 24 hr capsule 225 mg daily (150 mg + 75 mg) 90 capsule 0  . venlafaxine XR (EFFEXOR-XR) 75 MG 24 hr capsule 225 mg daily (150 mg + 75 mg) 90 capsule 0   No current facility-administered medications for this visit.      Musculoskeletal: Strength & Muscle Tone: N/A Gait & Station: N/A Patient leans: N/A  Psychiatric Specialty Exam: ROS  There were no vitals taken for this visit.There is no height or weight on file to calculate BMI.  General Appearance: {Appearance:22683}  Eye Contact:  {BHH EYE CONTACT:22684}  Speech:  Clear and Coherent  Volume:  Normal  Mood:  {BHH MOOD:22306}  Affect:  {Affect (PAA):22687}  Thought Process:  Coherent  Orientation:  Full (Time, Place, and Person)  Thought Content: Logical   Suicidal Thoughts:  {ST/HT (PAA):22692}  Homicidal Thoughts:  {ST/HT (PAA):22692}  Memory:  Immediate;   Good  Judgement:  {Judgement (PAA):22694}  Insight:  {Insight (PAA):22695}  Psychomotor Activity:  Normal  Concentration:  Concentration: Good and Attention Span: Good  Recall:  Good  Fund of Knowledge: Good  Language: Good  Akathisia:  No  Handed:  Right  AIMS (if indicated): not done  Assets:   Communication Skills Desire for Improvement  ADL's:  Intact  Cognition: WNL  Sleep:  {BHH GOOD/FAIR/POOR:22877}   Screenings: GAD-7     Counselor from 05/15/2018 in [facility] Office Visit from 01/08/2017 in [facility]  Total GAD-7 Score  17  14    PHQ2-9     Office Visit from 11/06/2018 in [facility] Office Visit from 08/04/2018 in [facility] Office Visit from 05/15/2018  in [facility] Office Visit from 03/06/2018 in [facility] Office Visit from 01/23/2018 in [facility]  PHQ-2 Total Score  3  2  2  6  4   PHQ-9 Total Score  12  5  10  24  19        Assessment and Plan:  Mary Roman is a 48 y.o. year old female with a history of depression, diabetes , who presents for follow up appointment for No diagnosis found.  # MDD, moderate, recurrent without psychotic features  Patient reports worsening in depressive symptoms and anxiety in the context of her fianc losing his job.  Will uptitrate venlafaxine to target depression and anxiety.  Discussed potential side effect of headache and hypertension.  Will continue bupropion as adjunctive treatment for depression.  She has no known history of seizure.  Will continue Abilify as adjunctive treatment for depression.  Discussed potential metabolic side effect.  Discussed behavioral activation.   Plan  1. Increase venlafaxine 225 mg daily (150 mg + 75 mg) 2. Continue bupropion 300 mg daily (side effect from higher dose) 3. Continue Abilify 5 mg daily  4.Next appointment 5/11 at 4 PM for 15 mins -temazepam 30 mg at night as needed for sleep -on gabapentin for pain and hydroxyzine  Past trials of medication: fluoxetine, duloxetine, buspirone, Trazodone ("weird" feeling) temazepam, Xanax  The patient demonstrates the following risk factors for suicide: Chronic risk factors for suicide include: psychiatric disorder of  depression, anxietyand chronic pain. Acute risk factorsfor suicide include: N/A. Protective factorsfor this patient include: positive social support, responsibility to others (children, family), coping skills and hope for the future. Considering these factors, the overall suicide risk at this point appears to be low. Patient isappropriate for outpatient follow up.  [name], MD 12/03/2018, 2:00 PM

## 2018-12-08 ENCOUNTER — Ambulatory Visit (HOSPITAL_COMMUNITY): Payer: No Typology Code available for payment source | Admitting: Psychiatry

## 2018-12-08 ENCOUNTER — Other Ambulatory Visit: Payer: Self-pay

## 2018-12-09 ENCOUNTER — Institutional Professional Consult (permissible substitution): Payer: Self-pay | Admitting: Neurology

## 2018-12-11 ENCOUNTER — Encounter: Payer: Self-pay | Admitting: Family Medicine

## 2019-01-07 ENCOUNTER — Encounter: Payer: Self-pay | Admitting: *Deleted

## 2019-01-07 ENCOUNTER — Other Ambulatory Visit: Payer: No Typology Code available for payment source

## 2019-01-08 ENCOUNTER — Ambulatory Visit (INDEPENDENT_AMBULATORY_CARE_PROVIDER_SITE_OTHER): Payer: No Typology Code available for payment source

## 2019-01-08 ENCOUNTER — Ambulatory Visit (INDEPENDENT_AMBULATORY_CARE_PROVIDER_SITE_OTHER): Payer: No Typology Code available for payment source | Admitting: Obstetrics & Gynecology

## 2019-01-08 ENCOUNTER — Encounter: Payer: Self-pay | Admitting: Obstetrics & Gynecology

## 2019-01-08 ENCOUNTER — Other Ambulatory Visit: Payer: Self-pay

## 2019-01-08 VITALS — BP 144/83 | HR 61 | Ht 65.0 in | Wt 226.0 lb

## 2019-01-08 DIAGNOSIS — N921 Excessive and frequent menstruation with irregular cycle: Secondary | ICD-10-CM | POA: Insufficient documentation

## 2019-01-08 DIAGNOSIS — D219 Benign neoplasm of connective and other soft tissue, unspecified: Secondary | ICD-10-CM | POA: Diagnosis not present

## 2019-01-08 NOTE — Progress Notes (Signed)
Follow up appointment for results  Chief Complaint  Patient presents with  . Follow-up    Mary Roman    Blood pressure (!) 144/83, pulse 61, height 5\' 5"  (1.651 m), weight 226 lb (102.5 kg).  Mary Roman Pelvis Transvanginal Non-ob (tv Only)  Result Date: 01/08/2019 GYNECOLOGIC SONOGRAM KINZEY SHERIFF is a 48 y.o. [id number] LMP 11/27/2018,she is here for a pelvic sonogram for menometrorrhagia.Marland Kitchen Uterus                      8 x 6 x 7.1 cm, Total uterine volume 179 cc, homogeneous anteverted uterus w/a small posterior LUS intramural fibroid .9 x .8 x .8 cm Endometrium          27.6 mm, symmetrical, ,heterogeneous thickened endometrium with color flow,no endometrial masses visualized Right ovary             2.6 x 1.4 x 2.8 cm, wnl Left ovary                2.7 x 1.2 x 2.6 cm, wnl No free fluid Technician Comments: PELVIC Mary Roman TA/TV: homogeneous anteverted uterus w/a small posterior LUS intramural fibroid .9 x .8 x .8 cm,EEC 27.6 mm,heterogeneous thickened endometrium with color flow,no endometrial masses visualized,normal ovaries bilat,ovaries appear mobile,no free fluid,no pain during ultrasound. Chaperone: Dianne Dun 01/08/2019 9:11 AM Clinical Impression and recommendations: I have reviewed the sonogram results above, combined with the patient's current clinical course, below are my impressions and any appropriate recommendations for management based on the sonographic findings. Normal uterus Endometrium is thickened but is a result of megestrol therapy Both ovaries are normal Good candidate for endometrial ablation Florian Buff 01/08/2019 9:27 AM  Mary Roman Pelvis (transabdominal Only)  Result Date: 01/08/2019 GYNECOLOGIC SONOGRAM Mary Roman is a 48 y.o. [id number] LMP 11/27/2018,she is here for a pelvic sonogram for menometrorrhagia.Marland Kitchen Uterus                      8 x 6 x 7.1 cm, Total uterine volume 179 cc, homogeneous anteverted uterus w/a small posterior LUS intramural fibroid .9 x .8 x .8 cm Endometrium           27.6 mm, symmetrical, ,heterogeneous thickened endometrium with color flow,no endometrial masses visualized Right ovary             2.6 x 1.4 x 2.8 cm, wnl Left ovary                2.7 x 1.2 x 2.6 cm, wnl No free fluid Technician Comments: PELVIC Mary Roman TA/TV: homogeneous anteverted uterus w/a small posterior LUS intramural fibroid .9 x .8 x .8 cm,EEC 27.6 mm,heterogeneous thickened endometrium with color flow,no endometrial masses visualized,normal ovaries bilat,ovaries appear mobile,no free fluid,no pain during ultrasound. Chaperone: Dianne Dun 01/08/2019 9:11 AM Clinical Impression and recommendations: I have reviewed the sonogram results above, combined with the patient's current clinical course, below are my impressions and any appropriate recommendations for management based on the sonographic findings. Normal uterus Endometrium is thickened but is a result of megestrol therapy Both ovaries are normal Good candidate for endometrial ablation Florian Buff 01/08/2019 9:27 AM     MEDS ordered this encounter: No orders of the defined types were placed in this encounter.   Orders for this encounter: No orders of the defined types were placed in this encounter.   Impression + Plan:    ICD-10-CM   1. Menometrorrhagia  N92.1  continue megestrol, scheduled for endometrial ablation 03/25/2019, risks benefits were discussed         Follow Up: Return in about 12 weeks (around 04/02/2019) for [place], with Dr Elonda Husky.       Face to face time:  15 minutes  Greater than 50% of the visit time was spent in counseling and coordination of care with the patient.  The summary and outline of the counseling and care coordination is summarized in the note above.   All questions were answered.  Past Medical History:  Diagnosis Date  . Anxiety   . Chronic back pain   . Diabetes mellitus, type II ([place])   . Headache(784.0)   . Hypertension   . Obesity   . Palpitation   . Thyromegaly      Past Surgical History:  Procedure Laterality Date  . None      OB History    Gravida  2   Para  2   Term  2   Preterm      AB      Living  2     SAB      TAB      Ectopic      Multiple      Live Births              No Known Allergies  Social History   Socioeconomic History  . Marital status: Single    Spouse name: Not on file  . Number of children: 2  . Years of education: Not on file  . Highest education level: Not on file  Occupational History  . 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 Financial resource strain: Not on file  . Food insecurity    Worry: Not on file    Inability: Not on file  . Transportation needs    Medical: Not on file    Non-medical: Not on file  Tobacco Use  . Smoking status: Never Smoker  . Smokeless tobacco: Never Used  Substance and Sexual Activity  . Alcohol use: No    Comment: 01-17-2017 per pt no  . Drug use: No    Comment: 01-17-2017 per pt no  . Sexual activity: Yes    Partners: Male    Birth control/protection: Other-see comments    Comment: bf had vasectomy  Lifestyle  . Physical activity    Days per week: Not on file    Minutes per session: Not on file  . Stress: Not on file  Relationships  . Social Herbalist on phone: Not on file    Gets together: Not on file    Attends religious service: Not on file    Active member of club or organization: Not on file    Attends meetings of clubs or organizations: Not on file    Relationship status: Not on file  Other Topics Concern  . Not on file  Social History Narrative  . Not on file    Family History  Problem Relation Age of Onset  . Diabetes Mother   . Hypertension Mother   . Stroke Mother   . Colon cancer Mother   . Alcohol abuse Mother   . Cancer - Colon Mother 81  . Pneumonia Father   . Hypertension Sister   . Anxiety disorder Sister   . Hypertension Sister   . Leukemia Sister   . Diabetes Maternal Grandmother    . Hypertension Maternal Grandmother   .  Hypertension Maternal Grandfather   . Heart attack Maternal Uncle

## 2019-01-08 NOTE — Progress Notes (Signed)
PELVIC US TA/TV: homogeneous anteverted uterus w/a small posterior LUS intramural fibroid .9 x .8 x .8 cm,EEC 27.6 mm,heterogeneous thickened endometrium with color flow,no endometrial masses visualized,normal ovaries bilat,ovaries appear mobile,no free fluid,no pain during ultrasound.  Chaperone: Estill Bamberg

## 2019-01-19 ENCOUNTER — Encounter: Payer: No Typology Code available for payment source | Admitting: Family Medicine

## 2019-01-27 ENCOUNTER — Encounter: Payer: No Typology Code available for payment source | Admitting: Family Medicine

## 2019-01-29 ENCOUNTER — Other Ambulatory Visit (HOSPITAL_COMMUNITY)
Admission: RE | Admit: 2019-01-29 | Discharge: 2019-01-29 | Disposition: A | Discharge status: Home / Self Care | Payer: No Typology Code available for payment source | Source: Ambulatory Visit | Attending: Family Medicine | Admitting: Family Medicine

## 2019-01-29 ENCOUNTER — Other Ambulatory Visit: Payer: Self-pay | Admitting: Family Medicine

## 2019-01-29 ENCOUNTER — Encounter: Payer: Self-pay | Admitting: Family Medicine

## 2019-01-29 DIAGNOSIS — D509 Iron deficiency anemia, unspecified: Secondary | ICD-10-CM | POA: Diagnosis present

## 2019-01-29 DIAGNOSIS — E1169 Type 2 diabetes mellitus with other specified complication: Secondary | ICD-10-CM | POA: Insufficient documentation

## 2019-01-29 DIAGNOSIS — E669 Obesity, unspecified: Secondary | ICD-10-CM | POA: Insufficient documentation

## 2019-01-29 DIAGNOSIS — E785 Hyperlipidemia, unspecified: Secondary | ICD-10-CM | POA: Insufficient documentation

## 2019-01-29 LAB — COMPREHENSIVE METABOLIC PANEL
ALT: 19 U/L (ref 0–44)
AST: 14 U/L — ABNORMAL LOW (ref 15–41)
Albumin: 3.7 g/dL (ref 3.5–5.0)
Alkaline Phosphatase: 65 U/L (ref 38–126)
Anion gap: 14 (ref 5–15)
BUN: 11 mg/dL (ref 6–20)
CO2: 24 mmol/L (ref 22–32)
Calcium: 9.3 mg/dL (ref 8.9–10.3)
Chloride: 102 mmol/L (ref 98–111)
Creatinine, Ser: 0.99 mg/dL (ref 0.44–1.00)
GFR calc Af Amer: >60 mL/min (ref >60)
GFR calc non Af Amer: >60 mL/min (ref >60)
Glucose, Bld: 198 mg/dL — ABNORMAL HIGH (ref 70–99)
Potassium: 3.2 mmol/L — ABNORMAL LOW (ref 3.5–5.1)
Sodium: 140 mmol/L (ref 135–145)
Total Bilirubin: 0.4 mg/dL (ref 0.3–1.2)
Total Protein: 7.2 g/dL (ref 6.5–8.1)

## 2019-01-29 LAB — IRON AND TIBC
Iron: 15 ug/dL — ABNORMAL LOW (ref 28–170)
Saturation Ratios: 3 % — ABNORMAL LOW (ref 10.4–31.8)
TIBC: 459 ug/dL — ABNORMAL HIGH (ref 250–450)
UIBC: 444 ug/dL

## 2019-01-29 LAB — CBC
HCT: 32.6 % — ABNORMAL LOW (ref 36.0–46.0)
Hemoglobin: 9.5 g/dL — ABNORMAL LOW (ref 12.0–15.0)
MCH: 22.7 pg — ABNORMAL LOW (ref 26.0–34.0)
MCHC: 29.1 g/dL — ABNORMAL LOW (ref 30.0–36.0)
MCV: 77.8 fL — ABNORMAL LOW (ref 80.0–100.0)
Platelets: 556 10*3/uL — ABNORMAL HIGH (ref 150–400)
RBC: 4.19 MIL/uL (ref 3.87–5.11)
RDW: 16.1 % — ABNORMAL HIGH (ref 11.5–15.5)
WBC: 8.2 10*3/uL (ref 4.0–10.5)
nRBC: 0 % (ref 0.0–0.2)

## 2019-01-29 LAB — LIPID PANEL
Cholesterol: 159 mg/dL (ref 0–200)
HDL: 29 mg/dL — ABNORMAL LOW (ref >40)
LDL Cholesterol: 119 mg/dL — ABNORMAL HIGH (ref 0–99)
Total CHOL/HDL Ratio: 5.5 RATIO
Triglycerides: 56 mg/dL (ref <150)
VLDL: 11 mg/dL (ref 0–40)

## 2019-01-29 LAB — HEMOGLOBIN A1C
Hgb A1c MFr Bld: 7.7 % — ABNORMAL HIGH (ref 4.8–5.6)
Mean Plasma Glucose: 174.29 mg/dL

## 2019-01-29 MED ORDER — POTASSIUM CHLORIDE ER 10 MEQ PO TBCR: 10.0000 meq | EXTENDED_RELEASE_TABLET | Freq: Two times a day (BID) | ORAL | 0 refills | Status: DC

## 2019-01-30 ENCOUNTER — Encounter: Payer: Self-pay | Admitting: Family Medicine

## 2019-02-03 ENCOUNTER — Other Ambulatory Visit: Payer: Self-pay

## 2019-02-03 ENCOUNTER — Ambulatory Visit (INDEPENDENT_AMBULATORY_CARE_PROVIDER_SITE_OTHER): Payer: No Typology Code available for payment source | Admitting: Family Medicine

## 2019-02-03 ENCOUNTER — Encounter: Payer: Self-pay | Admitting: Family Medicine

## 2019-02-03 VITALS — BP 138/80 | HR 94 | Resp 12 | Ht 65.0 in | Wt 221.1 lb

## 2019-02-03 DIAGNOSIS — I10 Essential (primary) hypertension: Secondary | ICD-10-CM | POA: Diagnosis not present

## 2019-02-03 DIAGNOSIS — E1169 Type 2 diabetes mellitus with other specified complication: Secondary | ICD-10-CM | POA: Diagnosis not present

## 2019-02-03 DIAGNOSIS — R52 Pain, unspecified: Secondary | ICD-10-CM | POA: Diagnosis not present

## 2019-02-03 DIAGNOSIS — E785 Hyperlipidemia, unspecified: Secondary | ICD-10-CM

## 2019-02-03 DIAGNOSIS — Z0000 Encounter for general adult medical examination without abnormal findings: Secondary | ICD-10-CM | POA: Diagnosis not present

## 2019-02-03 DIAGNOSIS — E669 Obesity, unspecified: Secondary | ICD-10-CM

## 2019-02-03 MED ORDER — ROSUVASTATIN CALCIUM 5 MG PO TABS: 5.0000 mg | ORAL_TABLET | Freq: Every day | ORAL | 3 refills | Status: DC

## 2019-02-03 MED ORDER — ASPIRIN EC 81 MG PO TBEC: 81.0000 mg | DELAYED_RELEASE_TABLET | Freq: Every day | ORAL | 3 refills | Status: AC

## 2019-02-03 MED ORDER — GLIPIZIDE ER 5 MG PO TB24: 5.0000 mg | ORAL_TABLET | Freq: Every day | ORAL | 3 refills | Status: DC

## 2019-02-03 NOTE — Patient Instructions (Addendum)
F/U in 13 weeks, call if you need me before  New medications as discussed, glipizide, crestor and aspirin 81 mg all related to your diabetes  You are being referred to diabetic educator, please follow through  It is important that you exercise regularly at least 30 minutes 5 times a week. If you develop chest pain, have severe difficulty breathing, or feel very tired, stop exercising immediately and seek medical attention    Fasting labs 3 to 5 days before next visit, lipid, cmp and eGFR and HBA1C and microalb   Goal for fasting blood sugar ranges from 80 to 120 and 2 hours after any meal or at bedtime should be between 130 to 170.  Aim to test once daily at least 3 days per week please  Thanks for choosing [facility], we consider it a privelige to serve you.

## 2019-02-03 NOTE — Progress Notes (Signed)
Mary Roman     MRN: [id number]      DOB: June 01, 1971  HPI: Patient is in for annual physical exam. Pain management is addressed Labs are reviewed and treatment initiated for diabetes and hyperlipidemia Immunization is reviewed , and  Is up to date PE: BP 138/80   Pulse 94   Resp 12   Ht 5\' 5"  (1.651 m)   Wt 221 lb 1.9 oz (100.3 kg)   SpO2 97%   BMI 36.80 kg/m   Pleasant  female, alert and oriented x 3, in no cardio-pulmonary distress. Afebrile. HEENT No facial trauma or asymetry. Sinuses non tender.  Extra occullar muscles intact, External ears normal. Oropharynx moist, no exudate. Neck: supple, no adenopathy,JVD or thyromegaly.No bruits.  Chest: Clear to ascultation bilaterally.No crackles or wheezes. Non tender to palpation  Breast: No asymetry,no masses or lumps. No tenderness. No nipple discharge or inversion. No axillary or supraclavicular adenopathy  Cardiovascular system; Heart sounds normal,  S1 and  S2 ,no S3.  No murmur, or thrill. Apical beat not displaced Peripheral pulses normal.  Abdomen: Soft, non tender, no organomegaly or masses. No bruits. Bowel sounds normal. No guarding, tenderness or rebound.  .  GU: Not examined, asymptomatic  Musculoskeletal exam: Decreased  ROM of spine,adequate in  hips , shoulders and knees. No deformity ,swelling or crepitus noted. No muscle wasting or atrophy.   Neurologic: Cranial nerves 2 to 12 intact. Power, tone ,sensation normal throughout. No disturbance in gait. No tremor.  Skin: Intact, no ulceration, erythema , scaling or rash noted. Pigmentation normal throughout  Psych; Normal mood and flat  affect. Judgement and concentration normal   Assessment & Plan:  Annual physical exam Annual exam as documented. Counseling done  re healthy lifestyle involving commitment to 150 minutes exercise per week, heart healthy diet, and attaining healthy weight.The importance of adequate sleep also  discussed. Regular seat belt use and home safety, is also discussed. Changes in health habits are decided on by the patient with goals and time frames  set for achieving them. Immunization and cancer screening needs are specifically addressed at this visit.   Encounter for pain management The patient's Controlled Substance registry is reviewed and compliance confirmed. Adequacy of  Pain control and level of function is assessed. Medication dosing is adjusted as deemed appropriate. Twelve weeks of medication is prescribed , patient signs for the script and is provided with a follow up appointment between 11 to 12 weeks .   Diabetes mellitus type 2 in obese (HCC) Deteriorated, start once daily glipizide Mary Roman is reminded of the importance of commitment to daily physical activity for 30 minutes or more, as able and the need to limit carbohydrate intake to 30 to 60 grams per meal to help with blood sugar control.   The need to take medication as prescribed, test blood sugar as directed, and to call between visits if there is a concern that blood sugar is uncontrolled is also discussed.   Mary Roman is reminded of the importance of daily foot exam, annual eye examination, and good blood sugar, blood pressure and cholesterol control.  Diabetic Labs Latest Ref Rng & Units 01/29/2019 03/06/2018 01/24/2018 08/31/2017 01/08/2017  HbA1c 4.8 - 5.6 % 7.7(H) 6.8(H) - 6.7(H) 7.1(H)  Microalbumin mg/dL - 1.7 13.9(H) - -  Micro/Creat Ratio <30 mcg/mg creat - 10 9.0 - -  Chol 0 - 200 mg/dL 159 154 - - 157  HDL >40 mg/dL 29(L) 45(L) - -  37(L)  Calc LDL 0 - 99 mg/dL 119(H) 96 - - 107(H)  Triglycerides <150 mg/dL 56 50 - - 64  Creatinine 0.44 - 1.00 mg/dL 0.99 0.95 - 0.93 0.88   BP/Weight 02/03/2019 01/08/2019 11/26/2018 11/06/2018 08/14/2018 08/04/2018 84/69/6295  Systolic BP 284 132 440 102 99 725 366  Diastolic BP 80 83 85 70 60 84 90  Wt. (Lbs) 221.12 226 - 230 231.6 228 223.4  BMI 36.8 37.61 38.27  38.27 38.54 37.94 37.18  Some encounter information is confidential and restricted. Go to Review Flowsheets activity to see all data.   Foot/eye exam completion dates Latest Ref Rng & Units 02/03/2019 01/23/2018  Eye Exam No Retinopathy - No Retinopathy  Foot Form Completion - Done -        Dyslipidemia (high LDL; low HDL) Hyperlipidemia:Low fat diet discussed and encouraged.   Lipid Panel  Lab Results  Component Value Date   CHOL 159 01/29/2019   HDL 29 (L) 01/29/2019   LDLCALC 119 (H) 01/29/2019   TRIG 56 01/29/2019   CHOLHDL 5.5 01/29/2019   Start low dose crestor and reduce fat intake    Morbid obesity ([place]) Obesity associated with hypertension, diabetes and dyslipidemia  Patient re-educated about  the importance of commitment to a  minimum of 150 minutes of exercise per week as able.  The importance of healthy food choices with portion control discussed, as well as eating regularly and within a 12 hour window most days. The need to choose "clean , green" food 50 to 75% of the time is discussed, as well as to make water the primary drink and set a goal of 64 ounces water daily.    Weight /BMI 02/03/2019 01/08/2019 11/26/2018  WEIGHT 221 lb 1.9 oz 226 lb -  HEIGHT 5\' 5"  5\' 5"  5\' 5"   BMI 36.8 kg/m2 37.61 kg/m2 38.27 kg/m2  Some encounter information is confidential and restricted. Go to Review Flowsheets activity to see all data.

## 2019-02-04 ENCOUNTER — Encounter: Payer: No Typology Code available for payment source | Admitting: Family Medicine

## 2019-02-07 ENCOUNTER — Encounter: Payer: Self-pay | Admitting: Family Medicine

## 2019-02-07 DIAGNOSIS — Z0000 Encounter for general adult medical examination without abnormal findings: Secondary | ICD-10-CM | POA: Insufficient documentation

## 2019-02-07 MED ORDER — HYDROCODONE-ACETAMINOPHEN 5-325 MG PO TABS: ORAL_TABLET | ORAL | 0 refills | Status: AC

## 2019-02-07 MED ORDER — HYDROCODONE-ACETAMINOPHEN 5-325 MG PO TABS: ORAL_TABLET | ORAL | 0 refills | Status: DC

## 2019-02-07 NOTE — Assessment & Plan Note (Signed)
The patient's Controlled Substance registry is reviewed and compliance confirmed. Adequacy of  Pain control and level of function is assessed. Medication dosing is adjusted as deemed appropriate. Twelve weeks of medication is prescribed , patient signs for the script and is provided with a follow up appointment between 11 to 12 weeks .  

## 2019-02-07 NOTE — Assessment & Plan Note (Addendum)
Deteriorated, start once daily glipizide Mary Roman is reminded of the importance of commitment to daily physical activity for 30 minutes or more, as able and the need to limit carbohydrate intake to 30 to 60 grams per meal to help with blood sugar control.   The need to take medication as prescribed, test blood sugar as directed, and to call between visits if there is a concern that blood sugar is uncontrolled is also discussed.   Mary Roman is reminded of the importance of daily foot exam, annual eye examination, and good blood sugar, blood pressure and cholesterol control.  Diabetic Labs Latest Ref Rng & Units 01/29/2019 03/06/2018 01/24/2018 08/31/2017 01/08/2017  HbA1c 4.8 - 5.6 % 7.7(H) 6.8(H) - 6.7(H) 7.1(H)  Microalbumin mg/dL - 1.7 13.9(H) - -  Micro/Creat Ratio <30 mcg/mg creat - 10 9.0 - -  Chol 0 - 200 mg/dL 159 154 - - 157  HDL >40 mg/dL 29(L) 45(L) - - 37(L)  Calc LDL 0 - 99 mg/dL 119(H) 96 - - 107(H)  Triglycerides <150 mg/dL 56 50 - - 64  Creatinine 0.44 - 1.00 mg/dL 0.99 0.95 - 0.93 0.88   BP/Weight 02/03/2019 01/08/2019 11/26/2018 11/06/2018 08/14/2018 08/04/2018 61/60/7371  Systolic BP 062 694 854 627 99 035 009  Diastolic BP 80 83 85 70 60 84 90  Wt. (Lbs) 221.12 226 - 230 231.6 228 223.4  BMI 36.8 37.61 38.27 38.27 38.54 37.94 37.18  Some encounter information is confidential and restricted. Go to Review Flowsheets activity to see all data.   Foot/eye exam completion dates Latest Ref Rng & Units 02/03/2019 01/23/2018  Eye Exam No Retinopathy - No Retinopathy  Foot Form Completion - Done -

## 2019-02-07 NOTE — Assessment & Plan Note (Signed)

## 2019-02-07 NOTE — Assessment & Plan Note (Signed)
Hyperlipidemia:Low fat diet discussed and encouraged.   Lipid Panel  Lab Results  Component Value Date   CHOL 159 01/29/2019   HDL 29 (L) 01/29/2019   LDLCALC 119 (H) 01/29/2019   TRIG 56 01/29/2019   CHOLHDL 5.5 01/29/2019   Start low dose crestor and reduce fat intake

## 2019-02-07 NOTE — Assessment & Plan Note (Signed)
Obesity associated with hypertension, diabetes and dyslipidemia  Patient re-educated about  the importance of commitment to a  minimum of 150 minutes of exercise per week as able.  The importance of healthy food choices with portion control discussed, as well as eating regularly and within a 12 hour window most days. The need to choose "clean , green" food 50 to 75% of the time is discussed, as well as to make water the primary drink and set a goal of 64 ounces water daily.    Weight /BMI 02/03/2019 01/08/2019 11/26/2018  WEIGHT 221 lb 1.9 oz 226 lb -  HEIGHT 5\' 5"  5\' 5"  5\' 5"   BMI 36.8 kg/m2 37.61 kg/m2 38.27 kg/m2  Some encounter information is confidential and restricted. Go to Review Flowsheets activity to see all data.

## 2019-02-11 ENCOUNTER — Encounter: Payer: No Typology Code available for payment source | Admitting: Family Medicine

## 2019-02-25 ENCOUNTER — Other Ambulatory Visit (INDEPENDENT_AMBULATORY_CARE_PROVIDER_SITE_OTHER): Payer: Self-pay | Admitting: *Deleted

## 2019-02-25 DIAGNOSIS — D508 Other iron deficiency anemias: Secondary | ICD-10-CM

## 2019-02-26 NOTE — Progress Notes (Signed)
Virtual Visit via Video Note  I connected with Mary Roman on 03/03/19 at  8:00 AM EDT by a video enabled telemedicine application and verified that I am speaking with the correct person using two identifiers.   I discussed the limitations of evaluation and management by telemedicine and the availability of in person appointments. The patient expressed understanding and agreed to proceed.     I discussed the assessment and treatment plan with the patient. The patient was provided an opportunity to ask questions and all were answered. The patient agreed with the plan and demonstrated an understanding of the instructions.   The patient was advised to call back or seek an in-person evaluation if the symptoms worsen or if the condition fails to improve as anticipated.  I provided 15 minutes of non-face-to-face time during this encounter.   [name], MD    [facility] MD/PA/NP OP Progress Note  03/03/2019 8:34 AM Mary Roman  MRN:  [id number]  Chief Complaint:  Chief Complaint    Follow-up; Depression     HPI:  This is a follow-up appointment for depression.  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  She is still trying to get adjusted to her new work. She works 10:30- 7pm, and goes to bed after returning to work. She stays in the bed most of the time on weekends. She complains of significant fatigue, although she makes herself go to church on Sunday. She reports fair relationship with her fianc.  He is still unemployed, and she is concerned about the safety.  She has middle insomnia.  She has fair concentration.  She has slightly decreased appetite.  She feels depressed.  She denies SI.  She feels a little more anxious due to her work.  She denies panic attacks.  She agrees to try doing some physical activity on weekend.  She snores at night.  She has never been studied for sleep study and is willing to do a test.    Visit Diagnosis:    ICD-10-CM   1. Major depressive disorder, recurrent episode, moderate (HCC)  F33.1   2. Insomnia, unspecified type  G47.00 Ambulatory referral to Pulmonology    Past Psychiatric History: Please see initial evaluation for full details. I have reviewed the history. No updates at this time.     Past Medical History:  Past Medical History:  Diagnosis Date  . Anxiety   . Chronic back pain   . Diabetes mellitus, type II ([place])   . Headache(784.0)   . Hypertension   . Obesity   . Palpitation   . Thyromegaly     Past Surgical History:  Procedure Laterality Date  . None      Family Psychiatric History: Please see initial evaluation for full details. I have reviewed the history. No updates at this time.     Family History:  Family History  Problem Relation Age of Onset  . Diabetes Mother   . Hypertension Mother   . Stroke Mother   . Colon cancer Mother   . Alcohol abuse Mother   . Cancer - Colon Mother 26  . Pneumonia Father   . Hypertension Sister   . Anxiety disorder Sister   . Hypertension Sister   . Leukemia Sister   . Diabetes Maternal Grandmother   . Hypertension Maternal Grandmother   . Hypertension Maternal Grandfather   . Heart attack Maternal Uncle     Social History:  Social History   Socioeconomic History  . Marital status: Single    Spouse name: Not on file  . Number of children: 2  . Years of education: Not on file  . Highest education level: Not on file  Occupational History  . 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 Financial resource strain: Not on file  . Food insecurity    Worry: Not on file    Inability: Not on file  . Transportation needs    Medical: Not on file    Non-medical: Not on file  Tobacco Use  . Smoking status: Never Smoker  . Smokeless tobacco: Never Used  Substance and Sexual Activity  . Alcohol use: No    Comment: 01-17-2017 per pt no  . Drug use: No    Comment: 01-17-2017 per pt  no  . Sexual activity: Yes    Partners: Male    Birth control/protection: Other-see comments    Comment: bf had vasectomy  Lifestyle  . Physical activity    Days per week: Not on file    Minutes per session: Not on file  . Stress: Not on file  Relationships  . Social Herbalist on phone: Not on file    Gets together: Not on file    Attends religious service: Not on file    Active member of club or organization: Not on file    Attends meetings of clubs or organizations: Not on file    Relationship status: Not on file  Other Topics Concern  . Not on file  Social History Narrative  . Not on file    Allergies: No Known Allergies  Metabolic Disorder Labs: Lab Results  Component Value Date   HGBA1C 7.7 (H) 01/29/2019   MPG 174.29 01/29/2019   MPG 148 03/06/2018   No results found for: PROLACTIN Lab Results  Component Value Date   CHOL 159 01/29/2019   TRIG 56 01/29/2019   HDL 29 (L) 01/29/2019   CHOLHDL 5.5 01/29/2019   VLDL 11 01/29/2019   LDLCALC 119 (H) 01/29/2019   LDLCALC 96 03/06/2018   Lab Results  Component Value Date   TSH 1.23 03/06/2018   TSH 0.95 01/08/2017    Therapeutic Level Labs: No results found for: LITHIUM No results found for: VALPROATE No components found for:  CBMZ  Current Medications: Current Outpatient Medications  Medication Sig Dispense Refill  . [START ON 03/23/2019] ARIPiprazole (ABILIFY) 5 MG tablet Take 1 tablet (5 mg total) by mouth daily. 90 tablet 0  . aspirin EC 81 MG tablet Take 1 tablet (81 mg total) by mouth daily. 90 tablet 3  . [START ON 03/23/2019] buPROPion (WELLBUTRIN XL) 300 MG 24 hr tablet Take 1 tablet (300 mg total) by mouth daily. 90 tablet 0  . Cyanocobalamin (VITAMIN B-12 PO) Take by mouth.    . Fe Cbn-Fe Gluc-FA-B12-C-DSS (FERRALET 90) 90-1 MG TABS Take 1 tablet by mouth daily. 30 each 11  . glipiZIDE (GLUCOTROL XL) 5 MG 24 hr tablet Take 1 tablet (5 mg total) by mouth daily with breakfast. 90 tablet 3   . [START ON 03/12/2019] HYDROcodone-acetaminophen (NORCO) 5-325 MG tablet Take one tablet at bedtime by mouth, for chronic back pain 30 tablet 0  . [START ON 04/11/2019] HYDROcodone-acetaminophen (NORCO) 5-325 MG tablet Take one tablet by mouth at bedtime for chronic back pain 30 tablet 0  . HYDROcodone-acetaminophen (NORCO/VICODIN) 5-325 MG tablet Take 1 tablet by mouth at bedtime.    Marland Kitchen  HYDROcodone-acetaminophen (NORCO/VICODIN) 5-325 MG tablet Take one tablet by mouth at bedtime for chronic back pain 30 tablet 0  . megestrol (MEGACE) 40 MG tablet 3 tablets a day for 5 days, 2 tablets a day for 5 days then 1 tablet daily 45 tablet 3  . metoprolol tartrate (LOPRESSOR) 25 MG tablet TAKE 1 TABLET (25 MG TOTAL) BY MOUTH DAILY. 90 tablet 1  . potassium chloride (KLOR-CON 10) 10 MEQ tablet Take 1 tablet (10 mEq total) by mouth 2 (two) times daily. 60 tablet 0  . rosuvastatin (CRESTOR) 5 MG tablet Take 1 tablet (5 mg total) by mouth daily. 90 tablet 3  . temazepam (RESTORIL) 30 MG capsule TAKE 1 CAPSULE BY MOUTH ONCE DAILY AT BEDTIME AS NEEDED FOR SLEEP 90 capsule 1  . triamterene-hydrochlorothiazide (MAXZIDE-25) 37.5-25 MG tablet Take 1 tablet by mouth daily. 90 tablet 3  . venlafaxine XR (EFFEXOR-XR) 150 MG 24 hr capsule 225 mg daily (150 mg + 75 mg) 90 capsule 0  . venlafaxine XR (EFFEXOR-XR) 75 MG 24 hr capsule 225 mg daily (150 mg + 75 mg) 90 capsule 0   No current facility-administered medications for this visit.      Musculoskeletal: Strength & Muscle Tone: N/A Gait & Station: N/A Patient leans: N/A  Psychiatric Specialty Exam: Review of Systems  Psychiatric/Behavioral: Positive for depression. Negative for hallucinations, memory loss, substance abuse and suicidal ideas. The patient is nervous/anxious and has insomnia.   All other systems reviewed and are negative.   There were no vitals taken for this visit.There is no height or weight on file to calculate BMI.  General Appearance:  Fairly Groomed  Eye Contact:  Good  Speech:  Clear and Coherent  Volume:  Normal  Mood:  Depressed  Affect:  Appropriate, Congruent and Restricted  Thought Process:  Coherent  Orientation:  Full (Time, Place, and Person)  Thought Content: Logical   Suicidal Thoughts:  No  Homicidal Thoughts:  No  Memory:  Immediate;   Good  Judgement:  Good  Insight:  Fair  Psychomotor Activity:  Normal  Concentration:  Concentration: Good and Attention Span: Good  Recall:  Good  Fund of Knowledge: Good  Language: Good  Akathisia:  No  Handed:  Right  AIMS (if indicated): not done  Assets:  Communication Skills Desire for Improvement  ADL's:  Intact  Cognition: WNL  Sleep:  Poor   Screenings: GAD-7     Counselor from 05/15/2018 in [facility] Office Visit from 01/08/2017 in [facility]  Total GAD-7 Score  17  14    PHQ2-9     Office Visit from 02/03/2019 in [facility] Office Visit from 11/06/2018 in [facility] Office Visit from 08/04/2018 in [facility] Office Visit from 05/15/2018 in [facility] Office Visit from 03/06/2018 in [facility]  PHQ-2 Total Score  1  3  2  2  6   PHQ-9 Total Score  -  12  5  10  24        Assessment and Plan:  BRANIYA FARRUGIA is a 48 y.o. year old female with a history of depression, diabetes, who presents for follow up appointment for depression.   # MDD, moderate, recurrent without psychotic features She continues to report depressive symptoms and anxiety since the last visit.  Psychosocial stressors includes work, and her fianc, who has lost his job.  Although she may benefit from adjustment of medication, she would like to  first try reconnecting with her therapist.  Will continue venlafaxine to target depression and anxiety.  Discussed potential side effect of headache and hypertension.  Will continue bupropion as adjunctive treatment for  depression.  She has no known history of seizure.  Will continue Abilify as adjunctive treatment for depression.  Discussed potential metabolic side effect.  Discussed behavioral activation.   # Insomnia She snores at night, has middle insomnia, and complains of fatigue.  She has never been evaluated for sleep study.  Will make referral.   Plan I have reviewed and updated plans as below 1. Continue venlafaxine 225 mg daily (150 mg + 75 mg) 2. Continue bupropion 300 mg daily (side effect from higher dose) 3. Continue Abilify 5 mg daily  4. Referral to sleep study 4.Next appointment: 9/23 at 9 AM for 20 mins, video -temazepam 30 mg at night as needed for sleep -on gabapentin for pain and hydroxyzine  Past trials of medication: fluoxetine, duloxetine, buspirone, Trazodone ("weird" feeling) temazepam, Xanax  The patient demonstrates the following risk factors for suicide: Chronic risk factors for suicide include: psychiatric disorder of depression, anxietyand chronic pain. Acute risk factorsfor suicide include: N/A. Protective factorsfor this patient include: positive social support, responsibility to others (children, family), coping skills and hope for the future. Considering these factors, the overall suicide risk at this point appears to be low. Patient isappropriate for outpatient follow up.  [name], MD 03/03/2019, 8:34 AM

## 2019-03-03 ENCOUNTER — Ambulatory Visit (INDEPENDENT_AMBULATORY_CARE_PROVIDER_SITE_OTHER): Payer: No Typology Code available for payment source | Admitting: Psychiatry

## 2019-03-03 ENCOUNTER — Other Ambulatory Visit: Payer: Self-pay

## 2019-03-03 ENCOUNTER — Encounter (HOSPITAL_COMMUNITY): Payer: Self-pay | Admitting: Psychiatry

## 2019-03-03 DIAGNOSIS — G4700 Insomnia, unspecified: Secondary | ICD-10-CM | POA: Diagnosis not present

## 2019-03-03 DIAGNOSIS — F331 Major depressive disorder, recurrent, moderate: Secondary | ICD-10-CM

## 2019-03-03 MED ORDER — VENLAFAXINE HCL ER 75 MG PO CP24: ORAL_CAPSULE | ORAL | 0 refills | Status: DC

## 2019-03-03 MED ORDER — ARIPIPRAZOLE 5 MG PO TABS: 5.0000 mg | ORAL_TABLET | Freq: Every day | ORAL | 0 refills | Status: DC

## 2019-03-03 MED ORDER — BUPROPION HCL ER (XL) 300 MG PO TB24: 300.0000 mg | ORAL_TABLET | Freq: Every day | ORAL | 0 refills | Status: DC

## 2019-03-03 MED ORDER — VENLAFAXINE HCL ER 150 MG PO CP24: ORAL_CAPSULE | ORAL | 0 refills | Status: DC

## 2019-03-03 NOTE — Patient Instructions (Signed)
1. Continue venlafaxine 225 mg daily (150 mg + 75 mg) 2. Continue bupropion 300 mg daily (side effect from higher dose) 3. Continue Abilify 5 mg daily 4. Referral to sleep study 4.Next appointment: 9/23 at 9 AM

## 2019-03-04 ENCOUNTER — Other Ambulatory Visit: Payer: Self-pay

## 2019-03-04 MED ORDER — TEMAZEPAM 30 MG PO CAPS: ORAL_CAPSULE | ORAL | 1 refills | Status: DC

## 2019-03-17 NOTE — Patient Instructions (Signed)
Mary Roman  03/17/2019     @PREFPERIOPPHARMACY @   Your procedure is scheduled on  03/25/2019 .  Report to [facility] at  0700  A.M.  Call this number if you have problems the morning of surgery:  [phone]   Remember:  Do not eat or drink after midnight.                         Take these medicines the morning of surgery with A SIP OF WATER abilify, wellbutrin, hydrocodone(if needed), metoprolol, effexor.    Do not wear jewelry, make-up or nail polish.  Do not wear lotions, powders, or perfumes. Please wear deodorant and brush your teeth.  Do not shave 48 hours prior to surgery.  Men may shave face and neck.  Do not bring valuables to the hospital.  [facility] is not responsible for any belongings or valuables.  Contacts, dentures or bridgework may not be worn into surgery.  Leave your suitcase in the car.  After surgery it may be brought to your room.  For patients admitted to the hospital, discharge time will be determined by your treatment team.  Patients discharged the day of surgery will not be allowed to drive home.   Name and phone number of your driver:   family Special instructions:  DO NOT take any medications for diabetes the morning of your surgery.  Please read over the following fact sheets that you were given. Anesthesia Post-op Instructions and Care and Recovery After Surgery       Endometrial Ablation Endometrial ablation is a procedure that destroys the thin inner layer of the lining of the uterus (endometrium). This procedure may be done:  To stop heavy periods.  To stop bleeding that is causing anemia.  To control irregular bleeding.  To treat bleeding caused by small tumors (fibroids) in the endometrium. This procedure is often an alternative to major surgery, such as removal of the uterus and cervix (hysterectomy). As a result of this procedure:  You may not be able to have children. However, if you are  premenopausal (you have not gone through menopause): ? You may still have a small chance of getting pregnant. ? You will need to use a reliable method of birth control after the procedure to prevent pregnancy.  You may stop having a menstrual period, or you may have only a small amount of bleeding during your period. Menstruation may return several years after the procedure. Tell a health care provider about:  Any allergies you have.  All medicines you are taking, including vitamins, herbs, eye drops, creams, and over-the-counter medicines.  Any problems you or family members have had with the use of anesthetic medicines.  Any blood disorders you have.  Any surgeries you have had.  Any medical conditions you have. What are the risks? Generally, this is a safe procedure. However, problems may occur, including:  A hole (perforation) in the uterus or bowel.  Infection of the uterus, bladder, or vagina.  Bleeding.  Damage to other structures or organs.  An air bubble in the lung (air embolus).  Problems with pregnancy after the procedure.  Failure of the procedure.  Decreased ability to diagnose cancer in the endometrium. What happens before the procedure?  You will have tests of your endometrium to make sure there are no pre-cancerous cells or cancer cells present.  You may have an  ultrasound of the uterus.  You may be given medicines to thin the endometrium.  Ask your health care provider about: ? Changing or stopping your regular medicines. This is especially important if you take diabetes medicines or blood thinners. ? Taking medicines such as aspirin and ibuprofen. These medicines can thin your blood. Do not take these medicines before your procedure if your doctor tells you not to.  Plan to have someone take you home from the hospital or clinic. What happens during the procedure?   You will lie on an exam table with your feet and legs supported as in a pelvic  exam.  To lower your risk of infection: ? Your health care team will wash or sanitize their hands and put on germ-free (sterile) gloves. ? Your genital area will be washed with soap.  An IV tube will be inserted into one of your veins.  You will be given a medicine to help you relax (sedative).  A surgical instrument with a light and camera (resectoscope) will be inserted into your vagina and moved into your uterus. This allows your surgeon to see inside your uterus.  Endometrial tissue will be removed using one of the following methods: ? Radiofrequency. This method uses a radiofrequency-alternating electric current to remove the endometrium. ? Cryotherapy. This method uses extreme cold to freeze the endometrium. ? Heated-free liquid. This method uses a heated saltwater (saline) solution to remove the endometrium. ? Microwave. This method uses high-energy microwaves to heat up the endometrium and remove it. ? Thermal balloon. This method involves inserting a catheter with a balloon tip into the uterus. The balloon tip is filled with heated fluid to remove the endometrium. The procedure may vary among health care providers and hospitals. What happens after the procedure?  Your blood pressure, heart rate, breathing rate, and blood oxygen level will be monitored until the medicines you were given have worn off.  As tissue healing occurs, you may notice vaginal bleeding for 4-6 weeks after the procedure. You may also experience: ? Cramps. ? Thin, watery vaginal discharge that is light pink or brown in color. ? A need to urinate more frequently than usual. ? Nausea.  Do not drive for 24 hours if you were given a sedative.  Do not have sex or insert anything into your vagina until your health care provider approves. Summary  Endometrial ablation is done to treat the many causes of heavy menstrual bleeding.  The procedure may be done only after medications have been tried to control the  bleeding.  Plan to have someone take you home from the hospital or clinic. This information is not intended to replace advice given to you by your health care provider. Make sure you discuss any questions you have with your health care provider. Document Released: 05/25/2004 Document Revised: 12/31/2017 Document Reviewed: 08/02/2016 Elsevier Patient Education  [place].  Dilation and Curettage or Vacuum Curettage, Care After These instructions give you information about caring for yourself after your procedure. Your doctor may also give you more specific instructions. Call your doctor if you have any problems or questions after your procedure. Follow these instructions at home: Activity  Do not drive or use heavy machinery while taking prescription pain medicine.  For 24 hours after your procedure, avoid driving.  Take short walks often, followed by rest periods. Ask your doctor what activities are safe for you. After one or two days, you may be able to return to your normal activities.  Do  not lift anything that is heavier than 10 lb (4.5 kg) until your doctor approves.  For at least 2 weeks, or as long as told by your doctor: ? Do not douche. ? Do not use tampons. ? Do not have sex. General instructions   Take over-the-counter and prescription medicines only as told by your doctor. This is very important if you take blood thinning medicine.  Do not take baths, swim, or use a hot tub until your doctor approves. Take showers instead of baths.  Wear compression stockings as told by your doctor.  It is up to you to get the results of your procedure. Ask your doctor when your results will be ready.  Keep all follow-up visits as told by your doctor. This is important. Contact a doctor if:  You have very bad cramps that get worse or do not get better with medicine.  You have very bad pain in your belly (abdomen).  You cannot drink fluids without throwing up  (vomiting).  You get pain in a different part of the area between your belly and thighs (pelvis).  You have bad-smelling discharge from your vagina.  You have a rash. Get help right away if:  You are bleeding a lot from your vagina. A lot of bleeding means soaking more than one sanitary pad in an hour, for 2 hours in a row.  You have clumps of blood (blood clots) coming from your vagina.  You have a fever or chills.  Your belly feels very tender or hard.  You have chest pain.  You have trouble breathing.  You cough up blood.  You feel dizzy.  You feel light-headed.  You pass out (faint).  You have pain in your neck or shoulder area. Summary  Take short walks often, followed by rest periods. Ask your doctor what activities are safe for you. After one or two days, you may be able to return to your normal activities.  Do not lift anything that is heavier than 10 lb (4.5 kg) until your doctor approves.  Do not take baths, swim, or use a hot tub until your doctor approves. Take showers instead of baths.  Contact your doctor if you have any symptoms of infection, like bad-smelling discharge from your vagina. This information is not intended to replace advice given to you by your health care provider. Make sure you discuss any questions you have with your health care provider. Document Released: 04/24/2008 Document Revised: 06/28/2017 Document Reviewed: 04/02/2016 Elsevier Patient Education  2020 [employer].  Hysteroscopy, Care After This sheet gives you information about how to care for yourself after your procedure. Your health care provider may also give you more specific instructions. If you have problems or questions, contact your health care provider. What can I expect after the procedure? After the procedure, it is common to have:  Cramping.  Bleeding. This can vary from light spotting to menstrual-like bleeding. Follow these instructions at  home: Activity  Rest for 1-2 days after the procedure.  Do not douche, use tampons, or have sex for 2 weeks after the procedure, or until your health care provider approves.  Do not drive for 24 hours after the procedure, or for as long as told by your health care provider.  Do not drive, use heavy machinery, or drink alcohol while taking prescription pain medicines. Medicines   Take over-the-counter and prescription medicines only as told by your health care provider.  Do not take aspirin during recovery. It can  increase the risk of bleeding. General instructions  Do not take baths, swim, or use a hot tub until your health care provider approves. Take showers instead of baths for 2 weeks, or for as long as told by your health care provider.  To prevent or treat constipation while you are taking prescription pain medicine, your health care provider may recommend that you: ? Drink enough fluid to keep your urine clear or pale yellow. ? Take over-the-counter or prescription medicines. ? Eat foods that are high in fiber, such as fresh fruits and vegetables, whole grains, and beans. ? Limit foods that are high in fat and processed sugars, such as fried and sweet foods.  Keep all follow-up visits as told by your health care provider. This is important. Contact a health care provider if:  You feel dizzy or lightheaded.  You feel nauseous.  You have abnormal vaginal discharge.  You have a rash.  You have pain that does not get better with medicine.  You have chills. Get help right away if:  You have bleeding that is heavier than a normal menstrual period.  You have a fever.  You have pain or cramps that get worse.  You develop new abdominal pain.  You faint.  You have pain in your shoulders.  You have shortness of breath. Summary  After the procedure, you may have cramping and some vaginal bleeding.  Do not douche, use tampons, or have sex for 2 weeks after the  procedure, or until your health care provider approves.  Do not take baths, swim, or use a hot tub until your health care provider approves. Take showers instead of baths for 2 weeks, or for as long as told by your health care provider.  Report any unusual symptoms to your health care provider.  Keep all follow-up visits as told by your health care provider. This is important. This information is not intended to replace advice given to you by your health care provider. Make sure you discuss any questions you have with your health care provider. Document Released: 05/06/2013 Document Revised: 06/28/2017 Document Reviewed: 08/14/2016 Elsevier Patient Education  2020 [place] Anesthesia, Adult, Care After This sheet gives you information about how to care for yourself after your procedure. Your health care provider may also give you more specific instructions. If you have problems or questions, contact your health care provider. What can I expect after the procedure? After the procedure, the following side effects are common:  Pain or discomfort at the IV site.  Nausea.  Vomiting.  Sore throat.  Trouble concentrating.  Feeling cold or chills.  Weak or tired.  Sleepiness and fatigue.  Soreness and body aches. These side effects can affect parts of the body that were not involved in surgery. Follow these instructions at home:  For at least 24 hours after the procedure:  Have a responsible adult stay with you. It is important to have someone help care for you until you are awake and alert.  Rest as needed.  Do not: ? Participate in activities in which you could fall or become injured. ? Drive. ? Use heavy machinery. ? Drink alcohol. ? Take sleeping pills or medicines that cause drowsiness. ? Make important decisions or sign legal documents. ? Take care of children on your own. Eating and drinking  Follow any instructions from your health care provider about  eating or drinking restrictions.  When you feel hungry, start by eating small amounts of foods that  are soft and easy to digest (bland), such as toast. Gradually return to your regular diet.  Drink enough fluid to keep your urine pale yellow.  If you vomit, rehydrate by drinking water, juice, or clear broth. General instructions  If you have sleep apnea, surgery and certain medicines can increase your risk for breathing problems. Follow instructions from your health care provider about wearing your sleep device: ? Anytime you are sleeping, including during daytime naps. ? While taking prescription pain medicines, sleeping medicines, or medicines that make you drowsy.  Return to your normal activities as told by your health care provider. Ask your health care provider what activities are safe for you.  Take over-the-counter and prescription medicines only as told by your health care provider.  If you smoke, do not smoke without supervision.  Keep all follow-up visits as told by your health care provider. This is important. Contact a health care provider if:  You have nausea or vomiting that does not get better with medicine.  You cannot eat or drink without vomiting.  You have pain that does not get better with medicine.  You are unable to pass urine.  You develop a skin rash.  You have a fever.  You have redness around your IV site that gets worse. Get help right away if:  You have difficulty breathing.  You have chest pain.  You have blood in your urine or stool, or you vomit blood. Summary  After the procedure, it is common to have a sore throat or nausea. It is also common to feel tired.  Have a responsible adult stay with you for the first 24 hours after general anesthesia. It is important to have someone help care for you until you are awake and alert.  When you feel hungry, start by eating small amounts of foods that are soft and easy to digest (bland), such as  toast. Gradually return to your regular diet.  Drink enough fluid to keep your urine pale yellow.  Return to your normal activities as told by your health care provider. Ask your health care provider what activities are safe for you. This information is not intended to replace advice given to you by your health care provider. Make sure you discuss any questions you have with your health care provider. Document Released: 10/22/2000 Document Revised: 07/19/2017 Document Reviewed: 03/01/2017 Elsevier Patient Education  2020 [employer]. How to Use Chlorhexidine for Bathing Chlorhexidine gluconate (CHG) is a germ-killing (antiseptic) solution that is used to clean the skin. It can get rid of the bacteria that normally live on the skin and can keep them away for about 24 hours. To clean your skin with CHG, you may be given:  A CHG solution to use in the shower or as part of a sponge bath.  A prepackaged cloth that contains CHG. Cleaning your skin with CHG may help lower the risk for infection:  While you are staying in the intensive care unit of the hospital.  If you have a vascular access, such as a central line, to provide short-term or long-term access to your veins.  If you have a catheter to drain urine from your bladder.  If you are on a ventilator. A ventilator is a machine that helps you breathe by moving air in and out of your lungs.  After surgery. What are the risks? Risks of using CHG include:  A skin reaction.  Hearing loss, if CHG gets in your ears.  Eye injury, if  CHG gets in your eyes and is not rinsed out.  The CHG product catching fire. Make sure that you avoid smoking and flames after applying CHG to your skin. Do not use CHG:  If you have a chlorhexidine allergy or have previously reacted to chlorhexidine.  On babies younger than 53 months of age. How to use CHG solution  Use CHG only as told by your health care provider, and follow the instructions on the  label.  Use the full amount of CHG as directed. Usually, this is one bottle. During a shower Follow these steps when using CHG solution during a shower (unless your health care provider gives you different instructions): 1. Start the shower. 2. Use your normal soap and shampoo to wash your face and hair. 3. Turn off the shower or move out of the shower stream. 4. Pour the CHG onto a clean washcloth. Do not use any type of brush or rough-edged sponge. 5. Starting at your neck, lather your body down to your toes. Make sure you follow these instructions: ? If you will be having surgery, pay special attention to the part of your body where you will be having surgery. Scrub this area for at least 1 minute. ? Do not use CHG on your head or face. If the solution gets into your ears or eyes, rinse them well with water. ? Avoid your genital area. ? Avoid any areas of skin that have broken skin, cuts, or scrapes. ? Scrub your back and under your arms. Make sure to wash skin folds. 6. Let the lather sit on your skin for 1-2 minutes or as long as told by your health care provider. 7. Thoroughly rinse your entire body in the shower. Make sure that all body creases and crevices are rinsed well. 8. Dry off with a clean towel. Do not put any substances on your body afterward-such as powder, lotion, or perfume-unless you are told to do so by your health care provider. Only use lotions that are recommended by the manufacturer. 9. Put on clean clothes or pajamas. 10. If it is the night before your surgery, sleep in clean sheets.  During a sponge bath Follow these steps when using CHG solution during a sponge bath (unless your health care provider gives you different instructions): 1. Use your normal soap and shampoo to wash your face and hair. 2. Pour the CHG onto a clean washcloth. 3. Starting at your neck, lather your body down to your toes. Make sure you follow these instructions: ? If you will be having  surgery, pay special attention to the part of your body where you will be having surgery. Scrub this area for at least 1 minute. ? Do not use CHG on your head or face. If the solution gets into your ears or eyes, rinse them well with water. ? Avoid your genital area. ? Avoid any areas of skin that have broken skin, cuts, or scrapes. ? Scrub your back and under your arms. Make sure to wash skin folds. 4. Let the lather sit on your skin for 1-2 minutes or as long as told by your health care provider. 5. Using a different clean, wet washcloth, thoroughly rinse your entire body. Make sure that all body creases and crevices are rinsed well. 6. Dry off with a clean towel. Do not put any substances on your body afterward-such as powder, lotion, or perfume-unless you are told to do so by your health care provider. Only use lotions that  are recommended by the manufacturer. 7. Put on clean clothes or pajamas. 8. If it is the night before your surgery, sleep in clean sheets. How to use CHG prepackaged cloths  Only use CHG cloths as told by your health care provider, and follow the instructions on the label.  Use the CHG cloth on clean, dry skin.  Do not use the CHG cloth on your head or face unless your health care provider tells you to.  When washing with the CHG cloth: ? Avoid your genital area. ? Avoid any areas of skin that have broken skin, cuts, or scrapes. Before surgery Follow these steps when using a CHG cloth to clean before surgery (unless your health care provider gives you different instructions): 1. Using the CHG cloth, vigorously scrub the part of your body where you will be having surgery. Scrub using a back-and-forth motion for 3 minutes. The area on your body should be completely wet with CHG when you are done scrubbing. 2. Do not rinse. Discard the cloth and let the area air-dry. Do not put any substances on the area afterward, such as powder, lotion, or perfume. 3. Put on clean  clothes or pajamas. 4. If it is the night before your surgery, sleep in clean sheets.  For general bathing Follow these steps when using CHG cloths for general bathing (unless your health care provider gives you different instructions). 1. Use a separate CHG cloth for each area of your body. Make sure you wash between any folds of skin and between your fingers and toes. Wash your body in the following order, switching to a new cloth after each step: ? The front of your neck, shoulders, and chest. ? Both of your arms, under your arms, and your hands. ? Your stomach and groin area, avoiding the genitals. ? Your right leg and foot. ? Your left leg and foot. ? The back of your neck, your back, and your buttocks. 2. Do not rinse. Discard the cloth and let the area air-dry. Do not put any substances on your body afterward-such as powder, lotion, or perfume-unless you are told to do so by your health care provider. Only use lotions that are recommended by the manufacturer. 3. Put on clean clothes or pajamas. Contact a health care provider if:  Your skin gets irritated after scrubbing.  You have questions about using your solution or cloth. Get help right away if:  Your eyes become very red or swollen.  Your eyes itch badly.  Your skin itches badly and is red or swollen.  Your hearing changes.  You have trouble seeing.  You have swelling or tingling in your mouth or throat.  You have trouble breathing.  You swallow any chlorhexidine. Summary  Chlorhexidine gluconate (CHG) is a germ-killing (antiseptic) solution that is used to clean the skin. Cleaning your skin with CHG may help to lower your risk for infection.  You may be given CHG to use for bathing. It may be in a bottle or in a prepackaged cloth to use on your skin. Carefully follow your health care provider's instructions and the instructions on the product label.  Do not use CHG if you have a chlorhexidine allergy.  Contact  your health care provider if your skin gets irritated after scrubbing. This information is not intended to replace advice given to you by your health care provider. Make sure you discuss any questions you have with your health care provider. Document Released: 04/09/2012 Document Revised: 10/02/2018 Document Reviewed:  06/13/2017 Elsevier Patient Education  [place].

## 2019-03-20 ENCOUNTER — Other Ambulatory Visit: Payer: Self-pay | Admitting: Obstetrics & Gynecology

## 2019-03-20 ENCOUNTER — Encounter (HOSPITAL_COMMUNITY): Payer: Self-pay

## 2019-03-20 ENCOUNTER — Other Ambulatory Visit: Payer: Self-pay

## 2019-03-20 ENCOUNTER — Encounter (HOSPITAL_COMMUNITY)
Admission: RE | Admit: 2019-03-20 | Discharge: 2019-03-20 | Disposition: A | Discharge status: Home / Self Care | Payer: No Typology Code available for payment source | Source: Ambulatory Visit | Attending: Obstetrics & Gynecology | Admitting: Obstetrics & Gynecology

## 2019-03-20 DIAGNOSIS — Z01812 Encounter for preprocedural laboratory examination: Secondary | ICD-10-CM | POA: Insufficient documentation

## 2019-03-20 DIAGNOSIS — Z20828 Contact with and (suspected) exposure to other viral communicable diseases: Secondary | ICD-10-CM | POA: Insufficient documentation

## 2019-03-20 HISTORY — DX: Sickle-cell trait: D57.3

## 2019-03-20 HISTORY — DX: Anemia, unspecified: D64.9

## 2019-03-20 LAB — COMPREHENSIVE METABOLIC PANEL
ALT: 27 U/L (ref 0–44)
AST: 19 U/L (ref 15–41)
Albumin: 3.7 g/dL (ref 3.5–5.0)
Alkaline Phosphatase: 73 U/L (ref 38–126)
Anion gap: 11 (ref 5–15)
BUN: 10 mg/dL (ref 6–20)
CO2: 26 mmol/L (ref 22–32)
Calcium: 9.1 mg/dL (ref 8.9–10.3)
Chloride: 104 mmol/L (ref 98–111)
Creatinine, Ser: 1.01 mg/dL — ABNORMAL HIGH (ref 0.44–1.00)
GFR calc Af Amer: >60 mL/min (ref >60)
GFR calc non Af Amer: >60 mL/min (ref >60)
Glucose, Bld: 192 mg/dL — ABNORMAL HIGH (ref 70–99)
Potassium: 3.1 mmol/L — ABNORMAL LOW (ref 3.5–5.1)
Sodium: 141 mmol/L (ref 135–145)
Total Bilirubin: 0.4 mg/dL (ref 0.3–1.2)
Total Protein: 7.4 g/dL (ref 6.5–8.1)

## 2019-03-20 LAB — URINALYSIS, ROUTINE W REFLEX MICROSCOPIC
Bacteria, UA: NONE SEEN
Bilirubin Urine: NEGATIVE
Glucose, UA: NEGATIVE mg/dL
Ketones, ur: NEGATIVE mg/dL
Leukocytes,Ua: NEGATIVE
Nitrite: NEGATIVE
Protein, ur: NEGATIVE mg/dL
Specific Gravity, Urine: 1.008 (ref 1.005–1.030)
pH: 6 (ref 5.0–8.0)

## 2019-03-20 LAB — CBC
HCT: 36.1 % (ref 36.0–46.0)
Hemoglobin: 10.7 g/dL — ABNORMAL LOW (ref 12.0–15.0)
MCH: 23.2 pg — ABNORMAL LOW (ref 26.0–34.0)
MCHC: 29.6 g/dL — ABNORMAL LOW (ref 30.0–36.0)
MCV: 78.1 fL — ABNORMAL LOW (ref 80.0–100.0)
Platelets: 524 10*3/uL — ABNORMAL HIGH (ref 150–400)
RBC: 4.62 MIL/uL (ref 3.87–5.11)
RDW: 18.7 % — ABNORMAL HIGH (ref 11.5–15.5)
WBC: 9 10*3/uL (ref 4.0–10.5)
nRBC: 0 % (ref 0.0–0.2)

## 2019-03-20 LAB — GLUCOSE, CAPILLARY: Glucose-Capillary: 181 mg/dL — ABNORMAL HIGH (ref 70–99)

## 2019-03-20 LAB — RAPID HIV SCREEN (HIV 1/2 AB+AG)
HIV 1/2 Antibodies: NONREACTIVE
HIV-1 P24 Antigen - HIV24: NONREACTIVE

## 2019-03-20 LAB — HEMOGLOBIN A1C
Hgb A1c MFr Bld: 8.4 % — ABNORMAL HIGH (ref 4.8–5.6)
Mean Plasma Glucose: 194.38 mg/dL

## 2019-03-20 LAB — HCG, QUANTITATIVE, PREGNANCY: hCG, Beta Chain, Quant, S: <1 m[IU]/mL (ref <5)

## 2019-03-20 NOTE — Progress Notes (Signed)
   03/20/19 0803  OBSTRUCTIVE SLEEP APNEA  Have you ever been diagnosed with sleep apnea through a sleep study? No  Do you snore loudly (loud enough to be heard through closed doors)?  1  Do you often feel tired, fatigued, or sleepy during the daytime (such as falling asleep during driving or talking to someone)? 1  Has anyone observed you stop breathing during your sleep? 1  Do you have, or are you being treated for high blood pressure? 1  BMI more than 35 kg/m2? 0  Age > 50 (1-yes) 0  Neck circumference greater than:Female 16 inches or larger, Female 17inches or larger? 0  Female Gender (Yes=1) 0  Obstructive Sleep Apnea Score 4

## 2019-03-23 ENCOUNTER — Other Ambulatory Visit (HOSPITAL_COMMUNITY)
Admission: RE | Admit: 2019-03-23 | Discharge: 2019-03-23 | Disposition: A | Discharge status: Home / Self Care | Payer: No Typology Code available for payment source | Source: Ambulatory Visit | Attending: Obstetrics & Gynecology | Admitting: Obstetrics & Gynecology

## 2019-03-23 ENCOUNTER — Other Ambulatory Visit: Payer: Self-pay

## 2019-03-23 DIAGNOSIS — Z01812 Encounter for preprocedural laboratory examination: Secondary | ICD-10-CM | POA: Diagnosis not present

## 2019-03-24 LAB — SARS CORONAVIRUS 2 (TAT 6-24 HRS): SARS Coronavirus 2: NEGATIVE

## 2019-03-25 ENCOUNTER — Ambulatory Visit (HOSPITAL_COMMUNITY): Payer: No Typology Code available for payment source | Admitting: Anesthesiology

## 2019-03-25 ENCOUNTER — Other Ambulatory Visit: Payer: Self-pay

## 2019-03-25 ENCOUNTER — Ambulatory Visit (HOSPITAL_COMMUNITY)
Admission: RE | Admit: 2019-03-25 | Discharge: 2019-03-25 | Disposition: A | Discharge status: Home / Self Care | Payer: No Typology Code available for payment source | Attending: Obstetrics & Gynecology | Admitting: Obstetrics & Gynecology

## 2019-03-25 ENCOUNTER — Encounter (HOSPITAL_COMMUNITY)
Admission: RE | Disposition: A | Discharge status: Home / Self Care | Payer: Self-pay | Source: Home / Self Care | Attending: Obstetrics & Gynecology

## 2019-03-25 DIAGNOSIS — Z6835 Body mass index (BMI) 35.0-35.9, adult: Secondary | ICD-10-CM | POA: Diagnosis not present

## 2019-03-25 DIAGNOSIS — Z7984 Long term (current) use of oral hypoglycemic drugs: Secondary | ICD-10-CM | POA: Insufficient documentation

## 2019-03-25 DIAGNOSIS — E119 Type 2 diabetes mellitus without complications: Secondary | ICD-10-CM | POA: Insufficient documentation

## 2019-03-25 DIAGNOSIS — N840 Polyp of corpus uteri: Secondary | ICD-10-CM | POA: Diagnosis not present

## 2019-03-25 DIAGNOSIS — I10 Essential (primary) hypertension: Secondary | ICD-10-CM | POA: Insufficient documentation

## 2019-03-25 DIAGNOSIS — N920 Excessive and frequent menstruation with regular cycle: Secondary | ICD-10-CM | POA: Diagnosis not present

## 2019-03-25 DIAGNOSIS — D573 Sickle-cell trait: Secondary | ICD-10-CM | POA: Diagnosis not present

## 2019-03-25 DIAGNOSIS — G4730 Sleep apnea, unspecified: Secondary | ICD-10-CM | POA: Insufficient documentation

## 2019-03-25 DIAGNOSIS — D500 Iron deficiency anemia secondary to blood loss (chronic): Secondary | ICD-10-CM | POA: Insufficient documentation

## 2019-03-25 DIAGNOSIS — D649 Anemia, unspecified: Secondary | ICD-10-CM | POA: Diagnosis not present

## 2019-03-25 HISTORY — PX: DILATATION AND CURETTAGE/HYSTEROSCOPY WITH MINERVA: SHX6851

## 2019-03-25 LAB — GLUCOSE, CAPILLARY: Glucose-Capillary: 201 mg/dL — ABNORMAL HIGH (ref 70–99)

## 2019-03-25 SURGERY — DILATATION AND CURETTAGE/HYSTEROSCOPY WITH MINERVA
Anesthesia: General

## 2019-03-25 MED ORDER — METOCLOPRAMIDE HCL 5 MG/ML IJ SOLN
INTRAMUSCULAR | Status: AC
  Filled 2019-03-25: qty 2

## 2019-03-25 MED ORDER — 0.9 % SODIUM CHLORIDE (POUR BTL) OPTIME
TOPICAL | Status: DC | PRN
  Administered 2019-03-25: 1000 mL

## 2019-03-25 MED ORDER — METOCLOPRAMIDE HCL 5 MG/ML IJ SOLN
INTRAMUSCULAR | Status: DC | PRN
  Administered 2019-03-25: 10 mg via INTRAVENOUS

## 2019-03-25 MED ORDER — FENTANYL CITRATE (PF) 100 MCG/2ML IJ SOLN
INTRAMUSCULAR | Status: AC
  Filled 2019-03-25: qty 4

## 2019-03-25 MED ORDER — HYDROMORPHONE HCL 1 MG/ML IJ SOLN: 0.2500 mg | INTRAMUSCULAR | Status: DC | PRN

## 2019-03-25 MED ORDER — ONDANSETRON HCL 4 MG/2ML IJ SOLN
INTRAMUSCULAR | Status: DC | PRN
  Administered 2019-03-25: 4 mg via INTRAVENOUS

## 2019-03-25 MED ORDER — LIDOCAINE HCL 1 % IJ SOLN
INTRAMUSCULAR | Status: DC | PRN
  Administered 2019-03-25: 100 mg via INTRADERMAL

## 2019-03-25 MED ORDER — MEPERIDINE HCL 50 MG/ML IJ SOLN: 6.2500 mg | INTRAMUSCULAR | Status: DC | PRN

## 2019-03-25 MED ORDER — LIDOCAINE 2% (20 MG/ML) 5 ML SYRINGE
INTRAMUSCULAR | Status: AC
  Filled 2019-03-25: qty 5

## 2019-03-25 MED ORDER — SODIUM CHLORIDE 0.9 % IR SOLN
Status: DC | PRN
  Administered 2019-03-25: 3000 mL

## 2019-03-25 MED ORDER — ONDANSETRON HCL 4 MG/2ML IJ SOLN
INTRAMUSCULAR | Status: AC
  Filled 2019-03-25: qty 2

## 2019-03-25 MED ORDER — PROPOFOL 10 MG/ML IV BOLUS
INTRAVENOUS | Status: DC | PRN
  Administered 2019-03-25: 200 mg via INTRAVENOUS

## 2019-03-25 MED ORDER — SODIUM CHLORIDE 0.9 % IV SOLN
510.0000 mg | Freq: Once | INTRAVENOUS | Status: AC
  Administered 2019-03-25: 510 mg via INTRAVENOUS
  Filled 2019-03-25: qty 17

## 2019-03-25 MED ORDER — LACTATED RINGERS IV SOLN
Freq: Once | INTRAVENOUS | Status: AC
  Administered 2019-03-25: 07:00:00 via INTRAVENOUS

## 2019-03-25 MED ORDER — ONDANSETRON 8 MG PO TBDP: 8.0000 mg | ORAL_TABLET | Freq: Three times a day (TID) | ORAL | 0 refills | Status: DC | PRN

## 2019-03-25 MED ORDER — KETOROLAC TROMETHAMINE 10 MG PO TABS: 10.0000 mg | ORAL_TABLET | Freq: Three times a day (TID) | ORAL | 0 refills | Status: DC | PRN

## 2019-03-25 MED ORDER — CEFAZOLIN SODIUM-DEXTROSE 2-4 GM/100ML-% IV SOLN
2.0000 g | INTRAVENOUS | Status: AC
  Administered 2019-03-25: 2 g via INTRAVENOUS
  Filled 2019-03-25: qty 100

## 2019-03-25 MED ORDER — MIDAZOLAM HCL 5 MG/5ML IJ SOLN
INTRAMUSCULAR | Status: DC | PRN
  Administered 2019-03-25: 2 mg via INTRAVENOUS

## 2019-03-25 MED ORDER — LACTATED RINGERS IV SOLN
INTRAVENOUS | Status: DC | PRN
  Administered 2019-03-25: 07:00:00 via INTRAVENOUS

## 2019-03-25 MED ORDER — PROPOFOL 10 MG/ML IV BOLUS
INTRAVENOUS | Status: AC
  Filled 2019-03-25: qty 40

## 2019-03-25 MED ORDER — DEXAMETHASONE SODIUM PHOSPHATE 10 MG/ML IJ SOLN
INTRAMUSCULAR | Status: DC | PRN
  Administered 2019-03-25: 8 mg via INTRAVENOUS

## 2019-03-25 MED ORDER — FENTANYL CITRATE (PF) 100 MCG/2ML IJ SOLN
INTRAMUSCULAR | Status: DC | PRN
  Administered 2019-03-25 (×2): 50 ug via INTRAVENOUS

## 2019-03-25 MED ORDER — MIDAZOLAM HCL 2 MG/2ML IJ SOLN
INTRAMUSCULAR | Status: AC
  Filled 2019-03-25: qty 2

## 2019-03-25 MED ORDER — DEXAMETHASONE SODIUM PHOSPHATE 10 MG/ML IJ SOLN
INTRAMUSCULAR | Status: AC
  Filled 2019-03-25: qty 1

## 2019-03-25 MED ORDER — ONDANSETRON HCL 4 MG/2ML IJ SOLN: 4.0000 mg | Freq: Once | INTRAMUSCULAR | Status: DC | PRN

## 2019-03-25 MED ORDER — KETOROLAC TROMETHAMINE 30 MG/ML IJ SOLN
30.0000 mg | Freq: Once | INTRAMUSCULAR | Status: AC
  Administered 2019-03-25: 30 mg via INTRAVENOUS
  Filled 2019-03-25: qty 1

## 2019-03-25 SURGICAL SUPPLY — 26 items
CLOTH BEACON ORANGE TIMEOUT ST (SAFETY) ×2 IMPLANT
COVER LIGHT HANDLE STERIS (MISCELLANEOUS) ×4 IMPLANT
COVER WAND RF STERILE (DRAPES) ×1 IMPLANT
GAUZE 4X4 16PLY RFD (DISPOSABLE) ×4 IMPLANT
GLOVE BIOGEL PI IND STRL 7.0 (GLOVE) ×2 IMPLANT
GLOVE BIOGEL PI IND STRL 8 (GLOVE) ×1 IMPLANT
GLOVE BIOGEL PI INDICATOR 7.0 (GLOVE) ×2
GLOVE BIOGEL PI INDICATOR 8 (GLOVE) ×1
GLOVE ECLIPSE 8.0 STRL XLNG CF (GLOVE) ×2 IMPLANT
GOWN STRL REUS W/TWL LRG LVL3 (GOWN DISPOSABLE) ×2 IMPLANT
GOWN STRL REUS W/TWL XL LVL3 (GOWN DISPOSABLE) ×2 IMPLANT
HANDPIECE ABLA MINERVA ENDO (MISCELLANEOUS) ×2 IMPLANT
INST SET HYSTEROSCOPY (KITS) ×2 IMPLANT
IV NS IRRIG 3000ML ARTHROMATIC (IV SOLUTION) ×1 IMPLANT
KIT TURNOVER CYSTO (KITS) ×2 IMPLANT
MANIFOLD NEPTUNE II (INSTRUMENTS) ×2 IMPLANT
MARKER SKIN DUAL TIP RULER LAB (MISCELLANEOUS) ×2 IMPLANT
NS IRRIG 1000ML POUR BTL (IV SOLUTION) ×2 IMPLANT
PACK BASIC III (CUSTOM PROCEDURE TRAY) ×2
PACK SRG BSC III STRL LF ECLPS (CUSTOM PROCEDURE TRAY) ×1 IMPLANT
PAD ARMBOARD 7.5X6 YLW CONV (MISCELLANEOUS) ×2 IMPLANT
PAD TELFA 3X4 1S STER (GAUZE/BANDAGES/DRESSINGS) ×2 IMPLANT
SET BASIN LINEN APH (SET/KITS/TRAYS/PACK) ×2 IMPLANT
SET IRRIG Y TYPE TUR BLADDER L (SET/KITS/TRAYS/PACK) ×2 IMPLANT
SHEET LAVH (DRAPES) ×2 IMPLANT
YANKAUER SUCT BULB TIP 10FT TU (MISCELLANEOUS) ×2 IMPLANT

## 2019-03-25 NOTE — H&P (Signed)
Preoperative History and Physical  Mary Roman is a 48 y.o. [id number] with No LMP recorded. admitted for a hysteroscopy uterine curettage Minerva endometrial ablation.  From 11/26/2018 note: Pt is having increase heaviness with clots of her menstrual bleeding Worsening cramps  Has bled up to 14 days hemnoglobin 7.6  Placed on megestrol with excellent response Sonogram is normal  PMH:     Past Medical History:  Diagnosis Date  . Anemia   . Anxiety   . Chronic back pain   . Diabetes mellitus, type II ([place])   . Headache(784.0)   . Hypertension   . Obesity   . Palpitation   . Sickle cell trait ([place])   . Thyromegaly     PSH:     Past Surgical History:  Procedure Laterality Date  . None      POb/GynH:      OB History    Gravida  2   Para  2   Term  2   Preterm      AB      Living  2     SAB      TAB      Ectopic      Multiple      Live Births              SH:   Social History   Tobacco Use  . Smoking status: Never Smoker  . Smokeless tobacco: Never Used  Substance Use Topics  . Alcohol use: No    Comment: 01-17-2017 per pt no  . Drug use: No    Comment: 01-17-2017 per pt no    FH:    Family History  Problem Relation Age of Onset  . Diabetes Mother   . Hypertension Mother   . Stroke Mother   . Colon cancer Mother   . Alcohol abuse Mother   . Cancer - Colon Mother 86  . Pneumonia Father   . Hypertension Sister   . Anxiety disorder Sister   . Hypertension Sister   . Leukemia Sister   . Diabetes Maternal Grandmother   . Hypertension Maternal Grandmother   . Hypertension Maternal Grandfather   . Heart attack Maternal Uncle      Allergies: No Known Allergies  Medications:       Current Facility-Administered Medications:  .  ceFAZolin (ANCEF) IVPB 2g/100 mL premix, 2 g, Intravenous, On Call to OR, Florian Buff, MD  Review of Systems:   Review of Systems  Constitutional: Negative for fever, chills, weight loss,  malaise/fatigue and diaphoresis.  HENT: Negative for hearing loss, ear pain, nosebleeds, congestion, sore throat, neck pain, tinnitus and ear discharge.   Eyes: Negative for blurred vision, double vision, photophobia, pain, discharge and redness.  Respiratory: Negative for cough, hemoptysis, sputum production, shortness of breath, wheezing and stridor.   Cardiovascular: Negative for chest pain, palpitations, orthopnea, claudication, leg swelling and PND.  Gastrointestinal: Positive for abdominal pain. Negative for heartburn, nausea, vomiting, diarrhea, constipation, blood in stool and melena.  Genitourinary: Negative for dysuria, urgency, frequency, hematuria and flank pain.  Musculoskeletal: Negative for myalgias, back pain, joint pain and falls.  Skin: Negative for itching and rash.  Neurological: Negative for dizziness, tingling, tremors, sensory change, speech change, focal weakness, seizures, loss of consciousness, weakness and headaches.  Endo/Heme/Allergies: Negative for environmental allergies and polydipsia. Does not bruise/bleed easily.  Psychiatric/Behavioral: Negative for depression, suicidal ideas, hallucinations, memory loss and substance abuse. The patient is not nervous/anxious and does  not have insomnia.      PHYSICAL EXAM:  Blood pressure 137/87, pulse 81, temperature 98 F (36.7 C), temperature source Oral, resp. rate 18, SpO2 98 %.    Vitals reviewed. Constitutional: She is oriented to person, place, and time. She appears well-developed and well-nourished.  HENT:  Head: Normocephalic and atraumatic.  Right Ear: External ear normal.  Left Ear: External ear normal.  Nose: Nose normal.  Mouth/Throat: Oropharynx is clear and moist.  Eyes: Conjunctivae and EOM are normal. Pupils are equal, round, and reactive to light. Right eye exhibits no discharge. Left eye exhibits no discharge. No scleral icterus.  Neck: Normal range of motion. Neck supple. No tracheal deviation  present. No thyromegaly present.  Cardiovascular: Normal rate, regular rhythm, normal heart sounds and intact distal pulses.  Exam reveals no gallop and no friction rub.   No murmur heard. Respiratory: Effort normal and breath sounds normal. No respiratory distress. She has no wheezes. She has no rales. She exhibits no tenderness.  GI: Soft. Bowel sounds are normal. She exhibits no distension and no mass. There is tenderness. There is no rebound and no guarding.  Genitourinary:       Vulva is normal without lesions Vagina is pink moist without discharge Cervix normal in appearance and pap is normal Uterus is normal size, contour, position, consistency, mobility, non-tender Adnexa is negative with normal sized ovaries by sonogram  Musculoskeletal: Normal range of motion. She exhibits no edema and no tenderness.  Neurological: She is alert and oriented to person, place, and time. She has normal reflexes. She displays normal reflexes. No cranial nerve deficit. She exhibits normal muscle tone. Coordination normal.  Skin: Skin is warm and dry. No rash noted. No erythema. No pallor.  Psychiatric: She has a normal mood and affect. Her behavior is normal. Judgment and thought content normal.    Labs: Results for orders placed or performed during the hospital encounter of 03/25/19 (from the past 336 hour(s))  Glucose, capillary   Collection Time: 03/25/19  7:15 AM  Result Value Ref Range   Glucose-Capillary 201 (H) 70 - 99 mg/dL  Results for orders placed or performed during the hospital encounter of 03/23/19 (from the past 336 hour(s))  SARS CORONAVIRUS 2 (TAT 6-12 HRS) Nasal Swab Aptima Multi Swab   Collection Time: 03/23/19  7:21 AM   Specimen: Aptima Multi Swab; Nasal Swab  Result Value Ref Range   SARS Coronavirus 2 NEGATIVE NEGATIVE  Results for orders placed or performed during the hospital encounter of 03/20/19 (from the past 336 hour(s))  Urinalysis, Routine w reflex microscopic    Collection Time: 03/20/19  8:00 AM  Result Value Ref Range   Color, Urine YELLOW YELLOW   APPearance HAZY (A) CLEAR   Specific Gravity, Urine 1.008 1.005 - 1.030   pH 6.0 5.0 - 8.0   Glucose, UA NEGATIVE NEGATIVE mg/dL   Hgb urine dipstick SMALL (A) NEGATIVE   Bilirubin Urine NEGATIVE NEGATIVE   Ketones, ur NEGATIVE NEGATIVE mg/dL   Protein, ur NEGATIVE NEGATIVE mg/dL   Nitrite NEGATIVE NEGATIVE   Leukocytes,Ua NEGATIVE NEGATIVE   RBC / HPF 0-5 0 - 5 RBC/hpf   WBC, UA 0-5 0 - 5 WBC/hpf   Bacteria, UA NONE SEEN NONE SEEN   Squamous Epithelial / LPF 6-10 0 - 5  CBC   Collection Time: 03/20/19  8:04 AM  Result Value Ref Range   WBC 9.0 4.0 - 10.5 K/uL   RBC 4.62 3.87 - 5.11 MIL/uL  Hemoglobin 10.7 (L) 12.0 - 15.0 g/dL   HCT 36.1 36.0 - 46.0 %   MCV 78.1 (L) 80.0 - 100.0 fL   MCH 23.2 (L) 26.0 - 34.0 pg   MCHC 29.6 (L) 30.0 - 36.0 g/dL   RDW 18.7 (H) 11.5 - 15.5 %   Platelets 524 (H) 150 - 400 K/uL   nRBC 0.0 0.0 - 0.2 %  Comprehensive metabolic panel   Collection Time: 03/20/19  8:04 AM  Result Value Ref Range   Sodium 141 135 - 145 mmol/L   Potassium 3.1 (L) 3.5 - 5.1 mmol/L   Chloride 104 98 - 111 mmol/L   CO2 26 22 - 32 mmol/L   Glucose, Bld 192 (H) 70 - 99 mg/dL   BUN 10 6 - 20 mg/dL   Creatinine, Ser 1.01 (H) 0.44 - 1.00 mg/dL   Calcium 9.1 8.9 - 10.3 mg/dL   Total Protein 7.4 6.5 - 8.1 g/dL   Albumin 3.7 3.5 - 5.0 g/dL   AST 19 15 - 41 U/L   ALT 27 0 - 44 U/L   Alkaline Phosphatase 73 38 - 126 U/L   Total Bilirubin 0.4 0.3 - 1.2 mg/dL   GFR calc non Af Amer >60 >60 mL/min   GFR calc Af Amer >60 >60 mL/min   Anion gap 11 5 - 15  hCG, quantitative, pregnancy   Collection Time: 03/20/19  8:04 AM  Result Value Ref Range   hCG, Beta Chain, Quant, S <1 <5 mIU/mL  Rapid HIV screen (HIV 1/2 Ab+Ag)   Collection Time: 03/20/19  8:04 AM  Result Value Ref Range   HIV-1 P24 Antigen - HIV24 NON REACTIVE NON REACTIVE   HIV 1/2 Antibodies NON REACTIVE NON REACTIVE    Interpretation (HIV Ag Ab)      A non reactive test result means that HIV 1 or HIV 2 antibodies and HIV 1 p24 antigen were not detected in the specimen.  Glucose, capillary   Collection Time: 03/20/19  8:07 AM  Result Value Ref Range   Glucose-Capillary 181 (H) 70 - 99 mg/dL  Hemoglobin A1c   Collection Time: 03/20/19  8:13 AM  Result Value Ref Range   Hgb A1c MFr Bld 8.4 (H) 4.8 - 5.6 %   Mean Plasma Glucose 194.38 mg/dL    EKG: Orders placed or performed in visit on 08/14/18  . EKG 12-Lead    Imaging Studies: No results found.    Assessment: Menorrhagia Iron deficiency Anemia due to chronic blood loss(menorrhagia)  Patient Active Problem List   Diagnosis Date Noted  . Annual physical exam 02/07/2019  . Menometrorrhagia 01/08/2019  . Absolute anemia 06/23/2018  . Family hx of colon cancer 06/23/2018  . IDA (iron deficiency anemia) 03/09/2018  . Morbid obesity ([place]) 09/02/2017  . Major depressive disorder, recurrent episode, moderate ([place]) 01/17/2017  . Encounter for pain management 09/14/2016  . Sleep-disordered breathing 02/02/2016  . Metabolic syndrome X [id number]  . Unspecified vitamin D deficiency 02/19/2013  . Insomnia 08/27/2011  . Diabetes mellitus type 2 in obese ([place]) 08/27/2011  . Dyslipidemia (high LDL; low HDL) 04/23/2011  . HEADACHE 09/13/2010  . Palpitations 08/04/2010  . Essential hypertension 12/24/2007  . Backache 12/24/2007    Plan: Hysteroscopy uterine curettage Minerva endometrial ablation  Florian Buff 03/25/2019 8:03 AM

## 2019-03-25 NOTE — Op Note (Signed)
Preoperative diagnosis:  1.   Menorrhagia                                         2.  Anemia due to menorrhagia   Postoperative diagnoses: Same as above   Procedure: Hysteroscopy, uterine curettage,                             Failed endometrial ablation using Minerva  Surgeon: Florian Buff   Anesthesia: Laryngeal mask airway  Findings: The endometrium was lush several small polyps. There were no fibroid or other abnormalities.  Description of operation: The patient was taken to the operating room and placed in the supine position. She underwent general anesthesia using the laryngeal mask airway. She was placed in the dorsal lithotomy position and prepped and draped in the usual sterile fashion. A Graves speculum was placed and the anterior cervical lip was grasped with a single-tooth tenaculum. The cervix was dilated serially to allow passage of the hysteroscope. Diagnostic hysteroscopy was performed and was found to be lush and have several small polyps probably secondary to the 5 months of megestrol. A vigorous uterine curettage was then performed and all tissue sent to pathology for evaluation.  All polypoid type endometrium was removed at the end of the curettage  I then proceeded to perform the Minerva endometrial ablation.   The uterus sounded to 9 cm The handpiece was attached to the Minerva power source/machine and the handpiece passed the checklist. The array was squeezed down to remove all of the air present.  The array was then place into the endometrial cavity and deployed to a length of 6.0 cm. The handpiece confirmed appropriate width by being in the green portion of the visual dial. The cervical cuff was then inflated to the point the CO2 indicator was in the green but it would not stay in the green it oscillated between green and red.  I applied a second single toot to the posterior cervical lip and double inflated the balloon with the sam CO2 indicator oscillating.  I removed  the array and redeployed everything 2 more times without any success in keeping the CO2 from escaping via the cervix, we could see it bubbling back but could not get it to seal due to the patulous cervix and lower segment. As a result the endometrial ablation could not be performed. There was appropriate post Minerva bleeding and uterine discharge.     All of the equipment worked well throughout the procedure.  The patient was awakened from anesthesia and taken to the recovery room in good stable condition all counts were correct. She received 2 g of Ancef and 30 mg of Toradol preoperatively. She will be discharged from the recovery room and followed up in the office in 1- 2 weeks.   She can expect 4 weeks of post procedure bloody watery discharge    Florian Buff, MD  03/25/2019 9:08 AM

## 2019-03-25 NOTE — Discharge Instructions (Signed)
Endometrial Ablation Endometrial ablation is a procedure that destroys the thin inner layer of the lining of the uterus (endometrium). This procedure may be done:  To stop heavy periods.  To stop bleeding that is causing anemia.  To control irregular bleeding.  To treat bleeding caused by small tumors (fibroids) in the endometrium. This procedure is often an alternative to major surgery, such as removal of the uterus and cervix (hysterectomy). As a result of this procedure:  You may not be able to have children. However, if you are premenopausal (you have not gone through menopause): ? You may still have a small chance of getting pregnant. ? You will need to use a reliable method of birth control after the procedure to prevent pregnancy.  You may stop having a menstrual period, or you may have only a small amount of bleeding during your period. Menstruation may return several years after the procedure. Tell a health care provider about:  Any allergies you have.  All medicines you are taking, including vitamins, herbs, eye drops, creams, and over-the-counter medicines.  Any problems you or family members have had with the use of anesthetic medicines.  Any blood disorders you have.  Any surgeries you have had.  Any medical conditions you have. What are the risks? Generally, this is a safe procedure. However, problems may occur, including:  A hole (perforation) in the uterus or bowel.  Infection of the uterus, bladder, or vagina.  Bleeding.  Damage to other structures or organs.  An air bubble in the lung (air embolus).  Problems with pregnancy after the procedure.  Failure of the procedure.  Decreased ability to diagnose cancer in the endometrium. What happens before the procedure?  You will have tests of your endometrium to make sure there are no pre-cancerous cells or cancer cells present.  You may have an ultrasound of the uterus.  You may be given medicines to  thin the endometrium.  Ask your health care provider about: ? Changing or stopping your regular medicines. This is especially important if you take diabetes medicines or blood thinners. ? Taking medicines such as aspirin and ibuprofen. These medicines can thin your blood. Do not take these medicines before your procedure if your doctor tells you not to.  Plan to have someone take you home from the hospital or clinic. What happens during the procedure?   You will lie on an exam table with your feet and legs supported as in a pelvic exam.  To lower your risk of infection: ? Your health care team will wash or sanitize their hands and put on germ-free (sterile) gloves. ? Your genital area will be washed with soap.  An IV tube will be inserted into one of your veins.  You will be given a medicine to help you relax (sedative).  A surgical instrument with a light and camera (resectoscope) will be inserted into your vagina and moved into your uterus. This allows your surgeon to see inside your uterus.  Endometrial tissue will be removed using one of the following methods: ? Radiofrequency. This method uses a radiofrequency-alternating electric current to remove the endometrium. ? Cryotherapy. This method uses extreme cold to freeze the endometrium. ? Heated-free liquid. This method uses a heated saltwater (saline) solution to remove the endometrium. ? Microwave. This method uses high-energy microwaves to heat up the endometrium and remove it. ? Thermal balloon. This method involves inserting a catheter with a balloon tip into the uterus. The balloon tip is filled with   heated fluid to remove the endometrium. The procedure may vary among health care providers and hospitals. What happens after the procedure?  Your blood pressure, heart rate, breathing rate, and blood oxygen level will be monitored until the medicines you were given have worn off.  As tissue healing occurs, you may notice  vaginal bleeding for 4-6 weeks after the procedure. You may also experience: ? Cramps. ? Thin, watery vaginal discharge that is light pink or brown in color. ? A need to urinate more frequently than usual. ? Nausea.  Do not drive for 24 hours if you were given a sedative.  Do not have sex or insert anything into your vagina until your health care provider approves. Summary  Endometrial ablation is done to treat the many causes of heavy menstrual bleeding.  The procedure may be done only after medications have been tried to control the bleeding.  Plan to have someone take you home from the hospital or clinic. This information is not intended to replace advice given to you by your health care provider. Make sure you discuss any questions you have with your health care provider. Document Released: 05/25/2004 Document Revised: 12/31/2017 Document Reviewed: 08/02/2016 Elsevier Patient Education  2020 [employer]  

## 2019-03-25 NOTE — Anesthesia Procedure Notes (Signed)
Procedure Name: LMA Insertion Date/Time: 03/25/2019 8:26 AM Performed by: Charmaine Downs, CRNA Pre-anesthesia Checklist: Patient identified, Patient being monitored, Emergency Drugs available, Timeout performed and Suction available Patient Re-evaluated:Patient Re-evaluated prior to induction Oxygen Delivery Method: Circle System Utilized Preoxygenation: Pre-oxygenation with 100% oxygen Induction Type: IV induction Ventilation: Mask ventilation without difficulty LMA: LMA inserted LMA Size: 4.0 Grade View: Grade II Number of attempts: 1 Placement Confirmation: positive ETCO2 and breath sounds checked- equal and bilateral Tube secured with: Tape Dental Injury: Teeth and Oropharynx as per pre-operative assessment

## 2019-03-25 NOTE — Transfer of Care (Addendum)
Immediate Anesthesia Transfer of Care Note  Patient: Mary Roman  Procedure(s) Performed: DILATATION AND CURETTAGE/HYSTEROSCOPY WITH  ATTEMPTED MINERVA ENDOMETRIAL ABLATION (N/A )  Patient Location: PACU  Anesthesia Type:General  Level of Consciousness: awake and patient cooperative  Airway & Oxygen Therapy: Patient Spontanous Breathing and Patient connected to nasal cannula oxygen  Post-op Assessment: Report given to RN, Post -op Vital signs reviewed and stable and Patient moving all extremities  Post vital signs: Reviewed and stable  Last Vitals:  Vitals Value Taken Time  BP    Temp    Pulse    Resp    SpO2      Last Pain:  Vitals:   03/25/19 0718  TempSrc: Oral  PainSc: 3       Patients Stated Pain Goal: 5 ([id number])  Complications: No apparent anesthesia complications

## 2019-03-25 NOTE — Anesthesia Preprocedure Evaluation (Addendum)
Anesthesia Evaluation  Patient identified by MRN, date of birth, ID band Patient awake    Reviewed: Allergy & Precautions, NPO status , Patient's Chart, lab work & pertinent test results, reviewed documented beta blocker date and time   Airway Mallampati: III  TM Distance: >3 FB Neck ROM: Full    Dental  (+) Poor Dentition, Chipped, Dental Advisory Given, Missing,    Pulmonary Sleep apnea: snores loudly, will get sleep study soon. ,    Pulmonary exam normal breath sounds clear to auscultation       Cardiovascular METS: 3 - Mets hypertension (took metoprolol this morning), Pt. on medications and Pt. on home beta blockers Normal cardiovascular exam Rhythm:Regular Rate:Normal     Neuro/Psych  Headaches, PSYCHIATRIC DISORDERS Anxiety Depression    GI/Hepatic negative GI ROS,   Endo/Other  diabetes (fsbs - 205), Type 2, Oral Hypoglycemic Agents  Renal/GU      Musculoskeletal   Abdominal (+) + obese,   Peds  Hematology  (+) anemia ,   Anesthesia Other Findings   Reproductive/Obstetrics                           Anesthesia Physical Anesthesia Plan  ASA: III  Anesthesia Plan: General   Post-op Pain Management:    Induction:   PONV Risk Score and Plan: Ondansetron, Dexamethasone, Metaclopromide and Midazolam  Airway Management Planned: LMA  Additional Equipment:   Intra-op Plan:   Post-operative Plan: Extubation in OR  Informed Consent: I have reviewed the patients History and Physical, chart, labs and discussed the procedure including the risks, benefits and alternatives for the proposed anesthesia with the patient or authorized representative who has indicated his/her understanding and acceptance.     Dental advisory given  Plan Discussed with: CRNA  Anesthesia Plan Comments:        Anesthesia Quick Evaluation

## 2019-03-25 NOTE — Anesthesia Postprocedure Evaluation (Signed)
Anesthesia Post Note  Patient: Mary Roman  Procedure(s) Performed: DILATATION AND CURETTAGE/HYSTEROSCOPY WITH  ATTEMPTED MINERVA ENDOMETRIAL ABLATION (N/A )  Patient location during evaluation: PACU Anesthesia Type: General Level of consciousness: awake and patient cooperative Pain management: pain level controlled Vital Signs Assessment: post-procedure vital signs reviewed and stable Respiratory status: spontaneous breathing, respiratory function stable and nonlabored ventilation Cardiovascular status: blood pressure returned to baseline Postop Assessment: no apparent nausea or vomiting Anesthetic complications: no     Last Vitals:  Vitals:   03/25/19 0924 03/25/19 0930  BP: 122/76 118/78  Pulse: 77 74  Resp: 14 14  Temp: 37.2 C   SpO2: 95% 99%    Last Pain:  Vitals:   03/25/19 0924  TempSrc:   PainSc: 0-No pain                 [name] J

## 2019-03-26 ENCOUNTER — Encounter (HOSPITAL_COMMUNITY): Payer: Self-pay | Admitting: Obstetrics & Gynecology

## 2019-04-01 ENCOUNTER — Encounter (INDEPENDENT_AMBULATORY_CARE_PROVIDER_SITE_OTHER): Payer: Self-pay | Admitting: *Deleted

## 2019-04-01 ENCOUNTER — Telehealth (INDEPENDENT_AMBULATORY_CARE_PROVIDER_SITE_OTHER): Payer: Self-pay | Admitting: *Deleted

## 2019-04-01 DIAGNOSIS — D508 Other iron deficiency anemias: Secondary | ICD-10-CM

## 2019-04-01 MED ORDER — SUPREP BOWEL PREP KIT 17.5-3.13-1.6 GM/177ML PO SOLN: 1.0000 | Freq: Once | ORAL | 0 refills | Status: AC

## 2019-04-01 NOTE — Telephone Encounter (Signed)
Patient needs suprep TCS sch'd 10/14

## 2019-04-02 ENCOUNTER — Encounter: Payer: No Typology Code available for payment source | Admitting: Obstetrics & Gynecology

## 2019-04-03 ENCOUNTER — Encounter: Payer: Self-pay | Admitting: Obstetrics & Gynecology

## 2019-04-03 ENCOUNTER — Ambulatory Visit (INDEPENDENT_AMBULATORY_CARE_PROVIDER_SITE_OTHER): Payer: No Typology Code available for payment source | Admitting: Obstetrics & Gynecology

## 2019-04-03 ENCOUNTER — Other Ambulatory Visit: Payer: Self-pay

## 2019-04-03 VITALS — BP 134/87 | HR 73 | Ht 65.0 in | Wt 221.0 lb

## 2019-04-03 DIAGNOSIS — Z9889 Other specified postprocedural states: Secondary | ICD-10-CM | POA: Diagnosis not present

## 2019-04-03 MED ORDER — MEGESTROL ACETATE 40 MG PO TABS: 40.0000 mg | ORAL_TABLET | Freq: Every day | ORAL | 3 refills | Status: DC

## 2019-04-03 NOTE — Progress Notes (Signed)
  HPI: Patient returns for routine postoperative follow-up having undergone hysteroscopy uterine urettage failed ablation on 03/25/2019.  The patient's immediate postoperative recovery has been unremarkable. Since hospital discharge the patient reports no problems.   Current Outpatient Medications: ARIPiprazole (ABILIFY) 5 MG tablet, Take 1 tablet (5 mg total) by mouth daily., Disp: 90 tablet, Rfl: 0 aspirin EC 81 MG tablet, Take 1 tablet (81 mg total) by mouth daily., Disp: 90 tablet, Rfl: 3 buPROPion (WELLBUTRIN XL) 300 MG 24 hr tablet, Take 1 tablet (300 mg total) by mouth daily., Disp: 90 tablet, Rfl: 0 Fe Cbn-Fe Gluc-FA-B12-C-DSS (FERRALET 90) 90-1 MG TABS, Take 1 tablet by mouth daily., Disp: 30 each, Rfl: 11 glipiZIDE (GLUCOTROL XL) 5 MG 24 hr tablet, Take 1 tablet (5 mg total) by mouth daily with breakfast., Disp: 90 tablet, Rfl: 3 HYDROcodone-acetaminophen (NORCO) 5-325 MG tablet, Take one tablet at bedtime by mouth, for chronic back pain (Patient taking differently: Take 1 tablet by mouth at bedtime as needed for moderate pain. ), Disp: 30 tablet, Rfl: 0 ketorolac (TORADOL) 10 MG tablet, Take 1 tablet (10 mg total) by mouth every 8 (eight) hours as needed., Disp: 15 tablet, Rfl: 0 megestrol (MEGACE) 40 MG tablet, Take 1 tablet (40 mg total) by mouth daily., Disp: 30 tablet, Rfl: 3 metoprolol tartrate (LOPRESSOR) 25 MG tablet, TAKE 1 TABLET (25 MG TOTAL) BY MOUTH DAILY., Disp: 90 tablet, Rfl: 1 ondansetron (ZOFRAN ODT) 8 MG disintegrating tablet, Take 1 tablet (8 mg total) by mouth every 8 (eight) hours as needed for nausea or vomiting., Disp: 20 tablet, Rfl: 0 potassium chloride (KLOR-CON 10) 10 MEQ tablet, Take 1 tablet (10 mEq total) by mouth 2 (two) times daily., Disp: 60 tablet, Rfl: 0 rosuvastatin (CRESTOR) 5 MG tablet, Take 1 tablet (5 mg total) by mouth daily., Disp: 90 tablet, Rfl: 3 temazepam (RESTORIL) 30 MG capsule, TAKE 1 CAPSULE BY MOUTH ONCE DAILY AT BEDTIME AS NEEDED FOR  SLEEP (Patient taking differently: Take 30 mg by mouth at bedtime as needed for sleep. ), Disp: 90 capsule, Rfl: 1 triamterene-hydrochlorothiazide (MAXZIDE-25) 37.5-25 MG tablet, Take 1 tablet by mouth daily., Disp: 90 tablet, Rfl: 3 venlafaxine XR (EFFEXOR-XR) 150 MG 24 hr capsule, 225 mg daily (150 mg + 75 mg) (Patient taking differently: Take 150 mg by mouth daily with breakfast. ), Disp: 90 capsule, Rfl: 0 venlafaxine XR (EFFEXOR-XR) 75 MG 24 hr capsule, 225 mg daily (150 mg + 75 mg) (Patient taking differently: Take 75 mg by mouth daily with breakfast. ), Disp: 90 capsule, Rfl: 0  No current facility-administered medications for this visit.     Blood pressure 134/87, pulse 73, height 5\' 5"  (1.651 m), weight 221 lb (100.2 kg).  Physical Exam: Normal post D&C exam  Diagnostic Tests:   Pathology: Benign polypoid endometrium  Impression: S/p hysteroscopy with D&C failed ablation  Plan: Meds ordered this encounter  Medications  . megestrol (MEGACE) 40 MG tablet    Sig: Take 1 tablet (40 mg total) by mouth daily.    Dispense:  30 tablet    Refill:  3     Follow up: 6  weeks  Florian Buff, MD

## 2019-04-13 MED FILL — METOPROLOL TARTRATE 25 MG T: 25 | 90 days supply | Qty: 90 | Fill #0

## 2019-04-20 ENCOUNTER — Other Ambulatory Visit: Payer: Self-pay

## 2019-04-20 MED ORDER — TRIAMTERENE-HCTZ 37.5-25 MG PO TABS: 1.0000 | ORAL_TABLET | Freq: Every day | ORAL | 3 refills | Status: DC

## 2019-04-20 MED ORDER — ROSUVASTATIN CALCIUM 5 MG PO TABS: 5.0000 mg | ORAL_TABLET | Freq: Every day | ORAL | 3 refills | Status: DC

## 2019-04-20 MED ORDER — GLIPIZIDE ER 5 MG PO TB24: 5.0000 mg | ORAL_TABLET | Freq: Every day | ORAL | 3 refills | Status: DC

## 2019-04-20 MED FILL — TRIAMTERENE/HCTZ 37.5/25 TB: 37.5-25 | 90 days supply | Qty: 90 | Fill #0

## 2019-04-20 MED FILL — ROSUVASTATIN CALCIUM 5 MG T: 5 | 90 days supply | Qty: 90 | Fill #0

## 2019-04-20 NOTE — Progress Notes (Signed)
Virtual Visit via Video Note  I connected with Mary Roman on 04/22/19 at  9:00 AM EDT by a video enabled telemedicine application and verified that I am speaking with the correct person using two identifiers.   I discussed the limitations of evaluation and management by telemedicine and the availability of in person appointments. The patient expressed understanding and agreed to proceed.   I discussed the assessment and treatment plan with the patient. The patient was provided an opportunity to ask questions and all were answered. The patient agreed with the plan and demonstrated an understanding of the instructions.   The patient was advised to call back or seek an in-person evaluation if the symptoms worsen or if the condition fails to improve as anticipated.  I provided 15 minutes of non-face-to-face time during this encounter.   [name], MD    [facility] MD/PA/NP OP Progress Note  04/22/2019 9:27 AM Mary Roman  MRN:  [id number]  Chief Complaint:  Chief Complaint    Follow-up; Depression     HPI:  - She had Hysteroscopy, uterine curettage, Failed endometrial ablation using Minerva for menorrhagia  This is a follow-up appointment for depression.  She states that she has had "bad couple of months." She complains of fatigue, hypersomnia, and stays in the bed most of the time when she does not go to work. She feels frustrated that her fiance does not understand her mental illness. He is still unemployed. Although the surgery went well, there was a certain procedure she could not get; she will have follow up appointment. She does not take a walk anymore during the lunchtime as she only has 30 mins lunch time. She has middle insomnia. She has very low energy .  She has anhedonia .  She has difficulty in concentration .  She has occasional passive SI. She feels overwhelmed and anxious. She has decreased appetite to binge eating.    220 lbs Wt Readings from Last 3  Encounters:  04/03/19 221 lb (100.2 kg)  03/20/19 215 lb (97.5 kg)  02/03/19 221 lb 1.9 oz (100.3 kg)    Visit Diagnosis:    ICD-10-CM   1. Major depressive disorder, recurrent episode, moderate (Mary Roman)  F33.1     Past Psychiatric History: Please see initial evaluation for full details. I have reviewed the history. No updates at this time.     Past Medical History:  Past Medical History:  Diagnosis Date  . Anemia   . Anxiety   . Chronic back pain   . Diabetes mellitus, type II ([place])   . Headache(784.0)   . Hypertension   . Obesity   . Palpitation   . Sickle cell trait ([place])   . Thyromegaly     Past Surgical History:  Procedure Laterality Date  . DILATATION AND CURETTAGE/HYSTEROSCOPY WITH MINERVA N/A 03/25/2019   Procedure: DILATATION AND CURETTAGE/HYSTEROSCOPY WITH  ATTEMPTED MINERVA ENDOMETRIAL ABLATION;  Surgeon: Florian Buff, MD;  Location: [facility];  Service: Gynecology;  Laterality: N/A;  . None      Family Psychiatric History: Please see initial evaluation for full details. I have reviewed the history. No updates at this time.     Family History:  Family History  Problem Relation Age of Onset  . Diabetes Mother   . Hypertension Mother   . Stroke Mother   . Colon cancer Mother   . Alcohol abuse Mother   . Cancer - Colon Mother 34  . Pneumonia Father   .  Hypertension Sister   . Anxiety disorder Sister   . Hypertension Sister   . Leukemia Sister   . Diabetes Maternal Grandmother   . Hypertension Maternal Grandmother   . Hypertension Maternal Grandfather   . Heart attack Maternal Uncle     Social History:  Social History   Socioeconomic History  . Marital status: Single    Spouse name: Not on file  . Number of children: 2  . Years of education: Not on file  . Highest education level: Not on file  Occupational History  . 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 Financial resource strain: Not on file  . Food insecurity    Worry:  Not on file    Inability: Not on file  . Transportation needs    Medical: Not on file    Non-medical: Not on file  Tobacco Use  . Smoking status: Never Smoker  . Smokeless tobacco: Never Used  Substance and Sexual Activity  . Alcohol use: No    Comment: 01-17-2017 per pt no  . Drug use: No    Comment: 01-17-2017 per pt no  . Sexual activity: Yes    Partners: Male    Birth control/protection: Other-see comments    Comment: bf had vasectomy  Lifestyle  . Physical activity    Days per week: Not on file    Minutes per session: Not on file  . Stress: Not on file  Relationships  . Social Herbalist on phone: Not on file    Gets together: Not on file    Attends religious service: Not on file    Active member of club or organization: Not on file    Attends meetings of clubs or organizations: Not on file    Relationship status: Not on file  Other Topics Concern  . Not on file  Social History Narrative  . Not on file    Allergies: No Known Allergies  Metabolic Disorder Labs: Lab Results  Component Value Date   HGBA1C 8.4 (H) 03/20/2019   MPG 194.38 03/20/2019   MPG 174.29 01/29/2019   No results found for: PROLACTIN Lab Results  Component Value Date   CHOL 159 01/29/2019   TRIG 56 01/29/2019   HDL 29 (L) 01/29/2019   CHOLHDL 5.5 01/29/2019   VLDL 11 01/29/2019   LDLCALC 119 (H) 01/29/2019   LDLCALC 96 03/06/2018   Lab Results  Component Value Date   TSH 1.23 03/06/2018   TSH 0.95 01/08/2017    Therapeutic Level Labs: No results found for: LITHIUM No results found for: VALPROATE No components found for:  CBMZ  Current Medications: Current Outpatient Medications  Medication Sig Dispense Refill  . ARIPiprazole (ABILIFY) 10 MG tablet Take 1 tablet (10 mg total) by mouth daily. 30 tablet 0  . aspirin EC 81 MG tablet Take 1 tablet (81 mg total) by mouth daily. 90 tablet 3  . buPROPion (WELLBUTRIN XL) 300 MG 24 hr tablet Take 1 tablet (300 mg total) by  mouth daily. 90 tablet 0  . Fe Cbn-Fe Gluc-FA-B12-C-DSS (FERRALET 90) 90-1 MG TABS Take 1 tablet by mouth daily. 30 each 11  . glipiZIDE (GLUCOTROL XL) 5 MG 24 hr tablet Take 1 tablet (5 mg total) by mouth daily with breakfast. 90 tablet 3  . ketorolac (TORADOL) 10 MG tablet Take 1 tablet (10 mg total) by mouth every 8 (eight) hours as needed. 15 tablet 0  . megestrol (MEGACE) 40 MG  tablet Take 1 tablet (40 mg total) by mouth daily. 30 tablet 3  . metoprolol tartrate (LOPRESSOR) 25 MG tablet TAKE 1 TABLET (25 MG TOTAL) BY MOUTH DAILY. 90 tablet 1  . ondansetron (ZOFRAN ODT) 8 MG disintegrating tablet Take 1 tablet (8 mg total) by mouth every 8 (eight) hours as needed for nausea or vomiting. 20 tablet 0  . potassium chloride (KLOR-CON 10) 10 MEQ tablet Take 1 tablet (10 mEq total) by mouth 2 (two) times daily. 60 tablet 0  . rosuvastatin (CRESTOR) 5 MG tablet Take 1 tablet (5 mg total) by mouth daily. 90 tablet 3  . temazepam (RESTORIL) 30 MG capsule TAKE 1 CAPSULE BY MOUTH ONCE DAILY AT BEDTIME AS NEEDED FOR SLEEP (Patient taking differently: Take 30 mg by mouth at bedtime as needed for sleep. ) 90 capsule 1  . triamterene-hydrochlorothiazide (MAXZIDE-25) 37.5-25 MG tablet Take 1 tablet by mouth daily. 90 tablet 3  . venlafaxine XR (EFFEXOR-XR) 150 MG 24 hr capsule 225 mg daily (150 mg + 75 mg) (Patient taking differently: Take 150 mg by mouth daily with breakfast. ) 90 capsule 0  . venlafaxine XR (EFFEXOR-XR) 75 MG 24 hr capsule 225 mg daily (150 mg + 75 mg) (Patient taking differently: Take 75 mg by mouth daily with breakfast. ) 90 capsule 0   No current facility-administered medications for this visit.      Musculoskeletal: Strength & Muscle Tone: N/A Gait & Station: N/A Patient leans: N/A  Psychiatric Specialty Exam: Review of Systems  Psychiatric/Behavioral: Positive for depression and suicidal ideas. Negative for hallucinations, memory loss and substance abuse. The patient is  nervous/anxious and has insomnia.   All other systems reviewed and are negative.   There were no vitals taken for this visit.There is no height or weight on file to calculate BMI.  General Appearance: Fairly Groomed  Eye Contact:  Good  Speech:  Clear and Coherent  Volume:  Normal  Mood:  Anxious and Depressed  Affect:  Appropriate, Congruent, Restricted and down  Thought Process:  Coherent  Orientation:  Full (Time, Place, and Person)  Thought Content: Logical   Suicidal Thoughts:  Yes.  without intent/plan  Homicidal Thoughts:  No  Memory:  Immediate;   Good  Judgement:  Good  Insight:  Fair  Psychomotor Activity:  Normal  Concentration:  Concentration: Good and Attention Span: Good  Recall:  Good  Fund of Knowledge: Good  Language: Good  Akathisia:  No  Handed:  Right  AIMS (if indicated): not done  Assets:  Communication Skills Desire for Improvement  ADL's:  Intact  Cognition: WNL  Sleep:  Poor   Screenings: GAD-7     Counselor from 05/15/2018 in [place] Office Visit from 01/08/2017 in [facility]  Total GAD-7 Score  17  14    PHQ2-9     Office Visit from 02/03/2019 in [facility] Office Visit from 11/06/2018 in [facility] Office Visit from 08/04/2018 in [facility] Office Visit from 05/15/2018 in [facility] Office Visit from 03/06/2018 in [facility]  PHQ-2 Total Score  1  3  2  2  6   PHQ-9 Total Score  -  12  5  10  24        Assessment and Plan:  KAIDEN LICEA is a 48 y.o. year old female with a history of depression, diabetes, who presents for follow up appointment for Major depressive disorder, recurrent episode, moderate (  [place])  # MDD, moderate, recurrent without psychotic features There has been worsening in depressive symptoms and anxiety since her last visit.  Psychosocial stressors includes work, and conflict with her fianc.  Will  uptitrate Abilify as adjunctive treatment for depression.  Discussed potential metabolic side effect and EPS.  Will continue venlafaxine to target depression.  Will continue bupropion as adjunctive treatment for depression.  She has no known history of seizure.  Discussed behavioral activation.  She will greatly benefit from CBT; she is advised to reconnect with her therapist.   # Insomnia She has prominent fatigue, middle insomnia and snores at night.  She has never been evaluated for sleep study.  Referral was made a few months ago; will send out reminder.   Plan I have reviewed and updated plans as below 1. Continue venlafaxine 225 mg daily (150 mg + 75 mg) 2. Continue bupropion 300 mg daily (side effect from higher dose) 3. Increase Abilify 10 mg daily 4. Referral to sleep study (will contact the clinic for reminder) 5.Next appointment: 10/22 at 8:40 for 20 mins, video -temazepam 30 mg at night as needed for sleep -on gabapentin for pain and hydroxyzine  Past trials of medication: fluoxetine,duloxetine,buspirone, Trazodone ("weird" feeling) temazepam, Xanax  The patient demonstrates the following risk factors for suicide: Chronic risk factors for suicide include: psychiatric disorder of depression, anxietyand chronic pain. Acute risk factorsfor suicide include: N/A. Protective factorsfor this patient include: positive social support, responsibility to others (children, family), coping skills and hope for the future. Considering these factors, the overall suicide risk at this point appears to be low. Patient isappropriate for outpatient follow up.   [name], MD 04/22/2019, 9:26 AM

## 2019-04-22 ENCOUNTER — Ambulatory Visit (INDEPENDENT_AMBULATORY_CARE_PROVIDER_SITE_OTHER): Payer: No Typology Code available for payment source | Admitting: Psychiatry

## 2019-04-22 ENCOUNTER — Encounter (HOSPITAL_COMMUNITY): Payer: Self-pay | Admitting: Psychiatry

## 2019-04-22 ENCOUNTER — Other Ambulatory Visit: Payer: Self-pay

## 2019-04-22 ENCOUNTER — Telehealth (HOSPITAL_COMMUNITY): Payer: Self-pay | Admitting: Psychiatry

## 2019-04-22 DIAGNOSIS — F331 Major depressive disorder, recurrent, moderate: Secondary | ICD-10-CM | POA: Diagnosis not present

## 2019-04-22 MED ORDER — ARIPIPRAZOLE 10 MG PO TABS: 10.0000 mg | ORAL_TABLET | Freq: Every day | ORAL | 0 refills | Status: DC

## 2019-04-22 NOTE — Patient Instructions (Signed)
1. Continue venlafaxine 225 mg daily (150 mg + 75 mg) 2. Continue bupropion 300 mg daily  3. Increase Abilify 10 mg daily 4. Referral to sleep study  5.Next appointment: 10/22 at 8:40

## 2019-04-22 NOTE — Telephone Encounter (Signed)
[facility] will be calling her to schedule appt

## 2019-04-22 NOTE — Telephone Encounter (Signed)
I placed a referral to pulmonology clinic for evaluation of sleep apnea in August. She still has not received any call. Could you make sure this referral process? Thanks.

## 2019-05-07 ENCOUNTER — Ambulatory Visit: Payer: No Typology Code available for payment source | Admitting: Family Medicine

## 2019-05-11 ENCOUNTER — Other Ambulatory Visit (HOSPITAL_COMMUNITY): Payer: No Typology Code available for payment source

## 2019-05-13 ENCOUNTER — Other Ambulatory Visit: Payer: Self-pay

## 2019-05-13 ENCOUNTER — Other Ambulatory Visit (HOSPITAL_COMMUNITY)
Admission: RE | Admit: 2019-05-13 | Discharge: 2019-05-13 | Disposition: A | Discharge status: Home / Self Care | Payer: No Typology Code available for payment source | Source: Ambulatory Visit | Attending: Family Medicine | Admitting: Family Medicine

## 2019-05-13 ENCOUNTER — Encounter (HOSPITAL_COMMUNITY): Payer: Self-pay

## 2019-05-13 ENCOUNTER — Encounter: Payer: Self-pay | Admitting: Family Medicine

## 2019-05-13 ENCOUNTER — Ambulatory Visit (HOSPITAL_COMMUNITY): Admit: 2019-05-13 | Payer: No Typology Code available for payment source | Admitting: Internal Medicine

## 2019-05-13 ENCOUNTER — Ambulatory Visit (INDEPENDENT_AMBULATORY_CARE_PROVIDER_SITE_OTHER): Payer: No Typology Code available for payment source | Admitting: Family Medicine

## 2019-05-13 VITALS — BP 122/84 | HR 78 | Temp 97.5°F | Resp 15 | Ht 65.0 in | Wt 212.0 lb

## 2019-05-13 DIAGNOSIS — E669 Obesity, unspecified: Secondary | ICD-10-CM

## 2019-05-13 DIAGNOSIS — E8881 Metabolic syndrome: Secondary | ICD-10-CM

## 2019-05-13 DIAGNOSIS — I10 Essential (primary) hypertension: Secondary | ICD-10-CM | POA: Diagnosis not present

## 2019-05-13 DIAGNOSIS — E1169 Type 2 diabetes mellitus with other specified complication: Secondary | ICD-10-CM | POA: Insufficient documentation

## 2019-05-13 DIAGNOSIS — E785 Hyperlipidemia, unspecified: Secondary | ICD-10-CM | POA: Insufficient documentation

## 2019-05-13 DIAGNOSIS — R52 Pain, unspecified: Secondary | ICD-10-CM

## 2019-05-13 DIAGNOSIS — E1159 Type 2 diabetes mellitus with other circulatory complications: Secondary | ICD-10-CM

## 2019-05-13 LAB — COMPREHENSIVE METABOLIC PANEL
ALT: 20 U/L (ref 0–44)
AST: 15 U/L (ref 15–41)
Albumin: 4 g/dL (ref 3.5–5.0)
Alkaline Phosphatase: 72 U/L (ref 38–126)
Anion gap: 14 (ref 5–15)
BUN: 11 mg/dL (ref 6–20)
CO2: 22 mmol/L (ref 22–32)
Calcium: 9.3 mg/dL (ref 8.9–10.3)
Chloride: 101 mmol/L (ref 98–111)
Creatinine, Ser: 1 mg/dL (ref 0.44–1.00)
GFR calc Af Amer: >60 mL/min (ref >60)
GFR calc non Af Amer: >60 mL/min (ref >60)
Glucose, Bld: 222 mg/dL — ABNORMAL HIGH (ref 70–99)
Potassium: 3.7 mmol/L (ref 3.5–5.1)
Sodium: 137 mmol/L (ref 135–145)
Total Bilirubin: 0.6 mg/dL (ref 0.3–1.2)
Total Protein: 7.9 g/dL (ref 6.5–8.1)

## 2019-05-13 LAB — CBC
HCT: 40.4 % (ref 36.0–46.0)
Hemoglobin: 12.3 g/dL (ref 12.0–15.0)
MCH: 26.1 pg (ref 26.0–34.0)
MCHC: 30.4 g/dL (ref 30.0–36.0)
MCV: 85.8 fL (ref 80.0–100.0)
Platelets: 466 10*3/uL — ABNORMAL HIGH (ref 150–400)
RBC: 4.71 MIL/uL (ref 3.87–5.11)
RDW: 19.1 % — ABNORMAL HIGH (ref 11.5–15.5)
WBC: 8 10*3/uL (ref 4.0–10.5)
nRBC: 0 % (ref 0.0–0.2)

## 2019-05-13 LAB — LIPID PANEL
Cholesterol: 150 mg/dL (ref 0–200)
HDL: 29 mg/dL — ABNORMAL LOW (ref >40)
LDL Cholesterol: 106 mg/dL — ABNORMAL HIGH (ref 0–99)
Total CHOL/HDL Ratio: 5.2 RATIO
Triglycerides: 75 mg/dL (ref <150)
VLDL: 15 mg/dL (ref 0–40)

## 2019-05-13 LAB — TSH: TSH: 1.916 u[IU]/mL (ref 0.350–4.500)

## 2019-05-13 SURGERY — COLONOSCOPY
Anesthesia: Moderate Sedation

## 2019-05-13 MED ORDER — HYDROCODONE-ACETAMINOPHEN 5-325 MG PO TABS: ORAL_TABLET | ORAL | 0 refills | Status: AC

## 2019-05-13 MED ORDER — GLIPIZIDE ER 10 MG PO TB24: 10.0000 mg | ORAL_TABLET | Freq: Every day | ORAL | 1 refills | Status: DC

## 2019-05-13 MED FILL — glipiZIDE ER 10 MG TB24: 10 | 90 days supply | Qty: 90 | Fill #0

## 2019-05-13 NOTE — Patient Instructions (Addendum)
F/u I office with MD first week in December , re eval blood sugar, call if you need me before  Increased dose of glipizide to  10 mg once daily  You are referred for diabetic education, important to go   Goal for fasting blood sugar ranges from 80 to 120 and 2 hours after any meal or at bedtime should be between 130 to 170.  Please senf d once wekly blood sugar values  Via Myy Chart  Today at hospital cmp and EGFr, cBC, TSH and lipid profile  and microalb  Nov 23 or after HBA1C and chem 7 and EGFr   It is important that you exercise regularly at least 30 minutes 5 times a week. If you develop chest pain, have severe difficulty breathing, or feel very tired, stop exercising immediately and seek medical attention

## 2019-05-14 LAB — MICROALBUMIN / CREATININE URINE RATIO
Creatinine, Urine: 306.5 mg/dL
Microalb Creat Ratio: 12 mg/g creat (ref 0–29)
Microalb, Ur: 37.3 ug/mL — ABNORMAL HIGH

## 2019-05-14 NOTE — Progress Notes (Signed)
Virtual Visit via Video Note  I connected with Mary Roman on 05/21/19 at  8:40 AM EDT by a video enabled telemedicine application and verified that I am speaking with the correct person using two identifiers.   I discussed the limitations of evaluation and management by telemedicine and the availability of in person appointments. The patient expressed understanding and agreed to proceed.     I discussed the assessment and treatment plan with the patient. The patient was provided an opportunity to ask questions and all were answered. The patient agreed with the plan and demonstrated an understanding of the instructions.   The patient was advised to call back or seek an in-person evaluation if the symptoms worsen or if the condition fails to improve as anticipated.  I provided 15 minutes of non-face-to-face time during this encounter.   [name], MD    [facility] MD/PA/NP OP Progress Note  05/21/2019 9:08 AM Mary MORELES  MRN:  [id number]  Chief Complaint:  Chief Complaint    Depression; Follow-up     HPI:  This is a follow-up appointment for depression.  She states that she has been out of medication for about a month due to financial strain.  She was able to restart all of her medication last week.  She feels exhausted and feels "awful" while she continues to go to work. Her fiance is still unemployed, although he is trying to get one and has been trying to be more supportive. She does not want to go outside as she tends to feel irritable and does not want to be around with people. She feels slightly better when she is with her fiance. She used to enjoy going shopping and crafting, although she has not done this due to anhedonia. She was in the bed all day on last weekend.  She has hypersomnia.  She has very low energy.  She has difficulty concentration.  She has passive SI, although she denies any plan or intent.  She feels anxious and tense, being worried about "what if."  She has occasional panic attacks.    Visit Diagnosis:    ICD-10-CM   1. Major depressive disorder, recurrent episode, moderate (HCC)  F33.1   2. Insomnia, unspecified type  G47.00     Past Psychiatric History: Please see initial evaluation for full details. I have reviewed the history. No updates at this time.     Past Medical History:  Past Medical History:  Diagnosis Date  . Anemia   . Anxiety   . Chronic back pain   . Diabetes mellitus, type II ([place])   . Headache(784.0)   . Hypertension   . Obesity   . Palpitation   . Sickle cell trait ([place])   . Thyromegaly     Past Surgical History:  Procedure Laterality Date  . DILATATION AND CURETTAGE/HYSTEROSCOPY WITH MINERVA N/A 03/25/2019   Procedure: DILATATION AND CURETTAGE/HYSTEROSCOPY WITH  ATTEMPTED MINERVA ENDOMETRIAL ABLATION;  Surgeon: Florian Buff, MD;  Location: [facility];  Service: Gynecology;  Laterality: N/A;  . None      Family Psychiatric History: Please see initial evaluation for full details. I have reviewed the history. No updates at this time.     Family History:  Family History  Problem Relation Age of Onset  . Diabetes Mother   . Hypertension Mother   . Stroke Mother   . Colon cancer Mother   . Alcohol abuse Mother   . Cancer - Colon Mother 36  .  Pneumonia Father   . Hypertension Sister   . Anxiety disorder Sister   . Hypertension Sister   . Leukemia Sister   . Diabetes Maternal Grandmother   . Hypertension Maternal Grandmother   . Hypertension Maternal Grandfather   . Heart attack Maternal Uncle     Social History:  Social History   Socioeconomic History  . Marital status: Single    Spouse name: Not on file  . Number of children: 2  . Years of education: Not on file  . Highest education level: Not on file  Occupational History  . 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 Financial resource strain: Not on file  . Food insecurity    Worry: Not on file    Inability: Not  on file  . Transportation needs    Medical: Not on file    Non-medical: Not on file  Tobacco Use  . Smoking status: Never Smoker  . Smokeless tobacco: Never Used  Substance and Sexual Activity  . Alcohol use: No    Comment: 01-17-2017 per pt no  . Drug use: No    Comment: 01-17-2017 per pt no  . Sexual activity: Yes    Partners: Male    Birth control/protection: Other-see comments    Comment: bf had vasectomy  Lifestyle  . Physical activity    Days per week: Not on file    Minutes per session: Not on file  . Stress: Not on file  Relationships  . Social Herbalist on phone: Not on file    Gets together: Not on file    Attends religious service: Not on file    Active member of club or organization: Not on file    Attends meetings of clubs or organizations: Not on file    Relationship status: Not on file  Other Topics Concern  . Not on file  Social History Narrative  . Not on file    Allergies: No Known Allergies  Metabolic Disorder Labs: Lab Results  Component Value Date   HGBA1C 8.4 (H) 03/20/2019   MPG 194.38 03/20/2019   MPG 174.29 01/29/2019   No results found for: PROLACTIN Lab Results  Component Value Date   CHOL 150 05/13/2019   TRIG 75 05/13/2019   HDL 29 (L) 05/13/2019   CHOLHDL 5.2 05/13/2019   VLDL 15 05/13/2019   LDLCALC 106 (H) 05/13/2019   LDLCALC 119 (H) 01/29/2019   Lab Results  Component Value Date   TSH 1.916 05/13/2019   TSH 1.23 03/06/2018    Therapeutic Level Labs: No results found for: LITHIUM No results found for: VALPROATE No components found for:  CBMZ  Current Medications: Current Outpatient Medications  Medication Sig Dispense Refill  . ARIPiprazole (ABILIFY) 10 MG tablet Take 1 tablet (10 mg total) by mouth daily. 90 tablet 0  . aspirin EC 81 MG tablet Take 1 tablet (81 mg total) by mouth daily. 90 tablet 3  . [START ON 06/23/2019] buPROPion (WELLBUTRIN XL) 300 MG 24 hr tablet Take 1 tablet (300 mg total) by  mouth daily. 90 tablet 0  . Fe Cbn-Fe Gluc-FA-B12-C-DSS (FERRALET 90) 90-1 MG TABS Take 1 tablet by mouth daily. 30 each 11  . glipiZIDE (GLUCOTROL XL) 10 MG 24 hr tablet Take 1 tablet (10 mg total) by mouth daily with breakfast. 90 tablet 1  . HYDROcodone-acetaminophen (NORCO/VICODIN) 5-325 MG tablet Take one tablet at bedtime for back pain , by mouth 30 tablet 0  . [  START ON 06/12/2019] HYDROcodone-acetaminophen (NORCO/VICODIN) 5-325 MG tablet Take one tablet by mouth once daily for pain 30 tablet 0  . [START ON 07/12/2019] HYDROcodone-acetaminophen (NORCO/VICODIN) 5-325 MG tablet Take one tablet by mouth once daily for pain 30 tablet 0  . ketorolac (TORADOL) 10 MG tablet Take 1 tablet (10 mg total) by mouth every 8 (eight) hours as needed. 15 tablet 0  . megestrol (MEGACE) 40 MG tablet Take 1 tablet (40 mg total) by mouth daily. 30 tablet 3  . metoprolol tartrate (LOPRESSOR) 25 MG tablet TAKE 1 TABLET (25 MG TOTAL) BY MOUTH DAILY. 90 tablet 1  . ondansetron (ZOFRAN ODT) 8 MG disintegrating tablet Take 1 tablet (8 mg total) by mouth every 8 (eight) hours as needed for nausea or vomiting. 20 tablet 0  . potassium chloride (KLOR-CON 10) 10 MEQ tablet Take 1 tablet (10 mEq total) by mouth 2 (two) times daily. 60 tablet 0  . rosuvastatin (CRESTOR) 5 MG tablet Take 1 tablet (5 mg total) by mouth daily. 90 tablet 3  . temazepam (RESTORIL) 30 MG capsule TAKE 1 CAPSULE BY MOUTH ONCE DAILY AT BEDTIME AS NEEDED FOR SLEEP (Patient taking differently: Take 30 mg by mouth at bedtime as needed for sleep. ) 90 capsule 1  . [START ON 06/03/2019] temazepam (RESTORIL) 30 MG capsule Take 1 capsule (30 mg total) by mouth at bedtime. 30 capsule 5  . triamterene-hydrochlorothiazide (MAXZIDE-25) 37.5-25 MG tablet Take 1 tablet by mouth daily. 90 tablet 3  . venlafaxine XR (EFFEXOR-XR) 150 MG 24 hr capsule 225 mg daily (150 mg + 75 mg) 90 capsule 2  . venlafaxine XR (EFFEXOR-XR) 75 MG 24 hr capsule 225 mg daily (150 mg +  75 mg) 90 capsule 0   No current facility-administered medications for this visit.      Musculoskeletal: Strength & Muscle Tone: N/A Gait & Station: N/A Patient leans: N/A  Psychiatric Specialty Exam: Review of Systems  Psychiatric/Behavioral: Positive for depression and suicidal ideas. Negative for hallucinations, memory loss and substance abuse. The patient is nervous/anxious and has insomnia.   All other systems reviewed and are negative.   There were no vitals taken for this visit.There is no height or weight on file to calculate BMI.  General Appearance: Fairly Groomed  Eye Contact:  Good  Speech:  Clear and Coherent  Volume:  Normal  Mood:  Depressed  Affect:  Appropriate, Congruent and Restricted  Thought Process:  Coherent  Orientation:  Full (Time, Place, and Person)  Thought Content: Logical   Suicidal Thoughts:  Yes.  without intent/plan  Homicidal Thoughts:  No  Memory:  Immediate;   Good  Judgement:  Good  Insight:  Present  Psychomotor Activity:  Normal  Concentration:  Concentration: Good and Attention Span: Good  Recall:  Good  Fund of Knowledge: Good  Language: Good  Akathisia:  No  Handed:  Right  AIMS (if indicated): not done  Assets:  Communication Skills Desire for Improvement  ADL's:  Intact  Cognition: WNL  Sleep:  hypersomnia   Screenings: GAD-7     Counselor from 05/15/2018 in [facility] Office Visit from 01/08/2017 in [facility]  Total GAD-7 Score  17  14    PHQ2-9     Office Visit from 05/13/2019 in [facility] Office Visit from 02/03/2019 in [facility] Office Visit from 11/06/2018 in [facility] Office Visit from 08/04/2018 in [facility] Office Visit from 05/15/2018 in [facility]  PHQ-2 Total Score  4  1  3  2  2   PHQ-9 Total Score  13  -  12  5  10        Assessment and Plan:  Mary Roman is a 48 y.o. year old  female with a history of depression, diabetes, who presents for follow up appointment for Major depressive disorder, recurrent episode, moderate (HCC)  Insomnia, unspecified type  # MDD, moderate, recurrent without psychotic features She continues to report depressive symptoms and anxiety, in the context of non adherence to medication due to financial strain.  Psychosocial stressors include work and conflict with her fianc.  Given she recently reinitiated her medication, will not make any adjustments to her medication at this time.  We will continue venlafaxine to target depression and anxiety.  Will continue bupropion as adjunctive treatment for depression.  She has no known history of seizure.  We will continue Abilify as adjunctive treatment for depression.  Discussed potential metabolic side effects and EPS.  Coached behavioral activation.  She agrees to try crafting, and going outside with her fianc on the weekend.  She will greatly benefit from CBT; she has an upcoming appointment with her therapist.   # Insomnia She has prominent fatigue, middle insomnia and snores at night.  She has never been evaluated for sleep study.  According to the patient, she still does not hear back from anybody about her appointment.  We will send a reminder to the clinic again.   Plan I have reviewed and updated plans as below 1.Continuevenlafaxine 225 mg daily (150 mg + 75 mg) 2. Continue bupropion 300 mg daily (side effect from higher dose) 3. Continue Abilify 10 mg daily 4. Referral to sleep study (will contact the clinic for reminder again) 5.Next appointment: in December -temazepam 30 mg at night as needed for sleep -on gabapentin for pain and hydroxyzine  Past trials of medication: fluoxetine,duloxetine,buspirone, Trazodone ("weird" feeling) temazepam, Xanax  The patient demonstrates the following risk factors for suicide: Chronic risk factors for suicide include: psychiatric disorder of  depression, anxietyand chronic pain. Acute risk factorsfor suicide include: N/A. Protective factorsfor this patient include: positive social support, responsibility to others (children, family), coping skills and hope for the future. Considering these factors, the overall suicide risk at this point appears to be low. Patient isappropriate for outpatient follow up.  [name], MD 05/21/2019, 9:08 AM

## 2019-05-15 ENCOUNTER — Encounter: Payer: No Typology Code available for payment source | Admitting: Obstetrics & Gynecology

## 2019-05-15 MED FILL — ARIPiprazole 10 MG TABS: 10 | 30 days supply | Qty: 30 | Fill #0

## 2019-05-15 MED FILL — buPROPion HCL ER (XL) 300 M: 300 | 90 days supply | Qty: 90 | Fill #0

## 2019-05-17 ENCOUNTER — Encounter: Payer: Self-pay | Admitting: Family Medicine

## 2019-05-17 MED ORDER — HYDROCODONE-ACETAMINOPHEN 5-325 MG PO TABS: ORAL_TABLET | ORAL | 0 refills | Status: AC

## 2019-05-17 MED ORDER — TEMAZEPAM 30 MG PO CAPS: 30.0000 mg | ORAL_CAPSULE | Freq: Every day | ORAL | 5 refills | Status: DC

## 2019-05-17 MED ORDER — HYDROCODONE-ACETAMINOPHEN 5-325 MG PO TABS: ORAL_TABLET | ORAL | 0 refills | Status: DC

## 2019-05-17 NOTE — Assessment & Plan Note (Signed)
Hyperlipidemia:Low fat diet discussed and encouraged.   Lipid Panel  Lab Results  Component Value Date   CHOL 150 05/13/2019   HDL 29 (L) 05/13/2019   LDLCALC 106 (H) 05/13/2019   TRIG 75 05/13/2019   CHOLHDL 5.2 05/13/2019   Needs to lower fat intake and increase exercise

## 2019-05-17 NOTE — Progress Notes (Signed)
Mary Roman     MRN: [id number]      DOB: July 30, 1971   HPI Mary Roman is here for follow up and re-evaluation of chronic medical conditions, medication management and review of any available recent lab and radiology data.  Preventive health is updated, specifically  Cancer screening and Immunization.   Questions or concerns regarding consultations or procedures which the PT has had in the interim are  addressed. The PT denies any adverse reactions to current medications since the last visit.  There are no new concerns.  There are no specific complaints   ROS Denies recent fever or chills. Denies sinus pressure, nasal congestion, ear pain or sore throat. Denies chest congestion, productive cough or wheezing. Denies chest pains, palpitations and leg swelling Denies abdominal pain, nausea, vomiting,diarrhea or constipation.   Denies dysuria, frequency, hesitancy or incontinence. Denies joint pain, swelling and limitation in mobility. Denies headaches, seizures, numbness, or tingling. C/o  depression, anxiety and insomnia.Beingg treated by Psychiatry every  Denies skin break down or rash.   PE  BP 122/84   Pulse 78   Temp (!) 97.5 F (36.4 C) (Temporal)   Resp 15   Ht 5\' 5"  (1.651 m)   Wt 212 lb (96.2 kg)   SpO2 96%   BMI 35.28 kg/m   Patient alert and oriented and in no cardiopulmonary distress.  HEENT: No facial asymmetry, EOMI,     Neck supple .  Chest: Clear to auscultation bilaterally.  CVS: S1, S2 no murmurs, no S3.Regular rate.  ABD: Soft non tender.   Ext: No edema  MS: decreased  ROM spine, adequate In shoulders, hips and knees.  Skin: Intact, no ulcerations or rash noted.  Psych: Good eye contact, normal affect. Memory intact not anxious or depressed appearing.  CNS: CN 2-12 intact, power,  normal throughout.no focal deficits noted.   Assessment & Plan  Essential hypertension Controlled, no change in medication DASH diet and commitment  to daily physical activity for a minimum of 30 minutes discussed and encouraged, as a part of hypertension management. The importance of attaining a healthy weight is also discussed.  BP/Weight 05/13/2019 04/03/2019 03/25/2019 03/20/2019 02/03/2019 01/08/2019 [id number]  Systolic BP 123XX123 Q000111Q Q000111Q 123XX123 0000000 [id number]  Diastolic BP 84 87 80 73 80 83 85  Wt. (Lbs) 212 221 - 215 221.12 226 -  BMI 35.28 36.78 - 35.78 36.8 37.61 38.27  Some encounter information is confidential and restricted. Go to Review Flowsheets activity to see all data.       Type 2 diabetes mellitus with vascular disease (HCC) Uncontrolled, pt to increase glipizde dose and follow diet , also to test regularly and send in values Mary Roman is reminded of the importance of commitment to daily physical activity for 30 minutes or more, as able and the need to limit carbohydrate intake to 30 to 60 grams per meal to help with blood sugar control.   The need to take medication as prescribed, test blood sugar as directed, and to call between visits if there is a concern that blood sugar is uncontrolled is also discussed.   Mary Roman is reminded of the importance of daily foot exam, annual eye examination, and good blood sugar, blood pressure and cholesterol control.  Diabetic Labs Latest Ref Rng & Units 05/13/2019 03/20/2019 01/29/2019 03/06/2018 01/24/2018  HbA1c 4.8 - 5.6 % - 8.4(H) 7.7(H) 6.8(H) -  Microalbumin Not Estab. ug/mL 37.3(H) - - 1.7 13.9(H)  Micro/Creat Ratio 0 -  29 mg/g creat 12 - - 10 9.0  Chol 0 - 200 mg/dL 150 - 159 154 -  HDL >40 mg/dL 29(L) - 29(L) 45(L) -  Calc LDL 0 - 99 mg/dL 106(H) - 119(H) 96 -  Triglycerides <150 mg/dL 75 - 56 50 -  Creatinine 0.44 - 1.00 mg/dL 1.00 1.01(H) 0.99 0.95 -   BP/Weight 05/13/2019 04/03/2019 03/25/2019 03/20/2019 02/03/2019 01/08/2019 123456  Systolic BP 123XX123 Q000111Q Q000111Q 123XX123 0000000 [id number]  Diastolic BP 84 87 80 73 80 83 85  Wt. (Lbs) 212 221 - 215 221.12 226 -  BMI 35.28 36.78 - 35.78 36.8  37.61 38.27  Some encounter information is confidential and restricted. Go to Review Flowsheets activity to see all data.   Foot/eye exam completion dates Latest Ref Rng & Units 02/03/2019 01/23/2018  Eye Exam No Retinopathy - No Retinopathy  Foot Form Completion - Done -        Morbid obesity ([place]) Obesity linked with hypertension and diabetes .  Patient re-educated about  the importance of commitment to a  minimum of 150 minutes of exercise per week as able.  The importance of healthy food choices with portion control discussed, as well as eating regularly and within a 12 hour window most days. The need to choose "clean , green" food 50 to 75% of the time is discussed, as well as to make water the primary drink and set a goal of 64 ounces water daily.    Weight /BMI 05/13/2019 04/03/2019 03/20/2019  WEIGHT 212 lb 221 lb 215 lb  HEIGHT 5\' 5"  5\' 5"  5\' 5"   BMI 35.28 kg/m2 36.78 kg/m2 35.78 kg/m2  Some encounter information is confidential and restricted. Go to Review Flowsheets activity to see all data.      Dyslipidemia (high LDL; low HDL) Hyperlipidemia:Low fat diet discussed and encouraged.   Lipid Panel  Lab Results  Component Value Date   CHOL 150 05/13/2019   HDL 29 (L) 05/13/2019   LDLCALC 106 (H) 05/13/2019   TRIG 75 05/13/2019   CHOLHDL 5.2 05/13/2019   Needs to lower fat intake and increase exercise    Encounter for pain management The patient's Controlled Substance registry is reviewed and compliance confirmed. Adequacy of  Pain control and level of function is assessed. Medication dosing is adjusted as deemed appropriate. Twelve weeks of medication is prescribed , patient signs for the script and is provided with a follow up appointment between 11 to 12 weeks .

## 2019-05-17 NOTE — Assessment & Plan Note (Signed)
Uncontrolled, pt to increase glipizde dose and follow diet , also to test regularly and send in values Mary Roman is reminded of the importance of commitment to daily physical activity for 30 minutes or more, as able and the need to limit carbohydrate intake to 30 to 60 grams per meal to help with blood sugar control.   The need to take medication as prescribed, test blood sugar as directed, and to call between visits if there is a concern that blood sugar is uncontrolled is also discussed.   Mary Roman is reminded of the importance of daily foot exam, annual eye examination, and good blood sugar, blood pressure and cholesterol control.  Diabetic Labs Latest Ref Rng & Units 05/13/2019 03/20/2019 01/29/2019 03/06/2018 01/24/2018  HbA1c 4.8 - 5.6 % - 8.4(H) 7.7(H) 6.8(H) -  Microalbumin Not Estab. ug/mL 37.3(H) - - 1.7 13.9(H)  Micro/Creat Ratio 0 - 29 mg/g creat 12 - - 10 9.0  Chol 0 - 200 mg/dL 150 - 159 154 -  HDL >40 mg/dL 29(L) - 29(L) 45(L) -  Calc LDL 0 - 99 mg/dL 106(H) - 119(H) 96 -  Triglycerides <150 mg/dL 75 - 56 50 -  Creatinine 0.44 - 1.00 mg/dL 1.00 1.01(H) 0.99 0.95 -   BP/Weight 05/13/2019 04/03/2019 03/25/2019 03/20/2019 02/03/2019 01/08/2019 123456  Systolic BP 123XX123 Q000111Q Q000111Q 123XX123 0000000 123456 [id number]  Diastolic BP 84 87 80 73 80 83 85  Wt. (Lbs) 212 221 - 215 221.12 226 -  BMI 35.28 36.78 - 35.78 36.8 37.61 38.27  Some encounter information is confidential and restricted. Go to Review Flowsheets activity to see all data.   Foot/eye exam completion dates Latest Ref Rng & Units 02/03/2019 01/23/2018  Eye Exam No Retinopathy - No Retinopathy  Foot Form Completion - Done -

## 2019-05-17 NOTE — Assessment & Plan Note (Signed)
The patient's Controlled Substance registry is reviewed and compliance confirmed. Adequacy of  Pain control and level of function is assessed. Medication dosing is adjusted as deemed appropriate. Twelve weeks of medication is prescribed , patient signs for the script and is provided with a follow up appointment between 11 to 12 weeks .  

## 2019-05-17 NOTE — Assessment & Plan Note (Signed)
Controlled, no change in medication DASH diet and commitment to daily physical activity for a minimum of 30 minutes discussed and encouraged, as a part of hypertension management. The importance of attaining a healthy weight is also discussed.  BP/Weight 05/13/2019 04/03/2019 03/25/2019 03/20/2019 02/03/2019 01/08/2019 123456  Systolic BP 123XX123 Q000111Q Q000111Q 123XX123 0000000 123456 [id number]  Diastolic BP 84 87 80 73 80 83 85  Wt. (Lbs) 212 221 - 215 221.12 226 -  BMI 35.28 36.78 - 35.78 36.8 37.61 38.27  Some encounter information is confidential and restricted. Go to Review Flowsheets activity to see all data.

## 2019-05-17 NOTE — Assessment & Plan Note (Signed)
Obesity linked with hypertension and diabetes .  Patient re-educated about  the importance of commitment to a  minimum of 150 minutes of exercise per week as able.  The importance of healthy food choices with portion control discussed, as well as eating regularly and within a 12 hour window most days. The need to choose "clean , green" food 50 to 75% of the time is discussed, as well as to make water the primary drink and set a goal of 64 ounces water daily.    Weight /BMI 05/13/2019 04/03/2019 03/20/2019  WEIGHT 212 lb 221 lb 215 lb  HEIGHT 5\' 5"  5\' 5"  5\' 5"   BMI 35.28 kg/m2 36.78 kg/m2 35.78 kg/m2  Some encounter information is confidential and restricted. Go to Review Flowsheets activity to see all data.

## 2019-05-18 ENCOUNTER — Other Ambulatory Visit (HOSPITAL_COMMUNITY): Payer: Self-pay | Admitting: Psychiatry

## 2019-05-18 ENCOUNTER — Telehealth (HOSPITAL_COMMUNITY): Payer: Self-pay | Admitting: Psychiatry

## 2019-05-18 MED ORDER — VENLAFAXINE HCL ER 150 MG PO CP24: ORAL_CAPSULE | ORAL | 2 refills | Status: DC

## 2019-05-18 MED FILL — VENLAFAXINE HCL ER 150 MG C: 150 | 90 days supply | Qty: 90 | Fill #0

## 2019-05-18 NOTE — Telephone Encounter (Signed)
SPOKE WITH PATIENT & DISCUSSED:  Received a request of refill of venlafaxine 150 mg daily at [facility] outpatient pharmacy. Could you ask the patient which pharmacy she prefers to use- the last order was sent to [facility] per her request. Please have the order transferred to the pharmacy she wants to use  [facility] DID NOT FILL SCRIPT PER PATIENT INSURANCE PLAN ALL REOCCURRING MED'S NED TO BE CONSISTENTLY FILLED [facility]

## 2019-05-18 NOTE — Telephone Encounter (Signed)
Ordered

## 2019-05-18 NOTE — Telephone Encounter (Signed)
Received a request of refill of venlafaxine 150 mg daily at [facility]. Could you ask the patient which pharmacy she prefers to use- the last order was sent to [facility] per her request. Please have the order transferred to the pharmacy she wants to use.

## 2019-05-21 ENCOUNTER — Telehealth (HOSPITAL_COMMUNITY): Payer: Self-pay | Admitting: Psychiatry

## 2019-05-21 ENCOUNTER — Other Ambulatory Visit: Payer: Self-pay

## 2019-05-21 ENCOUNTER — Encounter (HOSPITAL_COMMUNITY): Payer: Self-pay | Admitting: Psychiatry

## 2019-05-21 ENCOUNTER — Ambulatory Visit (INDEPENDENT_AMBULATORY_CARE_PROVIDER_SITE_OTHER): Payer: No Typology Code available for payment source | Admitting: Psychiatry

## 2019-05-21 DIAGNOSIS — F331 Major depressive disorder, recurrent, moderate: Secondary | ICD-10-CM

## 2019-05-21 DIAGNOSIS — G4700 Insomnia, unspecified: Secondary | ICD-10-CM | POA: Diagnosis not present

## 2019-05-21 MED ORDER — ARIPIPRAZOLE 10 MG PO TABS: 10.0000 mg | ORAL_TABLET | Freq: Every day | ORAL | 0 refills | Status: DC

## 2019-05-21 MED ORDER — VENLAFAXINE HCL ER 75 MG PO CP24: ORAL_CAPSULE | ORAL | 0 refills | Status: DC

## 2019-05-21 MED ORDER — BUPROPION HCL ER (XL) 300 MG PO TB24: 300.0000 mg | ORAL_TABLET | Freq: Every day | ORAL | 0 refills | Status: DC

## 2019-05-21 MED FILL — VENLAFAXINE HCL ER 75 MG CA: 75 | 90 days supply | Qty: 90 | Fill #0

## 2019-05-21 NOTE — Patient Instructions (Signed)
1.Continuevenlafaxine 225 mg daily (150 mg + 75 mg) 2. Continue bupropion 300 mg daily 3. Continue Abilify 10 mg daily 4. Referral to sleep study 5.Next appointment: in December  CONTACT INFORMATION  What to do if you need to get in touch with someone regarding a psychiatric issue:  1. EMERGENCY: For psychiatric emergencies (if you are suicidal or if there are any other safety issues) call 911 and/or go to your nearest Emergency Room immediately.   2. IF YOU NEED SOMEONE TO TALK TO RIGHT NOW: Given my clinical responsibilities, I may not be able to speak with you over the phone for a prolonged period of time.  a. You may always call The National Suicide Prevention Lifeline at 1-800-273-TALK 208-360-4852).  b. Your county of residence will also have local crisis services. For [facility]: [place] at [phone] ([place])

## 2019-05-21 NOTE — Telephone Encounter (Signed)
THE CLINIC LVM  WITH CONTACT INFORMATION FOR RETURN CALL

## 2019-05-21 NOTE — Telephone Encounter (Signed)
Could you contact pulmonary clinic again as a reminder about the referral (referral was made in August 2020 for sleep apnea). She has not received any call back for her evaluation.

## 2019-05-21 NOTE — Telephone Encounter (Signed)
Patient was left a message °

## 2019-05-22 ENCOUNTER — Ambulatory Visit (HOSPITAL_COMMUNITY): Payer: No Typology Code available for payment source | Admitting: Psychiatry

## 2019-05-27 ENCOUNTER — Other Ambulatory Visit: Payer: Self-pay

## 2019-05-27 ENCOUNTER — Ambulatory Visit (INDEPENDENT_AMBULATORY_CARE_PROVIDER_SITE_OTHER): Payer: No Typology Code available for payment source | Admitting: Psychiatry

## 2019-05-27 DIAGNOSIS — F331 Major depressive disorder, recurrent, moderate: Secondary | ICD-10-CM

## 2019-05-27 NOTE — Progress Notes (Signed)
Virtual Visit via Video Note  I connected with Mary Roman on 05/27/19 at 8:10 AM EDT by a video enabled telemedicine application and verified that I am speaking with the correct person using two identifiers.   I discussed the limitations of evaluation and management by telemedicine and the availability of in person appointments. The patient expressed understanding and agreed to proceed.  I provided 43 minutes of non-face-to-face time during this encounter.   [name], LCSW    THERAPIST PROGRESS NOTE  Session Time: Wednesday 05/27/2019  8:10 AM - 8:53 AM   Participation Level: Active  Behavioral Response: CasualAlert/Depressed/Anxious/  Type of Therapy: Individual Therapy  Treatment Goals addressed:  Learn and implement calming strategies to reduce/manage overall anxiety.                                                             Learn and implement behavioral strategies to overcome depression.  Interventions: CBT and Supportive  Summary: Mary Roman is a 48 y.o. female who presents with a history of symptoms of depression that initially began seveal years ago after her mother had a stroke. Symptoms have waxed and waned since that time. She and one of her siblings assumed most of the caretaker responsibilities for her mother. Symptoms worsened as her sibling who had helped her with mother moved to [place] and no longer is as available to assist patient with their mother's care. Patient also has multiple other stressors. Her symptoms include low energy depressed mood, poor concentration, feelings of hopelessness, irritability, changes in appetite, sleep difficulty, feelings of worthlessness, tearfulness, anxiety, muscle tension, excessive worrying.  Patient last was seen in March 2020.  She reports increased depressed mood, isolated behaviors, crying spells, irritability, difficulty falling and staying asleep, and thoughts of hopelessness.  She reports multiple  stressors including financial difficulty.  Mary Roman currently is is unemployed and is looking for work.  He has been more supportive regarding helping her around the house per patient's report.  She reports she was unable to purchase her medication due to financial difficulty and was without it for about 3 weeks.  She has resumed medication.  She reports additional stress regarding concerns about her children and virtual school.  Her son is a Equities trader and has lots of demands regarding his last year in high school.  Patient reports increased negative thoughts about self and her ability to provide her family.  She reports having thoughts of wishing she was dead about a week or 2 ago but denies any plan or intent to harm self.  Patient reports little to no involvement in activity besides work.  During the weekends, she often stays in her room in the bed.    Suicidal/Homicidal: Nowithout intent/plan, Patient agrees to call this practice, call 911, or have someone take her to the ER should symptoms worsen.   Therapist Response: praised and reinforced patient's efforts in resuming therapy, reviewed symptoms, discussed stressors, facilitated expression of thoughts and feelings, validated feelings, assisted patient identify triggers of current episode, assisted patient examine her thought patterns and behaviors, assisted patient identify value congruent behaviors to increase behavioral activation, developed plan with patient to engage in a craft activity with daughter for 1 hour on Saturdays and to visit her mother with her daughter in a  safe way on Sundays, reviewed the role of self-care in coping with depression, assisted patient identify ways to improve self-care regarding eating patterns and exercise  Plan: Return again in 1- 2 weeks.   Diagnosis: Axis I: MDD    Axis II: Deferred    [name], LCSW 05/27/2019

## 2019-05-29 ENCOUNTER — Encounter: Payer: Self-pay | Admitting: Obstetrics & Gynecology

## 2019-05-29 ENCOUNTER — Other Ambulatory Visit: Payer: Self-pay

## 2019-05-29 ENCOUNTER — Ambulatory Visit (INDEPENDENT_AMBULATORY_CARE_PROVIDER_SITE_OTHER): Payer: No Typology Code available for payment source | Admitting: Obstetrics & Gynecology

## 2019-05-29 VITALS — BP 124/82 | HR 82 | Ht 65.0 in | Wt 215.0 lb

## 2019-05-29 DIAGNOSIS — N921 Excessive and frequent menstruation with irregular cycle: Secondary | ICD-10-CM

## 2019-05-29 NOTE — Progress Notes (Signed)
Chief Complaint  Patient presents with  . Routine Post Op      48 y.o. [id number] No LMP recorded. The current method of family planning is none.  Outpatient Encounter Medications as of 05/29/2019  Medication Sig Note  . ARIPiprazole (ABILIFY) 10 MG tablet Take 1 tablet (10 mg total) by mouth daily.   Marland Kitchen aspirin EC 81 MG tablet Take 1 tablet (81 mg total) by mouth daily. 03/18/2019: Has not started yet  . [START ON 06/23/2019] buPROPion (WELLBUTRIN XL) 300 MG 24 hr tablet Take 1 tablet (300 mg total) by mouth daily.   Marland Kitchen glipiZIDE (GLUCOTROL XL) 10 MG 24 hr tablet Take 1 tablet (10 mg total) by mouth daily with breakfast.   . HYDROcodone-acetaminophen (NORCO/VICODIN) 5-325 MG tablet Take one tablet at bedtime for back pain , by mouth   . megestrol (MEGACE) 40 MG tablet Take 1 tablet (40 mg total) by mouth daily.   . metoprolol tartrate (LOPRESSOR) 25 MG tablet TAKE 1 TABLET (25 MG TOTAL) BY MOUTH DAILY.   Marland Kitchen potassium chloride (KLOR-CON 10) 10 MEQ tablet Take 1 tablet (10 mEq total) by mouth 2 (two) times daily.   . rosuvastatin (CRESTOR) 5 MG tablet Take 1 tablet (5 mg total) by mouth daily.   . temazepam (RESTORIL) 30 MG capsule TAKE 1 CAPSULE BY MOUTH ONCE DAILY AT BEDTIME AS NEEDED FOR SLEEP (Patient taking differently: Take 30 mg by mouth at bedtime as needed for sleep. )   . [START ON 06/03/2019] temazepam (RESTORIL) 30 MG capsule Take 1 capsule (30 mg total) by mouth at bedtime.   . triamterene-hydrochlorothiazide (MAXZIDE-25) 37.5-25 MG tablet Take 1 tablet by mouth daily.   Marland Kitchen venlafaxine XR (EFFEXOR-XR) 150 MG 24 hr capsule 225 mg daily (150 mg + 75 mg)   . venlafaxine XR (EFFEXOR-XR) 75 MG 24 hr capsule 225 mg daily (150 mg + 75 mg)   . [START ON 06/12/2019] HYDROcodone-acetaminophen (NORCO/VICODIN) 5-325 MG tablet Take one tablet by mouth once daily for pain   . [START ON 07/12/2019] HYDROcodone-acetaminophen (NORCO/VICODIN) 5-325 MG tablet Take one tablet by mouth once daily  for pain   . [DISCONTINUED] Fe Cbn-Fe Gluc-FA-B12-C-DSS (FERRALET 90) 90-1 MG TABS Take 1 tablet by mouth daily.   . [DISCONTINUED] ferrous sulfate 325 (65 FE) MG EC tablet Take 325 mg by mouth 3 (three) times daily with meals.   . [DISCONTINUED] ketorolac (TORADOL) 10 MG tablet Take 1 tablet (10 mg total) by mouth every 8 (eight) hours as needed.   . [DISCONTINUED] ondansetron (ZOFRAN ODT) 8 MG disintegrating tablet Take 1 tablet (8 mg total) by mouth every 8 (eight) hours as needed for nausea or vomiting.    No facility-administered encounter medications on file as of 05/29/2019.     Subjective Pt is s/p d&c failed ablation on megestrol chronically  No bleeding  No complaints on the megestrol Would like to stay on it Past Medical History:  Diagnosis Date  . Anemia   . Anxiety   . Chronic back pain   . Diabetes mellitus, type II ([place])   . Headache(784.0)   . Hypertension   . Obesity   . Palpitation   . Sickle cell trait ([place])   . Thyromegaly     Past Surgical History:  Procedure Laterality Date  . DILATATION AND CURETTAGE/HYSTEROSCOPY WITH MINERVA N/A 03/25/2019   Procedure: DILATATION AND CURETTAGE/HYSTEROSCOPY WITH  ATTEMPTED MINERVA ENDOMETRIAL ABLATION;  Surgeon: Florian Buff, MD;  Location: [facility];  Service: Gynecology;  Laterality: N/A;  . None      OB History    Gravida  2   Para  2   Term  2   Preterm      AB      Living  2     SAB      TAB      Ectopic      Multiple      Live Births              No Known Allergies  Social History   Socioeconomic History  . Marital status: Single    Spouse name: Not on file  . Number of children: 2  . Years of education: Not on file  . Highest education level: Not on file  Occupational History  . 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 Financial resource strain: Not on file  . Food insecurity    Worry: Not on file    Inability: Not on file  . Transportation needs    Medical:  Not on file    Non-medical: Not on file  Tobacco Use  . Smoking status: Never Smoker  . Smokeless tobacco: Never Used  Substance and Sexual Activity  . Alcohol use: No    Comment: 01-17-2017 per pt no  . Drug use: No    Comment: 01-17-2017 per pt no  . Sexual activity: Yes    Partners: Male    Birth control/protection: Other-see comments    Comment: bf had vasectomy  Lifestyle  . Physical activity    Days per week: Not on file    Minutes per session: Not on file  . Stress: Not on file  Relationships  . Social Herbalist on phone: Not on file    Gets together: Not on file    Attends religious service: Not on file    Active member of club or organization: Not on file    Attends meetings of clubs or organizations: Not on file    Relationship status: Not on file  Other Topics Concern  . Not on file  Social History Narrative  . Not on file    Family History  Problem Relation Age of Onset  . Diabetes Mother   . Hypertension Mother   . Stroke Mother   . Colon cancer Mother   . Alcohol abuse Mother   . Cancer - Colon Mother 79  . Pneumonia Father   . Hypertension Sister   . Anxiety disorder Sister   . Hypertension Sister   . Leukemia Sister   . Diabetes Maternal Grandmother   . Hypertension Maternal Grandmother   . Hypertension Maternal Grandfather   . Heart attack Maternal Uncle     Medications:       Current Outpatient Medications:  .  ARIPiprazole (ABILIFY) 10 MG tablet, Take 1 tablet (10 mg total) by mouth daily., Disp: 90 tablet, Rfl: 0 .  aspirin EC 81 MG tablet, Take 1 tablet (81 mg total) by mouth daily., Disp: 90 tablet, Rfl: 3 .  [START ON 06/23/2019] buPROPion (WELLBUTRIN XL) 300 MG 24 hr tablet, Take 1 tablet (300 mg total) by mouth daily., Disp: 90 tablet, Rfl: 0 .  glipiZIDE (GLUCOTROL XL) 10 MG 24 hr tablet, Take 1 tablet (10 mg total) by mouth daily with breakfast., Disp: 90 tablet, Rfl: 1 .  HYDROcodone-acetaminophen (NORCO/VICODIN) 5-325 MG  tablet, Take one tablet at bedtime for back pain , by  mouth, Disp: 30 tablet, Rfl: 0 .  megestrol (MEGACE) 40 MG tablet, Take 1 tablet (40 mg total) by mouth daily., Disp: 30 tablet, Rfl: 3 .  metoprolol tartrate (LOPRESSOR) 25 MG tablet, TAKE 1 TABLET (25 MG TOTAL) BY MOUTH DAILY., Disp: 90 tablet, Rfl: 1 .  potassium chloride (KLOR-CON 10) 10 MEQ tablet, Take 1 tablet (10 mEq total) by mouth 2 (two) times daily., Disp: 60 tablet, Rfl: 0 .  rosuvastatin (CRESTOR) 5 MG tablet, Take 1 tablet (5 mg total) by mouth daily., Disp: 90 tablet, Rfl: 3 .  temazepam (RESTORIL) 30 MG capsule, TAKE 1 CAPSULE BY MOUTH ONCE DAILY AT BEDTIME AS NEEDED FOR SLEEP (Patient taking differently: Take 30 mg by mouth at bedtime as needed for sleep. ), Disp: 90 capsule, Rfl: 1 .  [START ON 06/03/2019] temazepam (RESTORIL) 30 MG capsule, Take 1 capsule (30 mg total) by mouth at bedtime., Disp: 30 capsule, Rfl: 5 .  triamterene-hydrochlorothiazide (MAXZIDE-25) 37.5-25 MG tablet, Take 1 tablet by mouth daily., Disp: 90 tablet, Rfl: 3 .  venlafaxine XR (EFFEXOR-XR) 150 MG 24 hr capsule, 225 mg daily (150 mg + 75 mg), Disp: 90 capsule, Rfl: 2 .  venlafaxine XR (EFFEXOR-XR) 75 MG 24 hr capsule, 225 mg daily (150 mg + 75 mg), Disp: 90 capsule, Rfl: 0 .  [START ON 06/12/2019] HYDROcodone-acetaminophen (NORCO/VICODIN) 5-325 MG tablet, Take one tablet by mouth once daily for pain, Disp: 30 tablet, Rfl: 0 .  [START ON 07/12/2019] HYDROcodone-acetaminophen (NORCO/VICODIN) 5-325 MG tablet, Take one tablet by mouth once daily for pain, Disp: 30 tablet, Rfl: 0  Objective Blood pressure 124/82, pulse 82, height 5\' 5"  (1.651 m), weight 215 lb (97.5 kg).  General WDWN female NAD Vulva:  normal appearing vulva with no masses, tenderness or lesions Vagina:  normal mucosa, no discharge Cervix:  Normal no lesions Uterus:  normal size, contour, position, consistency, mobility, non-tender Adnexa: ovaries:present,  normal adnexa in size,  nontender and no masses   Pertinent ROS No burning with urination, frequency or urgency No nausea, vomiting or diarrhea Nor fever chills or other constitutional symptoms   Labs or studies     Impression Diagnoses this Encounter::   ICD-10-CM   1. Menometrorrhagia  N92.1    amenorrhea on megestrol    Established relevant diagnosis(es):   Plan/Recommendations: No orders of the defined types were placed in this encounter.   Labs or Scans Ordered: No orders of the defined types were placed in this encounter.   Management:: >continue on the megestrol as that is keeping  Her amenorrheic  Will consider an every 3 month withdrawal, stop December 1-8 then restart  Follow up No follow-ups on file.           All questions were answered.

## 2019-06-05 ENCOUNTER — Ambulatory Visit (HOSPITAL_COMMUNITY): Payer: No Typology Code available for payment source | Admitting: Psychiatry

## 2019-06-19 ENCOUNTER — Ambulatory Visit (INDEPENDENT_AMBULATORY_CARE_PROVIDER_SITE_OTHER): Payer: No Typology Code available for payment source | Admitting: Psychiatry

## 2019-06-19 ENCOUNTER — Other Ambulatory Visit: Payer: Self-pay

## 2019-06-19 DIAGNOSIS — F331 Major depressive disorder, recurrent, moderate: Secondary | ICD-10-CM | POA: Diagnosis not present

## 2019-06-19 NOTE — Progress Notes (Signed)
Virtual Visit via Video Note  I connected with Mary Roman on 06/19/19 at 9:10 AM  by a video enabled telemedicine application and verified that I am speaking with the correct person using two identifiers.   I discussed the limitations of evaluation and management by telemedicine and the availability of in person appointments. The patient expressed understanding and agreed to proceed.    I provided 20  minutes of non-face-to-face time during this encounter.   [name], LCSW    THERAPIST PROGRESS NOTE  Session Time: Friday 06/19/2019 9:10 AM - 9:30 AM   Participation Level: Active  Behavioral Response: CasualAlert/Depressed/Anxious/  Type of Therapy: Individual Therapy  Treatment Goals addressed:  Learn and implement calming strategies to reduce/manage overall anxiety.                                                             Learn and implement behavioral strategies to overcome depression.  Interventions: CBT and Supportive  Summary: Mary Roman is a 48 y.o. female who presents with a history of symptoms of depression that initially began seveal years ago after her mother had a stroke. Symptoms have waxed and waned since that time. She and one of her siblings assumed most of the caretaker responsibilities for her mother. Symptoms worsened as her sibling who had helped her with mother moved to [place] and no longer is as available to assist patient with their mother's care. Patient also has multiple other stressors. Her symptoms include low energy depressed mood, poor concentration, feelings of hopelessness, irritability, changes in appetite, sleep difficulty, feelings of worthlessness, tearfulness, anxiety, muscle tension, excessive worrying.  Patient last was seen 2 weeks ago via virtual visit.  She reports grief and loss issues as her 72 year old niece died suddenly of a heart attack on 06/08/2019.  Patient and her sister plan to go to [place] tomorrow to  be with the family and participate in mourning rituals.  Patient reports sadness, tearfulness, ruminating thoughts, and increased sleep difficulty waking up frequently during the night.  She also reports stress  about the upcoming holidays especially Christmas as she worries about finances related to trying to buy gifts for her children.  She reports additional stress regarding job as employer is short staffed and patient is working additional hours.  She has to leave session early today due to increased work demands.  She reports poor eating patterns.   Suicidal/Homicidal: Nowithout intent/plan, Patient agrees to call this practice, call 911, or have someone take her to the ER should symptoms worsen.   Therapist Response: Reviewed symptoms, discussed stressors, facilitated expression of thoughts and feelings, validated feelings related to grief, discussed approaching feelings rather than avoidance, assisted patient identify ways to use support system, discussed grounding techniques to use to manage distressful feelings, discussed rationale for and assisted patient practice mindfulness activity using breath awareness, assisted patient identify ways to prioritize her worries using front/back burner technique, discussed ways to improve self-care regarding eating patterns  Plan: Return again in 1- 2 weeks.   Diagnosis: Axis I: MDD    Axis II: Deferred    [name], LCSW 06/19/2019

## 2019-06-30 ENCOUNTER — Institutional Professional Consult (permissible substitution): Payer: No Typology Code available for payment source | Admitting: Pulmonary Disease

## 2019-07-03 ENCOUNTER — Other Ambulatory Visit: Payer: Self-pay

## 2019-07-03 ENCOUNTER — Telehealth (HOSPITAL_COMMUNITY): Payer: Self-pay | Admitting: Psychiatry

## 2019-07-03 ENCOUNTER — Ambulatory Visit (HOSPITAL_COMMUNITY): Payer: No Typology Code available for payment source | Admitting: Psychiatry

## 2019-07-03 NOTE — Telephone Encounter (Signed)
Therapist attempted to contact patient twice via text through [email or web address] platform, no response.  Therapist called patient and received voice message indicating call could not be completed as dialed.

## 2019-07-06 ENCOUNTER — Other Ambulatory Visit (HOSPITAL_COMMUNITY): Payer: Self-pay | Admitting: Family Medicine

## 2019-07-06 DIAGNOSIS — Z1231 Encounter for screening mammogram for malignant neoplasm of breast: Secondary | ICD-10-CM

## 2019-07-15 ENCOUNTER — Ambulatory Visit (HOSPITAL_COMMUNITY)
Admission: RE | Admit: 2019-07-15 | Discharge: 2019-07-15 | Disposition: A | Discharge status: Home / Self Care | Payer: No Typology Code available for payment source | Source: Ambulatory Visit | Attending: Family Medicine | Admitting: Family Medicine

## 2019-07-15 ENCOUNTER — Other Ambulatory Visit: Payer: Self-pay

## 2019-07-15 DIAGNOSIS — Z1231 Encounter for screening mammogram for malignant neoplasm of breast: Secondary | ICD-10-CM | POA: Insufficient documentation

## 2019-07-17 ENCOUNTER — Ambulatory Visit (HOSPITAL_COMMUNITY): Payer: No Typology Code available for payment source | Admitting: Psychiatry

## 2019-07-21 ENCOUNTER — Ambulatory Visit (HOSPITAL_COMMUNITY): Payer: No Typology Code available for payment source | Admitting: Psychiatry

## 2019-07-27 ENCOUNTER — Ambulatory Visit: Payer: No Typology Code available for payment source | Admitting: Family Medicine

## 2019-08-04 ENCOUNTER — Ambulatory Visit (INDEPENDENT_AMBULATORY_CARE_PROVIDER_SITE_OTHER): Payer: No Typology Code available for payment source | Admitting: Family Medicine

## 2019-08-04 ENCOUNTER — Encounter: Payer: Self-pay | Admitting: Family Medicine

## 2019-08-04 ENCOUNTER — Other Ambulatory Visit: Payer: Self-pay

## 2019-08-04 VITALS — BP 124/82 | Ht 65.0 in | Wt 215.0 lb

## 2019-08-04 DIAGNOSIS — E1159 Type 2 diabetes mellitus with other circulatory complications: Secondary | ICD-10-CM | POA: Diagnosis not present

## 2019-08-04 DIAGNOSIS — F5104 Psychophysiologic insomnia: Secondary | ICD-10-CM | POA: Diagnosis not present

## 2019-08-04 DIAGNOSIS — F331 Major depressive disorder, recurrent, moderate: Secondary | ICD-10-CM | POA: Diagnosis not present

## 2019-08-04 DIAGNOSIS — I10 Essential (primary) hypertension: Secondary | ICD-10-CM | POA: Diagnosis not present

## 2019-08-04 DIAGNOSIS — E785 Hyperlipidemia, unspecified: Secondary | ICD-10-CM

## 2019-08-04 DIAGNOSIS — R52 Pain, unspecified: Secondary | ICD-10-CM

## 2019-08-04 NOTE — Assessment & Plan Note (Signed)
Inadequately treated, I recommend Psych to manage medication, pt to contact me with dayte of upcoming Psych appt

## 2019-08-04 NOTE — Assessment & Plan Note (Signed)
Uncontrolled, updated lab needed and is past due, will address med management once available

## 2019-08-04 NOTE — Progress Notes (Signed)
Virtual Visit via Telephone Note  I connected with Mary Roman on 08/04/19 at  8:20 AM EST by telephone and verified that I am speaking with the correct person using two identifiers.  Location: Patient: home  Provider: office   I discussed the limitations, risks, security and privacy concerns of performing an evaluation and management service by telephone and the availability of in person appointments. I also discussed with the patient that there may be a patient responsible charge related to this service. The patient expressed understanding and agreed to proceed.   History of Present Illness:  F/u chronic health conditions, including pain management Not testing blood sugar , not exercising, c/o poor sleep for over 3 months, on average 4 to 5 hours/ day Uncontrolled/ inadequately treated depression, not suicidal or homicidal Denies recent fever or chills. Denies sinus pressure, nasal congestion, ear pain or sore throat. Denies chest congestion, productive cough or wheezing. Denies chest pains, palpitations and leg swelling Denies abdominal pain, nausea, vomiting,diarrhea or constipation.   Denies dysuria, frequency, hesitancy or incontinence. Denies uncontrolled joint pain, swelling and limitation in mobility. Denies headaches, seizures, numbness, or tingling.  Denies skin break down or rash.     Observations/Objective:  BP 124/82   Ht 5\' 5"  (1.651 m)   Wt 215 lb (97.5 kg)   BMI 35.78 kg/m  Good communication with no confusion and intact memory. Alert and oriented x 3 No signs of respiratory distress during speech   Assessment and Plan: Major depressive disorder, recurrent episode, moderate (HCC) PHQ 9 score of 13 on 08/04/2019, not suicidal / homicidal. Will message  Her therapist, she is to contact us re appt info with Psych  Insomnia Inadequately treated, I recommend Psych to manage medication, pt to contact me with dayte of upcoming Psych appt  Essential  hypertension Controlled, no change in medication DASH diet and commitment to daily physical activity for a minimum of 30 minutes discussed and encouraged, as a part of hypertension management. The importance of attaining a healthy weight is also discussed.  BP/Weight 08/04/2019 05/29/2019 05/13/2019 04/03/2019 03/25/2019 [id number]  Systolic BP [id number] [id number] Q000111Q [id number]  Diastolic BP 82 82 84 87 80 73 80  Wt. (Lbs) 215 215 212 221 - 215 221.12  BMI 35.78 35.78 35.28 36.78 - 35.78 36.8  Some encounter information is confidential and restricted. Go to Review Flowsheets activity to see all data.       Dyslipidemia (high LDL; low HDL) Hyperlipidemia:Low fat diet discussed and encouraged.   Lipid Panel  Lab Results  Component Value Date   CHOL 150 05/13/2019   HDL 29 (L) 05/13/2019   LDLCALC 106 (H) 05/13/2019   TRIG 75 05/13/2019   CHOLHDL 5.2 05/13/2019   Not at goal. Needs to reduce fried and fatty foods   Morbid obesity ([place])  Patient re-educated about  the importance of commitment to a  minimum of 150 minutes of exercise per week as able.  The importance of healthy food choices with portion control discussed, as well as eating regularly and within a 12 hour window most days. The need to choose "clean , green" food 50 to 75% of the time is discussed, as well as to make water the primary drink and set a goal of 64 ounces water daily.    Weight /BMI 08/04/2019 05/29/2019 05/13/2019  WEIGHT 215 lb 215 lb 212 lb  HEIGHT 5\' 5"  5\' 5"  5\' 5"   BMI 35.78 kg/m2 35.78 kg/m2 35.28 kg/m2  Some encounter information is confidential and restricted. Go to Review Flowsheets activity to see all data.  Obesity linked with hypertension, diabetes, depression    Type 2 diabetes mellitus with vascular disease ([place]) Uncontrolled, updated lab needed and is past due, will address med management once available  Encounter for pain management Medication use reviewed. Next script needed on  08/12/2019. Holding script until pt has her diabetes lab updated, she is  aware    Follow Up Instructions:    I discussed the assessment and treatment plan with the patient. The patient was provided an opportunity to ask questions and all were answered. The patient agreed with the plan and demonstrated an understanding of the instructions.   The patient was advised to call back or seek an in-person evaluation if the symptoms worsen or if the condition fails to improve as anticipated.  I provided 22 minutes of non-face-to-face time during this encounter.   Tula Nakayama, MD

## 2019-08-04 NOTE — Patient Instructions (Addendum)
F/U in office with MD in 13 weeks, call if you need me before  Please get in the hospital this week, non fasting hBA1C, chem 7 and eGFr  Please send message re next Psychiatry appointment and next therapy appointment  Pain medication will be sent AFTER I see your labs, this is not due till 08/12/2019 , so please get labs this week, no later than Thursday  It is important that you exercise regularly at least 30 minutes 5 times a week. If you develop chest pain, have severe difficulty breathing, or feel very tired, stop exercising immediately and seek medical attention  Think about what you will eat, plan ahead. Choose " clean, green, fresh or frozen" over canned, processed or packaged foods which are more sugary, salty and fatty. 70 to 75% of food eaten should be vegetables and fruit. Three meals at set times with snacks allowed between meals, but they must be fruit or vegetables. Aim to eat over a 12 hour period , example 7 am to 7 pm, and STOP after  your last meal of the day. Drink water,generally about 64 ounces per day, no other drink is as healthy. Fruit juice is best enjoyed in a healthy way, by EATING the fruit.  BEST for 2021!

## 2019-08-04 NOTE — Assessment & Plan Note (Signed)
PHQ 9 score of 13 on 08/04/2019, not suicidal / homicidal. Will message  Her therapist, she is to contact us re appt info with Psych

## 2019-08-04 NOTE — Assessment & Plan Note (Signed)
Hyperlipidemia:Low fat diet discussed and encouraged.   Lipid Panel  Lab Results  Component Value Date   CHOL 150 05/13/2019   HDL 29 (L) 05/13/2019   LDLCALC 106 (H) 05/13/2019   TRIG 75 05/13/2019   CHOLHDL 5.2 05/13/2019   Not at goal. Needs to reduce fried and fatty foods

## 2019-08-04 NOTE — Assessment & Plan Note (Signed)
  Patient re-educated about  the importance of commitment to a  minimum of 150 minutes of exercise per week as able.  The importance of healthy food choices with portion control discussed, as well as eating regularly and within a 12 hour window most days. The need to choose "clean , green" food 50 to 75% of the time is discussed, as well as to make water the primary drink and set a goal of 64 ounces water daily.    Weight /BMI 08/04/2019 05/29/2019 05/13/2019  WEIGHT 215 lb 215 lb 212 lb  HEIGHT 5\' 5"  5\' 5"  5\' 5"   BMI 35.78 kg/m2 35.78 kg/m2 35.28 kg/m2  Some encounter information is confidential and restricted. Go to Review Flowsheets activity to see all data.  Obesity linked with hypertension, diabetes, depression

## 2019-08-04 NOTE — Assessment & Plan Note (Signed)
Medication use reviewed. Next script needed on 08/12/2019. Holding script until pt has her diabetes lab updated, she is  aware

## 2019-08-04 NOTE — Assessment & Plan Note (Signed)
Controlled, no change in medication DASH diet and commitment to daily physical activity for a minimum of 30 minutes discussed and encouraged, as a part of hypertension management. The importance of attaining a healthy weight is also discussed.  BP/Weight 08/04/2019 05/29/2019 05/13/2019 04/03/2019 03/25/2019 Q000111Q [id number]  Systolic BP A999333 A999333 123XX123 Q000111Q Q000111Q 123XX123 0000000  Diastolic BP 82 82 84 87 80 73 80  Wt. (Lbs) 215 215 212 221 - 215 221.12  BMI 35.78 35.78 35.28 36.78 - 35.78 36.8  Some encounter information is confidential and restricted. Go to Review Flowsheets activity to see all data.

## 2019-08-06 ENCOUNTER — Other Ambulatory Visit: Payer: Self-pay | Admitting: Family Medicine

## 2019-08-06 ENCOUNTER — Telehealth: Payer: Self-pay

## 2019-08-06 ENCOUNTER — Other Ambulatory Visit (HOSPITAL_COMMUNITY)
Admission: RE | Admit: 2019-08-06 | Discharge: 2019-08-06 | Disposition: A | Discharge status: Home / Self Care | Payer: No Typology Code available for payment source | Source: Ambulatory Visit | Attending: Family Medicine | Admitting: Family Medicine

## 2019-08-06 ENCOUNTER — Other Ambulatory Visit: Payer: Self-pay

## 2019-08-06 DIAGNOSIS — E1159 Type 2 diabetes mellitus with other circulatory complications: Secondary | ICD-10-CM | POA: Insufficient documentation

## 2019-08-06 LAB — BASIC METABOLIC PANEL
Anion gap: 9 (ref 5–15)
BUN: 9 mg/dL (ref 6–20)
CO2: 26 mmol/L (ref 22–32)
Calcium: 9.2 mg/dL (ref 8.9–10.3)
Chloride: 103 mmol/L (ref 98–111)
Creatinine, Ser: 0.94 mg/dL (ref 0.44–1.00)
GFR calc Af Amer: >60 mL/min (ref >60)
GFR calc non Af Amer: >60 mL/min (ref >60)
Glucose, Bld: 252 mg/dL — ABNORMAL HIGH (ref 70–99)
Potassium: 3.5 mmol/L (ref 3.5–5.1)
Sodium: 138 mmol/L (ref 135–145)

## 2019-08-06 MED ORDER — HYDROCODONE-ACETAMINOPHEN 5-325 MG PO TABS: ORAL_TABLET | ORAL | 0 refills | Status: AC

## 2019-08-06 MED ORDER — TEMAZEPAM 30 MG PO CAPS: 30.0000 mg | ORAL_CAPSULE | Freq: Every evening | ORAL | 5 refills | Status: DC | PRN

## 2019-08-06 NOTE — Telephone Encounter (Signed)
Lab cancelled a1c lab so message left for patient to have it redrawn

## 2019-08-07 ENCOUNTER — Ambulatory Visit (INDEPENDENT_AMBULATORY_CARE_PROVIDER_SITE_OTHER): Payer: No Typology Code available for payment source | Admitting: Psychiatry

## 2019-08-07 ENCOUNTER — Other Ambulatory Visit: Payer: Self-pay

## 2019-08-07 ENCOUNTER — Encounter: Payer: Self-pay | Admitting: Psychiatry

## 2019-08-07 DIAGNOSIS — G4700 Insomnia, unspecified: Secondary | ICD-10-CM

## 2019-08-07 DIAGNOSIS — F3342 Major depressive disorder, recurrent, in full remission: Secondary | ICD-10-CM | POA: Diagnosis not present

## 2019-08-07 MED ORDER — ARIPIPRAZOLE 10 MG PO TABS: 10.0000 mg | ORAL_TABLET | Freq: Every day | ORAL | 0 refills | Status: DC

## 2019-08-07 MED ORDER — BUPROPION HCL ER (XL) 300 MG PO TB24: 300.0000 mg | ORAL_TABLET | Freq: Every day | ORAL | 0 refills | Status: DC

## 2019-08-07 MED ORDER — VENLAFAXINE HCL ER 75 MG PO CP24: ORAL_CAPSULE | ORAL | 0 refills | Status: DC

## 2019-08-07 MED ORDER — VENLAFAXINE HCL ER 150 MG PO CP24: ORAL_CAPSULE | ORAL | 0 refills | Status: DC

## 2019-08-07 MED FILL — BUPROPION HCL ER (XL) 300 M: 300 | 90 days supply | Qty: 90 | Fill #0

## 2019-08-07 MED FILL — VENLAFAXINE HCL ER 150 MG C: 150 | 90 days supply | Qty: 90 | Fill #0

## 2019-08-07 MED FILL — ARIPiprazole 10 MG TABS: 10 | 90 days supply | Qty: 90 | Fill #0

## 2019-08-07 MED FILL — VENLAFAXINE HCL ER 75 MG CA: 75 | 90 days supply | Qty: 90 | Fill #0

## 2019-08-07 NOTE — Progress Notes (Signed)
[facility] MD OP Progress Note  I connected with  Mary Roman on 08/07/19 by a video enabled telemedicine application and verified that I am speaking with the correct person using two identifiers.   I discussed the limitations of evaluation and management by telemedicine. The patient expressed understanding and agreed to proceed.    08/07/2019 8:50 AM Mary Roman  MRN:  [id number]  Chief Complaint: " I am doing fine, but am still having difficulty with staying asleep."  HPI: Patient stated that her mood has been stable and overall things are going well.  However she is continuing to have difficulty staying asleep.  She has been taking temazepam 30 mg at bedtime regularly.  Patient was asked regarding the referral for sleep study that was done in October by Dr. Donald Siva.  She stated that she needs to reschedule the appointment for her sleep study.  Patient was encouraged to contact the sleep study office so that the study can be completed and we can have right diagnosis to help her better with her insomnia. Patient stated that in the meantime she does not mind continuing the same medication regimen.  Visit Diagnosis:    ICD-10-CM   1. MDD (major depressive disorder), recurrent, in full remission ([place])  F33.42   2. Insomnia, unspecified type  G47.00     Past Psychiatric History: depression, insomnia  Past Medical History:  Past Medical History:  Diagnosis Date  . Anemia   . Anxiety   . Chronic back pain   . Diabetes mellitus, type II ([place])   . Headache(784.0)   . Hypertension   . Obesity   . Palpitation   . Sickle cell trait ([place])   . Thyromegaly     Past Surgical History:  Procedure Laterality Date  . DILATATION AND CURETTAGE/HYSTEROSCOPY WITH MINERVA N/A 03/25/2019   Procedure: DILATATION AND CURETTAGE/HYSTEROSCOPY WITH  ATTEMPTED MINERVA ENDOMETRIAL ABLATION;  Surgeon: Florian Buff, MD;  Location: [facility];  Service: Gynecology;  Laterality: N/A;  . None      Family  Psychiatric History: see below  Family History:  Family History  Problem Relation Age of Onset  . Diabetes Mother   . Hypertension Mother   . Stroke Mother   . Colon cancer Mother   . Alcohol abuse Mother   . Cancer - Colon Mother 36  . Pneumonia Father   . Hypertension Sister   . Anxiety disorder Sister   . Hypertension Sister   . Leukemia Sister   . Diabetes Maternal Grandmother   . Hypertension Maternal Grandmother   . Hypertension Maternal Grandfather   . Heart attack Maternal Uncle     Social History:  Social History   Socioeconomic History  . Marital status: Single    Spouse name: Not on file  . Number of children: 2  . Years of education: Not on file  . Highest education level: Not on file  Occupational History  . Occupation: employed in medical office   Tobacco Use  . Smoking status: Never Smoker  . Smokeless tobacco: Never Used  Substance and Sexual Activity  . Alcohol use: No    Comment: 01-17-2017 per pt no  . Drug use: No    Comment: 01-17-2017 per pt no  . Sexual activity: Yes    Partners: Male    Birth control/protection: Other-see comments    Comment: bf had vasectomy  Other Topics Concern  . Not on file  Social History Narrative  . Not on file  Social Determinants of Health   Financial Resource Strain:   . Difficulty of Paying Living Expenses: Not on file  Food Insecurity:   . Worried About Charity fundraiser in the Last Year: Not on file  . Ran Out of Food in the Last Year: Not on file  Transportation Needs:   . Lack of Transportation (Medical): Not on file  . Lack of Transportation (Non-Medical): Not on file  Physical Activity:   . Days of Exercise per Week: Not on file  . Minutes of Exercise per Session: Not on file  Stress:   . Feeling of Stress : Not on file  Social Connections:   . Frequency of Communication with Friends and Family: Not on file  . Frequency of Social Gatherings with Friends and Family: Not on file  . Attends  Religious Services: Not on file  . Active Member of Clubs or Organizations: Not on file  . Attends Archivist Meetings: Not on file  . Marital Status: Not on file    Allergies: No Known Allergies  Metabolic Disorder Labs: Lab Results  Component Value Date   HGBA1C 8.4 (H) 03/20/2019   MPG 194.38 03/20/2019   MPG 174.29 01/29/2019   No results found for: PROLACTIN Lab Results  Component Value Date   CHOL 150 05/13/2019   TRIG 75 05/13/2019   HDL 29 (L) 05/13/2019   CHOLHDL 5.2 05/13/2019   VLDL 15 05/13/2019   LDLCALC 106 (H) 05/13/2019   LDLCALC 119 (H) 01/29/2019   Lab Results  Component Value Date   TSH 1.916 05/13/2019   TSH 1.23 03/06/2018    Therapeutic Level Labs: No results found for: LITHIUM No results found for: VALPROATE No components found for:  CBMZ  Current Medications: Current Outpatient Medications  Medication Sig Dispense Refill  . ARIPiprazole (ABILIFY) 10 MG tablet Take 1 tablet (10 mg total) by mouth daily. 90 tablet 0  . aspirin EC 81 MG tablet Take 1 tablet (81 mg total) by mouth daily. 90 tablet 3  . buPROPion (WELLBUTRIN XL) 300 MG 24 hr tablet Take 1 tablet (300 mg total) by mouth daily. 90 tablet 0  . glipiZIDE (GLUCOTROL XL) 10 MG 24 hr tablet Take 1 tablet (10 mg total) by mouth daily with breakfast. 90 tablet 1  . [START ON 09/11/2019] HYDROcodone-acetaminophen (NORCO) 5-325 MG tablet Take one tablet by mouth once daily for pain 30 tablet 0  . [START ON 10/11/2019] HYDROcodone-acetaminophen (NORCO) 5-325 MG tablet Take one tablet by mouth once daily for pain 30 tablet 0  . [START ON 08/12/2019] HYDROcodone-acetaminophen (NORCO/VICODIN) 5-325 MG tablet Take one tablet by mouth once daily for pain 30 tablet 0  . megestrol (MEGACE) 40 MG tablet Take 1 tablet (40 mg total) by mouth daily. 30 tablet 3  . metoprolol tartrate (LOPRESSOR) 25 MG tablet TAKE 1 TABLET (25 MG TOTAL) BY MOUTH DAILY. 90 tablet 1  . potassium chloride (KLOR-CON 10)  10 MEQ tablet Take 1 tablet (10 mEq total) by mouth 2 (two) times daily. 60 tablet 0  . rosuvastatin (CRESTOR) 5 MG tablet Take 1 tablet (5 mg total) by mouth daily. 90 tablet 3  . temazepam (RESTORIL) 30 MG capsule TAKE 1 CAPSULE BY MOUTH ONCE DAILY AT BEDTIME AS NEEDED FOR SLEEP (Patient taking differently: Take 30 mg by mouth at bedtime as needed for sleep. ) 90 capsule 1  . temazepam (RESTORIL) 30 MG capsule Take 1 capsule (30 mg total) by mouth at bedtime. [place]  capsule 5  . [START ON 08/09/2019] temazepam (RESTORIL) 30 MG capsule Take 1 capsule (30 mg total) by mouth at bedtime as needed for sleep. 30 capsule 5  . triamterene-hydrochlorothiazide (MAXZIDE-25) 37.5-25 MG tablet Take 1 tablet by mouth daily. 90 tablet 3  . venlafaxine XR (EFFEXOR-XR) 150 MG 24 hr capsule 225 mg daily (150 mg + 75 mg) 90 capsule 2  . venlafaxine XR (EFFEXOR-XR) 75 MG 24 hr capsule 225 mg daily (150 mg + 75 mg) 90 capsule 0   No current facility-administered medications for this visit.     Musculoskeletal: Strength & Muscle Tone: unable to assess due to telemed visit Gait & Station: unable to assess due to telemed visit Patient leans: unable to assess due to telemed visit  Psychiatric Specialty Exam: Review of Systems  There were no vitals taken for this visit.There is no height or weight on file to calculate BMI.  General Appearance: Fairly Groomed  Eye Contact:  Good  Speech:  Clear and Coherent and Normal Rate  Volume:  Normal  Mood:  Euthymic  Affect:  Congruent  Thought Process:  Goal Directed, Linear and Descriptions of Associations: Intact  Orientation:  Full (Time, Place, and Person)  Thought Content: Logical   Suicidal Thoughts:  No  Homicidal Thoughts:  No  Memory:  Recent;   Good Remote;   Good  Judgement:  Fair  Insight:  Fair  Psychomotor Activity:  Normal  Concentration:  Concentration: Good and Attention Span: Fair  Recall:  Good  Fund of Knowledge: Good  Language: Good   Akathisia:  Negative  Handed:  Right  AIMS (if indicated): not done  Assets:  Communication Skills Desire for Improvement Financial Resources/Insurance Housing Social Support  ADL's:  Intact  Cognition: WNL  Sleep:  Poor, difficulty in staying asleep   Screenings: GAD-7     Counselor from 05/15/2018 in [facility] Office Visit from 01/08/2017 in [facility]  Total GAD-7 Score  17  14    PHQ2-9     Office Visit from 08/04/2019 in [facility] Office Visit from 05/13/2019 in [facility] Office Visit from 02/03/2019 in [facility] Office Visit from 11/06/2018 in [facility] Office Visit from 08/04/2018 in [facility]  PHQ-2 Total Score  2  4  1  3  2   PHQ-9 Total Score  13  13  --  12  5       Assessment and Plan: Patient reported that her mood has been stable however she continues to have difficulty staying asleep.  She stated she is going to reschedule her appointment for sleep study.  She was encouraged to contact the office soon.  In the interim she will continue the same regimen.  1. MDD (major depressive disorder), recurrent, in full remission ([place])  - ARIPiprazole (ABILIFY) 10 MG tablet; Take 1 tablet (10 mg total) by mouth daily.  Dispense: 90 tablet; Refill: 0 - buPROPion (WELLBUTRIN XL) 300 MG 24 hr tablet; Take 1 tablet (300 mg total) by mouth daily.  Dispense: 90 tablet; Refill: 0 - venlafaxine XR (EFFEXOR-XR) 150 MG 24 hr capsule; 225 mg daily (150 mg + 75 mg)  Dispense: 90 capsule; Refill: 0 - venlafaxine XR (EFFEXOR-XR) 75 MG 24 hr capsule; 225 mg daily (150 mg + 75 mg)  Dispense: 90 capsule; Refill: 0  2. Insomnia, unspecified type - Continue temazepam 30 mg HS, prescription with refills was sent recently by her PCP  Dr. Moshe Cipro.   Continue same regimen. Pt is to follow up regarding the scheduling of her sleep study. F/up in 2 months.  [place] Crane,  MD 08/07/2019, 8:50 AM

## 2019-08-12 ENCOUNTER — Telehealth: Payer: Self-pay

## 2019-08-12 DIAGNOSIS — E1159 Type 2 diabetes mellitus with other circulatory complications: Secondary | ICD-10-CM

## 2019-08-12 NOTE — Telephone Encounter (Signed)
a1c ordered to have done tomorrow

## 2019-08-13 ENCOUNTER — Other Ambulatory Visit: Payer: Self-pay

## 2019-08-13 ENCOUNTER — Other Ambulatory Visit (HOSPITAL_COMMUNITY)
Admission: RE | Admit: 2019-08-13 | Discharge: 2019-08-13 | Disposition: A | Discharge status: Home / Self Care | Payer: No Typology Code available for payment source | Source: Ambulatory Visit | Attending: Family Medicine | Admitting: Family Medicine

## 2019-08-13 DIAGNOSIS — E1159 Type 2 diabetes mellitus with other circulatory complications: Secondary | ICD-10-CM | POA: Diagnosis present

## 2019-08-13 LAB — HEMOGLOBIN A1C
Hgb A1c MFr Bld: 9.9 % — ABNORMAL HIGH (ref 4.8–5.6)
Mean Plasma Glucose: 237.43 mg/dL

## 2019-08-14 ENCOUNTER — Encounter: Payer: Self-pay | Admitting: Family Medicine

## 2019-08-17 ENCOUNTER — Other Ambulatory Visit: Payer: Self-pay | Admitting: Obstetrics & Gynecology

## 2019-08-18 ENCOUNTER — Other Ambulatory Visit: Payer: Self-pay | Admitting: Family Medicine

## 2019-08-18 MED FILL — METOPROLOL TARTRATE 25 MG T: 25 | 90 days supply | Qty: 90 | Fill #0

## 2019-08-20 ENCOUNTER — Ambulatory Visit (INDEPENDENT_AMBULATORY_CARE_PROVIDER_SITE_OTHER): Payer: No Typology Code available for payment source | Admitting: Family Medicine

## 2019-08-20 ENCOUNTER — Other Ambulatory Visit: Payer: Self-pay

## 2019-08-20 VITALS — BP 126/90 | HR 75 | Temp 97.6°F | Resp 15 | Ht 65.0 in | Wt 217.0 lb

## 2019-08-20 DIAGNOSIS — E1159 Type 2 diabetes mellitus with other circulatory complications: Secondary | ICD-10-CM

## 2019-08-20 DIAGNOSIS — I10 Essential (primary) hypertension: Secondary | ICD-10-CM

## 2019-08-20 MED ORDER — GLIPIZIDE ER 10 MG PO TB24: 10.0000 mg | ORAL_TABLET | Freq: Every day | ORAL | 1 refills | Status: DC

## 2019-08-20 MED ORDER — GLIPIZIDE ER 5 MG PO TB24: 5.0000 mg | ORAL_TABLET | Freq: Every day | ORAL | 1 refills | Status: DC

## 2019-08-20 MED FILL — glipiZIDE ER 10 MG TB24: 10 | 90 days supply | Qty: 90 | Fill #0

## 2019-08-20 MED FILL — glipiZIDE XL 5 MG TB24: 5 | 90 days supply | Qty: 90 | Fill #0

## 2019-08-20 NOTE — Patient Instructions (Addendum)
Phone visit with MD re eval blood sugar in 4 weeks, please send fasting blood sugar every Thursday to me  Increase glipizide to 15 mg daily, (10 plus 5 mg tabs together)  Eat on regular schedule  Only water or NO cal liquid  Snacks, veges and or fruit unprocessed  It is important that you exercise regularly at least 30 minutes 5 times a week. If you develop chest pain, have severe difficulty breathing, or feel very tired, stop exercising immediately and seek medical attention  Aim for three 10 minute exercise sessions every day and at least 6 hrs sleep  Must also do diabetic ed

## 2019-08-23 ENCOUNTER — Encounter: Payer: Self-pay | Admitting: Family Medicine

## 2019-08-23 NOTE — Assessment & Plan Note (Signed)
Obesity associated with hTN and diabetes  Patient re-educated about  the importance of commitment to a  minimum of 150 minutes of exercise per week as able.  The importance of healthy food choices with portion control discussed, as well as eating regularly and within a 12 hour window most days. The need to choose "clean , green" food 50 to 75% of the time is discussed, as well as to make water the primary drink and set a goal of 64 ounces water daily.    Weight /BMI 08/20/2019 08/04/2019 05/29/2019  WEIGHT 217 lb 215 lb 215 lb  HEIGHT 5\' 5"  5\' 5"  5\' 5"   BMI 36.11 kg/m2 35.78 kg/m2 35.78 kg/m2  Some encounter information is confidential and restricted. Go to Review Flowsheets activity to see all data.

## 2019-08-23 NOTE — Assessment & Plan Note (Signed)
Controlled, no change in medication DASH diet and commitment to daily physical activity for a minimum of 30 minutes discussed and encouraged, as a part of hypertension management. The importance of attaining a healthy weight is also discussed.  BP/Weight 08/20/2019 08/04/2019 05/29/2019 05/13/2019 04/03/2019 03/25/2019 Q000111Q  Systolic BP 123XX123 A999333 A999333 123XX123 Q000111Q Q000111Q 123XX123  Diastolic BP 90 82 82 84 87 80 73  Wt. (Lbs) 217 215 215 212 221 - 215  BMI 36.11 35.78 35.78 35.28 36.78 - 35.78  Some encounter information is confidential and restricted. Go to Review Flowsheets activity to see all data.

## 2019-08-23 NOTE — Progress Notes (Signed)
Mary Roman     MRN: [id number]      DOB: 14-Jan-1971   HPI Mary Roman is here for follow up and re-evaluation of uncontrolled blood sugar. She has been non compliant as far as diet an, medications and testing, suffers from severe depression which contributes to all 3. Is aware of and committed to the need to change and has her sister as a co partner to help. Will go to diabetic ed also C/o fatigue and polyfipsia, denies hypoglycemic episodes  ROS Denies recent fever or chills. Denies sinus pressure, nasal congestion, ear pain or sore throat. Denies chest congestion, productive cough or wheezing. Denies chest pains, palpitations and leg swelling Denies abdominal pain, nausea, vomiting,diarrhea or constipation.   Denies dysuria, frequency, hesitancy or incontinence. Denies skin break down or rash.   PE  BP 126/90   Pulse 75   Temp 97.6 F (36.4 C) (Temporal)   Resp 15   Ht 5\' 5"  (1.651 m)   Wt 217 lb (98.4 kg)   SpO2 97%   BMI 36.11 kg/m   Patient alert and oriented and in no cardiopulmonary distress.  HEENT: No facial asymmetry, EOMI,     Neck supple .  Chest: Clear to auscultation bilaterally.  CVS: S1, S2 no murmurs, no S3.Regular rate.  ABD: Soft non tender.   Ext: No edema  MS: Adequate ROM spine, shoulders, hips and knees.  Skin: Intact, no ulcerations or rash noted.  Psych: Good eye contact, flat affect. Memory intact not anxious or depressed appearing.  CNS: CN 2-12 intact, power,  normal throughout.no focal deficits noted.   Assessment & Plan  Type 2 diabetes mellitus with vascular disease ([place]) uncontrolled and  deteirated Mary Roman is reminded of the importance of commitment to daily physical activity for 30 minutes or more, as able and the need to limit carbohydrate intake to 30 to 60 grams per meal to help with blood sugar control.   The need to take medication as prescribed, test blood sugar as directed, and to call between  visits if there is a concern that blood sugar is uncontrolled is also discussed.   Mary Roman is reminded of the importance of daily foot exam, annual eye examination, and good blood sugar, blood pressure and cholesterol control. Increase glipizide to 15mg   Daily diabetic in house both by myself and LPN, alsop referred to educator Weekly logs to be sent in, f/u in 4 weeks  Diabetic Labs Latest Ref Rng & Units 08/13/2019 08/06/2019 05/13/2019 03/20/2019 01/29/2019  HbA1c 4.8 - 5.6 % 9.9(H) - - 8.4(H) 7.7(H)  Microalbumin Not Estab. ug/mL - - 37.3(H) - -  Micro/Creat Ratio 0 - 29 mg/g creat - - 12 - -  Chol 0 - 200 mg/dL - - 150 - 159  HDL >40 mg/dL - - 29(L) - 29(L)  Calc LDL 0 - 99 mg/dL - - 106(H) - 119(H)  Triglycerides <150 mg/dL - - 75 - 56  Creatinine 0.44 - 1.00 mg/dL - 0.94 1.00 1.01(H) 0.99   BP/Weight 08/20/2019 08/04/2019 05/29/2019 05/13/2019 04/03/2019 03/25/2019 Q000111Q  Systolic BP 123XX123 A999333 A999333 123XX123 Q000111Q Q000111Q 123XX123  Diastolic BP 90 82 82 84 87 80 73  Wt. (Lbs) 217 215 215 212 221 - 215  BMI 36.11 35.78 35.78 35.28 36.78 - 35.78  Some encounter information is confidential and restricted. Go to Review Flowsheets activity to see all data.   Foot/eye exam completion dates Latest Ref Rng & Units 02/03/2019 01/23/2018  Eye Exam No Retinopathy - No Retinopathy  Foot Form Completion - Done -        Essential hypertension Controlled, no change in medication DASH diet and commitment to daily physical activity for a minimum of 30 minutes discussed and encouraged, as a part of hypertension management. The importance of attaining a healthy weight is also discussed.  BP/Weight 08/20/2019 08/04/2019 05/29/2019 05/13/2019 04/03/2019 03/25/2019 Q000111Q  Systolic BP 123XX123 A999333 A999333 123XX123 Q000111Q Q000111Q 123XX123  Diastolic BP 90 82 82 84 87 80 73  Wt. (Lbs) 217 215 215 212 221 - 215  BMI 36.11 35.78 35.78 35.28 36.78 - 35.78  Some encounter information is confidential and restricted. Go to Review Flowsheets  activity to see all data.       Morbid obesity ([place]) Obesity associated with hTN and diabetes  Patient re-educated about  the importance of commitment to a  minimum of 150 minutes of exercise per week as able.  The importance of healthy food choices with portion control discussed, as well as eating regularly and within a 12 hour window most days. The need to choose "clean , green" food 50 to 75% of the time is discussed, as well as to make water the primary drink and set a goal of 64 ounces water daily.    Weight /BMI 08/20/2019 08/04/2019 05/29/2019  WEIGHT 217 lb 215 lb 215 lb  HEIGHT 5\' 5"  5\' 5"  5\' 5"   BMI 36.11 kg/m2 35.78 kg/m2 35.78 kg/m2  Some encounter information is confidential and restricted. Go to Review Flowsheets activity to see all data.

## 2019-08-23 NOTE — Assessment & Plan Note (Addendum)
uncontrolled and  deteirated Mary Roman is reminded of the importance of commitment to daily physical activity for 30 minutes or more, as able and the need to limit carbohydrate intake to 30 to 60 grams per meal to help with blood sugar control.   The need to take medication as prescribed, test blood sugar as directed, and to call between visits if there is a concern that blood sugar is uncontrolled is also discussed.   Mary Roman is reminded of the importance of daily foot exam, annual eye examination, and good blood sugar, blood pressure and cholesterol control. Increase glipizide to 15mg   Daily diabetic in house both by myself and LPN, alsop referred to educator Weekly logs to be sent in, f/u in 4 weeks  Diabetic Labs Latest Ref Rng & Units 08/13/2019 08/06/2019 05/13/2019 03/20/2019 01/29/2019  HbA1c 4.8 - 5.6 % 9.9(H) - - 8.4(H) 7.7(H)  Microalbumin Not Estab. ug/mL - - 37.3(H) - -  Micro/Creat Ratio 0 - 29 mg/g creat - - 12 - -  Chol 0 - 200 mg/dL - - 150 - 159  HDL >40 mg/dL - - 29(L) - 29(L)  Calc LDL 0 - 99 mg/dL - - 106(H) - 119(H)  Triglycerides <150 mg/dL - - 75 - 56  Creatinine 0.44 - 1.00 mg/dL - 0.94 1.00 1.01(H) 0.99   BP/Weight 08/20/2019 08/04/2019 05/29/2019 05/13/2019 04/03/2019 03/25/2019 Q000111Q  Systolic BP 123XX123 A999333 A999333 123XX123 Q000111Q Q000111Q 123XX123  Diastolic BP 90 82 82 84 87 80 73  Wt. (Lbs) 217 215 215 212 221 - 215  BMI 36.11 35.78 35.78 35.28 36.78 - 35.78  Some encounter information is confidential and restricted. Go to Review Flowsheets activity to see all data.   Foot/eye exam completion dates Latest Ref Rng & Units 02/03/2019 01/23/2018  Eye Exam No Retinopathy - No Retinopathy  Foot Form Completion - Done -

## 2019-09-02 ENCOUNTER — Other Ambulatory Visit: Payer: Self-pay

## 2019-09-02 MED ORDER — GLIPIZIDE ER 5 MG PO TB24: 5.0000 mg | ORAL_TABLET | Freq: Every day | ORAL | 1 refills | Status: DC

## 2019-09-02 MED ORDER — GLIPIZIDE ER 10 MG PO TB24: 10.0000 mg | ORAL_TABLET | Freq: Every day | ORAL | 1 refills | Status: DC

## 2019-09-17 ENCOUNTER — Ambulatory Visit: Payer: No Typology Code available for payment source | Admitting: Family Medicine

## 2019-09-24 ENCOUNTER — Ambulatory Visit: Payer: No Typology Code available for payment source | Admitting: Family Medicine

## 2019-09-24 MED FILL — TRIAMTERENE/HCTZ 37.5/25 TB: 37.5-25 | 90 days supply | Qty: 90 | Fill #1

## 2019-09-25 ENCOUNTER — Encounter: Payer: Self-pay | Admitting: Family Medicine

## 2019-09-25 ENCOUNTER — Telehealth (INDEPENDENT_AMBULATORY_CARE_PROVIDER_SITE_OTHER): Payer: No Typology Code available for payment source | Admitting: Family Medicine

## 2019-09-25 ENCOUNTER — Other Ambulatory Visit: Payer: Self-pay

## 2019-09-25 VITALS — BP 126/90 | Ht 65.0 in | Wt 217.0 lb

## 2019-09-25 DIAGNOSIS — E1159 Type 2 diabetes mellitus with other circulatory complications: Secondary | ICD-10-CM | POA: Diagnosis not present

## 2019-09-25 DIAGNOSIS — Z91199 Patient's noncompliance with other medical treatment and regimen due to unspecified reason: Secondary | ICD-10-CM

## 2019-09-25 DIAGNOSIS — Z9119 Patient's noncompliance with other medical treatment and regimen: Secondary | ICD-10-CM

## 2019-09-25 MED ORDER — GLIPIZIDE ER 5 MG PO TB24: 5.0000 mg | ORAL_TABLET | Freq: Every day | ORAL | 1 refills | Status: DC

## 2019-09-25 NOTE — Assessment & Plan Note (Signed)
She reports that she is taking her medicine as directed.  But her blood sugars have not improved.  Possibly related to the fact that she also has not changed her diet.  Is not currently exercising.  Is just now recently started eating breakfast where she was skipping breakfast.  Extensive education and support provided today on the need for adjustment change.  Referral to nutritionist to help with diet planning.

## 2019-09-25 NOTE — Progress Notes (Signed)
Virtual Visit via Telephone Note   This visit type was conducted due to national recommendations for restrictions regarding the COVID-19 Pandemic (e.g. social distancing) in an effort to limit this patient's exposure and mitigate transmission in our community.  Due to her co-morbid illnesses, this patient is at least at moderate risk for complications without adequate follow up.  This format is felt to be most appropriate for this patient at this time.  The patient did not have access to video technology/had technical difficulties with video requiring transitioning to audio format only (telephone).  All issues noted in this document were discussed and addressed.  No physical exam could be performed with this format.    Evaluation Performed:  Follow-up visit  Date:  09/25/2019   ID:  Mary Roman, Mary Roman 10/08/1970, MRN [id number]  Patient Location: Home Provider Location: Office  Location of Patient: Home Location of Provider: Telehealth Consent was obtain for visit to be over via telehealth. I verified that I am speaking with the correct person using two identifiers.  PCP:  Fayrene Helper, MD   Chief Complaint:  diabetes  History of Present Illness:    Mary Roman is a 49 y.o. female with history of type 2 diabetes not well controlled, headache, hypertension, obesity among others.  Presents today for 4-week follow-up regarding diabetes after having medication adjustment back in January when she saw Dr. Moshe Cipro.  Reports that she was doing really well checking her blood sugars they were ranging in the 200 range.  But she is not checked in the last week and a half.  She reports that she just does not have time to do that in the morning.  She is running around trying to get to work.  She additionally reports that she has not changed her diet.  Not currently exercising.  And eating a lot of sugar and sweets.  She denies having any signs or symptoms of hypoglycemia.  She  denies having any headaches or vision changes at this time.  Denies having any chest pain or leg swelling.  The patient does not have symptoms concerning for COVID-19 infection (fever, chills, cough, or new shortness of breath).   Past Medical, Surgical, Social History, Allergies, and Medications have been Reviewed.  Past Medical History:  Diagnosis Date  . Anemia   . Anxiety   . Chronic back pain   . Diabetes mellitus, type II ([place])   . Headache(784.0)   . Hypertension   . Obesity   . Palpitation   . Sickle cell trait ([place])   . Thyromegaly    Past Surgical History:  Procedure Laterality Date  . DILATATION AND CURETTAGE/HYSTEROSCOPY WITH MINERVA N/A 03/25/2019   Procedure: DILATATION AND CURETTAGE/HYSTEROSCOPY WITH  ATTEMPTED MINERVA ENDOMETRIAL ABLATION;  Surgeon: Florian Buff, MD;  Location: [facility];  Service: Gynecology;  Laterality: N/A;  . None       Current Meds  Medication Sig  . ARIPiprazole (ABILIFY) 10 MG tablet Take 1 tablet (10 mg total) by mouth daily.  Marland Kitchen aspirin EC 81 MG tablet Take 1 tablet (81 mg total) by mouth daily.  Marland Kitchen buPROPion (WELLBUTRIN XL) 300 MG 24 hr tablet Take 1 tablet (300 mg total) by mouth daily.  Marland Kitchen glipiZIDE (GLUCOTROL XL) 10 MG 24 hr tablet Take 1 tablet (10 mg total) by mouth daily with breakfast.  . glipiZIDE (GLUCOTROL XL) 5 MG 24 hr tablet Take 1 tablet (5 mg total) by mouth daily with breakfast.  .  HYDROcodone-acetaminophen (NORCO) 5-325 MG tablet Take one tablet by mouth once daily for pain  . [START ON 10/11/2019] HYDROcodone-acetaminophen (NORCO) 5-325 MG tablet Take one tablet by mouth once daily for pain  . megestrol (MEGACE) 40 MG tablet TAKE 3 TABLETS BY MOUTH ONCE DAILY FOR 5 DAYS, THEN TAKE 2 TABLETS ONCE DAILY FOR 5 DAYS, THEN TAKE 1 TABLET ONCE DAILY.  . metoprolol tartrate (LOPRESSOR) 25 MG tablet TAKE 1 TABLET BY MOUTH DAILY.  Marland Kitchen potassium chloride (KLOR-CON 10) 10 MEQ tablet Take 1 tablet (10 mEq total) by mouth 2 (two) times  daily.  . rosuvastatin (CRESTOR) 5 MG tablet Take 1 tablet (5 mg total) by mouth daily.  . temazepam (RESTORIL) 30 MG capsule TAKE 1 CAPSULE BY MOUTH ONCE DAILY AT BEDTIME AS NEEDED FOR SLEEP (Patient taking differently: Take 30 mg by mouth at bedtime as needed for sleep. )  . triamterene-hydrochlorothiazide (MAXZIDE-25) 37.5-25 MG tablet Take 1 tablet by mouth daily.  Marland Kitchen venlafaxine XR (EFFEXOR-XR) 150 MG 24 hr capsule 225 mg daily (150 mg + 75 mg)  . venlafaxine XR (EFFEXOR-XR) 75 MG 24 hr capsule 225 mg daily (150 mg + 75 mg)  . [DISCONTINUED] glipiZIDE (GLUCOTROL XL) 10 MG 24 hr tablet Take 1 tablet (10 mg total) by mouth daily with breakfast.  . [DISCONTINUED] glipiZIDE (GLUCOTROL XL) 5 MG 24 hr tablet Take 1 tablet (5 mg total) by mouth daily with breakfast.     Allergies:   Patient has no known allergies.   ROS:   Please see the history of present illness.    All other systems reviewed and are negative.   Labs/Other Tests and Data Reviewed:    Recent Labs: 05/13/2019: ALT 20; Hemoglobin 12.3; Platelets 466; TSH 1.916 08/06/2019: BUN 9; Creatinine, Ser 0.94; Potassium 3.5; Sodium 138   Recent Lipid Panel Lab Results  Component Value Date/Time   CHOL 150 05/13/2019 09:37 AM   TRIG 75 05/13/2019 09:37 AM   HDL 29 (L) 05/13/2019 09:37 AM   CHOLHDL 5.2 05/13/2019 09:37 AM   LDLCALC 106 (H) 05/13/2019 09:37 AM   LDLCALC 96 03/06/2018 09:28 AM    Wt Readings from Last 3 Encounters:  09/25/19 217 lb (98.4 kg)  08/20/19 217 lb (98.4 kg)  08/04/19 215 lb (97.5 kg)     Objective:    Vital Signs:  BP 126/90   Ht 5\' 5"  (1.651 m)   Wt 217 lb (98.4 kg)   BMI 36.11 kg/m    VITAL SIGNS:  reviewed GEN:  alert and oriented RESPIRATORY:  no shortness of breath in conversation PSYCH:  normal affect and mood   ASSESSMENT & PLAN:    1. Type 2 diabetes mellitus with vascular disease ([place])  - Amb Referral to Nutrition and Diabetic E - glipiZIDE (GLUCOTROL XL) 5 MG 24 hr tablet;  Take 1 tablet (5 mg total) by mouth daily with breakfast.  Dispense: 90 tablet; Refill: 1  2. Does not comply with treatment   Time:   Today, I have spent 20 minutes with the patient with telehealth technology discussing the above problems.     Medication Adjustments/Labs and Tests Ordered: Current medicines are reviewed at length with the patient today.  Concerns regarding medicines are outlined above.   Tests Ordered: No orders of the defined types were placed in this encounter.   Medication Changes: Meds ordered this encounter  Medications  . glipiZIDE (GLUCOTROL XL) 5 MG 24 hr tablet    Sig: Take 1 tablet (5  mg total) by mouth daily with breakfast.    Dispense:  90 tablet    Refill:  1    Disposition:  Follow up 4 weeks  Signed, Perlie Mayo, NP  09/25/2019 9:07 AM     [facility]

## 2019-09-25 NOTE — Patient Instructions (Addendum)
I appreciate the opportunity to provide you with care for your health and wellness. Today we discussed: diabetes   Follow up: 4 weeks for DM   No labs   Referral: Nutritionist for Diabetes Care  GOALS:  Give yourself grace and love to take care of you. Only you can make the changes needed, Dr Moshe Cipro and I are backing you and want you to succeed. Please message or call if you are having trouble.  Please take the time to check your blood sugars daily. Continue medications as Dr Moshe Cipro as ordered.  Increase water intake Increase protein intake in the mornings Increase fiber intake as well  Starting walking daily.  Please continue to practice social distancing to keep you, your family, and our community safe.  If you must go out, please wear a mask and practice good handwashing.  It was a pleasure to see you and I look forward to continuing to work together on your health and well-being. Please do not hesitate to call the office if you need care or have questions about your care.  Have a wonderful day and week. With Gratitude, Cherly Beach, DNP, AGNP-BC    Diabetes Mellitus and Nutrition, Adult When you have diabetes (diabetes mellitus), it is very important to have healthy eating habits because your blood sugar (glucose) levels are greatly affected by what you eat and drink. Eating healthy foods in the appropriate amounts, at about the same times every day, can help you:  Control your blood glucose.  Lower your risk of heart disease.  Improve your blood pressure.  Reach or maintain a healthy weight. Every person with diabetes is different, and each person has different needs for a meal plan. Your health care provider may recommend that you work with a diet and nutrition specialist (dietitian) to make a meal plan that is best for you. Your meal plan may vary depending on factors such as:  The calories you need.  The medicines you take.  Your weight.  Your blood  glucose, blood pressure, and cholesterol levels.  Your activity level.  Other health conditions you have, such as heart or kidney disease. How do carbohydrates affect me? Carbohydrates, also called carbs, affect your blood glucose level more than any other type of food. Eating carbs naturally raises the amount of glucose in your blood. Carb counting is a method for keeping track of how many carbs you eat. Counting carbs is important to keep your blood glucose at a healthy level, especially if you use insulin or take certain oral diabetes medicines. It is important to know how many carbs you can safely have in each meal. This is different for every person. Your dietitian can help you calculate how many carbs you should have at each meal and for each snack. Foods that contain carbs include:  Bread, cereal, rice, pasta, and crackers.  Potatoes and corn.  Peas, beans, and lentils.  Milk and yogurt.  Fruit and juice.  Desserts, such as cakes, cookies, ice cream, and candy. How does alcohol affect me? Alcohol can cause a sudden decrease in blood glucose (hypoglycemia), especially if you use insulin or take certain oral diabetes medicines. Hypoglycemia can be a life-threatening condition. Symptoms of hypoglycemia (sleepiness, dizziness, and confusion) are similar to symptoms of having too much alcohol. If your health care provider says that alcohol is safe for you, follow these guidelines:  Limit alcohol intake to no more than 1 drink per day for nonpregnant women and 2 drinks per day  for men. One drink equals 12 oz of beer, 5 oz of wine, or 1 oz of hard liquor.  Do not drink on an empty stomach.  Keep yourself hydrated with water, diet soda, or unsweetened iced tea.  Keep in mind that regular soda, juice, and other mixers may contain a lot of sugar and must be counted as carbs. What are tips for following this plan?  Reading food labels  Start by checking the serving size on the  "Nutrition Facts" label of packaged foods and drinks. The amount of calories, carbs, fats, and other nutrients listed on the label is based on one serving of the item. Many items contain more than one serving per package.  Check the total grams (g) of carbs in one serving. You can calculate the number of servings of carbs in one serving by dividing the total carbs by 15. For example, if a food has 30 g of total carbs, it would be equal to 2 servings of carbs.  Check the number of grams (g) of saturated and trans fats in one serving. Choose foods that have low or no amount of these fats.  Check the number of milligrams (mg) of salt (sodium) in one serving. Most people should limit total sodium intake to less than 2,300 mg per day.  Always check the nutrition information of foods labeled as "low-fat" or "nonfat". These foods may be higher in added sugar or refined carbs and should be avoided.  Talk to your dietitian to identify your daily goals for nutrients listed on the label. Shopping  Avoid buying canned, premade, or processed foods. These foods tend to be high in fat, sodium, and added sugar.  Shop around the outside edge of the grocery store. This includes fresh fruits and vegetables, bulk grains, fresh meats, and fresh dairy. Cooking  Use low-heat cooking methods, such as baking, instead of high-heat cooking methods like deep frying.  Cook using healthy oils, such as olive, canola, or sunflower oil.  Avoid cooking with butter, cream, or high-fat meats. Meal planning  Eat meals and snacks regularly, preferably at the same times every day. Avoid going long periods of time without eating.  Eat foods high in fiber, such as fresh fruits, vegetables, beans, and whole grains. Talk to your dietitian about how many servings of carbs you can eat at each meal.  Eat 4-6 ounces (oz) of lean protein each day, such as lean meat, chicken, fish, eggs, or tofu. One oz of lean protein is equal  to: ? 1 oz of meat, chicken, or fish. ? 1 egg. ?  cup of tofu.  Eat some foods each day that contain healthy fats, such as avocado, nuts, seeds, and fish. Lifestyle  Check your blood glucose regularly.  Exercise regularly as told by your health care provider. This may include: ? 150 minutes of moderate-intensity or vigorous-intensity exercise each week. This could be brisk walking, biking, or water aerobics. ? Stretching and doing strength exercises, such as yoga or weightlifting, at least 2 times a week.  Take medicines as told by your health care provider.  Do not use any products that contain nicotine or tobacco, such as cigarettes and e-cigarettes. If you need help quitting, ask your health care provider.  Work with a Social worker or diabetes educator to identify strategies to manage stress and any emotional and social challenges. Questions to ask a health care provider  Do I need to meet with a diabetes educator?  Do I need to meet  with a dietitian?  What number can I call if I have questions?  When are the best times to check my blood glucose? Where to find more information:  American Diabetes Association: diabetes.org  Academy of Nutrition and Dietetics: www.eatright.[employer] of Diabetes and Digestive and Kidney Diseases (NIH): [employer] Summary  A healthy meal plan will help you control your blood glucose and maintain a healthy lifestyle.  Working with a diet and nutrition specialist (dietitian) can help you make a meal plan that is best for you.  Keep in mind that carbohydrates (carbs) and alcohol have immediate effects on your blood glucose levels. It is important to count carbs and to use alcohol carefully. This information is not intended to replace advice given to you by your health care provider. Make sure you discuss any questions you have with your health care provider. Document Revised: 06/28/2017 Document Reviewed: 08/20/2016 Elsevier  Patient Education  2020 [employer].

## 2019-09-25 NOTE — Assessment & Plan Note (Signed)
Is not currently compliant with treatment of checking blood sugars, adjustment of diet taking care of her self.  She does not report that she takes her medicine. Currently medications are not improving blood sugars she reports that she eats a lot of sweets.  Does not take the time to check her blood sugars or take his best care of herself as she is working.  Extensive conversation in the need for adjustment to have self-care and self love in order to take care of herself so that she can have a healthy lifestyle and not have diabetic complications.  She is open to having a nutritionist appointment I have ordered this today.  Follow-up closely in 4 weeks to see what adjustments might be needed.  I have advised that she might need to go on insulin if we cannot get this under control.  Reviewed side effects, risks and benefits of medication.   Patient acknowledged agreement and understanding of the plan.

## 2019-09-30 MED FILL — ROSUVASTATIN CALCIUM 5 MG T: 5 | 90 days supply | Qty: 90 | Fill #1

## 2019-10-02 ENCOUNTER — Telehealth: Payer: Self-pay

## 2019-10-02 NOTE — Telephone Encounter (Signed)

## 2019-10-08 ENCOUNTER — Telehealth: Payer: No Typology Code available for payment source | Admitting: Cardiovascular Disease

## 2019-10-19 ENCOUNTER — Telehealth: Payer: No Typology Code available for payment source | Admitting: Cardiovascular Disease

## 2019-10-20 ENCOUNTER — Encounter: Payer: Self-pay | Admitting: Cardiovascular Disease

## 2019-10-23 ENCOUNTER — Ambulatory Visit (INDEPENDENT_AMBULATORY_CARE_PROVIDER_SITE_OTHER): Payer: No Typology Code available for payment source | Admitting: Family Medicine

## 2019-10-23 ENCOUNTER — Encounter: Payer: Self-pay | Admitting: Family Medicine

## 2019-10-23 ENCOUNTER — Other Ambulatory Visit: Payer: Self-pay

## 2019-10-23 VITALS — BP 126/90 | Ht 65.0 in | Wt 218.0 lb

## 2019-10-23 DIAGNOSIS — E1159 Type 2 diabetes mellitus with other circulatory complications: Secondary | ICD-10-CM | POA: Diagnosis not present

## 2019-10-23 DIAGNOSIS — I10 Essential (primary) hypertension: Secondary | ICD-10-CM | POA: Diagnosis not present

## 2019-10-23 DIAGNOSIS — E785 Hyperlipidemia, unspecified: Secondary | ICD-10-CM

## 2019-10-23 NOTE — Patient Instructions (Signed)
I appreciate the opportunity to provide you with care for your health and wellness. Today we discussed: Diabetes and eating habits   Follow up: Mid May   Labs fasting 1 week before appt in May.   No referrals today  Great job on getting the blood sugar levels down to 170's continue working on your eating habits. Be patient with yourself. Slow and steady with changes so you do not get frustrated.  Please continue to practice social distancing to keep you, your family, and our community safe.  If you must go out, please wear a mask and practice good handwashing.  It was a pleasure to see you and I look forward to continuing to work together on your health and well-being. Please do not hesitate to call the office if you need care or have questions about your care.  Have a wonderful day and week. With Gratitude, Cherly Beach, DNP, AGNP-BC

## 2019-10-23 NOTE — Progress Notes (Signed)
Virtual Visit via Telephone Note   This visit type was conducted due to national recommendations for restrictions regarding the COVID-19 Pandemic (e.g. social distancing) in an effort to limit this patient's exposure and mitigate transmission in our community.  Due to her co-morbid illnesses, this patient is at least at moderate risk for complications without adequate follow up.  This format is felt to be most appropriate for this patient at this time.  The patient did not have access to video technology/had technical difficulties with video requiring transitioning to audio format only (telephone).  All issues noted in this document were discussed and addressed.  No physical exam could be performed with this format.    Evaluation Performed:  Follow-up visit  Date:  10/23/2019   ID:  Mary Roman, Mary Roman  Patient Location: Home Provider Location: Office  Location of Patient: Home Location of Provider: Telehealth Consent was obtain for visit to be over via telehealth. I verified that I am speaking with the correct person using two identifiers.  PCP:  Fayrene Helper, MD   Chief Complaint:  Diabetes   History of Present Illness:    Mary Roman is a 49 y.o. female with history of type 2 diabetes that is not well controlled.  Last A1c was 9.9 in January.  She presented back in February for 4-week follow-up after having medication adjustment in January when she saw Dr. Moshe Cipro.  At that time she says that her blood sugars were ranging in the 200s predominantly fasting.  She is not checking her blood sugars as she was supposed to.  She reported that she just has a hard time she runs around at work her hours put her off getting off leg and therefore she eats late as well.  She had not been changing her diet or working to change her diet.  Reported that she is still eating a lot of sugar and sweets.  This visit she reports that she is done very well she  has been trying to eat better.  Not exercising as much as she could but her hours at work about the change will begin off at 5:30 in the evening which will improve her eating times and hopefully she can get some workouts in.  She reports that her blood sugars have been ranging in the 170s which are about 30 points lower than they were ranging.  She can tell that she feels a little bit better.  She cannot get into the nutritionist until next month.  But she knows now kind of what is going on as she has been checking her blood sugars more consistently and following her eating habits. She denies having any signs or symptoms of hypoglycemia, headaches, vision changes, chest pain, leg swelling, polydipsia, polyuria, polyphagia.  Denies having any UTIs wounds or rashes.  The patient does not have symptoms concerning for COVID-19 infection (fever, chills, cough, or new shortness of breath).   Past Medical, Surgical, Social History, Allergies, and Medications have been Reviewed.  Past Medical History:  Diagnosis Date  . Anemia   . Anxiety   . Chronic back pain   . Diabetes mellitus, type II ([place])   . Headache(784.0)   . Hypertension   . Obesity   . Palpitation   . Sickle cell trait ([place])   . Thyromegaly    Past Surgical History:  Procedure Laterality Date  . DILATATION AND CURETTAGE/HYSTEROSCOPY WITH MINERVA N/A 03/25/2019   Procedure: DILATATION  AND CURETTAGE/HYSTEROSCOPY WITH  ATTEMPTED MINERVA ENDOMETRIAL ABLATION;  Surgeon: Florian Buff, MD;  Location: [facility];  Service: Gynecology;  Laterality: N/A;  . None       Current Meds  Medication Sig  . ARIPiprazole (ABILIFY) 10 MG tablet Take 1 tablet (10 mg total) by mouth daily.  Marland Kitchen aspirin EC 81 MG tablet Take 1 tablet (81 mg total) by mouth daily.  Marland Kitchen buPROPion (WELLBUTRIN XL) 300 MG 24 hr tablet Take 1 tablet (300 mg total) by mouth daily.  Marland Kitchen glipiZIDE (GLUCOTROL XL) 10 MG 24 hr tablet Take 1 tablet (10 mg total) by mouth daily with breakfast.   . glipiZIDE (GLUCOTROL XL) 5 MG 24 hr tablet Take 1 tablet (5 mg total) by mouth daily with breakfast.  . HYDROcodone-acetaminophen (NORCO) 5-325 MG tablet Take one tablet by mouth once daily for pain  . megestrol (MEGACE) 40 MG tablet TAKE 3 TABLETS BY MOUTH ONCE DAILY FOR 5 DAYS, THEN TAKE 2 TABLETS ONCE DAILY FOR 5 DAYS, THEN TAKE 1 TABLET ONCE DAILY.  . metoprolol tartrate (LOPRESSOR) 25 MG tablet TAKE 1 TABLET BY MOUTH DAILY.  Marland Kitchen potassium chloride (KLOR-CON 10) 10 MEQ tablet Take 1 tablet (10 mEq total) by mouth 2 (two) times daily.  . rosuvastatin (CRESTOR) 5 MG tablet Take 1 tablet (5 mg total) by mouth daily.  . temazepam (RESTORIL) 30 MG capsule TAKE 1 CAPSULE BY MOUTH ONCE DAILY AT BEDTIME AS NEEDED FOR SLEEP (Patient taking differently: Take 30 mg by mouth at bedtime as needed for sleep. )  . triamterene-hydrochlorothiazide (MAXZIDE-25) 37.5-25 MG tablet Take 1 tablet by mouth daily.  Marland Kitchen venlafaxine XR (EFFEXOR-XR) 150 MG 24 hr capsule 225 mg daily (150 mg + 75 mg)  . venlafaxine XR (EFFEXOR-XR) 75 MG 24 hr capsule 225 mg daily (150 mg + 75 mg)     Allergies:   Patient has no known allergies.   ROS:   Please see the history of present illness.    All other systems reviewed and are negative.   Labs/Other Tests and Data Reviewed:    Recent Labs: 05/13/2019: ALT 20; Hemoglobin 12.3; Platelets 466; TSH 1.916 08/06/2019: BUN 9; Creatinine, Ser 0.94; Potassium 3.5; Sodium 138   Recent Lipid Panel Lab Results  Component Value Date/Time   CHOL 150 05/13/2019 09:37 AM   TRIG 75 05/13/2019 09:37 AM   HDL 29 (L) 05/13/2019 09:37 AM   CHOLHDL 5.2 05/13/2019 09:37 AM   LDLCALC 106 (H) 05/13/2019 09:37 AM   LDLCALC 96 03/06/2018 09:28 AM    Wt Readings from Last 3 Encounters:  10/23/19 218 lb (98.9 kg)  09/25/19 217 lb (98.4 kg)  08/20/19 217 lb (98.4 kg)     Objective:    Vital Signs:  BP 126/90   Ht 5\' 5"  (1.651 m)   Wt 218 lb (98.9 kg)   BMI 36.28 kg/m    VITAL SIGNS:   reviewed GEN:  alert and oriented RESPIRATORY:  no shortness of breath in conversation  PSYCH:  normal affect and mood  ASSESSMENT & PLAN:     1. Type 2 diabetes mellitus with vascular disease ([facility])  - CBC - COMPLETE METABOLIC PANEL WITH GFR - Hemoglobin A1c - Lipid panel  2. Essential hypertension  - CBC - COMPLETE METABOLIC PANEL WITH GFR  3. Dyslipidemia (high LDL; low HDL)  - Lipid panel   Time:   Today, I have spent 20 minutes with the patient with telehealth technology discussing the above problems.  Medication Adjustments/Labs and Tests Ordered: Current medicines are reviewed at length with the patient today.  Concerns regarding medicines are outlined above.   Tests Ordered: No orders of the defined types were placed in this encounter.   Medication Changes: No orders of the defined types were placed in this encounter.   Disposition:  Follow up mid-May Signed, Perlie Mayo, NP  10/23/2019 9:31 AM     [facility]

## 2019-10-23 NOTE — Assessment & Plan Note (Signed)
A1c in January was 9.9%.  She is really taking to try to be more compliant with checking her blood sugars and adjustment of her diet.  She reports that her blood sugars were running in the 200 range in February when I talked with her about this.  Now they are down into the 170s.  She reports taking better care of her food choices but she knows that she is about to start moving and that has become very stressful for her so she has made some slips.  And is having a hard time managing this she reports that she will try to stay the course and she has an appointment with a nutritionist in April but is trying to do her best right now to monitor and do things that she needs to help her self.  We will have her follow-up in May and she will get her recheck of A1c to see how things are going.  Advised her to continue all medications as directed and continue checking her blood sugars as directed.  Reviewed side effects, risks and benefits of medication.   Patient acknowledged agreement and understanding of the plan.

## 2019-10-23 NOTE — Assessment & Plan Note (Signed)
Controlled continue all medications as directed.  DASH diet and physical activity 30 minutes release 5 days a week or more recommended.

## 2019-10-23 NOTE — Assessment & Plan Note (Signed)
We will get updated labs in May.  Encouraged to continue all medications and to maintain a heart healthy low-fat diet.

## 2019-11-05 ENCOUNTER — Ambulatory Visit: Payer: No Typology Code available for payment source | Admitting: Family Medicine

## 2019-11-11 ENCOUNTER — Other Ambulatory Visit: Payer: Self-pay | Admitting: Family Medicine

## 2019-11-11 ENCOUNTER — Telehealth: Payer: Self-pay

## 2019-11-11 MED ORDER — HYDROCODONE-ACETAMINOPHEN 5-325 MG PO TABS: ORAL_TABLET | ORAL | 0 refills | Status: DC

## 2019-11-11 MED ORDER — TEMAZEPAM 30 MG PO CAPS: 30.0000 mg | ORAL_CAPSULE | Freq: Every evening | ORAL | 3 refills | Status: DC | PRN

## 2019-11-11 MED ORDER — TEMAZEPAM 30 MG PO CAPS: 30.0000 mg | ORAL_CAPSULE | Freq: Every day | ORAL | 3 refills | Status: DC

## 2019-11-11 NOTE — Telephone Encounter (Signed)
Patient called back and had wanted it sent to [facility]. I called [facility] and cancelled the rx. Can it be resent to [facility]?

## 2019-11-11 NOTE — Telephone Encounter (Signed)
Done

## 2019-11-11 NOTE — Telephone Encounter (Signed)
States she is out of hydrocodone and will come leave specimen tomorrow but earliest appt was next week. Needs enough to last until her appt

## 2019-11-11 NOTE — Telephone Encounter (Signed)
[facility] to [facility], please cancel order to [facility], sorry!lumbosacral also let pt know, thanks

## 2019-11-11 NOTE — Telephone Encounter (Signed)
Pls let pt know NEEDS to have the blood work tomorrow as well, her blood sugar was uncontrolled and this needs to be closely followed. 10 days of med is prescribed

## 2019-11-12 ENCOUNTER — Other Ambulatory Visit (HOSPITAL_COMMUNITY)
Admission: RE | Admit: 2019-11-12 | Discharge: 2019-11-12 | Disposition: A | Discharge status: Home / Self Care | Payer: No Typology Code available for payment source | Source: Other Acute Inpatient Hospital | Attending: Family Medicine | Admitting: Family Medicine

## 2019-11-12 ENCOUNTER — Other Ambulatory Visit: Payer: Self-pay | Admitting: *Deleted

## 2019-11-12 DIAGNOSIS — R52 Pain, unspecified: Secondary | ICD-10-CM

## 2019-11-16 LAB — MISC LABCORP TEST (SEND OUT): Labcorp test code: 738526

## 2019-11-18 ENCOUNTER — Ambulatory Visit: Payer: No Typology Code available for payment source | Admitting: Nutrition

## 2019-11-20 ENCOUNTER — Ambulatory Visit: Payer: No Typology Code available for payment source | Admitting: Family Medicine

## 2019-12-03 ENCOUNTER — Encounter: Payer: Self-pay | Admitting: Family Medicine

## 2019-12-03 ENCOUNTER — Other Ambulatory Visit: Payer: Self-pay

## 2019-12-03 ENCOUNTER — Ambulatory Visit (INDEPENDENT_AMBULATORY_CARE_PROVIDER_SITE_OTHER): Payer: No Typology Code available for payment source | Admitting: Family Medicine

## 2019-12-03 VITALS — BP 124/84 | Temp 97.3°F | Resp 15 | Ht 65.0 in | Wt 211.0 lb

## 2019-12-03 DIAGNOSIS — G8929 Other chronic pain: Secondary | ICD-10-CM | POA: Diagnosis not present

## 2019-12-03 DIAGNOSIS — M5441 Lumbago with sciatica, right side: Secondary | ICD-10-CM

## 2019-12-03 NOTE — Assessment & Plan Note (Addendum)
Reviewed PDMP aware collected urine drug screen for compliance.  Medication use reviewed. Medication dose is effective.  No changes in symptoms at this time. Pain management agreement updated for new provider.  Reviewed in detail at office visit. No questions or concerns by Ms. Stthomas provided today. New prescription not due until May 15.  At that time renewal will be provided.

## 2019-12-03 NOTE — Assessment & Plan Note (Signed)
Chronic back pain secondary to having herniated disc.  Will be getting records from orthopedics.  Reports has been dealing with this for about 12 years.  We will continue current dose of medication compliance checked, updated agreement provided today.  Urine drug screen compliance confirmed.  Will follow in 3 months.  Providing each month with the correct medication script at the time it is due, and not before.

## 2019-12-03 NOTE — Progress Notes (Signed)
Subjective:  Patient ID: Mary Roman, female    DOB: [birth year]  Age: 49 y.o. MRN: [id number]  CC:  Chief Complaint  Patient presents with  . pain management      HPI  Back Pain This is a chronic problem. The current episode started more than 1 year ago. The problem occurs intermittently. The problem has been waxing and waning since onset. The pain is present in the lumbar spine and thoracic spine. The quality of the pain is described as aching (dull, at times stabbing with certain movements). The pain radiates to the right thigh and right knee. The pain is at a severity of 8/10 (today 0/10). The patient is experiencing no pain. The pain is the same all the time. The symptoms are aggravated by bending, coughing, sitting and position (sneezing ). Stiffness is present all day. Pertinent negatives include no abdominal pain, bladder incontinence, bowel incontinence, chest pain, dysuria, fever, headaches, leg pain, numbness, paresis, paresthesias, pelvic pain, perianal numbness, tingling, weakness or weight loss. Risk factors include lack of exercise, obesity and sedentary lifestyle. She has tried heat, bed rest, NSAIDs and walking for the symptoms. The treatment provided significant relief.    Indication for chronic opioid: herniated disc -chronic back pain Medication and dose: Norco-5-325mg  by mouth once daily # pills per month: 30 Last UDS date: 11/12/2019 Opioid Treatment Agreement signed (Y/N): Y- today updated Opioid Treatment Agreement last reviewed with patient:  Yes, updated today [place] reviewed this encounter (include red flags): YesReviewed, no red flags  Today patient denies signs and symptoms of COVID 19 infection including fever, chills, cough, shortness of breath, and headache. Past Medical, Surgical, Social History, Allergies, and Medications have been Reviewed.   Past Medical History:  Diagnosis Date  . Anemia   . Anxiety   . Chronic back pain   . Diabetes  mellitus, type II ([place])   . Headache(784.0)   . Hypertension   . Obesity   . Palpitation   . Sickle cell trait ([place])   . Thyromegaly     Current Meds  Medication Sig  . ARIPiprazole (ABILIFY) 10 MG tablet Take 1 tablet (10 mg total) by mouth daily.  Marland Kitchen aspirin EC 81 MG tablet Take 1 tablet (81 mg total) by mouth daily.  Marland Kitchen buPROPion (WELLBUTRIN XL) 300 MG 24 hr tablet Take 1 tablet (300 mg total) by mouth daily.  Marland Kitchen glipiZIDE (GLUCOTROL XL) 10 MG 24 hr tablet Take 1 tablet (10 mg total) by mouth daily with breakfast.  . glipiZIDE (GLUCOTROL XL) 5 MG 24 hr tablet Take 1 tablet (5 mg total) by mouth daily with breakfast.  . HYDROcodone-acetaminophen (NORCO/VICODIN) 5-325 MG tablet Take one tablet by mouth at bedtime , for back pain  . megestrol (MEGACE) 40 MG tablet TAKE 3 TABLETS BY MOUTH ONCE DAILY FOR 5 DAYS, THEN TAKE 2 TABLETS ONCE DAILY FOR 5 DAYS, THEN TAKE 1 TABLET ONCE DAILY.  . metoprolol tartrate (LOPRESSOR) 25 MG tablet TAKE 1 TABLET BY MOUTH DAILY.  Marland Kitchen potassium chloride (KLOR-CON 10) 10 MEQ tablet Take 1 tablet (10 mEq total) by mouth 2 (two) times daily.  . rosuvastatin (CRESTOR) 5 MG tablet Take 1 tablet (5 mg total) by mouth daily.  . temazepam (RESTORIL) 30 MG capsule Take 1 capsule (30 mg total) by mouth at bedtime as needed for sleep.  Marland Kitchen triamterene-hydrochlorothiazide (MAXZIDE-25) 37.5-25 MG tablet Take 1 tablet by mouth daily.  Marland Kitchen venlafaxine XR (EFFEXOR-XR) 150 MG 24 hr capsule 225  mg daily (150 mg + 75 mg)  . venlafaxine XR (EFFEXOR-XR) 75 MG 24 hr capsule 225 mg daily (150 mg + 75 mg)  . [DISCONTINUED] temazepam (RESTORIL) 30 MG capsule TAKE 1 CAPSULE BY MOUTH ONCE DAILY AT BEDTIME AS NEEDED FOR SLEEP (Patient taking differently: Take 30 mg by mouth at bedtime as needed for sleep. )    ROS:  Review of Systems  Constitutional: Negative for fever and weight loss.  Cardiovascular: Negative for chest pain.  Gastrointestinal: Negative for abdominal pain and bowel  incontinence.  Genitourinary: Negative for bladder incontinence, dysuria and pelvic pain.  Musculoskeletal: Positive for back pain.  Neurological: Negative for tingling, weakness, numbness, headaches and paresthesias.     Objective:   Today's Vitals: BP 124/84   Temp (!) 97.3 F (36.3 C)   Resp 15   Ht 5\' 5"  (1.651 m)   Wt 211 lb (95.7 kg)   SpO2 97%   BMI 35.11 kg/m  Vitals with BMI 12/03/2019 10/23/2019 09/25/2019  Height 5\' 5"  5\' 5"  5\' 5"   Weight 211 lbs 218 lbs 217 lbs  BMI 35.11 [id number]  Systolic [id number]  Diastolic 84 90 90  Pulse - - -  Some encounter information is confidential and restricted. Go to Review Flowsheets activity to see all data.     Physical Exam Vitals and nursing note reviewed.  Constitutional:      Appearance: Normal appearance. She is obese.  HENT:     Head: Normocephalic and atraumatic.     Right Ear: External ear normal.     Left Ear: External ear normal.     Mouth/Throat:     Comments: Mask in place Eyes:     General:        Right eye: No discharge.        Left eye: No discharge.     Conjunctiva/sclera: Conjunctivae normal.  Cardiovascular:     Rate and Rhythm: Normal rate and regular rhythm.     Pulses: Normal pulses.     Heart sounds: Normal heart sounds.  Pulmonary:     Effort: Pulmonary effort is normal.     Breath sounds: Normal breath sounds.  Musculoskeletal:     Cervical back: Normal range of motion and neck supple.     Comments: Full ROM   Skin:    General: Skin is warm.  Neurological:     General: No focal deficit present.     Mental Status: She is alert and oriented to person, place, and time.  Psychiatric:        Mood and Affect: Mood normal.        Behavior: Behavior normal.        Thought Content: Thought content normal.        Judgment: Judgment normal.     Assessment   1. Encounter for chronic pain management   2. Chronic right-sided low back pain with right-sided sciatica     Tests ordered No  orders of the defined types were placed in this encounter.    Plan: Please see assessment and plan per problem list above.   No orders of the defined types were placed in this encounter.   Patient to follow-up in 3 months  Perlie Mayo, NP

## 2019-12-03 NOTE — Patient Instructions (Signed)
I appreciate the opportunity to provide you with care for your health and wellness. Today we discussed: pain management   Follow up: 3 months   No labs or referrals today  Release form for your previous MRI  Medications will be sent in to your desired pharmacy.  Continue to focus on walking daily as tolerated. Eating a well balanced diet and drinking plenty of water.  Please continue to practice social distancing to keep you, your family, and our community safe.  If you must go out, please wear a mask and practice good handwashing.  It was a pleasure to see you and I look forward to continuing to work together on your health and well-being. Please do not hesitate to call the office if you need care or have questions about your care.  Have a wonderful day and week. With Gratitude, Cherly Beach, DNP, AGNP-BC

## 2019-12-10 ENCOUNTER — Other Ambulatory Visit (HOSPITAL_COMMUNITY): Payer: Self-pay | Admitting: Psychiatry

## 2019-12-10 ENCOUNTER — Telehealth (HOSPITAL_COMMUNITY): Payer: Self-pay

## 2019-12-10 ENCOUNTER — Encounter: Payer: Self-pay | Admitting: Family Medicine

## 2019-12-10 DIAGNOSIS — F3342 Major depressive disorder, recurrent, in full remission: Secondary | ICD-10-CM

## 2019-12-10 MED ORDER — BUPROPION HCL ER (XL) 300 MG PO TB24: 300.0000 mg | ORAL_TABLET | Freq: Every day | ORAL | 0 refills | Status: DC

## 2019-12-10 NOTE — Telephone Encounter (Signed)
Ordered. Please advise her to make follow up appointment. I will not be able to do any more refill without evaluation.

## 2019-12-10 NOTE — Telephone Encounter (Signed)
Patient called requesting a refill on her Wellbutrin XL 300mg . Patient was last seen by Dr. Toy Care on 08/07/19 and has no future appts. Made. Thank you.

## 2019-12-11 ENCOUNTER — Other Ambulatory Visit: Payer: Self-pay | Admitting: Family Medicine

## 2019-12-11 ENCOUNTER — Telehealth: Payer: Self-pay | Admitting: *Deleted

## 2019-12-11 DIAGNOSIS — M5441 Lumbago with sciatica, right side: Secondary | ICD-10-CM

## 2019-12-11 DIAGNOSIS — G8929 Other chronic pain: Secondary | ICD-10-CM

## 2019-12-11 MED ORDER — HYDROCODONE-ACETAMINOPHEN 5-325 MG PO TABS: ORAL_TABLET | ORAL | 0 refills | Status: DC

## 2019-12-11 NOTE — Telephone Encounter (Signed)
Pt aware.

## 2019-12-11 NOTE — Telephone Encounter (Signed)
Pt is in need of hyrocodone [facility]. She was in office 12-03-19 and wanted to know if this could be sent

## 2019-12-11 NOTE — Telephone Encounter (Signed)
It was not due until tomorrow. I have sent in 1 month supply. When it is time for another refill she can call us and we will refill. Thank you

## 2019-12-11 NOTE — Telephone Encounter (Signed)
Pt advised of recommendation with verbal understanding  

## 2019-12-18 ENCOUNTER — Ambulatory Visit: Payer: No Typology Code available for payment source | Admitting: Family Medicine

## 2019-12-18 NOTE — Telephone Encounter (Signed)
Had relayed message - LVM

## 2020-01-11 ENCOUNTER — Telehealth: Payer: Self-pay

## 2020-01-11 NOTE — Telephone Encounter (Signed)
Called and left message to call back (told her to get labs done)

## 2020-01-11 NOTE — Telephone Encounter (Signed)
Pt is calling stating that she is out of pain medication , 3 month follow up scheduled.  Please advise and call the pt

## 2020-01-11 NOTE — Telephone Encounter (Signed)
Jarrett Soho only called in 1 month

## 2020-01-11 NOTE — Telephone Encounter (Signed)
She needs to get the fasting labs ordered in March before his can be refilled. If she has eaten today, please change to non fasting and still put lipid, so that she can get thios done!

## 2020-01-12 ENCOUNTER — Other Ambulatory Visit: Payer: Self-pay

## 2020-01-12 DIAGNOSIS — E1159 Type 2 diabetes mellitus with other circulatory complications: Secondary | ICD-10-CM

## 2020-01-12 DIAGNOSIS — E785 Hyperlipidemia, unspecified: Secondary | ICD-10-CM

## 2020-01-12 DIAGNOSIS — D509 Iron deficiency anemia, unspecified: Secondary | ICD-10-CM

## 2020-01-12 DIAGNOSIS — I10 Essential (primary) hypertension: Secondary | ICD-10-CM

## 2020-01-12 NOTE — Telephone Encounter (Signed)
Sent message via mychart to get labs done before med will be refilled

## 2020-01-12 NOTE — Telephone Encounter (Signed)
Sent lab order to [facility] and she will have done tomorrow

## 2020-01-13 ENCOUNTER — Other Ambulatory Visit: Payer: Self-pay | Admitting: Family Medicine

## 2020-01-13 ENCOUNTER — Other Ambulatory Visit: Payer: Self-pay

## 2020-01-13 ENCOUNTER — Other Ambulatory Visit (HOSPITAL_COMMUNITY)
Admission: RE | Admit: 2020-01-13 | Discharge: 2020-01-13 | Disposition: A | Discharge status: Home / Self Care | Payer: No Typology Code available for payment source | Source: Ambulatory Visit | Attending: Family Medicine | Admitting: Family Medicine

## 2020-01-13 DIAGNOSIS — I10 Essential (primary) hypertension: Secondary | ICD-10-CM | POA: Insufficient documentation

## 2020-01-13 DIAGNOSIS — E1159 Type 2 diabetes mellitus with other circulatory complications: Secondary | ICD-10-CM | POA: Diagnosis present

## 2020-01-13 DIAGNOSIS — E785 Hyperlipidemia, unspecified: Secondary | ICD-10-CM | POA: Insufficient documentation

## 2020-01-13 LAB — COMPREHENSIVE METABOLIC PANEL
ALT: 25 U/L (ref 0–44)
AST: 18 U/L (ref 15–41)
Albumin: 4 g/dL (ref 3.5–5.0)
Alkaline Phosphatase: 72 U/L (ref 38–126)
Anion gap: 13 (ref 5–15)
BUN: 10 mg/dL (ref 6–20)
CO2: 27 mmol/L (ref 22–32)
Calcium: 9.5 mg/dL (ref 8.9–10.3)
Chloride: 100 mmol/L (ref 98–111)
Creatinine, Ser: 1.21 mg/dL — ABNORMAL HIGH (ref 0.44–1.00)
GFR calc Af Amer: >60 mL/min (ref >60)
GFR calc non Af Amer: 52 mL/min — ABNORMAL LOW (ref >60)
Glucose, Bld: 206 mg/dL — ABNORMAL HIGH (ref 70–99)
Potassium: 3.3 mmol/L — ABNORMAL LOW (ref 3.5–5.1)
Sodium: 140 mmol/L (ref 135–145)
Total Bilirubin: 1 mg/dL (ref 0.3–1.2)
Total Protein: 8 g/dL (ref 6.5–8.1)

## 2020-01-13 LAB — CBC
HCT: 41.8 % (ref 36.0–46.0)
Hemoglobin: 13.2 g/dL (ref 12.0–15.0)
MCH: 28.4 pg (ref 26.0–34.0)
MCHC: 31.6 g/dL (ref 30.0–36.0)
MCV: 90.1 fL (ref 80.0–100.0)
Platelets: 472 10*3/uL — ABNORMAL HIGH (ref 150–400)
RBC: 4.64 MIL/uL (ref 3.87–5.11)
RDW: 12.8 % (ref 11.5–15.5)
WBC: 6.6 10*3/uL (ref 4.0–10.5)
nRBC: 0 % (ref 0.0–0.2)

## 2020-01-13 LAB — LIPID PANEL
Cholesterol: 105 mg/dL (ref 0–200)
HDL: 26 mg/dL — ABNORMAL LOW (ref >40)
LDL Cholesterol: 65 mg/dL (ref 0–99)
Total CHOL/HDL Ratio: 4 RATIO
Triglycerides: 70 mg/dL (ref <150)
VLDL: 14 mg/dL (ref 0–40)

## 2020-01-13 MED ORDER — HYDROCODONE-ACETAMINOPHEN 5-325 MG PO TABS: ORAL_TABLET | ORAL | 0 refills | Status: AC

## 2020-01-14 ENCOUNTER — Other Ambulatory Visit: Payer: Self-pay | Admitting: Family Medicine

## 2020-01-14 ENCOUNTER — Ambulatory Visit: Payer: No Typology Code available for payment source | Admitting: Nutrition

## 2020-01-14 ENCOUNTER — Other Ambulatory Visit: Payer: Self-pay

## 2020-01-14 DIAGNOSIS — Z1159 Encounter for screening for other viral diseases: Secondary | ICD-10-CM

## 2020-01-14 LAB — HEMOGLOBIN A1C
Hgb A1c MFr Bld: 8.8 % — ABNORMAL HIGH (ref 4.8–5.6)
Mean Plasma Glucose: 206 mg/dL

## 2020-01-14 MED ORDER — GLIPIZIDE ER 10 MG PO TB24: ORAL_TABLET | ORAL | 1 refills | Status: DC

## 2020-01-14 MED FILL — glipiZIDE ER 10 MG TB24: 10 | 90 days supply | Qty: 180 | Fill #0

## 2020-02-04 ENCOUNTER — Other Ambulatory Visit: Payer: Self-pay | Admitting: Family Medicine

## 2020-02-04 MED FILL — VENLAFAXINE HCL ER 75 MG CA: 75 | 90 days supply | Qty: 90 | Fill #0

## 2020-02-04 MED FILL — METOPROLOL TARTRATE 25 MG T: 25 | 90 days supply | Qty: 90 | Fill #0

## 2020-02-04 MED FILL — ARIPIPRAZOLE 10 MG TABS: 10 | 90 days supply | Qty: 90 | Fill #0

## 2020-02-10 ENCOUNTER — Other Ambulatory Visit (HOSPITAL_COMMUNITY): Payer: Self-pay | Admitting: Psychiatry

## 2020-02-10 DIAGNOSIS — F3342 Major depressive disorder, recurrent, in full remission: Secondary | ICD-10-CM

## 2020-02-10 MED ORDER — VENLAFAXINE HCL ER 75 MG PO CP24: ORAL_CAPSULE | ORAL | 0 refills | Status: DC

## 2020-02-11 NOTE — Progress Notes (Deleted)
[facility] MD/PA/NP OP Progress Note  02/11/2020 9:14 AM Mary Roman  MRN:  [id number]  Chief Complaint:  HPI: *** Visit Diagnosis: No diagnosis found.  Past Psychiatric History: Please see initial evaluation for full details. I have reviewed the history. No updates at this time.     Past Medical History:  Past Medical History:  Diagnosis Date  . Absolute anemia 06/23/2018   Added automatically from request for surgery [id number]  . Anemia   . Anxiety   . Backache 12/24/2007  . Chronic back pain   . Diabetes mellitus, type II ([place])   . Headache(784.0)   . Hypertension   . Metabolic syndrome X 1/96/2229  . Obesity   . Palpitation   . Sickle cell trait ([place])   . Thyromegaly   . Unspecified vitamin D deficiency 02/19/2013    Past Surgical History:  Procedure Laterality Date  . DILATATION AND CURETTAGE/HYSTEROSCOPY WITH MINERVA N/A 03/25/2019   Procedure: DILATATION AND CURETTAGE/HYSTEROSCOPY WITH  ATTEMPTED MINERVA ENDOMETRIAL ABLATION;  Surgeon: Florian Buff, MD;  Location: [facility];  Service: Gynecology;  Laterality: N/A;  . None      Family Psychiatric History: Please see initial evaluation for full details. I have reviewed the history. No updates at this time.     Family History:  Family History  Problem Relation Age of Onset  . Diabetes Mother   . Hypertension Mother   . Stroke Mother   . Colon cancer Mother   . Alcohol abuse Mother   . Cancer - Colon Mother 34  . Pneumonia Father   . Hypertension Sister   . Anxiety disorder Sister   . Hypertension Sister   . Leukemia Sister   . Diabetes Maternal Grandmother   . Hypertension Maternal Grandmother   . Hypertension Maternal Grandfather   . Heart attack Maternal Uncle     Social History:  Social History   Socioeconomic History  . Marital status: Single    Spouse name: Not on file  . Number of children: 2  . Years of education: Not on file  . Highest education level: Not on file  Occupational History  .  Occupation: employed in medical office   Tobacco Use  . Smoking status: Never Smoker  . Smokeless tobacco: Never Used  Vaping Use  . Vaping Use: Never used  Substance and Sexual Activity  . Alcohol use: No    Comment: 01-17-2017 per pt no  . Drug use: No    Comment: 01-17-2017 per pt no  . Sexual activity: Yes    Partners: Male    Birth control/protection: Other-see comments    Comment: bf had vasectomy  Other Topics Concern  . Not on file  Social History Narrative  . Not on file   Social Determinants of Health   Financial Resource Strain:   . Difficulty of Paying Living Expenses:   Food Insecurity:   . Worried About Charity fundraiser in the Last Year:   . Arboriculturist in the Last Year:   Transportation Needs:   . Film/video editor (Medical):   Marland Kitchen Lack of Transportation (Non-Medical):   Physical Activity:   . Days of Exercise per Week:   . Minutes of Exercise per Session:   Stress:   . Feeling of Stress :   Social Connections:   . Frequency of Communication with Friends and Family:   . Frequency of Social Gatherings with Friends and Family:   . Attends Religious Services:   .  Active Member of Clubs or Organizations:   . Attends Archivist Meetings:   Marland Kitchen Marital Status:     Allergies: No Known Allergies  Metabolic Disorder Labs: Lab Results  Component Value Date   HGBA1C 8.8 (H) 01/13/2020   MPG 206 01/13/2020   MPG 237.43 08/13/2019   No results found for: PROLACTIN Lab Results  Component Value Date   CHOL 105 01/13/2020   TRIG 70 01/13/2020   HDL 26 (L) 01/13/2020   CHOLHDL 4.0 01/13/2020   VLDL 14 01/13/2020   LDLCALC 65 01/13/2020   LDLCALC 106 (H) 05/13/2019   Lab Results  Component Value Date   TSH 1.916 05/13/2019   TSH 1.23 03/06/2018    Therapeutic Level Labs: No results found for: LITHIUM No results found for: VALPROATE No components found for:  CBMZ  Current Medications: Current Outpatient Medications  Medication  Sig Dispense Refill  . ARIPiprazole (ABILIFY) 10 MG tablet Take 1 tablet (10 mg total) by mouth daily. 90 tablet 0  . aspirin EC 81 MG tablet Take 1 tablet (81 mg total) by mouth daily. 90 tablet 3  . buPROPion (WELLBUTRIN XL) 300 MG 24 hr tablet Take 1 tablet (300 mg total) by mouth daily. 90 tablet 0  . glipiZIDE (GLUCOTROL XL) 10 MG 24 hr tablet Take two tablets by mouth once daily with breakfast 180 tablet 1  . [START ON 02/12/2020] HYDROcodone-acetaminophen (NORCO) 5-325 MG tablet Take one tablet by mouth once daily for back pain 30 tablet 0  . HYDROcodone-acetaminophen (NORCO/VICODIN) 5-325 MG tablet Take one tablet by mouth once daily for back pain 30 tablet 0  . megestrol (MEGACE) 40 MG tablet TAKE 3 TABLETS BY MOUTH ONCE DAILY FOR 5 DAYS, THEN TAKE 2 TABLETS ONCE DAILY FOR 5 DAYS, THEN TAKE 1 TABLET ONCE DAILY. 30 tablet 5  . metoprolol tartrate (LOPRESSOR) 25 MG tablet TAKE 1 TABLET BY MOUTH DAILY. 90 tablet 0  . potassium chloride (KLOR-CON 10) 10 MEQ tablet Take 1 tablet (10 mEq total) by mouth 2 (two) times daily. 60 tablet 0  . rosuvastatin (CRESTOR) 5 MG tablet Take 1 tablet (5 mg total) by mouth daily. 90 tablet 3  . temazepam (RESTORIL) 30 MG capsule Take 1 capsule (30 mg total) by mouth at bedtime as needed for sleep. 30 capsule 3  . triamterene-hydrochlorothiazide (MAXZIDE-25) 37.5-25 MG tablet Take 1 tablet by mouth daily. 90 tablet 3  . venlafaxine XR (EFFEXOR-XR) 150 MG 24 hr capsule 225 mg daily (150 mg + 75 mg) 90 capsule 0  . venlafaxine XR (EFFEXOR-XR) 75 MG 24 hr capsule 225 mg daily (150 mg + 75 mg) 90 capsule 0   No current facility-administered medications for this visit.     Musculoskeletal: Strength & Muscle Tone: N/A Gait & Station: N/A Patient leans: N/A  Psychiatric Specialty Exam: Review of Systems  There were no vitals taken for this visit.There is no height or weight on file to calculate BMI.  General Appearance: {Appearance:22683}  Eye Contact:  {BHH  EYE CONTACT:22684}  Speech:  Clear and Coherent  Volume:  Normal  Mood:  {BHH MOOD:22306}  Affect:  {Affect (PAA):22687}  Thought Process:  Coherent  Orientation:  Full (Time, Place, and Person)  Thought Content: Logical   Suicidal Thoughts:  {ST/HT (PAA):22692}  Homicidal Thoughts:  {ST/HT (PAA):22692}  Memory:  Immediate;   Good  Judgement:  {Judgement (PAA):22694}  Insight:  {Insight (PAA):22695}  Psychomotor Activity:  Normal  Concentration:  Concentration: Good and Attention  Span: Good  Recall:  Good  Fund of Knowledge: Good  Language: Good  Akathisia:  No  Handed:  Right  AIMS (if indicated): not done  Assets:  Communication Skills Desire for Improvement  ADL's:  Intact  Cognition: WNL  Sleep:  {BHH GOOD/FAIR/POOR:22877}   Screenings: GAD-7     Counselor from 05/15/2018 in [facility] Office Visit from 01/08/2017 in [facility]  Total GAD-7 Score 17 14    PHQ2-9     Office Visit from 12/03/2019 in [facility] Office Visit from 08/04/2019 in [facility] Office Visit from 05/13/2019 in [facility] Office Visit from 02/03/2019 in [facility] Office Visit from 11/06/2018 in [facility]  PHQ-2 Total Score 0 2 4 1 3   PHQ-9 Total Score 2 13 13  -- 12       Assessment and Plan:  Mary Roman is a 49 y.o. year old female with a history of depression, diabetes, who presents for follow up appointment for below.     # MDD, moderate, recurrent without psychotic features She continues to report depressive symptoms and anxiety, in the context of non adherence to medication due to financial strain.  Psychosocial stressors include work and conflict with her fianc.  Given she recently reinitiated her medication, will not make any adjustments to her medication at this time.  We will continue venlafaxine to target depression and anxiety.  Will continue bupropion as  adjunctive treatment for depression.  She has no known history of seizure.  We will continue Abilify as adjunctive treatment for depression.  Discussed potential metabolic side effects and EPS.  Coached behavioral activation.  She agrees to try crafting, and going outside with her fianc on the weekend.  She will greatly benefit from CBT; she has an upcoming appointment with her therapist.   # Insomnia She has prominent fatigue, middle insomnia and snores at night.  She has never been evaluated for sleep study.  According to the patient, she still does not hear back from anybody about her appointment.  We will send a reminder to the clinic again.   Plan  1.Continuevenlafaxine 225 mg daily (150 mg + 75 mg) 2. Continue bupropion 300 mg daily (side effect from higher dose) 3.Continue Abilify 10mg  daily 4. Referral to sleep study(will contact the clinic for reminder again) 5.Next appointment:in December -temazepam 30 mg at night as needed for sleep -on gabapentin for pain and hydroxyzine  Past trials of medication: fluoxetine,duloxetine,buspirone, Trazodone ("weird" feeling) temazepam, Xanax  The patient demonstrates the following risk factors for suicide: Chronic risk factors for suicide include: psychiatric disorder of depression, anxietyand chronic pain. Acute risk factorsfor suicide include: N/A. Protective factorsfor this patient include: positive social support, responsibility to others (children, family), coping skills and hope for the future. Considering these factors, the overall suicide risk at this point appears to be low. Patient isappropriate for outpatient follow up.  [name], MD 02/11/2020, 9:14 AM

## 2020-02-16 ENCOUNTER — Telehealth (HOSPITAL_COMMUNITY): Payer: No Typology Code available for payment source | Admitting: Psychiatry

## 2020-03-09 ENCOUNTER — Ambulatory Visit: Payer: No Typology Code available for payment source | Admitting: Family Medicine

## 2020-03-09 NOTE — Progress Notes (Signed)
Virtual Visit via Video Note  I connected with Mary Roman on 03/15/20 at  9:00 AM EDT by a video enabled telemedicine application and verified that I am speaking with the correct person using two identifiers.   I discussed the limitations of evaluation and management by telemedicine and the availability of in person appointments. The patient expressed understanding and agreed to proceed.      I discussed the assessment and treatment plan with the patient. The patient was provided an opportunity to ask questions and all were answered. The patient agreed with the plan and demonstrated an understanding of the instructions.   The patient was advised to call back or seek an in-person evaluation if the symptoms worsen or if the condition fails to improve as anticipated.  Location: patient- home, provider- home office   I provided 20 minutes of non-face-to-face time during this encounter.   [name], MD     [facility] MD/PA/NP OP Progress Note  03/15/2020 9:29 AM Mary Roman  MRN:  [id number]  Chief Complaint:  Chief Complaint    Depression; Follow-up     HPI:  This is a follow-up appointment for depression.  She is not seen since last January.  She states that she lost her brother in June, and her niece in November.  She feels that she is going back to [employer].  She stays in the bed most of the time on weekends.  She has not been able to be attentive to her children.  She missed work last week due to significant fatigue.  She has been able to present herself well when she is at work, stating that she is trying to hide what is going on with her.  She states that the relationship is "going," while it is not perfect. She states that there were a lot of things going on including losses, and she missed to have follow-up as it was even difficult for her to get out of the bed.  She agrees to make routine schedule especially when she has worsening in depression.  She has initial and  middle insomnia.  She feels fatigue.  She has difficulty in concentration.  She has good appetite; she denies any weight change.  She has anhedonia.  She denies SI.  She feels anxious, tense and irritable.  She denies panic attacks.  She has been taking medication regularly.   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 MDD (major depressive disorder), recurrent episode, moderate (HCC)  F33.1 ARIPiprazole (ABILIFY) 15 MG tablet    buPROPion (WELLBUTRIN XL) 300 MG 24 hr tablet  2. Insomnia, unspecified type  G47.00 Ambulatory referral to Neurology    Past Psychiatric History: Please see initial evaluation for full details. I have reviewed the history. No updates at this time.     Past Medical History:  Past Medical History:  Diagnosis Date  . Absolute anemia 06/23/2018   Added automatically from request for surgery [id number]  . Anemia   . Anxiety   . Backache 12/24/2007  . Chronic back pain   . Diabetes mellitus, type II ([place])   . Headache(784.0)   . Hypertension   . Metabolic syndrome X 0/17/5102  . Obesity   . Palpitation   . Sickle cell trait ([place])   . Thyromegaly   . Unspecified vitamin D deficiency 02/19/2013  Past Surgical History:  Procedure Laterality Date  . DILATATION AND CURETTAGE/HYSTEROSCOPY WITH MINERVA N/A 03/25/2019   Procedure: DILATATION AND CURETTAGE/HYSTEROSCOPY WITH  ATTEMPTED MINERVA ENDOMETRIAL ABLATION;  Surgeon: Florian Buff, MD;  Location: [facility];  Service: Gynecology;  Laterality: N/A;  . None      Family Psychiatric History: Please see initial evaluation for full details. I have reviewed the history. No updates at this time.     Family History:  Family History  Problem Relation Age of Onset  . Diabetes Mother   . Hypertension Mother   . Stroke Mother   . Colon cancer Mother    . Alcohol abuse Mother   . Cancer - Colon Mother 3  . Pneumonia Father   . Hypertension Sister   . Anxiety disorder Sister   . Hypertension Sister   . Leukemia Sister   . Diabetes Maternal Grandmother   . Hypertension Maternal Grandmother   . Hypertension Maternal Grandfather   . Heart attack Maternal Uncle     Social History:  Social History   Socioeconomic History  . Marital status: Single    Spouse name: Not on file  . Number of children: 2  . Years of education: Not on file  . Highest education level: Not on file  Occupational History  . Occupation: employed in medical office   Tobacco Use  . Smoking status: Never Smoker  . Smokeless tobacco: Never Used  Vaping Use  . Vaping Use: Never used  Substance and Sexual Activity  . Alcohol use: No    Comment: 01-17-2017 per pt no  . Drug use: No    Comment: 01-17-2017 per pt no  . Sexual activity: Yes    Partners: Male    Birth control/protection: Other-see comments    Comment: bf had vasectomy  Other Topics Concern  . Not on file  Social History Narrative  . Not on file   Social Determinants of Health   Financial Resource Strain:   . Difficulty of Paying Living Expenses:   Food Insecurity:   . Worried About Charity fundraiser in the Last Year:   . Arboriculturist in the Last Year:   Transportation Needs:   . Film/video editor (Medical):   Marland Kitchen Lack of Transportation (Non-Medical):   Physical Activity:   . Days of Exercise per Week:   . Minutes of Exercise per Session:   Stress:   . Feeling of Stress :   Social Connections:   . Frequency of Communication with Friends and Family:   . Frequency of Social Gatherings with Friends and Family:   . Attends Religious Services:   . Active Member of Clubs or Organizations:   . Attends Archivist Meetings:   Marland Kitchen Marital Status:     Allergies: No Known Allergies  Metabolic Disorder Labs: Lab Results  Component Value Date   HGBA1C 8.8 (H) 01/13/2020    MPG 206 01/13/2020   MPG 237.43 08/13/2019   No results found for: PROLACTIN Lab Results  Component Value Date   CHOL 105 01/13/2020   TRIG 70 01/13/2020   HDL 26 (L) 01/13/2020   CHOLHDL 4.0 01/13/2020   VLDL 14 01/13/2020   LDLCALC 65 01/13/2020   LDLCALC 106 (H) 05/13/2019   Lab Results  Component Value Date   TSH 1.916 05/13/2019   TSH 1.23 03/06/2018    Therapeutic Level Labs: No results found for: LITHIUM No results found for: VALPROATE No components found for:  CBMZ  Current Medications: Current Outpatient Medications  Medication Sig Dispense Refill  . ARIPiprazole (ABILIFY) 15 MG tablet Take 1 tablet (15 mg total) by mouth daily. 30 tablet 0  . aspirin EC 81 MG tablet Take 1 tablet (81 mg total) by mouth daily. 90 tablet 3  . buPROPion (WELLBUTRIN XL) 300 MG 24 hr tablet Take 1 tablet (300 mg total) by mouth daily. 90 tablet 0  . glipiZIDE (GLUCOTROL XL) 10 MG 24 hr tablet Take two tablets by mouth once daily with breakfast 180 tablet 1  . megestrol (MEGACE) 40 MG tablet TAKE 3 TABLETS BY MOUTH ONCE DAILY FOR 5 DAYS, THEN TAKE 2 TABLETS ONCE DAILY FOR 5 DAYS, THEN TAKE 1 TABLET ONCE DAILY. 30 tablet 5  . metoprolol tartrate (LOPRESSOR) 25 MG tablet TAKE 1 TABLET BY MOUTH DAILY. 90 tablet 0  . potassium chloride (KLOR-CON 10) 10 MEQ tablet Take 1 tablet (10 mEq total) by mouth 2 (two) times daily. 60 tablet 0  . rosuvastatin (CRESTOR) 5 MG tablet Take 1 tablet (5 mg total) by mouth daily. 90 tablet 3  . temazepam (RESTORIL) 30 MG capsule Take 1 capsule (30 mg total) by mouth at bedtime as needed for sleep. 30 capsule 3  . triamterene-hydrochlorothiazide (MAXZIDE-25) 37.5-25 MG tablet Take 1 tablet by mouth daily. 90 tablet 3  . venlafaxine XR (EFFEXOR-XR) 150 MG 24 hr capsule 225 mg daily (150 mg + 75 mg) 90 capsule 0  . venlafaxine XR (EFFEXOR-XR) 75 MG 24 hr capsule 225 mg daily (150 mg + 75 mg) 90 capsule 0   No current facility-administered medications for this  visit.     Musculoskeletal: Strength & Muscle Tone: N/A Gait & Station: N/A Patient leans: N/A  Psychiatric Specialty Exam: Review of Systems  Psychiatric/Behavioral: Positive for decreased concentration, dysphoric mood and sleep disturbance. Negative for agitation, behavioral problems, confusion, hallucinations, self-injury and suicidal ideas. The patient is nervous/anxious. The patient is not hyperactive.   All other systems reviewed and are negative.   There were no vitals taken for this visit.There is no height or weight on file to calculate BMI.  General Appearance: Fairly Groomed  Eye Contact:  Good  Speech:  Clear and Coherent  Volume:  Normal  Mood:  Depressed  Affect:  Appropriate, Congruent, Restricted and down  Thought Process:  Coherent  Orientation:  Full (Time, Place, and Person)  Thought Content: Logical   Suicidal Thoughts:  No  Homicidal Thoughts:  No  Mary:  Immediate;   Good  Judgement:  Good  Insight:  Fair  Psychomotor Activity:  Normal  Concentration:  Concentration: Good and Attention Span: Good  Recall:  Good  Fund of Knowledge: Good  Language: Good  Akathisia:  No  Handed:  Right  AIMS (if indicated): not done  Assets:  Communication Skills Desire for Improvement  ADL's:  Intact  Cognition: WNL  Sleep:  Poor   Screenings: GAD-7     Counselor from 05/15/2018 in [place] Office Visit from 01/08/2017 in [facility]  Total GAD-7 Score 17 14    PHQ2-9     Office Visit from 12/03/2019 in [facility] Office Visit from 08/04/2019 in [facility] Office Visit from 05/13/2019 in [facility] Office Visit from 02/03/2019 in [facility] Office Visit from 11/06/2018 in [facility]  PHQ-2 Total Score 0 2 4 1 3   PHQ-9 Total Score 2 13 13  -- 12  Assessment and Plan:  Mary Roman is a 49 y.o. year old female with a history of  depression, diabetes, who presents for follow up appointment for below.   1. MDD (major depressive disorder), recurrent episode, moderate ([place]) She reports worsening in depressive symptoms and anxiety in the context of loss of her brother and niece.  Will uptitrate Abilify as adjunctive treatment for depression.  Discussed potential metabolic side effect and EPS.  Will continue venlafaxine to target depression and anxiety.  We will continue bupropion as adjunctive treatment for depression.  She has no known history of seizure.  Coached behavioral activation.  She will greatly benefit from CBT/supportive therapy; the front desk to contact for therapy follow-up.   2. Insomnia, unspecified type She continues to have prominent fatigue, middle insomnia, and snoring.  Will make referral for evaluation of sleep study again.   Plan I have reviewed and updated plans as below 1.Continuevenlafaxine 225 mg daily (150 mg + 75 mg) - refill left 2. Continue bupropion 300 mg daily (side effect from higher dose) 3.Increase Abilify 15mg  daily 4. Referral to sleep study  5.Next appointment:9/14 at 8:30 for 20 mins, video -on temazepam 30 mg at night as needed for sleep -on gabapentin for pain and hydroxyzine  Past trials of medication: fluoxetine,duloxetine,buspirone, Trazodone ("weird" feeling) temazepam, Xanax  The patient demonstrates the following risk factors for suicide: Chronic risk factors for suicide include: psychiatric disorder of depression, anxietyand chronic pain. Acute risk factorsfor suicide include: N/A. Protective factorsfor this patient include: positive social support, responsibility to others (children, family), coping skills and hope for the future. Considering these factors, the overall suicide risk at this point appears to be low. Patient isappropriate for outpatient follow up.  [name], MD 03/15/2020, 9:29 AM

## 2020-03-14 ENCOUNTER — Other Ambulatory Visit: Payer: Self-pay | Admitting: Family Medicine

## 2020-03-14 ENCOUNTER — Telehealth: Payer: Self-pay

## 2020-03-14 NOTE — Telephone Encounter (Signed)
Need to have visit based on recent urine tox screen

## 2020-03-14 NOTE — Telephone Encounter (Signed)
Had to reschedule her appt to Sept 15 because she has to have an 8:00 appt. Will be out of hydrocodone tomorrow. Wants to know if you will send enough to last until her appt to [facility]. Please advise

## 2020-03-15 ENCOUNTER — Other Ambulatory Visit: Payer: Self-pay

## 2020-03-15 ENCOUNTER — Telehealth (INDEPENDENT_AMBULATORY_CARE_PROVIDER_SITE_OTHER): Payer: No Typology Code available for payment source | Admitting: Psychiatry

## 2020-03-15 ENCOUNTER — Encounter (HOSPITAL_COMMUNITY): Payer: Self-pay | Admitting: Psychiatry

## 2020-03-15 DIAGNOSIS — G4700 Insomnia, unspecified: Secondary | ICD-10-CM | POA: Diagnosis not present

## 2020-03-15 DIAGNOSIS — F331 Major depressive disorder, recurrent, moderate: Secondary | ICD-10-CM

## 2020-03-15 MED ORDER — ARIPIPRAZOLE 15 MG PO TABS: 15.0000 mg | ORAL_TABLET | Freq: Every day | ORAL | 0 refills | Status: DC

## 2020-03-15 MED ORDER — BUPROPION HCL ER (XL) 300 MG PO TB24: 300.0000 mg | ORAL_TABLET | Freq: Every day | ORAL | 0 refills | Status: DC

## 2020-03-15 MED FILL — ARIPiprazole 15 MG TABS: 15 | 30 days supply | Qty: 30 | Fill #0

## 2020-03-15 MED FILL — BUPROPION HCL ER (XL) 300 M: 300 | 90 days supply | Qty: 90 | Fill #0

## 2020-03-15 NOTE — Patient Instructions (Signed)
1.Continuevenlafaxine 225 mg daily (150 mg + 75 mg) - refill left 2. Continue bupropion 300 mg daily  3.Increase Abilify 15mg  daily 4. Referral to sleep study  5.Next appointment:9/14 at 8:30

## 2020-03-15 NOTE — Telephone Encounter (Signed)
Appt scheduled

## 2020-03-17 MED FILL — TRIAMTERENE-HCTZ 37.5-25 MG: 37.5-25 | 90 days supply | Qty: 90 | Fill #2

## 2020-03-17 MED FILL — ROSUVASTATIN CALCIUM 5 MG T: 5 | 90 days supply | Qty: 90 | Fill #2

## 2020-03-23 ENCOUNTER — Encounter: Payer: Self-pay | Admitting: Family Medicine

## 2020-03-23 ENCOUNTER — Telehealth (INDEPENDENT_AMBULATORY_CARE_PROVIDER_SITE_OTHER): Payer: No Typology Code available for payment source | Admitting: Family Medicine

## 2020-03-23 ENCOUNTER — Other Ambulatory Visit: Payer: Self-pay

## 2020-03-23 VITALS — BP 124/84 | Ht 65.0 in | Wt 211.0 lb

## 2020-03-23 DIAGNOSIS — G8929 Other chronic pain: Secondary | ICD-10-CM

## 2020-03-23 DIAGNOSIS — E1159 Type 2 diabetes mellitus with other circulatory complications: Secondary | ICD-10-CM

## 2020-03-23 DIAGNOSIS — I10 Essential (primary) hypertension: Secondary | ICD-10-CM

## 2020-03-23 NOTE — Progress Notes (Signed)
Virtual Visit via Telephone Note  I connected with Mary Roman on 03/23/20 at  8:40 AM EDT by telephone and verified that I am speaking with the correct person using two identifiers.  Location: Patient: mother's home Provider: offiice   I discussed the limitations, risks, security and privacy concerns of performing an evaluation and management service by telephone and the availability of in person appointments. I also discussed with the patient that there may be a patient responsible charge related to this service. The patient expressed understanding and agreed to proceed.   History of Present Illness:   Appointment to dicuss recent UDS which shows xanax in urine wen this is not prescribed to pt. States she has been undr  Increased stress, recently lost her brother. I explain that even if Mary Roman is the case this represents a breech in pain contract and re iterate the danger of taking prescription medication that is not your  Own ,especially when already on pain contract. States he knows, understands and will not repeat. I have made it clear that any other breech of contract will result in termination of the contract Observations/Objective: BP 124/84   Ht 5\' 5"  (1.651 m)   Wt 211 lb (95.7 kg)   BMI 35.11 kg/m  Good communication with no confusion and intact memory. Alert and oriented x 3 No signs of respiratory distress during speech    Assessment and Plan:  Encounter for chronic pain management Encounter as documented, 1 month only prescribed of pain medication wiuth clear understanding that if contract is breeched again this will lead to pain management being responsible for prescribing Patient understands and is in agreement  Type 2 diabetes mellitus with vascular disease ([place]) Mary Roman is reminded of the importance of commitment to daily physical activity for 30 minutes or more, as able and the need to limit carbohydrate intake to 30 to 60 grams per meal to help with  blood sugar control.   The need to take medication as prescribed, test blood sugar as directed, and to call between visits if there is a concern that blood sugar is uncontrolled is also discussed.   Mary Roman is reminded of the importance of daily foot exam, annual eye examination, and good blood sugar, blood pressure and cholesterol control. Uncontrolled, needs to start testing dialy and workin gon lifestyle Evaluate fully at next visit  Diabetic Labs Latest Ref Rng & Units 01/13/2020 08/13/2019 08/06/2019 05/13/2019 03/20/2019  HbA1c 4.8 - 5.6 % 8.8(H) 9.9(H) - - 8.4(H)  Microalbumin Not Estab. ug/mL - - - 37.3(H) -  Micro/Creat Ratio 0 - 29 mg/g creat - - - 12 -  Chol 0 - 200 mg/dL 105 - - 150 -  HDL >40 mg/dL 26(L) - - 29(L) -  Calc LDL 0 - 99 mg/dL 65 - - 106(H) -  Triglycerides <150 mg/dL 70 - - 75 -  Creatinine 0.44 - 1.00 mg/dL 1.21(H) - 0.94 1.00 1.01(H)   BP/Weight 03/23/2020 12/03/2019 10/23/2019 09/25/2019 08/20/2019 08/04/2019 80/32/1224  Systolic BP 825 003 704 888 916 945 038  Diastolic BP 84 84 90 90 90 82 82  Wt. (Lbs) 211 211 218 217 217 215 215  BMI 35.11 35.11 36.28 36.11 36.11 35.78 35.78  Some encounter information is confidential and restricted. Go to Review Flowsheets activity to see all data.   Foot/eye exam completion dates Latest Ref Rng & Units 02/03/2019 01/23/2018  Eye Exam No Retinopathy - No Retinopathy  Foot Form Completion - Done -  Essential hypertension Controlled, no change in medication   Morbid obesity ([place]) Obesity linked with hyprtension, diabetes, depression  Patient re-educated about  the importance of commitment to a  minimum of 150 minutes of exercise per week as able.  The importance of healthy food choices with portion control discussed, as well as eating regularly and within a 12 hour window most days. The need to choose "clean , green" food 50 to 75% of the time is discussed, as well as to make water the primary drink and set a  goal of 64 ounces water daily.    Weight /BMI 03/23/2020 12/03/2019 10/23/2019  WEIGHT 211 lb 211 lb 218 lb  HEIGHT 5\' 5"  5\' 5"  5\' 5"   BMI 35.11 kg/m2 35.11 kg/m2 36.28 kg/m2  Some encounter information is confidential and restricted. Go to Review Flowsheets activity to see all data.       Follow Up Instructions:    I discussed the assessment and treatment plan with the patient. The patient was provided an opportunity to ask questions and all were answered. The patient agreed with the plan and demonstrated an understanding of the instructions.   The patient was advised to call back or seek an in-person evaluation if the symptoms worsen or if the condition fails to improve as anticipated.  I provided 10 minutes of non-face-to-face time during this encounter.   Tula Nakayama, MD

## 2020-03-23 NOTE — Patient Instructions (Addendum)
F/u in September as before, call if you need me sooner, in office for foot exam and glycohb    We will prescribed meter and test strips for you, for once daily testing, if not covered, you will need to get OTC test supplies, because you DO need to test once daily, your sugar is uncontrolled  You NEED to stick with the treatment plan and contract to maintain your current medications. Additional screening will be done in the future randomly  It is important that you exercise regularly at least 30 minutes 5 times a week. If you develop chest pain, have severe difficulty breathing, or feel very tired, stop exercising immediately and seek medical attention  Think about what you will eat, plan ahead. Choose " clean, green, fresh or frozen" over canned, processed or packaged foods which are more sugary, salty and fatty. 70 to 75% of food eaten should be vegetables and fruit. Three meals at set times with snacks allowed between meals, but they must be fruit or vegetables. Aim to eat over a 12 hour period , example 7 am to 7 pm, and STOP after  your last meal of the day. Drink water,generally about 64 ounces per day, no other drink is as healthy. Fruit juice is best enjoyed in a healthy way, by EATING the fruit. Thanks for choosing [facility], we consider it a privelige to serve you.

## 2020-03-24 ENCOUNTER — Encounter: Payer: Self-pay | Admitting: Family Medicine

## 2020-03-24 ENCOUNTER — Other Ambulatory Visit: Payer: Self-pay | Admitting: Family Medicine

## 2020-03-24 MED ORDER — HYDROCODONE-ACETAMINOPHEN 5-325 MG PO TABS: ORAL_TABLET | ORAL | 0 refills | Status: DC

## 2020-03-25 ENCOUNTER — Encounter: Payer: Self-pay | Admitting: Family Medicine

## 2020-03-25 NOTE — Assessment & Plan Note (Signed)
Obesity linked with hyprtension, diabetes, depression  Patient re-educated about  the importance of commitment to a  minimum of 150 minutes of exercise per week as able.  The importance of healthy food choices with portion control discussed, as well as eating regularly and within a 12 hour window most days. The need to choose "clean , green" food 50 to 75% of the time is discussed, as well as to make water the primary drink and set a goal of 64 ounces water daily.    Weight /BMI 03/23/2020 12/03/2019 10/23/2019  WEIGHT 211 lb 211 lb 218 lb  HEIGHT 5\' 5"  5\' 5"  5\' 5"   BMI 35.11 kg/m2 35.11 kg/m2 36.28 kg/m2  Some encounter information is confidential and restricted. Go to Review Flowsheets activity to see all data.

## 2020-03-25 NOTE — Assessment & Plan Note (Signed)
Mary Roman is reminded of the importance of commitment to daily physical activity for 30 minutes or more, as able and the need to limit carbohydrate intake to 30 to 60 grams per meal to help with blood sugar control.   The need to take medication as prescribed, test blood sugar as directed, and to call between visits if there is a concern that blood sugar is uncontrolled is also discussed.   Mary Roman is reminded of the importance of daily foot exam, annual eye examination, and good blood sugar, blood pressure and cholesterol control. Uncontrolled, needs to start testing dialy and workin gon lifestyle Evaluate fully at next visit  Diabetic Labs Latest Ref Rng & Units 01/13/2020 08/13/2019 08/06/2019 05/13/2019 03/20/2019  HbA1c 4.8 - 5.6 % 8.8(H) 9.9(H) - - 8.4(H)  Microalbumin Not Estab. ug/mL - - - 37.3(H) -  Micro/Creat Ratio 0 - 29 mg/g creat - - - 12 -  Chol 0 - 200 mg/dL 105 - - 150 -  HDL >40 mg/dL 26(L) - - 29(L) -  Calc LDL 0 - 99 mg/dL 65 - - 106(H) -  Triglycerides <150 mg/dL 70 - - 75 -  Creatinine 0.44 - 1.00 mg/dL 1.21(H) - 0.94 1.00 1.01(H)   BP/Weight 03/23/2020 12/03/2019 10/23/2019 09/25/2019 08/20/2019 08/04/2019 46/56/8127  Systolic BP 517 001 749 449 675 916 384  Diastolic BP 84 84 90 90 90 82 82  Wt. (Lbs) 211 211 218 217 217 215 215  BMI 35.11 35.11 36.28 36.11 36.11 35.78 35.78  Some encounter information is confidential and restricted. Go to Review Flowsheets activity to see all data.   Foot/eye exam completion dates Latest Ref Rng & Units 02/03/2019 01/23/2018  Eye Exam No Retinopathy - No Retinopathy  Foot Form Completion - Done -

## 2020-03-25 NOTE — Assessment & Plan Note (Signed)
Controlled, no change in medication  

## 2020-03-25 NOTE — Assessment & Plan Note (Signed)
Encounter as documented, 1 month only prescribed of pain medication wiuth clear understanding that if contract is breeched again this will lead to pain management being responsible for prescribing Patient understands and is in agreement

## 2020-04-06 MED FILL — VENLAFAXINE HCL ER 150 MG C: 150 | 90 days supply | Qty: 90 | Fill #1

## 2020-04-06 NOTE — Progress Notes (Deleted)
BH MD/PA/NP OP Progress Note  04/06/2020 2:56 PM Mary Roman  MRN:  [id number]  Chief Complaint:  HPI: *** Visit Diagnosis: No diagnosis found.  Past Psychiatric History: Please see initial evaluation for full details. I have reviewed the history. No updates at this time.     Past Medical History:  Past Medical History:  Diagnosis Date  . Absolute anemia 06/23/2018   Added automatically from request for surgery [id number]  . Anemia   . Anxiety   . Backache 12/24/2007  . Chronic back pain   . Diabetes mellitus, type II ([place])   . Headache(784.0)   . Hypertension   . Metabolic syndrome X 9/79/8921  . Obesity   . Palpitation   . Sickle cell trait ([place])   . Thyromegaly   . Unspecified vitamin D deficiency 02/19/2013    Past Surgical History:  Procedure Laterality Date  . DILATATION AND CURETTAGE/HYSTEROSCOPY WITH MINERVA N/A 03/25/2019   Procedure: DILATATION AND CURETTAGE/HYSTEROSCOPY WITH  ATTEMPTED MINERVA ENDOMETRIAL ABLATION;  Surgeon: Florian Buff, MD;  Location: [facility];  Service: Gynecology;  Laterality: N/A;  . None      Family Psychiatric History: Please see initial evaluation for full details. I have reviewed the history. No updates at this time.     Family History:  Family History  Problem Relation Age of Onset  . Diabetes Mother   . Hypertension Mother   . Stroke Mother   . Colon cancer Mother   . Alcohol abuse Mother   . Cancer - Colon Mother 42  . Pneumonia Father   . Hypertension Sister   . Anxiety disorder Sister   . Hypertension Sister   . Leukemia Sister   . Diabetes Maternal Grandmother   . Hypertension Maternal Grandmother   . Hypertension Maternal Grandfather   . Heart attack Maternal Uncle     Social History:  Social History   Socioeconomic History  . Marital status: Single    Spouse name: Not on file  . Number of children: 2  . Years of education: Not on file  . Highest education level: Not on file  Occupational History  .  Occupation: employed in medical office   Tobacco Use  . Smoking status: Never Smoker  . Smokeless tobacco: Never Used  Vaping Use  . Vaping Use: Never used  Substance and Sexual Activity  . Alcohol use: No    Comment: 01-17-2017 per pt no  . Drug use: No    Comment: 01-17-2017 per pt no  . Sexual activity: Yes    Partners: Male    Birth control/protection: Other-see comments    Comment: bf had vasectomy  Other Topics Concern  . Not on file  Social History Narrative  . Not on file   Social Determinants of Health   Financial Resource Strain:   . Difficulty of Paying Living Expenses: Not on file  Food Insecurity:   . Worried About Charity fundraiser in the Last Year: Not on file  . Ran Out of Food in the Last Year: Not on file  Transportation Needs:   . Lack of Transportation (Medical): Not on file  . Lack of Transportation (Non-Medical): Not on file  Physical Activity:   . Days of Exercise per Week: Not on file  . Minutes of Exercise per Session: Not on file  Stress:   . Feeling of Stress : Not on file  Social Connections:   . Frequency of Communication with Friends and Family: Not on  file  . Frequency of Social Gatherings with Friends and Family: Not on file  . Attends Religious Services: Not on file  . Active Member of Clubs or Organizations: Not on file  . Attends Archivist Meetings: Not on file  . Marital Status: Not on file    Allergies: No Known Allergies  Metabolic Disorder Labs: Lab Results  Component Value Date   HGBA1C 8.8 (H) 01/13/2020   MPG 206 01/13/2020   MPG 237.43 08/13/2019   No results found for: PROLACTIN Lab Results  Component Value Date   CHOL 105 01/13/2020   TRIG 70 01/13/2020   HDL 26 (L) 01/13/2020   CHOLHDL 4.0 01/13/2020   VLDL 14 01/13/2020   LDLCALC 65 01/13/2020   LDLCALC 106 (H) 05/13/2019   Lab Results  Component Value Date   TSH 1.916 05/13/2019   TSH 1.23 03/06/2018    Therapeutic Level Labs: No results  found for: LITHIUM No results found for: VALPROATE No components found for:  CBMZ  Current Medications: Current Outpatient Medications  Medication Sig Dispense Refill  . ARIPiprazole (ABILIFY) 15 MG tablet Take 1 tablet (15 mg total) by mouth daily. 30 tablet 0  . aspirin EC 81 MG tablet Take 1 tablet (81 mg total) by mouth daily. 90 tablet 3  . buPROPion (WELLBUTRIN XL) 300 MG 24 hr tablet Take 1 tablet (300 mg total) by mouth daily. 90 tablet 0  . glipiZIDE (GLUCOTROL XL) 10 MG 24 hr tablet Take two tablets by mouth once daily with breakfast 180 tablet 1  . HYDROcodone-acetaminophen (NORCO/VICODIN) 5-325 MG tablet Take one tablet by mouth once daily for back pain 30 tablet 0  . megestrol (MEGACE) 40 MG tablet TAKE 3 TABLETS BY MOUTH ONCE DAILY FOR 5 DAYS, THEN TAKE 2 TABLETS ONCE DAILY FOR 5 DAYS, THEN TAKE 1 TABLET ONCE DAILY. 30 tablet 5  . metoprolol tartrate (LOPRESSOR) 25 MG tablet TAKE 1 TABLET BY MOUTH DAILY. 90 tablet 0  . potassium chloride (KLOR-CON 10) 10 MEQ tablet Take 1 tablet (10 mEq total) by mouth 2 (two) times daily. 60 tablet 0  . rosuvastatin (CRESTOR) 5 MG tablet Take 1 tablet (5 mg total) by mouth daily. 90 tablet 3  . temazepam (RESTORIL) 30 MG capsule Take 1 capsule (30 mg total) by mouth at bedtime as needed for sleep. 30 capsule 3  . triamterene-hydrochlorothiazide (MAXZIDE-25) 37.5-25 MG tablet Take 1 tablet by mouth daily. 90 tablet 3  . venlafaxine XR (EFFEXOR-XR) 150 MG 24 hr capsule 225 mg daily (150 mg + 75 mg) 90 capsule 0  . venlafaxine XR (EFFEXOR-XR) 75 MG 24 hr capsule 225 mg daily (150 mg + 75 mg) 90 capsule 0   No current facility-administered medications for this visit.     Musculoskeletal: Strength & Muscle Tone: N/A Gait & Station: N/A Patient leans: N/A  Psychiatric Specialty Exam: Review of Systems  There were no vitals taken for this visit.There is no height or weight on file to calculate BMI.  General Appearance: {Appearance:22683}   Eye Contact:  {BHH EYE CONTACT:22684}  Speech:  Clear and Coherent  Volume:  Normal  Mood:  {BHH MOOD:22306}  Affect:  {Affect (PAA):22687}  Thought Process:  Coherent  Orientation:  Full (Time, Place, and Person)  Thought Content: Logical   Suicidal Thoughts:  {ST/HT (PAA):22692}  Homicidal Thoughts:  {ST/HT (PAA):22692}  Memory:  Immediate;   Good  Judgement:  {Judgement (PAA):22694}  Insight:  {Insight (PAA):22695}  Psychomotor Activity:  Normal  Concentration:  Concentration: Good and Attention Span: Good  Recall:  Good  Fund of Knowledge: Good  Language: Good  Akathisia:  No  Handed:  Right  AIMS (if indicated): N/A  Assets:  Communication Skills Desire for Improvement  ADL's:  Intact  Cognition: WNL  Sleep:  {BHH GOOD/FAIR/POOR:22877}   Screenings: GAD-7     Counselor from 05/15/2018 in [facility] Visit from 01/08/2017 in [facility]  Total GAD-7 Score 17 14    PHQ2-9     Video Visit from 03/23/2020 in [facility] Office Visit from 12/03/2019 in [facility] Office Visit from 08/04/2019 in [facility] Office Visit from 05/13/2019 in [facility] Office Visit from 02/03/2019 in [facility]  PHQ-2 Total Score 2 0 2 4 1   PHQ-9 Total Score 6 2 13 13  --       Assessment and Plan:  DAVY FAUGHT is a 49 y.o. year old female with a history of depression, diabetes , who presents for follow up appointment for below.    1. MDD (major depressive disorder), recurrent episode, moderate ([place]) She reports worsening in depressive symptoms and anxiety in the context of loss of her brother and niece.  Will uptitrate Abilify as adjunctive treatment for depression.  Discussed potential metabolic side effect and EPS.  Will continue venlafaxine to target depression and anxiety.  We will continue bupropion as adjunctive treatment for depression.  She has no known  history of seizure.  Coached behavioral activation.  She will greatly benefit from CBT/supportive therapy; the front desk to contact for therapy follow-up.   2. Insomnia, unspecified type She continues to have prominent fatigue, middle insomnia, and snoring.  Will make referral for evaluation of sleep study again.   Plan  1.Continuevenlafaxine 225 mg daily (150 mg + 75 mg) - refill left 2. Continue bupropion 300 mg daily (side effect from higher dose) 3.IncreaseAbilify 15mg  daily 4. Referral to sleep study  5.Next appointment:9/14 at 8:30 for 20 mins, video -on temazepam 30 mg at night as needed for sleep -on gabapentin for pain and hydroxyzine  Past trials of medication: fluoxetine,duloxetine,buspirone, Trazodone ("weird" feeling) temazepam, Xanax  The patient demonstrates the following risk factors for suicide: Chronic risk factors for suicide include: psychiatric disorder of depression, anxietyand chronic pain. Acute risk factorsfor suicide include: N/A. Protective factorsfor this patient include: positive social support, responsibility to others (children, family), coping skills and hope for the future. Considering these factors, the overall suicide risk at this point appears to be low. Patient isappropriate for outpatient follow up.   [name], MD 04/06/2020, 2:56 PM

## 2020-04-11 ENCOUNTER — Other Ambulatory Visit: Payer: Self-pay

## 2020-04-11 ENCOUNTER — Other Ambulatory Visit (HOSPITAL_COMMUNITY)
Admission: RE | Admit: 2020-04-11 | Discharge: 2020-04-11 | Disposition: A | Discharge status: Home / Self Care | Payer: No Typology Code available for payment source | Source: Ambulatory Visit | Attending: Family Medicine | Admitting: Family Medicine

## 2020-04-11 DIAGNOSIS — E1159 Type 2 diabetes mellitus with other circulatory complications: Secondary | ICD-10-CM

## 2020-04-11 DIAGNOSIS — Z1159 Encounter for screening for other viral diseases: Secondary | ICD-10-CM

## 2020-04-11 DIAGNOSIS — E785 Hyperlipidemia, unspecified: Secondary | ICD-10-CM

## 2020-04-11 DIAGNOSIS — I10 Essential (primary) hypertension: Secondary | ICD-10-CM

## 2020-04-12 ENCOUNTER — Telehealth (HOSPITAL_COMMUNITY): Payer: No Typology Code available for payment source | Admitting: Psychiatry

## 2020-04-13 ENCOUNTER — Ambulatory Visit: Payer: No Typology Code available for payment source | Admitting: Family Medicine

## 2020-04-18 ENCOUNTER — Other Ambulatory Visit: Payer: Self-pay

## 2020-04-18 ENCOUNTER — Other Ambulatory Visit (HOSPITAL_COMMUNITY)
Admission: RE | Admit: 2020-04-18 | Discharge: 2020-04-18 | Disposition: A | Discharge status: Home / Self Care | Payer: No Typology Code available for payment source | Source: Ambulatory Visit | Attending: Family Medicine | Admitting: Family Medicine

## 2020-04-18 ENCOUNTER — Other Ambulatory Visit: Payer: Self-pay | Admitting: Family Medicine

## 2020-04-18 DIAGNOSIS — I10 Essential (primary) hypertension: Secondary | ICD-10-CM | POA: Diagnosis present

## 2020-04-18 DIAGNOSIS — E785 Hyperlipidemia, unspecified: Secondary | ICD-10-CM | POA: Diagnosis present

## 2020-04-18 DIAGNOSIS — Z1159 Encounter for screening for other viral diseases: Secondary | ICD-10-CM | POA: Diagnosis present

## 2020-04-18 LAB — COMPREHENSIVE METABOLIC PANEL
ALT: 16 U/L (ref 0–44)
AST: 14 U/L — ABNORMAL LOW (ref 15–41)
Albumin: 3.6 g/dL (ref 3.5–5.0)
Alkaline Phosphatase: 71 U/L (ref 38–126)
Anion gap: 10 (ref 5–15)
BUN: 7 mg/dL (ref 6–20)
CO2: 27 mmol/L (ref 22–32)
Calcium: 8.9 mg/dL (ref 8.9–10.3)
Chloride: 103 mmol/L (ref 98–111)
Creatinine, Ser: 0.97 mg/dL (ref 0.44–1.00)
GFR calc Af Amer: >60 mL/min (ref >60)
GFR calc non Af Amer: >60 mL/min (ref >60)
Glucose, Bld: 202 mg/dL — ABNORMAL HIGH (ref 70–99)
Potassium: 2.9 mmol/L — ABNORMAL LOW (ref 3.5–5.1)
Sodium: 140 mmol/L (ref 135–145)
Total Bilirubin: 0.8 mg/dL (ref 0.3–1.2)
Total Protein: 7.5 g/dL (ref 6.5–8.1)

## 2020-04-18 LAB — CBC
HCT: 39.8 % (ref 36.0–46.0)
Hemoglobin: 12.7 g/dL (ref 12.0–15.0)
MCH: 29.1 pg (ref 26.0–34.0)
MCHC: 31.9 g/dL (ref 30.0–36.0)
MCV: 91.3 fL (ref 80.0–100.0)
Platelets: 441 10*3/uL — ABNORMAL HIGH (ref 150–400)
RBC: 4.36 MIL/uL (ref 3.87–5.11)
RDW: 12.9 % (ref 11.5–15.5)
WBC: 7.4 10*3/uL (ref 4.0–10.5)
nRBC: 0 % (ref 0.0–0.2)

## 2020-04-18 LAB — LIPID PANEL
Cholesterol: 126 mg/dL (ref 0–200)
HDL: 30 mg/dL — ABNORMAL LOW (ref >40)
LDL Cholesterol: 84 mg/dL (ref 0–99)
Total CHOL/HDL Ratio: 4.2 RATIO
Triglycerides: 59 mg/dL (ref <150)
VLDL: 12 mg/dL (ref 0–40)

## 2020-04-18 LAB — HEMOGLOBIN A1C
Hgb A1c MFr Bld: 8.6 % — ABNORMAL HIGH (ref 4.8–5.6)
Mean Plasma Glucose: 200.12 mg/dL

## 2020-04-18 LAB — HEPATITIS C ANTIBODY: HCV Ab: NONREACTIVE

## 2020-04-18 MED ORDER — POTASSIUM CHLORIDE CRYS ER 20 MEQ PO TBCR: 20.0000 meq | EXTENDED_RELEASE_TABLET | Freq: Two times a day (BID) | ORAL | 3 refills | Status: DC

## 2020-04-19 ENCOUNTER — Other Ambulatory Visit: Payer: Self-pay | Admitting: Family Medicine

## 2020-04-19 ENCOUNTER — Other Ambulatory Visit: Payer: Self-pay

## 2020-04-19 ENCOUNTER — Encounter: Payer: Self-pay | Admitting: Family Medicine

## 2020-04-19 ENCOUNTER — Ambulatory Visit (INDEPENDENT_AMBULATORY_CARE_PROVIDER_SITE_OTHER): Payer: No Typology Code available for payment source | Admitting: Family Medicine

## 2020-04-19 VITALS — BP 149/88 | HR 76 | Ht 65.0 in | Wt 202.0 lb

## 2020-04-19 DIAGNOSIS — F331 Major depressive disorder, recurrent, moderate: Secondary | ICD-10-CM

## 2020-04-19 DIAGNOSIS — G8929 Other chronic pain: Secondary | ICD-10-CM

## 2020-04-19 DIAGNOSIS — E669 Obesity, unspecified: Secondary | ICD-10-CM

## 2020-04-19 DIAGNOSIS — I10 Essential (primary) hypertension: Secondary | ICD-10-CM | POA: Diagnosis not present

## 2020-04-19 DIAGNOSIS — E785 Hyperlipidemia, unspecified: Secondary | ICD-10-CM

## 2020-04-19 DIAGNOSIS — Z23 Encounter for immunization: Secondary | ICD-10-CM | POA: Diagnosis not present

## 2020-04-19 DIAGNOSIS — E1159 Type 2 diabetes mellitus with other circulatory complications: Secondary | ICD-10-CM | POA: Diagnosis not present

## 2020-04-19 MED ORDER — TRIAMTERENE-HCTZ 37.5-25 MG PO TABS: ORAL_TABLET | ORAL | 3 refills | Status: DC

## 2020-04-19 MED ORDER — SITAGLIP PHOS-METFORMIN HCL ER 100-1000 MG PO TB24: ORAL_TABLET | ORAL | 5 refills | Status: DC

## 2020-04-19 MED ORDER — BLOOD GLUCOSE METER KIT: PACK | 0 refills | Status: AC

## 2020-04-19 MED FILL — FREESTYLE LITE TEST STRIP: 50 days supply | Qty: 50 | Fill #0

## 2020-04-19 MED FILL — JANUMET XR 100-1,000 MG TAB: 100-1000 | 30 days supply | Qty: 30 | Fill #0

## 2020-04-19 MED FILL — FREESTYLE LITE METER: W/DEVICE | 30 days supply | Qty: 1 | Fill #0

## 2020-04-19 MED FILL — FREESTYLE LANCETS: 90 days supply | Qty: 100 | Fill #0

## 2020-04-19 NOTE — Patient Instructions (Signed)
F/U in office with mD in 3 months, for glyco Hb , call if ty you need me sooner  Flu vaccine today  Nurse please get eye exam report done in [place] , 'My eye dr" earlier this year  Non fasting chem 7 and EGFr re evaluate low potassium in 2 weeks  PLS start higher dose of potassium vital  Increase triamterene to 1.5 tablets once daily, blood pressure is high  ADD janumet one daily f to glipizide 10 mg two daily as blood sugar is still too high, also NEED to reduce sodas and change snack choices as well  Testing meter and supplies tobe prescribed today  It is important that you exercise regularly at least 30 minutes 5 times a week. If you develop chest pain, have severe difficulty breathing, or feel very tired, stop exercising immediately and seek medical attention   Thanks for choosing [facility], we consider it a privelige to serve you.   Keep working at health, you are improving, so be encouraged  Goal for fasting blood sugar ranges from 80 to 120 and 2 hours after any meal or at bedtime should be between 130 to 170.

## 2020-04-23 ENCOUNTER — Encounter: Payer: Self-pay | Admitting: Family Medicine

## 2020-04-23 MED ORDER — HYDROCODONE-ACETAMINOPHEN 5-325 MG PO TABS: ORAL_TABLET | ORAL | 0 refills | Status: AC

## 2020-04-23 NOTE — Assessment & Plan Note (Signed)
The patient's Controlled Substance registry is reviewed and compliance confirmed. Adequacy of  Pain control and level of function is assessed. Medication dosing is adjusted as deemed appropriate. Twelve weeks of medication is prescribed , with a follow up appointment between 11 to 12 weeks .  

## 2020-04-23 NOTE — Assessment & Plan Note (Signed)
Mary Roman is reminded of the importance of commitment to daily physical activity for 30 minutes or more, as able and the need to limit carbohydrate intake to 30 to 60 grams per meal to help with blood sugar control.   The need to take medication as prescribed, test blood sugar as directed, and to call between visits if there is a concern that blood sugar is uncontrolled is also discussed.   Mary Roman is reminded of the importance of daily foot exam, annual eye examination, and good blood sugar, blood pressure and cholesterol control.  Diabetic Labs Latest Ref Rng & Units 04/18/2020 01/13/2020 08/13/2019 08/06/2019 05/13/2019  HbA1c 4.8 - 5.6 % 8.6(H) 8.8(H) 9.9(H) - -  Microalbumin Not Estab. ug/mL - - - - 37.3(H)  Micro/Creat Ratio 0 - 29 mg/g creat - - - - 12  Chol 0 - 200 mg/dL 126 105 - - 150  HDL >40 mg/dL 30(L) 26(L) - - 29(L)  Calc LDL 0 - 99 mg/dL 84 65 - - 106(H)  Triglycerides <150 mg/dL 59 70 - - 75  Creatinine 0.44 - 1.00 mg/dL 0.97 1.21(H) - 0.94 1.00   BP/Weight 04/19/2020 03/23/2020 12/03/2019 10/23/2019 09/25/2019 01/25/6380 02/03/1164  Systolic BP 790 383 338 329 191 660 600  Diastolic BP 88 84 84 90 90 90 82  Wt. (Lbs) 202 211 211 218 217 217 215  BMI 33.61 35.11 35.11 36.28 36.11 36.11 35.78  Some encounter information is confidential and restricted. Go to Review Flowsheets activity to see all data.   Foot/eye exam completion dates Latest Ref Rng & Units 04/19/2020 02/03/2019  Eye Exam No Retinopathy - -  Foot Form Completion - Done Done      Add janumet, not at goal

## 2020-04-23 NOTE — Progress Notes (Signed)
Mary Roman     MRN: [id number]      DOB: 22-Feb-1971   HPI Ms. Bossi is here for follow up and re-evaluation of chronic medical conditions, medication management and review of any available recent lab and radiology data.  Preventive health is updated, specifically  Cancer screening and Immunization.   Questions or concerns regarding consultations or procedures which the PT has had in the interim are  addressed. The PT denies any adverse reactions to current medications since the last visit.  Still not exercising regularly and not testing, needs supplies Denies polyuria, polydipsia, blurred vision , or hypoglycemic episodes.  ROS Denies recent fever or chills. Denies sinus pressure, nasal congestion, ear pain or sore throat. Denies chest congestion, productive cough or wheezing. Denies chest pains, palpitations and leg swelling Denies abdominal pain, nausea, vomiting,diarrhea or constipation.   Denies dysuria, frequency, hesitancy or incontinence. Denies joint pain, swelling and limitation in mobility. Denies headaches, seizures, numbness, or tingling. Denies uncontrolled  depression, anxiety or insomnia. Denies skin break down or rash.   PE  BP (!) 149/88   Pulse 76   Ht 5\' 5"  (1.651 m)   Wt 202 lb (91.6 kg)   BMI 33.61 kg/m   Patient alert and oriented and in no cardiopulmonary distress.  HEENT: No facial asymmetry, EOMI,     Neck supple .  Chest: Clear to auscultation bilaterally.  CVS: S1, S2 no murmurs, no S3.Regular rate.  ABD: Soft non tender.   Ext: No edema  MS: Adequate ROM spine, shoulders, hips and knees.  Skin: Intact, no ulcerations or rash noted.  Psych: Good eye contact, normal affect. Memory intact not anxious or depressed appearing.  CNS: CN 2-12 intact, power,  normal throughout.no focal deficits noted.   Assessment & Plan  Essential hypertension Uncontrolled, increase dose of maxzide DASH diet and commitment to daily physical  activity for a minimum of 30 minutes discussed and encouraged, as a part of hypertension management. The importance of attaining a healthy weight is also discussed.  BP/Weight 04/19/2020 03/23/2020 12/03/2019 10/23/2019 09/25/2019 2/58/5277 02/28/4234  Systolic BP 361 443 154 008 676 195 093  Diastolic BP 88 84 84 90 90 90 82  Wt. (Lbs) 202 211 211 218 217 217 215  BMI 33.61 35.11 35.11 36.28 36.11 36.11 35.78  Some encounter information is confidential and restricted. Go to Review Flowsheets activity to see all data.       Type 2 diabetes mellitus with vascular disease ([place]) Ms. Gurevich is reminded of the importance of commitment to daily physical activity for 30 minutes or more, as able and the need to limit carbohydrate intake to 30 to 60 grams per meal to help with blood sugar control.   The need to take medication as prescribed, test blood sugar as directed, and to call between visits if there is a concern that blood sugar is uncontrolled is also discussed.   Ms. Starrett is reminded of the importance of daily foot exam, annual eye examination, and good blood sugar, blood pressure and cholesterol control.  Diabetic Labs Latest Ref Rng & Units 04/18/2020 01/13/2020 08/13/2019 08/06/2019 05/13/2019  HbA1c 4.8 - 5.6 % 8.6(H) 8.8(H) 9.9(H) - -  Microalbumin Not Estab. ug/mL - - - - 37.3(H)  Micro/Creat Ratio 0 - 29 mg/g creat - - - - 12  Chol 0 - 200 mg/dL 126 105 - - 150  HDL >40 mg/dL 30(L) 26(L) - - 29(L)  Calc LDL 0 - 99 mg/dL  84 65 - - 106(H)  Triglycerides <150 mg/dL 59 70 - - 75  Creatinine 0.44 - 1.00 mg/dL 0.97 1.21(H) - 0.94 1.00   BP/Weight 04/19/2020 03/23/2020 12/03/2019 10/23/2019 09/25/2019 10/12/1759 6/0/7371  Systolic BP 062 694 854 627 035 009 381  Diastolic BP 88 84 84 90 90 90 82  Wt. (Lbs) 202 211 211 218 217 217 215  BMI 33.61 35.11 35.11 36.28 36.11 36.11 35.78  Some encounter information is confidential and restricted. Go to Review Flowsheets activity to see all data.    Foot/eye exam completion dates Latest Ref Rng & Units 04/19/2020 02/03/2019  Eye Exam No Retinopathy - -  Foot Form Completion - Done Done      Add janumet, not at goal  Obesity (BMI 30.0-34.9)  Patient re-educated about  the importance of commitment to a  minimum of 150 minutes of exercise per week as able.  The importance of healthy food choices with portion control discussed, as well as eating regularly and within a 12 hour window most days. The need to choose "clean , green" food 50 to 75% of the time is discussed, as well as to make water the primary drink and set a goal of 64 ounces water daily.    Weight /BMI 04/19/2020 03/23/2020 12/03/2019  WEIGHT 202 lb 211 lb 211 lb  HEIGHT 5\' 5"  5\' 5"  5\' 5"   BMI 33.61 kg/m2 35.11 kg/m2 35.11 kg/m2  Some encounter information is confidential and restricted. Go to Review Flowsheets activity to see all data.      Dyslipidemia (high LDL; low HDL) Hyperlipidemia:Low fat diet discussed and encouraged.   Lipid Panel  Lab Results  Component Value Date   CHOL 126 04/18/2020   HDL 30 (L) 04/18/2020   LDLCALC 84 04/18/2020   TRIG 59 04/18/2020   CHOLHDL 4.2 04/18/2020       Major depressive disorder, recurrent episode, moderate ([facility]) Improving, managed by Psych  Encounter for chronic pain management The patient's Controlled Substance registry is reviewed and compliance confirmed. Adequacy of  Pain control and level of function is assessed. Medication dosing is adjusted as deemed appropriate. Twelve weeks of medication is prescribed , with a follow up appointment between 11 to 12 weeks .

## 2020-04-23 NOTE — Assessment & Plan Note (Signed)
Hyperlipidemia:Low fat diet discussed and encouraged.   Lipid Panel  Lab Results  Component Value Date   CHOL 126 04/18/2020   HDL 30 (L) 04/18/2020   LDLCALC 84 04/18/2020   TRIG 59 04/18/2020   CHOLHDL 4.2 04/18/2020

## 2020-04-23 NOTE — Assessment & Plan Note (Signed)
Improving, managed by Psych 

## 2020-04-23 NOTE — Assessment & Plan Note (Signed)
Uncontrolled, increase dose of maxzide DASH diet and commitment to daily physical activity for a minimum of 30 minutes discussed and encouraged, as a part of hypertension management. The importance of attaining a healthy weight is also discussed.  BP/Weight 04/19/2020 03/23/2020 12/03/2019 10/23/2019 09/25/2019 7/68/0881 1/0/3159  Systolic BP 458 592 924 462 863 817 711  Diastolic BP 88 84 84 90 90 90 82  Wt. (Lbs) 202 211 211 218 217 217 215  BMI 33.61 35.11 35.11 36.28 36.11 36.11 35.78  Some encounter information is confidential and restricted. Go to Review Flowsheets activity to see all data.

## 2020-04-23 NOTE — Assessment & Plan Note (Signed)
  Patient re-educated about  the importance of commitment to a  minimum of 150 minutes of exercise per week as able.  The importance of healthy food choices with portion control discussed, as well as eating regularly and within a 12 hour window most days. The need to choose "clean , green" food 50 to 75% of the time is discussed, as well as to make water the primary drink and set a goal of 64 ounces water daily.    Weight /BMI 04/19/2020 03/23/2020 12/03/2019  WEIGHT 202 lb 211 lb 211 lb  HEIGHT 5\' 5"  5\' 5"  5\' 5"   BMI 33.61 kg/m2 35.11 kg/m2 35.11 kg/m2  Some encounter information is confidential and restricted. Go to Review Flowsheets activity to see all data.

## 2020-04-28 ENCOUNTER — Encounter: Payer: Self-pay | Admitting: Neurology

## 2020-04-28 ENCOUNTER — Institutional Professional Consult (permissible substitution): Payer: Self-pay | Admitting: Neurology

## 2020-04-29 NOTE — Progress Notes (Signed)
Virtual Visit via Video Note  I connected with Mary HASHIMI on 05/03/20 at  8:30 AM EDT by a video enabled telemedicine application and verified that I am speaking with the correct person using two identifiers.   I discussed the limitations of evaluation and management by telemedicine and the availability of in person appointments. The patient expressed understanding and agreed to proceed.      I discussed the assessment and treatment plan with the patient. The patient was provided an opportunity to ask questions and all were answered. The patient agreed with the plan and demonstrated an understanding of the instructions.   The patient was advised to call back or seek an in-person evaluation if the symptoms worsen or if the condition fails to improve as anticipated.  Location: patient- home, provider- home office   I provided 15 minutes of non-face-to-face time during this encounter.   [name], MD    [facility] MD/PA/NP OP Progress Note  05/03/2020 9:01 AM Mary Roman  MRN:  [id number]  Chief Complaint:  Chief Complaint    Depression; Follow-up     HPI:  - This is a follow up appointment for depression. When she is called for the appointment as she did not log into video on time, she states that she needs to get ready for work first. She checked in late for the appointment. After discussion, she verbalized her understanding that the appointment will be rescheduled if she is unable to attend on time while she is available.  She states that she is doing okay, although not the best.  She has been able to go to work regularly.  She reports fair relationship with her children.  She feels guilty that she is not able to be attending to the children as she wishes due to her mood.  She reports fair relationship with her fianc, stating that he does not drink anymore.  She has an anxiety as to how long it lasts.  She sleeps better.  She feels depressed.  She has difficulty in  concentration.  Her appetite fluctuates.  She denies SI.  She feels anxious and tense.  She has racing thoughts about work, home, and describe those as "jumbled." She has not been able to contact for therapy follow up, although she is willing to make the one.   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  one in college, one in eighth grade  Visit Diagnosis:    ICD-10-CM   1. MDD (major depressive disorder), recurrent episode, moderate ([place])  F33.1     Past Psychiatric History: Please see initial evaluation for full details. I have reviewed the history. No updates at this time.     Past Medical History:  Past Medical History:  Diagnosis Date  . Absolute anemia 06/23/2018   Added automatically from request for surgery [id number]  . Anemia   . Anxiety   . Backache 12/24/2007  . Chronic back pain   . Diabetes mellitus, type II ([place])   . Headache(784.0)   . Hypertension   . Metabolic syndrome X 2/35/3614  . Obesity   . Palpitation   . Sickle cell trait ([place])   . Thyromegaly   . Unspecified vitamin D deficiency 02/19/2013    Past Surgical History:  Procedure Laterality Date  . DILATATION AND CURETTAGE/HYSTEROSCOPY WITH MINERVA N/A 03/25/2019   Procedure: DILATATION AND CURETTAGE/HYSTEROSCOPY WITH  ATTEMPTED MINERVA ENDOMETRIAL ABLATION;  Surgeon: Florian Buff, MD;  Location: [facility];  Service: Gynecology;  Laterality: N/A;  . None      Family Psychiatric History: Please see initial evaluation for full details. I have reviewed the history. No updates at this time.     Family History:  Family History  Problem Relation Age of Onset  . Diabetes Mother   . Hypertension Mother   . Stroke Mother   . Colon cancer Mother   . Alcohol abuse Mother   . Cancer - Colon Mother 71  . Pneumonia Father   . Hypertension Sister   . Anxiety disorder Sister   . Hypertension  Sister   . Leukemia Sister   . Diabetes Maternal Grandmother   . Hypertension Maternal Grandmother   . Hypertension Maternal Grandfather   . Heart attack Maternal Uncle     Social History:  Social History   Socioeconomic History  . Marital status: Single    Spouse name: Not on file  . Number of children: 2  . Years of education: Not on file  . Highest education level: Not on file  Occupational History  . Occupation: employed in medical office   Tobacco Use  . Smoking status: Never Smoker  . Smokeless tobacco: Never Used  Vaping Use  . Vaping Use: Never used  Substance and Sexual Activity  . Alcohol use: No    Comment: 01-17-2017 per pt no  . Drug use: No    Comment: 01-17-2017 per pt no  . Sexual activity: Yes    Partners: Male    Birth control/protection: Other-see comments    Comment: bf had vasectomy  Other Topics Concern  . Not on file  Social History Narrative  . Not on file   Social Determinants of Health   Financial Resource Strain:   . Difficulty of Paying Living Expenses: Not on file  Food Insecurity:   . Worried About Charity fundraiser in the Last Year: Not on file  . Ran Out of Food in the Last Year: Not on file  Transportation Needs:   . Lack of Transportation (Medical): Not on file  . Lack of Transportation (Non-Medical): Not on file  Physical Activity:   . Days of Exercise per Week: Not on file  . Minutes of Exercise per Session: Not on file  Stress:   . Feeling of Stress : Not on file  Social Connections:   . Frequency of Communication with Friends and Family: Not on file  . Frequency of Social Gatherings with Friends and Family: Not on file  . Attends Religious Services: Not on file  . Active Member of Clubs or Organizations: Not on file  . Attends Archivist Meetings: Not on file  . Marital Status: Not on file    Allergies: No Known Allergies  Metabolic Disorder Labs: Lab Results  Component Value Date   HGBA1C 8.6 (H)  04/18/2020   MPG 200.12 04/18/2020   MPG 206 01/13/2020   No results found for: PROLACTIN Lab Results  Component Value Date   CHOL 126 04/18/2020   TRIG 59 04/18/2020   HDL 30 (L) 04/18/2020   CHOLHDL 4.2 04/18/2020   VLDL 12 04/18/2020   LDLCALC 84 04/18/2020   LDLCALC 65 01/13/2020   Lab Results  Component Value Date   TSH 1.916 05/13/2019   TSH 1.23 03/06/2018    Therapeutic Level Labs: No results found for: LITHIUM No results found for: VALPROATE No components found for:  CBMZ  Current Medications: Current Outpatient Medications  Medication Sig Dispense Refill  . ARIPiprazole (ABILIFY) 15 MG tablet Take 1 tablet (15 mg total) by mouth daily. 30 tablet 0  . aspirin EC 81 MG tablet Take 1 tablet (81 mg total) by mouth daily. 90 tablet 3  . blood glucose meter kit and supplies Dispense based on patient and insurance preference. Once daily testing dx e11.9 1 each 0  . buPROPion (WELLBUTRIN XL) 300 MG 24 hr tablet Take 1 tablet (300 mg total) by mouth daily. 90 tablet 0  . glipiZIDE (GLUCOTROL XL) 10 MG 24 hr tablet Take two tablets by mouth once daily with breakfast 180 tablet 1  . HYDROcodone-acetaminophen (NORCO/VICODIN) 5-325 MG tablet Take one tablet by mouth once daily for back pain 30 tablet 0  . [START ON 05/23/2020] HYDROcodone-acetaminophen (NORCO/VICODIN) 5-325 MG tablet Take one tablet once daily by mouth for back pain 30 tablet 0  . [START ON 06/23/2020] HYDROcodone-acetaminophen (NORCO/VICODIN) 5-325 MG tablet Take one tablet by mouth once daily for back pain 30 tablet 0  . megestrol (MEGACE) 40 MG tablet TAKE 3 TABLETS BY MOUTH ONCE DAILY FOR 5 DAYS, THEN TAKE 2 TABLETS ONCE DAILY FOR 5 DAYS, THEN TAKE 1 TABLET ONCE DAILY. 30 tablet 5  . metoprolol tartrate (LOPRESSOR) 25 MG tablet TAKE 1 TABLET BY MOUTH DAILY. 90 tablet 0  . potassium chloride SA (KLOR-CON) 20 MEQ tablet Take 1 tablet (20 mEq total) by mouth 2 (two) times daily. 60 tablet 3  . rosuvastatin  (CRESTOR) 5 MG tablet Take 1 tablet (5 mg total) by mouth daily. 90 tablet 3  . SitaGLIPtin-MetFORMIN HCl [phone] MG TB24 Take one tablet by mouth once every day 30 tablet 5  . temazepam (RESTORIL) 30 MG capsule Take 1 capsule (30 mg total) by mouth at bedtime as needed for sleep. 30 capsule 3  . triamterene-hydrochlorothiazide (MAXZIDE-25) 37.5-25 MG tablet Take one and a half tablets by mouth every day 135 tablet 3  . venlafaxine XR (EFFEXOR-XR) 150 MG 24 hr capsule 225 mg daily (150 mg + 75 mg) 90 capsule 0  . venlafaxine XR (EFFEXOR-XR) 75 MG 24 hr capsule 225 mg daily (150 mg + 75 mg) 90 capsule 0   No current facility-administered medications for this visit.     Musculoskeletal: Strength & Muscle Tone: N?A Gait & Station: N/A Patient leans: N/A  Psychiatric Specialty Exam: Review of Systems  Psychiatric/Behavioral: Positive for decreased concentration, dysphoric mood and sleep disturbance. Negative for agitation, behavioral problems, confusion, hallucinations, self-injury and suicidal ideas. The patient is nervous/anxious. The patient is not hyperactive.   All other systems reviewed and are negative.   There were no vitals taken for this visit.There is no height or weight on file to calculate BMI.  General Appearance: Fairly Groomed  Eye Contact:  Good  Speech:  Clear and Coherent  Volume:  Normal  Mood:  Depressed  Affect:  Appropriate, Congruent and Restricted  Thought Process:  Coherent  Orientation:  Full (Time, Place, and Person)  Thought Content: Logical   Suicidal Thoughts:  No  Homicidal Thoughts:  No  Memory:  Immediate;   Good  Judgement:  Good  Insight:  Fair  Psychomotor Activity:  Normal  Concentration:  Concentration: Good and Attention Span: Good  Recall:  Good  Fund of Knowledge: Good  Language: Good  Akathisia:  No  Handed:  Right  AIMS (if indicated): not done  Assets:  Communication Skills Desire for Improvement  ADL's:  Intact  Cognition: WNL   Sleep:  Fair   Screenings: GAD-7     Counselor from 05/15/2018 in [place] [facility] Office Visit from 01/08/2017 in [facility]  Total GAD-7 Score 17 14    PHQ2-9     Video Visit from 03/23/2020 in [facility] Office Visit from 12/03/2019 in [facility] Office Visit from 08/04/2019 in [facility] Office Visit from 05/13/2019 in [facility] Office Visit from 02/03/2019 in [facility]  PHQ-2 Total Score 2 0 2 4 1   PHQ-9 Total Score 6 2 13 13  --       Assessment and Plan:  JORDI KAMM is a 49 y.o. year old female with a history of depression, diabetes , who presents for follow up appointment for below.   1. MDD (major depressive disorder), recurrent episode, moderate ([place]) She continues to report depressive symptoms and anxiety since the last visit.  Psychosocial stressors includes loss of her brother and niece.  Will uptitrate bupropion as adjunctive treatment for depression.  She is willing to try doing this despite she may have some side effect in the past.  She has no known history of seizure.  We will continue venlafaxine to target depression and anxiety.  We will continue Abilify as adjunctive treatment for depression.  Discussed potential metabolic side effect and EPS.  She will greatly benefit from CBT; she is advised to contact the office for follow-up.   2. Insomnia, unspecified type She continues to have prominent fatigue, middle insomnia and snoring.  She is scheduled for sleep study.   Plan I have reviewed and updated plans as below 1.Continuevenlafaxine 225 mg daily (150 mg + 75 mg) - refill left according to the [facility] 2. Increase bupropion 450  mg daily  3.Continue Abilify 34m daily 4. Referred to sleep study  5.Next appointment: 11/23 at 8:30 for 20 mins, video -on temazepam 30 mg at night as needed for sleep -on gabapentin for pain and  hydroxyzine  Contacted the pharmacy. According to the pharmacy, the order of venlafaxine 150 mg was filled in September, which was the order from 2020. The 75 mg order was filled in July, which was the order from 2020. There are no refills left at [facility]   Past trials of medication: fluoxetine,duloxetine,buspirone, Trazodone ("weird" feeling) temazepam, Xanax  The patient demonstrates the following risk factors for suicide: Chronic risk factors for suicide include: psychiatric disorder of depression, anxietyand chronic pain. Acute risk factorsfor suicide include: N/A. Protective factorsfor this patient include: positive social support, responsibility to others (children, family), coping skills and hope for the future. Considering these factors, the overall suicide risk at this point appears to be low. Patient isappropriate for outpatient follow up.   RNorman Clay MD 05/03/2020, 9:01 AM

## 2020-05-03 ENCOUNTER — Other Ambulatory Visit: Payer: Self-pay

## 2020-05-03 ENCOUNTER — Encounter (HOSPITAL_COMMUNITY): Payer: Self-pay | Admitting: Psychiatry

## 2020-05-03 ENCOUNTER — Telehealth (INDEPENDENT_AMBULATORY_CARE_PROVIDER_SITE_OTHER): Payer: No Typology Code available for payment source | Admitting: Psychiatry

## 2020-05-03 DIAGNOSIS — F331 Major depressive disorder, recurrent, moderate: Secondary | ICD-10-CM | POA: Diagnosis not present

## 2020-05-03 DIAGNOSIS — F3342 Major depressive disorder, recurrent, in full remission: Secondary | ICD-10-CM

## 2020-05-03 MED ORDER — BUPROPION HCL ER (XL) 150 MG PO TB24: ORAL_TABLET | ORAL | 1 refills | Status: DC

## 2020-05-03 MED ORDER — ARIPIPRAZOLE 15 MG PO TABS: 15.0000 mg | ORAL_TABLET | Freq: Every day | ORAL | 1 refills | Status: DC

## 2020-05-03 NOTE — Patient Instructions (Signed)
1.Continuevenlafaxine 225 mg daily (150 mg + 75 mg) - refill left 2. Increase bupropion 450  mg daily  3.Continue Abilify 15mg  daily 4. Referredl to sleep study  5.Next appointment: 11/23 at 8:30

## 2020-06-02 ENCOUNTER — Institutional Professional Consult (permissible substitution): Payer: Self-pay | Admitting: Neurology

## 2020-06-07 ENCOUNTER — Other Ambulatory Visit: Payer: Self-pay | Admitting: Family Medicine

## 2020-06-08 ENCOUNTER — Telehealth: Payer: Self-pay

## 2020-06-08 ENCOUNTER — Other Ambulatory Visit: Payer: Self-pay | Admitting: Family Medicine

## 2020-06-08 MED ORDER — TEMAZEPAM 30 MG PO CAPS
30.0000 mg | ORAL_CAPSULE | Freq: Every evening | ORAL | 5 refills | Status: DC | PRN
Start: 2020-06-08 — End: 2020-09-22

## 2020-06-08 NOTE — Telephone Encounter (Signed)
Asking for refill on temazepam to be sent to [facility] in [place]

## 2020-06-08 NOTE — Telephone Encounter (Signed)
Refilled pls let her know

## 2020-06-14 NOTE — Progress Notes (Addendum)
Virtual Visit via Telephone Note  I connected with Mary Roman on 06/21/20 at  8:20 AM EST by telephone and verified that I am speaking with the correct person using two identifiers.  Location: Patient: home Provider: office Persons participated in the visit- patient, provider   I discussed the limitations, risks, security and privacy concerns of performing an evaluation and management service by telephone and the availability of in person appointments. I also discussed with the patient that there may be a patient responsible charge related to this service. The patient expressed understanding and agreed to proceed.   I discussed the assessment and treatment plan with the patient. The patient was provided an opportunity to ask questions and all were answered. The patient agreed with the plan and demonstrated an understanding of the instructions.   The patient was advised to call back or seek an in-person evaluation if the symptoms worsen or if the condition fails to improve as anticipated.  I provided 12 minutes of non-face-to-face time during this encounter.   [name], MD    [facility] MD/PA/NP OP Progress Note  06/21/2020 8:41 AM Mary Roman  MRN:  [id number]  Chief Complaint:  Chief Complaint    Depression; Follow-up     HPI:  This is a follow-up appointment for depression.  She states that she has been feeling down around the holidays.  Although she does not know why, she tends to feel anxious as she needs to get things ready for holidays.  She notices worsening in difficulty in concentration at work.  Her boss commented on it a few months ago.  She reports better relationship with her children.  However,  she tends to stay by herself. Although she used to enjoy shopping with her daughter, she does not do it anymore due to anhedonia.  She is willing to try it again and doing craft, which she used to enjoy.  She has better sleep.  She has an upcoming appointment with  neurologist for sleep evaluation.  She feels fatigue.  She has decreased appetite.  She denies SI.  She denies panic attacks.  While reviewing her medication, she has been taking venlafaxine 150 mg instead of 225 mg.  She has not been able to fill bupropion, and was taking 300 mg instead of 450 mg.  She was advised to contact office if any issues with her medication.   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  one daughter in [facility], one in eighth grade   Visit Diagnosis:    ICD-10-CM   1. MDD (major depressive disorder), recurrent episode, moderate (HCC)  F33.1 ARIPiprazole (ABILIFY) 15 MG tablet    buPROPion (WELLBUTRIN XL) 300 MG 24 hr tablet    Past Psychiatric History: Please see initial evaluation for full details. I have reviewed the history. No updates at this time.     Past Medical History:  Past Medical History:  Diagnosis Date  . Absolute anemia 06/23/2018   Added automatically from request for surgery [id number]  . Anemia   . Anxiety   . Backache 12/24/2007  . Chronic back pain   . Diabetes mellitus, type II ([place])   . Headache(784.0)   . Hypertension   . Metabolic syndrome X 7/84/6962  . Obesity   . Palpitation   . Sickle cell trait ([place])   . Thyromegaly   . Unspecified vitamin D deficiency 02/19/2013    Past Surgical History:  Procedure Laterality  Date  . DILATATION AND CURETTAGE/HYSTEROSCOPY WITH MINERVA N/A 03/25/2019   Procedure: DILATATION AND CURETTAGE/HYSTEROSCOPY WITH  ATTEMPTED MINERVA ENDOMETRIAL ABLATION;  Surgeon: Florian Buff, MD;  Location: [facility];  Service: Gynecology;  Laterality: N/A;  . None      Family Psychiatric History: Please see initial evaluation for full details. I have reviewed the history. No updates at this time.     Family History:  Family History  Problem Relation Age of Onset  . Diabetes Mother   .  Hypertension Mother   . Stroke Mother   . Colon cancer Mother   . Alcohol abuse Mother   . Cancer - Colon Mother 49  . Pneumonia Father   . Hypertension Sister   . Anxiety disorder Sister   . Hypertension Sister   . Leukemia Sister   . Diabetes Maternal Grandmother   . Hypertension Maternal Grandmother   . Hypertension Maternal Grandfather   . Heart attack Maternal Uncle     Social History:  Social History   Socioeconomic History  . Marital status: Single    Spouse name: Not on file  . Number of children: 2  . Years of education: Not on file  . Highest education level: Not on file  Occupational History  . Occupation: employed in medical office   Tobacco Use  . Smoking status: Never Smoker  . Smokeless tobacco: Never Used  Vaping Use  . Vaping Use: Never used  Substance and Sexual Activity  . Alcohol use: No    Comment: 01-17-2017 per pt no  . Drug use: No    Comment: 01-17-2017 per pt no  . Sexual activity: Yes    Partners: Male    Birth control/protection: Other-see comments    Comment: bf had vasectomy  Other Topics Concern  . Not on file  Social History Narrative  . Not on file   Social Determinants of Health   Financial Resource Strain:   . Difficulty of Paying Living Expenses: Not on file  Food Insecurity:   . Worried About Charity fundraiser in the Last Year: Not on file  . Ran Out of Food in the Last Year: Not on file  Transportation Needs:   . Lack of Transportation (Medical): Not on file  . Lack of Transportation (Non-Medical): Not on file  Physical Activity:   . Days of Exercise per Week: Not on file  . Minutes of Exercise per Session: Not on file  Stress:   . Feeling of Stress : Not on file  Social Connections:   . Frequency of Communication with Friends and Family: Not on file  . Frequency of Social Gatherings with Friends and Family: Not on file  . Attends Religious Services: Not on file  . Active Member of Clubs or Organizations: Not on  file  . Attends Archivist Meetings: Not on file  . Marital Status: Not on file    Allergies: No Known Allergies  Metabolic Disorder Labs: Lab Results  Component Value Date   HGBA1C 8.6 (H) 04/18/2020   MPG 200.12 04/18/2020   MPG 206 01/13/2020   No results found for: PROLACTIN Lab Results  Component Value Date   CHOL 126 04/18/2020   TRIG 59 04/18/2020   HDL 30 (L) 04/18/2020   CHOLHDL 4.2 04/18/2020   VLDL 12 04/18/2020   LDLCALC 84 04/18/2020   LDLCALC 65 01/13/2020   Lab Results  Component Value Date   TSH 1.916 05/13/2019   TSH 1.23 03/06/2018  Therapeutic Level Labs: No results found for: LITHIUM No results found for: VALPROATE No components found for:  CBMZ  Current Medications: Current Outpatient Medications  Medication Sig Dispense Refill  . ARIPiprazole (ABILIFY) 15 MG tablet Take 1 tablet (15 mg total) by mouth daily. 30 tablet 1  . aspirin EC 81 MG tablet Take 1 tablet (81 mg total) by mouth daily. 90 tablet 3  . blood glucose meter kit and supplies Dispense based on patient and insurance preference. Once daily testing dx e11.9 1 each 0  . buPROPion (WELLBUTRIN XL) 150 MG 24 hr tablet Total of 450 mg daily. Take along with 300 mg tab 30 tablet 1  . buPROPion (WELLBUTRIN XL) 300 MG 24 hr tablet Take 1 tablet (300 mg total) by mouth daily. 90 tablet 0  . glipiZIDE (GLUCOTROL XL) 10 MG 24 hr tablet Take two tablets by mouth once daily with breakfast 180 tablet 1  . HYDROcodone-acetaminophen (NORCO/VICODIN) 5-325 MG tablet Take one tablet once daily by mouth for back pain 30 tablet 0  . [START ON 06/23/2020] HYDROcodone-acetaminophen (NORCO/VICODIN) 5-325 MG tablet Take one tablet by mouth once daily for back pain 30 tablet 0  . megestrol (MEGACE) 40 MG tablet TAKE 3 TABLETS BY MOUTH ONCE DAILY FOR 5 DAYS, THEN TAKE 2 TABLETS ONCE DAILY FOR 5 DAYS, THEN TAKE 1 TABLET ONCE DAILY. 45 tablet 5  . metoprolol tartrate (LOPRESSOR) 25 MG tablet TAKE 1  TABLET BY MOUTH DAILY. 90 tablet 0  . potassium chloride SA (KLOR-CON) 20 MEQ tablet Take 1 tablet (20 mEq total) by mouth 2 (two) times daily. 60 tablet 3  . rosuvastatin (CRESTOR) 5 MG tablet Take 1 tablet (5 mg total) by mouth daily. 90 tablet 3  . SitaGLIPtin-MetFORMIN HCl [phone] MG TB24 Take one tablet by mouth once every day 30 tablet 5  . temazepam (RESTORIL) 30 MG capsule Take 1 capsule (30 mg total) by mouth at bedtime as needed for sleep. 30 capsule 3  . temazepam (RESTORIL) 30 MG capsule Take 1 capsule (30 mg total) by mouth at bedtime as needed for sleep. 30 capsule 5  . triamterene-hydrochlorothiazide (MAXZIDE-25) 37.5-25 MG tablet Take one and a half tablets by mouth every day 135 tablet 3  . venlafaxine XR (EFFEXOR-XR) 150 MG 24 hr capsule 225 mg daily (150 mg + 75 mg) 90 capsule 0  . venlafaxine XR (EFFEXOR-XR) 75 MG 24 hr capsule 225 mg daily (150 mg + 75 mg) 90 capsule 0   No current facility-administered medications for this visit.     Musculoskeletal: Strength & Muscle Tone: N/A Gait & Station: N/A Patient leans: N/A  Psychiatric Specialty Exam: Review of Systems  Psychiatric/Behavioral: Positive for decreased concentration and dysphoric mood. Negative for agitation, behavioral problems, confusion, hallucinations, self-injury, sleep disturbance and suicidal ideas. The patient is nervous/anxious. The patient is not hyperactive.   All other systems reviewed and are negative.   There were no vitals taken for this visit.There is no height or weight on file to calculate BMI.  General Appearance: NA  Eye Contact:  NA  Speech:  Clear and Coherent  Volume:  Normal  Mood:  Depressed  Affect:  NA  Thought Process:  Coherent  Orientation:  Full (Time, Place, and Person)  Thought Content: Logical   Suicidal Thoughts:  No  Homicidal Thoughts:  No  Memory:  Immediate;   Good  Judgement:  Good  Insight:  Fair  Psychomotor Activity:  Normal  Concentration:   Concentration: Good  and Attention Span: Good  Recall:  Good  Fund of Knowledge: Good  Language: Good  Akathisia:  No  Handed:  Right  AIMS (if indicated): not done  Assets:  Communication Skills Desire for Improvement  ADL's:  Intact  Cognition: WNL  Sleep:  Fair   Screenings: GAD-7     Counselor from 05/15/2018 in [facility] Visit from 01/08/2017 in [facility]  Total GAD-7 Score 17 14    PHQ2-9     Video Visit from 03/23/2020 in [facility] Office Visit from 12/03/2019 in [facility] Office Visit from 08/04/2019 in [facility] Office Visit from 05/13/2019 in [facility] Office Visit from 02/03/2019 in [facility]  PHQ-2 Total Score 2 0 _0 PHQ-9 Total Score _1 --       Assessment and Plan:  Mary Roman is a 49 y.o. year old female with a history of  depression, diabetes, who presents for follow up appointment for below.   1. MDD (major depressive disorder), recurrent episode, moderate ([place]) She continues to report depressive symptoms and anxiety in the context of upcoming holidays.  It was also found out that the patient was not taking medication as directed by accident.  Will uptitrate venlafaxine to optimize treatment for depression and anxiety.  Will continue current dose of bupropion at this time to target depression.  She has no known history of seizure.  Will continue Abilify adjunctive treatment for depression.  Discussed potential metabolic side effect and EPS.   2. Insomnia, unspecified type She continues to have prominent fatigue, middle insomnia and snoring.  She is scheduled for sleep study.   Plan I have reviewed and updated plans as below 1.Increasevenlafaxine 225 mg daily (150 mg + 75 mg)- refill left according to [facility] 2. Continue bupropion 300  mg daily  3.Continue Abilify 28m daily 4. Next appointment: 1/4  at 8:20 for 20 mins, video -ontemazepam 30 mg at night as needed for sleep -on gabapentin for pain and hydroxyzine  Past trials of medication: fluoxetine,duloxetine,buspirone, Trazodone ("weird" feeling) temazepam, Xanax  The patient demonstrates the following risk factors for suicide: Chronic risk factors for suicide include: psychiatric disorder of depression, anxietyand chronic pain. Acute risk factorsfor suicide include: N/A. Protective factorsfor this patient include: positive social support, responsibility to others (children, family), coping skills and hope for the future. Considering these factors, the overall suicide risk at this point appears to be low. Patient isappropriate for outpatient follow up.  RNorman Clay MD 06/21/2020, 8:41 AM

## 2020-06-15 ENCOUNTER — Telehealth: Payer: Self-pay | Admitting: *Deleted

## 2020-06-15 ENCOUNTER — Other Ambulatory Visit: Payer: Self-pay | Admitting: Obstetrics & Gynecology

## 2020-06-15 MED ORDER — MEGESTROL ACETATE 40 MG PO TABS: ORAL_TABLET | ORAL | 5 refills | Status: DC

## 2020-06-15 NOTE — Telephone Encounter (Signed)
Stop the megestrol for 1 week then restart at 3 a day for 5 days 2 a day for 5 days then 1 a day  I will send in another script

## 2020-06-15 NOTE — Telephone Encounter (Signed)
Patient called stating she is bleeding again. Patient wants to know if her medication needs to be adjusted that Dr Elonda Husky put her on.     Please call either work/ cell  [phone]  [phone]

## 2020-06-15 NOTE — Telephone Encounter (Signed)
Pt informed of Dr. Chipper Oman recommendation. No other questions at this time.

## 2020-06-15 NOTE — Telephone Encounter (Signed)
Pt currently on Megace 40 mg daily. She is bleeding and wonders if Dr. Elonda Husky can adjust the dose to help the bleeding stop. Advised he is not in office today but would be back tomorrow. I will call patient back when he responds. No other questions at this time.

## 2020-06-17 ENCOUNTER — Other Ambulatory Visit (HOSPITAL_COMMUNITY): Payer: Self-pay | Admitting: Family Medicine

## 2020-06-17 DIAGNOSIS — Z1231 Encounter for screening mammogram for malignant neoplasm of breast: Secondary | ICD-10-CM

## 2020-06-21 ENCOUNTER — Telehealth (INDEPENDENT_AMBULATORY_CARE_PROVIDER_SITE_OTHER): Payer: No Typology Code available for payment source | Admitting: Psychiatry

## 2020-06-21 ENCOUNTER — Other Ambulatory Visit: Payer: Self-pay

## 2020-06-21 ENCOUNTER — Encounter: Payer: Self-pay | Admitting: Psychiatry

## 2020-06-21 ENCOUNTER — Telehealth (HOSPITAL_COMMUNITY): Payer: No Typology Code available for payment source | Admitting: Psychiatry

## 2020-06-21 DIAGNOSIS — F331 Major depressive disorder, recurrent, moderate: Secondary | ICD-10-CM | POA: Diagnosis not present

## 2020-06-21 MED ORDER — ARIPIPRAZOLE 15 MG PO TABS: 15.0000 mg | ORAL_TABLET | Freq: Every day | ORAL | 1 refills | Status: DC

## 2020-06-21 MED ORDER — BUPROPION HCL ER (XL) 300 MG PO TB24: 300.0000 mg | ORAL_TABLET | Freq: Every day | ORAL | 0 refills | Status: DC

## 2020-06-21 NOTE — Patient Instructions (Signed)
1.Increasevenlafaxine 225 mg daily (150 mg + 75 mg) 2. Continue bupropion 300  mg daily  3.Continue Abilify 15mg  daily 4. Next appointment: 1/4 at 8:20

## 2020-06-28 ENCOUNTER — Other Ambulatory Visit: Payer: Self-pay

## 2020-06-28 ENCOUNTER — Ambulatory Visit: Payer: No Typology Code available for payment source | Admitting: Neurology

## 2020-06-28 ENCOUNTER — Encounter: Payer: Self-pay | Admitting: Neurology

## 2020-06-28 VITALS — BP 153/93 | HR 70 | Ht 65.0 in | Wt 206.0 lb

## 2020-06-28 DIAGNOSIS — G4700 Insomnia, unspecified: Secondary | ICD-10-CM | POA: Diagnosis not present

## 2020-06-28 DIAGNOSIS — Z9189 Other specified personal risk factors, not elsewhere classified: Secondary | ICD-10-CM

## 2020-06-28 DIAGNOSIS — Z79891 Long term (current) use of opiate analgesic: Secondary | ICD-10-CM

## 2020-06-28 DIAGNOSIS — R0683 Snoring: Secondary | ICD-10-CM

## 2020-06-28 DIAGNOSIS — G478 Other sleep disorders: Secondary | ICD-10-CM | POA: Diagnosis not present

## 2020-06-28 DIAGNOSIS — E669 Obesity, unspecified: Secondary | ICD-10-CM

## 2020-06-28 NOTE — Patient Instructions (Signed)

## 2020-06-28 NOTE — Progress Notes (Signed)
Subjective:    Patient ID: Mary Roman is a 49 y.o. female.  HPI     Star Age, MD, PhD [facility] [address]O. [place], [place] [address]  Dear Dr. Modesta Messing,   I saw your patient, [name], upon your kind request, in my Sleep clinic today for initial consultation of her sleep disorder, in particular, concern for underlying obstructive sleep apnea.  The patient is unaccompanied today.  As you know, [name] is a 49 year old right-handed woman with an underlying medical history of sickle cell trait, thyromegaly, vitamin D deficiency, palpitations, hypertension, chronic back pain, anxiety, depression, diabetes, anemia and obesity, who reports snoring and nonrestorative sleep, chronic difficulty initiating and particularly maintaining sleep for years.  She has been on temazepam for several years.  She has been on hydrocodone for low back pain, takes it sparingly, 2-3 times a week, typically only 1 pill.  She does not wake up rested.  She tries to adhere to his sleep schedule.  Her Epworth sleepiness score is 7/24, fatigue severity score is 34 out of 63.  She lives with her fianc and her 2 children, ages 35 and 50.  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  Bedtime is generally between 10 and 11 and rise time around 7.  She does not have night to night nocturia or recurrent morning headaches, denies a family history of sleep apnea.  She is on several medications including Wellbutrin, Abilify, and Effexor for her mood and reports that he also sees a Social worker.  He drinks caffeine in the form of soda or tea, about 3 servings per day, occasional coffee.  She does not smoke or drink alcohol.  She has no pets in the house.  They do have a TV in the bedroom and it is on at night but turns off on a timer.  Her Past Medical History Is Significant For: Past Medical History:  Diagnosis Date  . Absolute anemia  06/23/2018   Added automatically from request for surgery [id number]  . Anemia   . Anxiety   . Backache 12/24/2007  . Chronic back pain   . Diabetes mellitus, type II ([place])   . Headache(784.0)   . Hypertension   . Metabolic syndrome X 10/23/7122  . Obesity   . Palpitation   . Sickle cell trait ([place])   . Thyromegaly   . Unspecified vitamin D deficiency 02/19/2013    Her Past Surgical History Is Significant For: Past Surgical History:  Procedure Laterality Date  . DILATATION AND CURETTAGE/HYSTEROSCOPY WITH MINERVA N/A 03/25/2019   Procedure: DILATATION AND CURETTAGE/HYSTEROSCOPY WITH  ATTEMPTED MINERVA ENDOMETRIAL ABLATION;  Surgeon: Florian Buff, MD;  Location: [facility];  Service: Gynecology;  Laterality: N/A;  . None      Her Family History Is Significant For: Family History  Problem Relation Age of Onset  . Diabetes Mother   . Hypertension Mother   . Stroke Mother   . Colon cancer Mother   . Alcohol abuse Mother   . Cancer - Colon Mother 69  . Pneumonia Father   . Hypertension Sister   . Anxiety disorder Sister   . Hypertension Sister   . Leukemia Sister   . Diabetes Maternal Grandmother   . Hypertension Maternal Grandmother   . Hypertension Maternal Grandfather   . Heart attack Maternal Uncle     Her Social History Is Significant For: Social History   Socioeconomic History  .  Marital status: Single    Spouse name: Not on file  . Number of children: 2  . Years of education: Not on file  . Highest education level: Not on file  Occupational History  . Occupation: employed in medical office   Tobacco Use  . Smoking status: Never Smoker  . Smokeless tobacco: Never Used  Vaping Use  . Vaping Use: Never used  Substance and Sexual Activity  . Alcohol use: No    Comment: 01-17-2017 per pt no  . Drug use: No    Comment: 01-17-2017 per pt no  . Sexual activity: Yes    Partners: Male    Birth control/protection: Other-see comments    Comment: bf had vasectomy  Other  Topics Concern  . Not on file  Social History Narrative  . Not on file   Social Determinants of Health   Financial Resource Strain:   . Difficulty of Paying Living Expenses: Not on file  Food Insecurity:   . Worried About Charity fundraiser in the Last Year: Not on file  . Ran Out of Food in the Last Year: Not on file  Transportation Needs:   . Lack of Transportation (Medical): Not on file  . Lack of Transportation (Non-Medical): Not on file  Physical Activity:   . Days of Exercise per Week: Not on file  . Minutes of Exercise per Session: Not on file  Stress:   . Feeling of Stress : Not on file  Social Connections:   . Frequency of Communication with Friends and Family: Not on file  . Frequency of Social Gatherings with Friends and Family: Not on file  . Attends Religious Services: Not on file  . Active Member of Clubs or Organizations: Not on file  . Attends Archivist Meetings: Not on file  . Marital Status: Not on file    Her Allergies Are:  No Known Allergies:   Her Current Medications Are:  Outpatient Encounter Medications as of 06/28/2020  Medication Sig  . ARIPiprazole (ABILIFY) 15 MG tablet Take 1 tablet (15 mg total) by mouth daily.  Marland Kitchen aspirin EC 81 MG tablet Take 1 tablet (81 mg total) by mouth daily.  . blood glucose meter kit and supplies Dispense based on patient and insurance preference. Once daily testing dx e11.9  . buPROPion (WELLBUTRIN XL) 150 MG 24 hr tablet Total of 450 mg daily. Take along with 300 mg tab  . buPROPion (WELLBUTRIN XL) 300 MG 24 hr tablet Take 1 tablet (300 mg total) by mouth daily.  Marland Kitchen glipiZIDE (GLUCOTROL XL) 10 MG 24 hr tablet Take two tablets by mouth once daily with breakfast  . HYDROcodone-acetaminophen (NORCO/VICODIN) 5-325 MG tablet Take one tablet by mouth once daily for back pain  . megestrol (MEGACE) 40 MG tablet TAKE 3 TABLETS BY MOUTH ONCE DAILY FOR 5 DAYS, THEN TAKE 2 TABLETS ONCE DAILY FOR 5 DAYS, THEN TAKE 1  TABLET ONCE DAILY.  . metoprolol tartrate (LOPRESSOR) 25 MG tablet TAKE 1 TABLET BY MOUTH DAILY.  Marland Kitchen potassium chloride SA (KLOR-CON) 20 MEQ tablet Take 1 tablet (20 mEq total) by mouth 2 (two) times daily.  . rosuvastatin (CRESTOR) 5 MG tablet Take 1 tablet (5 mg total) by mouth daily.  . SitaGLIPtin-MetFORMIN HCl [phone] MG TB24 Take one tablet by mouth once every day  . temazepam (RESTORIL) 30 MG capsule Take 1 capsule (30 mg total) by mouth at bedtime as needed for sleep.  . temazepam (RESTORIL) 30 MG capsule  Take 1 capsule (30 mg total) by mouth at bedtime as needed for sleep.  Marland Kitchen triamterene-hydrochlorothiazide (MAXZIDE-25) 37.5-25 MG tablet Take one and a half tablets by mouth every day  . venlafaxine XR (EFFEXOR-XR) 150 MG 24 hr capsule 225 mg daily (150 mg + 75 mg)  . venlafaxine XR (EFFEXOR-XR) 75 MG 24 hr capsule 225 mg daily (150 mg + 75 mg)  . [DISCONTINUED] ferrous sulfate 325 (65 FE) MG EC tablet Take 325 mg by mouth 3 (three) times daily with meals.   No facility-administered encounter medications on file as of 06/28/2020.  :  Review of Systems:  Out of a complete 14 point review of systems, all are reviewed and negative with the exception of these symptoms as listed below: Review of Systems  Neurological:       Pt presents today to discuss her sleep. Pt has never had a sleep study but does endorse snoring.  Epworth Sleepiness Scale 0= would never doze 1= slight chance of dozing 2= moderate chance of dozing 3= high chance of dozing  Sitting and reading: 1 Watching TV: 2 Sitting inactive in a public place (ex. Theater or meeting): 0 As a passenger in a car for an hour without a break: 0 Lying down to rest in the afternoon: 3 Sitting and talking to someone: 0 Sitting quietly after lunch (no alcohol): 1 In a car, while stopped in traffic: 0 Total: 7     Objective:  Neurological Exam  Physical Exam Physical Examination:   Vitals:   06/28/20 1105  BP: (!)  153/93  Pulse: 70    General Examination: The patient is a very pleasant 49 y.o. female in no acute distress. She appears well-developed and well-nourished and well groomed.   HEENT: Normocephalic, atraumatic, pupils are equal, round and reactive to light, extraocular tracking is good without limitation to gaze excursion or nystagmus noted. Hearing is grossly intact. Face is symmetric with normal facial animation. Speech is clear with no dysarthria noted. There is no hypophonia. There is no lip, neck/head, jaw or voice tremor. Neck is supple with full range of passive and active motion. There are no carotid bruits on auscultation. Oropharynx exam reveals: mild mouth dryness, adequate dental hygiene and moderate airway crowding, due to tonsillar size of 3+, elongated uvula noted, Mallampati class II, neck circumference of 16-1/2 inches.  She has a moderate underbite.  Tongue protrudes centrally and palate elevates symmetrically.  Chest: Clear to auscultation without wheezing, rhonchi or crackles noted.  Heart: S1+S2+0, regular and normal without murmurs, rubs or gallops noted.   Abdomen: Soft, non-tender and non-distended with normal bowel sounds appreciated on auscultation.  Extremities: There is no pitting edema in the distal lower extremities bilaterally.   Skin: Warm and dry without trophic changes noted.   Musculoskeletal: exam reveals no obvious joint deformities, tenderness or joint swelling or erythema.   Neurologically:  Mental status: The patient is awake, alert and oriented in all 4 spheres. Her immediate and remote memory, attention, language skills and fund of knowledge are appropriate. There is no evidence of aphasia, agnosia, apraxia or anomia. Speech is clear with normal prosody and enunciation. Thought process is linear. Mood is normal and affect is normal.  Cranial nerves II - XII are as described above under HEENT exam.  Motor exam: Normal bulk, strength and tone is noted.  There is no tremor, Romberg is negative. Reflexes are 2+ throughout. Fine motor skills and coordination: grossly intact.  Cerebellar testing: No dysmetria or  intention tremor. There is no truncal or gait ataxia.  Sensory exam: intact to light touch in the upper and lower extremities.  Gait, station and balance: She stands easily. No veering to one side is noted. No leaning to one side is noted. Posture is age-appropriate and stance is narrow based. Gait shows normal stride length and normal pace. No problems turning are noted. Tandem walk is unremarkable.             Assessment and Plan:  In summary, ENA DEMARY is a very pleasant 49 y.o.-year old female with an underlying medical history of sickle cell trait, thyromegaly, vitamin D deficiency, palpitations, hypertension, chronic back pain, anxiety, depression, diabetes, anemia and obesity, whose history and physical exam are concerning for obstructive sleep apnea (OSA). I had a long chat with the patient about my findings and the diagnosis of OSA, its prognosis and treatment options. We talked about medical treatments, surgical interventions and non-pharmacological approaches. I explained in particular the risks and ramifications of untreated moderate to severe OSA, especially with respect to developing cardiovascular disease down the Road, including congestive heart failure, difficult to treat hypertension, cardiac arrhythmias, or stroke. Even type 2 diabetes has, in part, been linked to untreated OSA. Symptoms of untreated OSA include daytime sleepiness, memory problems, mood irritability and mood disorder such as depression and anxiety, lack of energy, as well as recurrent headaches, especially morning headaches. We talked about trying to maintain a healthy lifestyle in general, as well as the importance of weight control. We also talked about the importance of good sleep hygiene. I recommended the following at this time: sleep study.  I  explained the sleep test procedure to the patient and also outlined possible surgical and non-surgical treatment options of OSA, including the use of a custom-made dental device (which would require a referral to a specialist dentist or oral surgeon), upper airway surgical options, such as traditional UPPP or a novel less invasive surgical option in the form of Inspire hypoglossal nerve stimulation (which would involve a referral to an ENT surgeon). I also explained the CPAP treatment option to the patient, who indicated that she would be willing to try CPAP if the need arises. I explained the importance of being compliant with PAP treatment, not only for insurance purposes but primarily to improve Her symptoms, and for the patient's long term health benefit, including to reduce Her cardiovascular risks. I answered all her questions today and the patient was in agreement. I plan to see her back after the sleep study is completed and encouraged her to call with any interim questions, concerns, problems or updates.   Thank you very much for allowing me to participate in the care of this nice patient. If I can be of any further assistance to you please do not hesitate to call me at [phone].  Sincerely,   Star Age, MD, PhD

## 2020-06-30 ENCOUNTER — Telehealth: Payer: Self-pay

## 2020-06-30 NOTE — Telephone Encounter (Signed)
LVM for pt to call me back to schedule sleep study  

## 2020-07-04 MED FILL — JANUMET XR 100-1,000 MG TAB: 100-1000 | 30 days supply | Qty: 30 | Fill #1

## 2020-07-18 ENCOUNTER — Ambulatory Visit: Payer: No Typology Code available for payment source | Admitting: Family Medicine

## 2020-07-19 NOTE — Progress Notes (Deleted)
BH MD/PA/NP OP Progress Note  07/19/2020 3:09 PM Mary Roman  MRN:  [id number]  Chief Complaint:  HPI: *** Visit Diagnosis: No diagnosis found.  Past Psychiatric History: Please see initial evaluation for full details. I have reviewed the history. No updates at this time.     Past Medical History:  Past Medical History:  Diagnosis Date  . Absolute anemia 06/23/2018   Added automatically from request for surgery [id number]  . Anemia   . Anxiety   . Backache 12/24/2007  . Chronic back pain   . Diabetes mellitus, type II ([place])   . Headache(784.0)   . Hypertension   . Metabolic syndrome X 1/69/4503  . Obesity   . Palpitation   . Sickle cell trait ([place])   . Thyromegaly   . Unspecified vitamin D deficiency 02/19/2013    Past Surgical History:  Procedure Laterality Date  . DILATATION AND CURETTAGE/HYSTEROSCOPY WITH MINERVA N/A 03/25/2019   Procedure: DILATATION AND CURETTAGE/HYSTEROSCOPY WITH  ATTEMPTED MINERVA ENDOMETRIAL ABLATION;  Surgeon: Florian Buff, MD;  Location: [facility];  Service: Gynecology;  Laterality: N/A;  . None      Family Psychiatric History: Please see initial evaluation for full details. I have reviewed the history. No updates at this time.     Family History:  Family History  Problem Relation Age of Onset  . Diabetes Mother   . Hypertension Mother   . Stroke Mother   . Colon cancer Mother   . Alcohol abuse Mother   . Cancer - Colon Mother 4  . Pneumonia Father   . Hypertension Sister   . Anxiety disorder Sister   . Hypertension Sister   . Leukemia Sister   . Diabetes Maternal Grandmother   . Hypertension Maternal Grandmother   . Hypertension Maternal Grandfather   . Heart attack Maternal Uncle     Social History:  Social History   Socioeconomic History  . Marital status: Single    Spouse name: Not on file  . Number of children: 2  . Years of education: Not on file  . Highest education level: Not on file  Occupational History  .  Occupation: employed in medical office   Tobacco Use  . Smoking status: Never Smoker  . Smokeless tobacco: Never Used  Vaping Use  . Vaping Use: Never used  Substance and Sexual Activity  . Alcohol use: No    Comment: 01-17-2017 per pt no  . Drug use: No    Comment: 01-17-2017 per pt no  . Sexual activity: Yes    Partners: Male    Birth control/protection: Other-see comments    Comment: bf had vasectomy  Other Topics Concern  . Not on file  Social History Narrative  . Not on file   Social Determinants of Health   Financial Resource Strain: Not on file  Food Insecurity: Not on file  Transportation Needs: Not on file  Physical Activity: Not on file  Stress: Not on file  Social Connections: Not on file    Allergies: No Known Allergies  Metabolic Disorder Labs: Lab Results  Component Value Date   HGBA1C 8.6 (H) 04/18/2020   MPG 200.12 04/18/2020   MPG 206 01/13/2020   No results found for: PROLACTIN Lab Results  Component Value Date   CHOL 126 04/18/2020   TRIG 59 04/18/2020   HDL 30 (L) 04/18/2020   CHOLHDL 4.2 04/18/2020   VLDL 12 04/18/2020   LDLCALC 84 04/18/2020   LDLCALC 65 01/13/2020  Lab Results  Component Value Date   TSH 1.916 05/13/2019   TSH 1.23 03/06/2018    Therapeutic Level Labs: No results found for: LITHIUM No results found for: VALPROATE No components found for:  CBMZ  Current Medications: Current Outpatient Medications  Medication Sig Dispense Refill  . ARIPiprazole (ABILIFY) 15 MG tablet Take 1 tablet (15 mg total) by mouth daily. 30 tablet 1  . aspirin EC 81 MG tablet Take 1 tablet (81 mg total) by mouth daily. 90 tablet 3  . blood glucose meter kit and supplies Dispense based on patient and insurance preference. Once daily testing dx e11.9 1 each 0  . buPROPion (WELLBUTRIN XL) 150 MG 24 hr tablet Total of 450 mg daily. Take along with 300 mg tab 30 tablet 1  . buPROPion (WELLBUTRIN XL) 300 MG 24 hr tablet Take 1 tablet (300 mg  total) by mouth daily. 90 tablet 0  . glipiZIDE (GLUCOTROL XL) 10 MG 24 hr tablet Take two tablets by mouth once daily with breakfast 180 tablet 1  . HYDROcodone-acetaminophen (NORCO/VICODIN) 5-325 MG tablet Take one tablet by mouth once daily for back pain 30 tablet 0  . megestrol (MEGACE) 40 MG tablet TAKE 3 TABLETS BY MOUTH ONCE DAILY FOR 5 DAYS, THEN TAKE 2 TABLETS ONCE DAILY FOR 5 DAYS, THEN TAKE 1 TABLET ONCE DAILY. 45 tablet 5  . metoprolol tartrate (LOPRESSOR) 25 MG tablet TAKE 1 TABLET BY MOUTH DAILY. 90 tablet 0  . potassium chloride SA (KLOR-CON) 20 MEQ tablet Take 1 tablet (20 mEq total) by mouth 2 (two) times daily. 60 tablet 3  . rosuvastatin (CRESTOR) 5 MG tablet Take 1 tablet (5 mg total) by mouth daily. 90 tablet 3  . SitaGLIPtin-MetFORMIN HCl [phone] MG TB24 Take one tablet by mouth once every day 30 tablet 5  . temazepam (RESTORIL) 30 MG capsule Take 1 capsule (30 mg total) by mouth at bedtime as needed for sleep. 30 capsule 3  . temazepam (RESTORIL) 30 MG capsule Take 1 capsule (30 mg total) by mouth at bedtime as needed for sleep. 30 capsule 5  . triamterene-hydrochlorothiazide (MAXZIDE-25) 37.5-25 MG tablet Take one and a half tablets by mouth every day 135 tablet 3  . venlafaxine XR (EFFEXOR-XR) 150 MG 24 hr capsule 225 mg daily (150 mg + 75 mg) 90 capsule 0  . venlafaxine XR (EFFEXOR-XR) 75 MG 24 hr capsule 225 mg daily (150 mg + 75 mg) 90 capsule 0   No current facility-administered medications for this visit.     Musculoskeletal: Strength & Muscle Tone: N/A Gait & Station: N/A Patient leans: N/A  Psychiatric Specialty Exam: Review of Systems  There were no vitals taken for this visit.There is no height or weight on file to calculate BMI.  General Appearance: {Appearance:22683}  Eye Contact:  {BHH EYE CONTACT:22684}  Speech:  Clear and Coherent  Volume:  Normal  Mood:  {BHH MOOD:22306}  Affect:  {Affect (PAA):22687}  Thought Process:  Coherent  Orientation:   Full (Time, Place, and Person)  Thought Content: Logical   Suicidal Thoughts:  {ST/HT (PAA):22692}  Homicidal Thoughts:  {ST/HT (PAA):22692}  Memory:  Immediate;   Good  Judgement:  {Judgement (PAA):22694}  Insight:  {Insight (PAA):22695}  Psychomotor Activity:  Normal  Concentration:  Concentration: Good and Attention Span: Good  Recall:  Good  Fund of Knowledge: Good  Language: Good  Akathisia:  No  Handed:  Right  AIMS (if indicated): not done  Assets:  Communication Skills Desire for  Improvement  ADL's:  Intact  Cognition: WNL  Sleep:  {BHH GOOD/FAIR/POOR:22877}   Screenings: GAD-7   Flowsheet Row Counselor from 05/15/2018 in [facility] Office Visit from 01/08/2017 in [facility]  Total GAD-7 Score 17 14    PHQ2-9   Flowsheet Row Video Visit from 03/23/2020 in [facility] Office Visit from 12/03/2019 in [facility] Office Visit from 08/04/2019 in [facility] Office Visit from 05/13/2019 in [facility] Office Visit from 02/03/2019 in [facility]  PHQ-2 Total Score 2 0 2 4 1   PHQ-9 Total Score 6 2 13 13  --       Assessment and Plan:  LEINAALA CATANESE is a 49 y.o. year old female with a history of  depression, diabetes, who presents for follow up appointment for below.    1. MDD (major depressive disorder), recurrent episode, moderate ([place]) She continues to report depressive symptoms and anxiety in the context of upcoming holidays.  It was also found out that the patient was not taking medication as directed by accident.  Will uptitrate venlafaxine to optimize treatment for depression and anxiety.  Will continue current dose of bupropion at this time to target depression.  She has no known history of seizure.  Will continue Abilify adjunctive treatment for depression.  Discussed potential metabolic side effect and EPS.   2. Insomnia, unspecified type She continues  to have prominent fatigue,middle insomnia and snoring. She is scheduled for sleep study.  Plan  1.Increasevenlafaxine 225 mg daily (150 mg + 75 mg)- refill left according to [facility] 2. Continuebupropion333m daily  3.ContinueAbilify 158mdaily 4. Next appointment:1/4 at 8:20 for 20 mins, video -ontemazepam 30 mg at night as needed for sleep -on gabapentin for pain and hydroxyzine  Past trials of medication: fluoxetine,duloxetine,buspirone, Trazodone ("weird" feeling) temazepam, Xanax  The patient demonstrates the following risk factors for suicide: Chronic risk factors for suicide include: psychiatric disorder of depression, anxietyand chronic pain. Acute risk factorsfor suicide include: N/A. Protective factorsfor this patient include: positive social support, responsibility to others (children, family), coping skills and hope for the future. Considering these factors, the overall suicide risk at this point appears to be low. Patient isappropriate for outpatient follow up.   ReNorman ClayMD 07/19/2020, 3:09 PM

## 2020-08-01 ENCOUNTER — Telehealth (HOSPITAL_COMMUNITY): Payer: Self-pay | Admitting: *Deleted

## 2020-08-01 NOTE — Telephone Encounter (Signed)
Patient called and LMOM stating she have an appt on Jan 4th and would like to cancel appt and resch.   Staff called number pt provided on voicemail and phone would ring 1 time and hang up. Staff called 3 times in a row and it did the same thing.   Pt is Dr. Vanetta Shawl pt and staff was trying to give patient providers' new number so patient can cancel and resch appt but staff not able to leave message or reach pt.

## 2020-08-02 ENCOUNTER — Other Ambulatory Visit: Payer: Self-pay

## 2020-08-02 ENCOUNTER — Telehealth: Payer: No Typology Code available for payment source | Admitting: Psychiatry

## 2020-08-04 ENCOUNTER — Other Ambulatory Visit: Payer: Self-pay | Admitting: Family Medicine

## 2020-08-04 MED FILL — TRIAMTERENE-HCTZ 37.5-25 MG: 37.5-25 | 90 days supply | Qty: 90 | Fill #0

## 2020-08-08 ENCOUNTER — Other Ambulatory Visit: Payer: Self-pay

## 2020-08-08 ENCOUNTER — Ambulatory Visit (HOSPITAL_COMMUNITY)
Admission: RE | Admit: 2020-08-08 | Discharge: 2020-08-08 | Disposition: A | Discharge status: Home / Self Care | Payer: No Typology Code available for payment source | Source: Ambulatory Visit | Attending: Family Medicine | Admitting: Family Medicine

## 2020-08-08 DIAGNOSIS — Z1231 Encounter for screening mammogram for malignant neoplasm of breast: Secondary | ICD-10-CM | POA: Diagnosis present

## 2020-08-31 ENCOUNTER — Telehealth (INDEPENDENT_AMBULATORY_CARE_PROVIDER_SITE_OTHER): Payer: No Typology Code available for payment source | Admitting: Family Medicine

## 2020-08-31 ENCOUNTER — Encounter: Payer: Self-pay | Admitting: Family Medicine

## 2020-08-31 VITALS — Ht 65.0 in | Wt 208.0 lb

## 2020-08-31 DIAGNOSIS — Z1329 Encounter for screening for other suspected endocrine disorder: Secondary | ICD-10-CM

## 2020-08-31 DIAGNOSIS — Z0289 Encounter for other administrative examinations: Secondary | ICD-10-CM

## 2020-08-31 DIAGNOSIS — E785 Hyperlipidemia, unspecified: Secondary | ICD-10-CM

## 2020-08-31 DIAGNOSIS — E1159 Type 2 diabetes mellitus with other circulatory complications: Secondary | ICD-10-CM | POA: Diagnosis not present

## 2020-08-31 DIAGNOSIS — D509 Iron deficiency anemia, unspecified: Secondary | ICD-10-CM

## 2020-08-31 DIAGNOSIS — I10 Essential (primary) hypertension: Secondary | ICD-10-CM

## 2020-08-31 DIAGNOSIS — F331 Major depressive disorder, recurrent, moderate: Secondary | ICD-10-CM

## 2020-08-31 DIAGNOSIS — G8929 Other chronic pain: Secondary | ICD-10-CM | POA: Diagnosis not present

## 2020-08-31 NOTE — Patient Instructions (Addendum)
F/U in 2 weeks with blood pressure and weight taken at home and lab review, call if you need me sooner  Pain contract to be signed in am  Microalb, UDS, fasting lipid , cmp and EGFr, TSH HBA1C and vit D in am  Please get covid  Booster  It is important that you exercise regularly at least 30 minutes 5 times a week. If you develop chest pain, have severe difficulty breathing, or feel very tired, stop exercising immediately and seek medical attention  Think about what you will eat, plan ahead. Choose " clean, green, fresh or frozen" over canned, processed or packaged foods which are more sugary, salty and fatty. 70 to 75% of food eaten should be vegetables and fruit. Three meals at set times with snacks allowed between meals, but they must be fruit or vegetables. Aim to eat over a 12 hour period , example 7 am to 7 pm, and STOP after  your last meal of the day. Drink water,generally about 64 ounces per day, no other drink is as healthy. Fruit juice is best enjoyed in a healthy way, by EATING the fruit. Thanks for choosing [facility], we consider it a privelige to serve you.

## 2020-09-02 LAB — VITAMIN D 25 HYDROXY (VIT D DEFICIENCY, FRACTURES): Vit D, 25-Hydroxy: 18.6 ng/mL — ABNORMAL LOW (ref 30.0–100.0)

## 2020-09-02 LAB — CMP14+EGFR
ALT: 20 IU/L (ref 0–32)
AST: 17 IU/L (ref 0–40)
Albumin/Globulin Ratio: 1.2 (ref 1.2–2.2)
Albumin: 4.2 g/dL (ref 3.8–4.8)
Alkaline Phosphatase: 84 IU/L (ref 44–121)
BUN/Creatinine Ratio: 7 — ABNORMAL LOW (ref 9–23)
BUN: 9 mg/dL (ref 6–24)
Bilirubin Total: 0.9 mg/dL (ref 0.0–1.2)
CO2: 22 mmol/L (ref 20–29)
Calcium: 9.7 mg/dL (ref 8.7–10.2)
Chloride: 99 mmol/L (ref 96–106)
Creatinine, Ser: 1.31 mg/dL — ABNORMAL HIGH (ref 0.57–1.00)
GFR calc Af Amer: 55 mL/min/{1.73_m2} — ABNORMAL LOW (ref >59)
GFR calc non Af Amer: 48 mL/min/{1.73_m2} — ABNORMAL LOW (ref >59)
Globulin, Total: 3.4 g/dL (ref 1.5–4.5)
Glucose: 166 mg/dL — ABNORMAL HIGH (ref 65–99)
Potassium: 3.4 mmol/L — ABNORMAL LOW (ref 3.5–5.2)
Sodium: 139 mmol/L (ref 134–144)
Total Protein: 7.6 g/dL (ref 6.0–8.5)

## 2020-09-02 LAB — TSH: TSH: 1.4 u[IU]/mL (ref 0.450–4.500)

## 2020-09-02 LAB — LIPID PANEL
Chol/HDL Ratio: 4.9 ratio — ABNORMAL HIGH (ref 0.0–4.4)
Cholesterol, Total: 122 mg/dL (ref 100–199)
HDL: 25 mg/dL — ABNORMAL LOW (ref >39)
LDL Chol Calc (NIH): 80 mg/dL (ref 0–99)
Triglycerides: 88 mg/dL (ref 0–149)
VLDL Cholesterol Cal: 17 mg/dL (ref 5–40)

## 2020-09-02 LAB — HEMOGLOBIN A1C
Est. average glucose Bld gHb Est-mCnc: 252 mg/dL
Hgb A1c MFr Bld: 10.4 % — ABNORMAL HIGH (ref 4.8–5.6)

## 2020-09-04 ENCOUNTER — Encounter: Payer: Self-pay | Admitting: Family Medicine

## 2020-09-04 DIAGNOSIS — Z0289 Encounter for other administrative examinations: Secondary | ICD-10-CM | POA: Insufficient documentation

## 2020-09-04 MED ORDER — HYDROCODONE-ACETAMINOPHEN 5-325 MG PO TABS: ORAL_TABLET | ORAL | 0 refills | Status: AC

## 2020-09-04 NOTE — Assessment & Plan Note (Signed)
Contract reviewed and patient agrees to and understands terms of contract Audit and opioid toll screening completed

## 2020-09-04 NOTE — Assessment & Plan Note (Signed)
Hyperlipidemia:Low fat diet discussed and encouraged.   Lipid Panel  Lab Results  Component Value Date   CHOL 122 09/01/2020   HDL 25 (L) 09/01/2020   LDLCALC 80 09/01/2020   TRIG 88 09/01/2020   CHOLHDL 4.9 (H) 09/01/2020  will address at next visit

## 2020-09-04 NOTE — Assessment & Plan Note (Signed)
Prt to provide blood pressure for next visit DASH diet and commitment to daily physical activity for a minimum of 30 minutes discussed and encouraged, as a part of hypertension management. The importance of attaining a healthy weight is also discussed. Has been un controled in the past  BP/Weight 08/31/2020 06/28/2020 04/19/2020 03/23/2020 12/03/2019 10/23/2019 0/93/2671  Systolic BP - 245 809 983 382 505 397  Diastolic BP - 93 88 84 84 90 90  Wt. (Lbs) 208 206 202 211 211 218 217  BMI 34.61 34.28 33.61 35.11 35.11 36.28 36.11  Some encounter information is confidential and restricted. Go to Review Flowsheets activity to see all data.

## 2020-09-04 NOTE — Assessment & Plan Note (Signed)
The patient's Controlled Substance registry is reviewed and compliance confirmed. Adequacy of  Pain control and level of function is assessed. Medication dosing is adjusted as deemed appropriate. Twelve weeks of medication is prescribed , with a follow up appointment between 11 to 12 weeks .  

## 2020-09-04 NOTE — Assessment & Plan Note (Signed)
appt in 2 weeks to address once labs are available

## 2020-09-04 NOTE — Assessment & Plan Note (Signed)
Managed by Psych and controlled 

## 2020-09-04 NOTE — Progress Notes (Signed)
Virtual Visit via video  I connected with CNIYAH SPROULL on 09/04/20 at  8:00 AM EST   Location: Patient: home Provider: office     History of Present Illness: Encounter for ongoing chronic pain management. Pain contract reviewed ,and  risk assessment completed  Reports adequate control on current medication regime and denies abuse of illicit or prescription drugs     Observations/Objective: Ht 5\' 5"  (1.651 m)   Wt 208 lb (94.3 kg)   BMI 34.61 kg/m  Good communication with no confusion and intact memory. Alert and oriented x 3 No signs of respiratory distress during speech    Assessment and Plan: Encounter for chronic pain management The patient's Controlled Substance registry is reviewed and compliance confirmed. Adequacy of  Pain control and level of function is assessed. Medication dosing is adjusted as deemed appropriate. Twelve weeks of medication is prescribed , with a follow up appointment between 11 to 12 weeks .   Pain management contract agreement Contract reviewed and patient agrees to and understands terms of contract Audit and opioid toll screening completed   Type 2 diabetes mellitus with vascular disease ([place]) appt in 2 weeks to address once labs are available  Essential hypertension Prt to provide blood pressure for next visit DASH diet and commitment to daily physical activity for a minimum of 30 minutes discussed and encouraged, as a part of hypertension management. The importance of attaining a healthy weight is also discussed. Has been un controled in the past  BP/Weight 08/31/2020 06/28/2020 04/19/2020 03/23/2020 12/03/2019 10/23/2019 0/93/8182  Systolic BP - 993 716 967 893 810 175  Diastolic BP - 93 88 84 84 90 90  Wt. (Lbs) 208 206 202 211 211 218 217  BMI 34.61 34.28 33.61 35.11 35.11 36.28 36.11  Some encounter information is confidential and restricted. Go to Review Flowsheets activity to see all data.       Dyslipidemia (high LDL;  low HDL) Hyperlipidemia:Low fat diet discussed and encouraged.   Lipid Panel  Lab Results  Component Value Date   CHOL 122 09/01/2020   HDL 25 (L) 09/01/2020   LDLCALC 80 09/01/2020   TRIG 88 09/01/2020   CHOLHDL 4.9 (H) 09/01/2020  will address at next visit     Major depressive disorder, recurrent episode, moderate ([place]) Managed by  Psych and controlled    Follow Up Instructions:    I discussed the assessment and treatment plan with the patient. The patient was provided an opportunity to ask questions and all were answered. The patient agreed with the plan and demonstrated an understanding of the instructions.   The patient was advised to call back or seek an in-person evaluation if the symptoms worsen or if the condition fails to improve as anticipated.  I provided 26 minutes of face-to-face time during this encounter.   Tula Nakayama, MD

## 2020-09-14 ENCOUNTER — Telehealth: Payer: No Typology Code available for payment source | Admitting: Family Medicine

## 2020-09-22 ENCOUNTER — Other Ambulatory Visit (HOSPITAL_COMMUNITY): Payer: Self-pay | Admitting: Family Medicine

## 2020-09-22 ENCOUNTER — Telehealth (INDEPENDENT_AMBULATORY_CARE_PROVIDER_SITE_OTHER): Payer: No Typology Code available for payment source | Admitting: Family Medicine

## 2020-09-22 ENCOUNTER — Other Ambulatory Visit: Payer: Self-pay

## 2020-09-22 DIAGNOSIS — E785 Hyperlipidemia, unspecified: Secondary | ICD-10-CM | POA: Diagnosis not present

## 2020-09-22 DIAGNOSIS — E669 Obesity, unspecified: Secondary | ICD-10-CM | POA: Diagnosis not present

## 2020-09-22 DIAGNOSIS — I10 Essential (primary) hypertension: Secondary | ICD-10-CM | POA: Diagnosis not present

## 2020-09-22 DIAGNOSIS — E1159 Type 2 diabetes mellitus with other circulatory complications: Secondary | ICD-10-CM

## 2020-09-22 MED ORDER — ERGOCALCIFEROL 1.25 MG (50000 UT) PO CAPS: 50000.0000 [IU] | ORAL_CAPSULE | ORAL | 2 refills | Status: DC

## 2020-09-22 MED FILL — VIT D2 1.25 MG (50,000 UNIT: 1.25 MG | 84 days supply | Qty: 12 | Fill #0

## 2020-09-22 NOTE — Progress Notes (Signed)
Virtual Visit via Telephone Note  I connected with Mary Roman on 09/22/20 at  8:20 AM EST by telephone and verified that I am speaking with the correct person using two identifiers.  Location: Patient: home Provider: office   I discussed the limitations, risks, security and privacy concerns of performing an evaluation and management service by telephone and the availability of in person appointments. I also discussed with the patient that there may be a patient responsible charge related to this service. The patient expressed understanding and agreed to proceed.   History of Present Illness: F/U visit specifically to address uncontrolled diabetes, states she has now resumed her medications, is testing on average 3 times weekly, and blood sugar is still above 200 Denies polyuria, polydipsia, blurred vision , or hypoglycemic episodes. Has not weighte or checked blood pressure for visit. Still not walking regularly    Observations/Objective: There were no vitals taken for this visit. Good communication with no confusion and intact memory. Alert and oriented x 3 No signs of respiratory distress during speech    Assessment and Plan:  Type 2 diabetes mellitus with vascular disease ([place]) Uncontrolled , refer diabetic ed, medications and food choice with carb counting reviewed for approx 10 minutes. Needs to test and record F/U in 4 weeks with diary  Essential hypertension DASH diet and commitment to daily physical activity for a minimum of 30 minutes discussed and encouraged, as a part of hypertension management. The importance of attaining a healthy weight is also discussed.  BP/Weight 08/31/2020 06/28/2020 04/19/2020 03/23/2020 12/03/2019 10/23/2019 9/37/9024  Systolic BP - 097 353 299 242 683 419  Diastolic BP - 93 88 84 84 90 90  Wt. (Lbs) 208 206 202 211 211 218 217  BMI 34.61 34.28 33.61 35.11 35.11 36.28 36.11  Some encounter information is confidential and restricted. Go  to Review Flowsheets activity to see all data.       Obesity (BMI 30.0-34.9)  Patient re-educated about  the importance of commitment to a  minimum of 150 minutes of exercise per week as able.  The importance of healthy food choices with portion control discussed, as well as eating regularly and within a 12 hour window most days. The need to choose "clean , green" food 50 to 75% of the time is discussed, as well as to make water the primary drink and set a goal of 64 ounces water daily.    Weight /BMI 08/31/2020 06/28/2020 04/19/2020  WEIGHT 208 lb 206 lb 202 lb  HEIGHT 5\' 5"  5\' 5"  5\' 5"   BMI 34.61 kg/m2 34.28 kg/m2 33.61 kg/m2  Some encounter information is confidential and restricted. Go to Review Flowsheets activity to see all data.      Dyslipidemia (high LDL; low HDL) Hyperlipidemia:Low fat diet discussed and encouraged.   Lipid Panel  Lab Results  Component Value Date   CHOL 122 09/01/2020   HDL 25 (L) 09/01/2020   LDLCALC 80 09/01/2020   TRIG 88 09/01/2020   CHOLHDL 4.9 (H) 09/01/2020   Needs to increase HDL, needs regular exercise     Follow Up Instructions:    I discussed the assessment and treatment plan with the patient. The patient was provided an opportunity to ask questions and all were answered. The patient agreed with the plan and demonstrated an understanding of the instructions.   The patient was advised to call back or seek an in-person evaluation if the symptoms worsen or if the condition fails to improve as anticipated.  I provided 15 minutes of non-face-to-face time during this encounter.   Tula Nakayama, MD

## 2020-09-22 NOTE — Patient Instructions (Addendum)
F/U in 4 weeks with diabetic log, blood pressure and weight, call if you need me sooner  You are referred again to the diabetic educator as we discussed, very important that you DO go so that you understand how to eat to get control of your blood sugar   Test and record your blood sugar at least one time every day, we will review at next visit  Goal for fasting blood sugar and before any meal ranges from 80 to 120 and 2 hours after any meal or at bedtime should be between 130 to 170.  It is important that you exercise regularly at least 30 minutes 5 times a week. If you develop chest pain, have severe difficulty breathing, or feel very tired, stop exercising immediately and seek medical attention    If you follow these steps your blood sugar and kidney function will improve  Once weekly vit D is prescribed

## 2020-09-23 ENCOUNTER — Encounter: Payer: Self-pay | Admitting: Family Medicine

## 2020-09-23 NOTE — Assessment & Plan Note (Signed)
DASH diet and commitment to daily physical activity for a minimum of 30 minutes discussed and encouraged, as a part of hypertension management. The importance of attaining a healthy weight is also discussed.  BP/Weight 08/31/2020 06/28/2020 04/19/2020 03/23/2020 12/03/2019 10/23/2019 8/32/5498  Systolic BP - 264 158 309 407 680 881  Diastolic BP - 93 88 84 84 90 90  Wt. (Lbs) 208 206 202 211 211 218 217  BMI 34.61 34.28 33.61 35.11 35.11 36.28 36.11  Some encounter information is confidential and restricted. Go to Review Flowsheets activity to see all data.

## 2020-09-23 NOTE — Assessment & Plan Note (Signed)
Uncontrolled , refer diabetic ed, medications and food choice with carb counting reviewed for approx 10 minutes. Needs to test and record F/U in 4 weeks with diary

## 2020-09-23 NOTE — Assessment & Plan Note (Signed)
Hyperlipidemia:Low fat diet discussed and encouraged.   Lipid Panel  Lab Results  Component Value Date   CHOL 122 09/01/2020   HDL 25 (L) 09/01/2020   LDLCALC 80 09/01/2020   TRIG 88 09/01/2020   CHOLHDL 4.9 (H) 09/01/2020   Needs to increase HDL, needs regular exercise

## 2020-09-23 NOTE — Assessment & Plan Note (Signed)
  Patient re-educated about  the importance of commitment to a  minimum of 150 minutes of exercise per week as able.  The importance of healthy food choices with portion control discussed, as well as eating regularly and within a 12 hour window most days. The need to choose "clean , green" food 50 to 75% of the time is discussed, as well as to make water the primary drink and set a goal of 64 ounces water daily.    Weight /BMI 08/31/2020 06/28/2020 04/19/2020  WEIGHT 208 lb 206 lb 202 lb  HEIGHT 5\' 5"  5\' 5"  5\' 5"   BMI 34.61 kg/m2 34.28 kg/m2 33.61 kg/m2  Some encounter information is confidential and restricted. Go to Review Flowsheets activity to see all data.

## 2020-09-26 LAB — HM DIABETES EYE EXAM

## 2020-10-04 ENCOUNTER — Other Ambulatory Visit: Payer: Self-pay | Admitting: Family Medicine

## 2020-10-04 MED FILL — METOPROLOL TARTRATE 25 MG T: 25 | 90 days supply | Qty: 90 | Fill #0

## 2020-10-05 MED FILL — JANUMET XR 100-1,000 MG TAB: 100-1000 | 30 days supply | Qty: 30 | Fill #2

## 2020-10-17 MED FILL — glipiZIDE ER 10 MG TB24: 10 | 90 days supply | Qty: 180 | Fill #1

## 2020-10-17 MED FILL — TRIAMTERENE-HCTZ 37.5-25 MG: 37.5-25 | 90 days supply | Qty: 90 | Fill #1

## 2020-10-20 ENCOUNTER — Ambulatory Visit: Payer: No Typology Code available for payment source | Admitting: Family Medicine

## 2020-10-21 ENCOUNTER — Other Ambulatory Visit: Payer: Self-pay

## 2020-10-21 ENCOUNTER — Encounter: Payer: Self-pay | Admitting: Dietician

## 2020-10-21 ENCOUNTER — Encounter: Payer: No Typology Code available for payment source | Attending: Family Medicine | Admitting: Dietician

## 2020-10-21 DIAGNOSIS — E1159 Type 2 diabetes mellitus with other circulatory complications: Secondary | ICD-10-CM | POA: Diagnosis not present

## 2020-10-21 NOTE — Patient Instructions (Addendum)
Work towards eating three meals a day, about 5-6 hours apart!  Begin to recognize carbohydrates in your food choices!  Work towards eating consistent carbs at each meal.  Begin to build your meals using the proportions of the Balanced Plate. . First, select your carb choices for the meal . Next, select your source of protein to pair with your carb choices. . Finally, complete the remaining half of your meal with a variety of non-starchy vegetables.  Check your blood sugar each morning and bring your meter to your follow-up.  Start having breakfast on the weekends, and then gradually begin to have it more during the week.

## 2020-10-21 NOTE — Progress Notes (Signed)
Medical Nutrition Therapy  Appointment Start time:  0800  Appointment End time:  0900  Primary concerns today: Diabetes  Referral diagnosis: E11.59 Type 2 Diabetes with vascular disease Preferred learning style: No preference indicated Learning readiness: Contemplating   NUTRITION ASSESSMENT   Anthropometrics  Wt: 200 lbs Ht: 5'5" Body mass index is 33.28 kg/m.   Clinical Medical Hx: Prediabetes, HTN Medications: Glipizide, Sitagliptin-metformin, meroprolol tartrate, triamterene-hydrochlorothiazide Labs:  A1c - 10.4, HDL - 25 (LOW), Vit D - 18.6 (LOW), Creatinine - 1.31 (High), GFR - 55 (low) Notable Signs/Symptoms: N/A  Lifestyle & Dietary Hx  Pt reports being a diabetic for about 5 years, and has taken medicines on and off over this period. Pt reports trying exercise and diet in the past, states they are up and down with adhering to these changes. Pt reports they were walking on the treadmill during lunch, pt would only have a small salad after that for lunch.  Pt reports just starting back checking BG regularly. CBG last night was ~120. Pt only checks at night because they do not have time in the morning. Pt states they do not get up early enough and have to rush in the morning to get to work. Pt reports SSB are their downfall.  Pt has been drinking diet beverages instead for the past month. Pt reports only having 30 minutes lunch break now, used to have an hour and they would take walks during this time. Pt states they are not a breakfast person, not hungry in the mornings until 11 am. Starts work at 9, wakes up at 7. Sometimes drinks a cup of coffee. Pt reports very little water intake, if they do it will be flavored.  Estimated daily fluid intake: 48 oz Supplements: Vit D (50,000 IU) Sleep: Difficulty sleeping if not taking temazepam, usually 7 hours Stress / self-care: Fluctuates, depression/anxiety influence stress levels Current average weekly physical activity: ADLs,  sits all day at work  24-Hr Dietary Recall First Meal: no breakfast Snack: none Second Meal: Boneless chicken, mac and cheese, greens, coke zero Snack: snack size mounds bar Third Meal: Spaghetti, meat sauce, Coke zero Snack: Pack of mini chocolate donuts (10-11 pm) Beverages: Coke zero   NUTRITION DIAGNOSIS  NB-1.1 Food and nutrition-related knowledge deficit As related to diabetes.  As evidenced by A1c of 10.4, sedentary lifestyle, skipping breakfast, and previously consuming large amounts of SSB.   NUTRITION INTERVENTION  Nutrition education (E-1) on the following topics:  Educated patient on the pathophysiology of diabetes. This includes why our bodies need circulating blood sugar, the relationship between insulin and blood sugar, and the results of insulin resistance and/or pancreatic insufficiency on the development of diabetes. Educated patient on factors that contribute to elevation of blood sugars, such as stress, illness, injury,and food choices. Discussed the role that physical activity plays in lowering blood sugar. Educate patient on the three main macronutrients. Protein, fats, and carbohydrates. Discussed how each of these macronutrients affect blood sugar levels, especially carbohydrate, and the importance of eating a consistent amount of carbohydrate throughout the day.    Handouts Provided Include   Balanced Plate  Learning Style & Readiness for Change Teaching method utilized: Visual & Auditory  Demonstrated degree of understanding via: Teach Back  Barriers to learning/adherence to lifestyle change: none  Goals Established by Pt  Work towards eating three meals a day, about 5-6 hours apart!  Begin to recognize carbohydrates in your food choices!  Work towards eating consistent carbs at each meal.  Begin to build your meals using the proportions of the Balanced Plate.  First, select your carb choices for the meal  Next, select your source of protein to pair  with your carb choices.  Finally, complete the remaining half of your meal with a variety of non-starchy vegetables.  Check your blood sugar each morning and bring your meter to your follow-up.  Start having breakfast on the weekends, and then gradually begin to have it more during the week.   MONITORING & EVALUATION Dietary intake, weekly physical activity, and carb intake PRN.  Next Steps  Patient is to call to set up follow up with RDN.

## 2020-10-24 ENCOUNTER — Other Ambulatory Visit: Payer: Self-pay

## 2020-10-24 ENCOUNTER — Encounter (INDEPENDENT_AMBULATORY_CARE_PROVIDER_SITE_OTHER): Payer: Self-pay | Admitting: *Deleted

## 2020-10-24 ENCOUNTER — Telehealth: Payer: Self-pay

## 2020-10-24 DIAGNOSIS — Z1211 Encounter for screening for malignant neoplasm of colon: Secondary | ICD-10-CM

## 2020-10-24 NOTE — Telephone Encounter (Signed)
Please place order for screening colonoscopy.

## 2020-10-24 NOTE — Telephone Encounter (Signed)
Order placed

## 2020-10-27 ENCOUNTER — Ambulatory Visit: Payer: No Typology Code available for payment source | Admitting: Family Medicine

## 2020-10-28 NOTE — Progress Notes (Deleted)
[facility] MD/PA/NP OP Progress Note  10/28/2020 10:21 AM LITICIA Mary  MRN:  [id number]  Chief Complaint:  HPI: *** Visit Diagnosis: No diagnosis found.  Past Psychiatric History: Please see initial evaluation for full details. I have reviewed the history. No updates at this time.     Past Medical History:  Past Medical History:  Diagnosis Date  . Absolute anemia 06/23/2018   Added automatically from request for surgery [id number]  . Anemia   . Anxiety   . Backache 12/24/2007  . Chronic back pain   . Diabetes mellitus, type II ([place])   . Headache(784.0)   . Hypertension   . Metabolic syndrome X 0/78/6754  . Obesity   . Palpitation   . Sickle cell trait ([place])   . Thyromegaly   . Unspecified vitamin D deficiency 02/19/2013    Past Surgical History:  Procedure Laterality Date  . DILATATION AND CURETTAGE/HYSTEROSCOPY WITH MINERVA N/A 03/25/2019   Procedure: DILATATION AND CURETTAGE/HYSTEROSCOPY WITH  ATTEMPTED MINERVA ENDOMETRIAL ABLATION;  Surgeon: Florian Buff, MD;  Location: [facility];  Service: Gynecology;  Laterality: N/A;  . None      Family Psychiatric History: Please see initial evaluation for full details. I have reviewed the history. No updates at this time.     Family History:  Family History  Problem Relation Age of Onset  . Diabetes Mother   . Hypertension Mother   . Stroke Mother   . Colon cancer Mother   . Alcohol abuse Mother   . Cancer - Colon Mother 62  . Pneumonia Father   . Hypertension Sister   . Anxiety disorder Sister   . Hypertension Sister   . Leukemia Sister   . Diabetes Maternal Grandmother   . Hypertension Maternal Grandmother   . Hypertension Maternal Grandfather   . Heart attack Maternal Uncle     Social History:  Social History   Socioeconomic History  . Marital status: Single    Spouse name: Not on file  . Number of children: 2  . Years of education: Not on file  . Highest education level: Not on file  Occupational History  .  Occupation: employed in medical office   Tobacco Use  . Smoking status: Never Smoker  . Smokeless tobacco: Never Used  Vaping Use  . Vaping Use: Never used  Substance and Sexual Activity  . Alcohol use: No    Comment: 01-17-2017 per pt no  . Drug use: No    Comment: 01-17-2017 per pt no  . Sexual activity: Yes    Partners: Male    Birth control/protection: Other-see comments    Comment: bf had vasectomy  Other Topics Concern  . Not on file  Social History Narrative  . Not on file   Social Determinants of Health   Financial Resource Strain: Not on file  Food Insecurity: Not on file  Transportation Needs: Not on file  Physical Activity: Not on file  Stress: Not on file  Social Connections: Not on file    Allergies: No Known Allergies  Metabolic Disorder Labs: Lab Results  Component Value Date   HGBA1C 10.4 (H) 09/01/2020   MPG 200.12 04/18/2020   MPG 206 01/13/2020   No results found for: PROLACTIN Lab Results  Component Value Date   CHOL 122 09/01/2020   TRIG 88 09/01/2020   HDL 25 (L) 09/01/2020   CHOLHDL 4.9 (H) 09/01/2020   VLDL 12 04/18/2020   LDLCALC 80 09/01/2020   LDLCALC 84 04/18/2020  Lab Results  Component Value Date   TSH 1.400 09/01/2020   TSH 1.916 05/13/2019    Therapeutic Level Labs: No results found for: LITHIUM No results found for: VALPROATE No components found for:  CBMZ  Current Medications: Current Outpatient Medications  Medication Sig Dispense Refill  . ARIPiprazole (ABILIFY) 15 MG tablet Take 1 tablet (15 mg total) by mouth daily. 30 tablet 1  . aspirin EC 81 MG tablet Take 1 tablet (81 mg total) by mouth daily. 90 tablet 3  . blood glucose meter kit and supplies Dispense based on patient and insurance preference. Once daily testing dx e11.9 1 each 0  . buPROPion (WELLBUTRIN XL) 150 MG 24 hr tablet Total of 450 mg daily. Take along with 300 mg tab 30 tablet 1  . buPROPion (WELLBUTRIN XL) 300 MG 24 hr tablet Take 1 tablet (300  mg total) by mouth daily. 90 tablet 0  . ergocalciferol (VITAMIN D2) 1.25 MG (50000 UT) capsule Take 1 capsule (50,000 Units total) by mouth once a week. One capsule once weekly 12 capsule 2  . glipiZIDE (GLUCOTROL XL) 10 MG 24 hr tablet Take two tablets by mouth once daily with breakfast 180 tablet 1  . megestrol (MEGACE) 40 MG tablet TAKE 3 TABLETS BY MOUTH ONCE DAILY FOR 5 DAYS, THEN TAKE 2 TABLETS ONCE DAILY FOR 5 DAYS, THEN TAKE 1 TABLET ONCE DAILY. 45 tablet 5  . metoprolol tartrate (LOPRESSOR) 25 MG tablet TAKE 1 TABLET BY MOUTH DAILY. 90 tablet 0  . potassium chloride SA (KLOR-CON) 20 MEQ tablet Take 1 tablet (20 mEq total) by mouth 2 (two) times daily. (Patient not taking: Reported on 10/21/2020) 60 tablet 3  . rosuvastatin (CRESTOR) 5 MG tablet Take 1 tablet (5 mg total) by mouth daily. 90 tablet 3  . SitaGLIPtin-MetFORMIN HCl [phone] MG TB24 Take one tablet by mouth once every day 30 tablet 5  . temazepam (RESTORIL) 30 MG capsule Take 1 capsule (30 mg total) by mouth at bedtime as needed for sleep. 30 capsule 3  . triamterene-hydrochlorothiazide (MAXZIDE-25) 37.5-25 MG tablet TAKE 1 TABLET BY MOUTH DAILY. 90 tablet 3  . venlafaxine XR (EFFEXOR-XR) 150 MG 24 hr capsule 225 mg daily (150 mg + 75 mg) 90 capsule 0  . venlafaxine XR (EFFEXOR-XR) 75 MG 24 hr capsule 225 mg daily (150 mg + 75 mg) 90 capsule 0   No current facility-administered medications for this visit.     Musculoskeletal: Strength & Muscle Tone: N/A Gait & Station: N/A Patient leans: N/A  Psychiatric Specialty Exam: Review of Systems  There were no vitals taken for this visit.There is no height or weight on file to calculate BMI.  General Appearance: Fairly Groomed  Eye Contact:  Good  Speech:  Clear and Coherent  Volume:  Normal  Mood:  {BHH MOOD:22306}  Affect:  {Affect (PAA):22687}  Thought Process:  Coherent  Orientation:  Full (Time, Place, and Person)  Thought Content: Logical   Suicidal Thoughts:   {ST/HT (PAA):22692}  Homicidal Thoughts:  {ST/HT (PAA):22692}  Memory:  Immediate;   Good  Judgement:  {Judgement (PAA):22694}  Insight:  {Insight (PAA):22695}  Psychomotor Activity:  Normal  Concentration:  Concentration: Good and Attention Span: Good  Recall:  Good  Fund of Knowledge: Good  Language: Good  Akathisia:  No  Handed:  Right  AIMS (if indicated): not done  Assets:  Communication Skills Desire for Improvement  ADL's:  Intact  Cognition: WNL  Sleep:  {BHH GOOD/FAIR/POOR:22877}  Screenings: GAD-7   Flowsheet Row Counselor from 05/15/2018 in [facility] Office Visit from 01/08/2017 in [facility]  Total GAD-7 Score 17 14    PHQ2-9   Flowsheet Row Nutrition [facility]/21/2020 [facility] Video Visit from 08/31/2020 in [facility] Video Visit from 03/23/2020 in [facility] Office Visit from 12/03/2019 in [facility] Office Visit from 08/04/2019 in [facility]  PHQ-2 Total Score 0 0 2 0 2  PHQ-9 Total Score -- -- _0 Assessment and Plan:  Mary Roman is a 50 y.o. year old female with a history of  depression, diabetes, who presents for follow up appointment for below.    1. MDD (major depressive disorder), recurrent episode, moderate ([place]) She continues to report depressive symptoms and anxiety in the context of upcoming holidays.  It was also found out that the patient was not taking medication as directed by accident.  Will uptitrate venlafaxine to optimize treatment for depression and anxiety.  Will continue current dose of bupropion at this time to target depression.  She has no known history of seizure.  Will continue Abilify adjunctive treatment for depression.  Discussed potential metabolic side effect and EPS.   2. Insomnia, unspecified type She continues to have prominent fatigue,middle insomnia and snoring.  She is scheduled for sleep study.  Plan  1.Increasevenlafaxine 225 mg daily (150 mg + 75 mg)- refill left according to [facility] 2. Continuebupropion355m daily  3.ContinueAbilify 128mdaily 4. Next appointment:1/4 at 8:20 for 20 mins, video -ontemazepam 30 mg at night as needed for sleep -on gabapentin for pain and hydroxyzine  Past trials of medication: fluoxetine,duloxetine,buspirone, Trazodone ("weird" feeling) temazepam, Xanax  The patient demonstrates the following risk factors for suicide: Chronic risk factors for suicide include: psychiatric disorder of depression, anxietyand chronic pain. Acute risk factorsfor suicide include: N/A. Protective factorsfor this patient include: positive social support, responsibility to others (children, family), coping skills and hope for the future. Considering these factors, the overall suicide risk at this point appears to be low. Patient isappropriate for outpatient follow up.  ReNorman ClayMD 10/28/2020, 10:21 AM

## 2020-10-29 ENCOUNTER — Other Ambulatory Visit (HOSPITAL_COMMUNITY): Payer: Self-pay

## 2020-11-02 ENCOUNTER — Telehealth: Payer: No Typology Code available for payment source | Admitting: Psychiatry

## 2020-11-02 NOTE — Progress Notes (Signed)
Virtual Visit via Video Note  I connected with Mary Roman on 11/08/20 at  8:00 AM EDT by a video enabled telemedicine application and verified that I am speaking with the correct person using two identifiers.  Location: Patient: home Provider: office Persons participated in the visit- patient, provider   I discussed the limitations of evaluation and management by telemedicine and the availability of in person appointments. The patient expressed understanding and agreed to proceed.   I discussed the assessment and treatment plan with the patient. The patient was provided an opportunity to ask questions and all were answered. The patient agreed with the plan and demonstrated an understanding of the instructions.   The patient was advised to call back or seek an in-person evaluation if the symptoms worsen or if the condition fails to improve as anticipated.  I provided 17 minutes of non-face-to-face time during this encounter.   [name], MD    [facility] MD/PA/NP OP Progress Note  11/08/2020 8:44 AM Mary Roman  MRN:  [id number]  Chief Complaint:  Chief Complaint    Follow-up; Depression     HPI:  This is a follow-up appointment for depression and anxiety.  She states that she is not too good.  She has been feeling depressed, and stopped taking medication a few months ago with the thought that medication was not working.  She did not want to go to work either, although she did not miss any days.  She reports stressed talking with the patient's, and her coworker, who works together closely.  She stays in the house on weekends.  She is also stressed that her diabetes is not under good control.  She has been able to take care of her daughter, and reports good relationship with her family.  Although she initially answers "I do not know "to make any change in her lifestyle, she is wanting to try taking a walk regularly later in the day.  She lost some weight since she stopped  drinking Coke.  She has depressive symptoms as in PHQ-9.  She denies SI.  She feels anxious, tense and irritable at times.  She denies panic attacks.  She has not been on any medication for the past 2 months.  She denies any side effect when she uptitrated venlafaxine.   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 one son, one at age 46 yo  Visit Diagnosis:    ICD-10-CM   1. MDD (major depressive disorder), recurrent episode, moderate (HCC)  F33.1   2. Anxiety state  F41.1     Past Psychiatric History: Please see initial evaluation for full details. I have reviewed the history. No updates at this time.     Past Medical History:  Past Medical History:  Diagnosis Date  . Absolute anemia 06/23/2018   Added automatically from request for surgery [id number]  . Anemia   . Anxiety   . Backache 12/24/2007  . Chronic back pain   . Diabetes mellitus, type II ([place])   . Headache(784.0)   . Hypertension   . Metabolic syndrome X 01/26/4764  . Obesity   . Palpitation   . Sickle cell trait ([place])   . Thyromegaly   . Unspecified vitamin D deficiency 02/19/2013    Past Surgical History:  Procedure Laterality Date  . DILATATION AND CURETTAGE/HYSTEROSCOPY WITH MINERVA N/A 03/25/2019   Procedure: DILATATION AND CURETTAGE/HYSTEROSCOPY WITH  ATTEMPTED MINERVA ENDOMETRIAL ABLATION;  Surgeon: Tania Ade  H, MD;  Location: [facility];  Service: Gynecology;  Laterality: N/A;  . None      Family Psychiatric History: Please see initial evaluation for full details. I have reviewed the history. No updates at this time.     Family History:  Family History  Problem Relation Age of Onset  . Diabetes Mother   . Hypertension Mother   . Stroke Mother   . Colon cancer Mother   . Alcohol abuse Mother   . Cancer - Colon Mother 80  . Pneumonia Father   . Hypertension Sister   . Anxiety  disorder Sister   . Hypertension Sister   . Leukemia Sister   . Diabetes Maternal Grandmother   . Hypertension Maternal Grandmother   . Hypertension Maternal Grandfather   . Heart attack Maternal Uncle     Social History:  Social History   Socioeconomic History  . Marital status: Single    Spouse name: Not on file  . Number of children: 2  . Years of education: Not on file  . Highest education level: Not on file  Occupational History  . Occupation: employed in medical office   Tobacco Use  . Smoking status: Never Smoker  . Smokeless tobacco: Never Used  Vaping Use  . Vaping Use: Never used  Substance and Sexual Activity  . Alcohol use: No  . Drug use: No  . Sexual activity: Yes    Partners: Male    Birth control/protection: Other-see comments    Comment: bf had vasectomy  Other Topics Concern  . Not on file  Social History Narrative  . Not on file   Social Determinants of Health   Financial Resource Strain: Not on file  Food Insecurity: Not on file  Transportation Needs: Not on file  Physical Activity: Not on file  Stress: Not on file  Social Connections: Not on file    Allergies: No Known Allergies  Metabolic Disorder Labs: Lab Results  Component Value Date   HGBA1C 10.4 (H) 09/01/2020   MPG 200.12 04/18/2020   MPG 206 01/13/2020   No results found for: PROLACTIN Lab Results  Component Value Date   CHOL 122 09/01/2020   TRIG 88 09/01/2020   HDL 25 (L) 09/01/2020   CHOLHDL 4.9 (H) 09/01/2020   VLDL 12 04/18/2020   LDLCALC 80 09/01/2020   LDLCALC 84 04/18/2020   Lab Results  Component Value Date   TSH 1.400 09/01/2020   TSH 1.916 05/13/2019    Therapeutic Level Labs: No results found for: LITHIUM No results found for: VALPROATE No components found for:  CBMZ  Current Medications: Current Outpatient Medications  Medication Sig Dispense Refill  . ARIPiprazole (ABILIFY) 15 MG tablet Take 1 tablet (15 mg total) by mouth daily. 30 tablet 1   . aspirin EC 81 MG tablet Take 1 tablet (81 mg total) by mouth daily. 90 tablet 3  . blood glucose meter kit and supplies Dispense based on patient and insurance preference. Once daily testing dx e11.9 1 each 0  . buPROPion (WELLBUTRIN XL) 150 MG 24 hr tablet Total of 450 mg daily. Take along with 300 mg tab 30 tablet 1  . buPROPion (WELLBUTRIN XL) 300 MG 24 hr tablet Take 1 tablet (300 mg total) by mouth daily. 90 tablet 0  . ergocalciferol (VITAMIN D2) 1.25 MG (50000 UT) capsule Take 1 capsule (50,000 Units total) by mouth once a week. One capsule once weekly 12 capsule 2  . glipiZIDE (GLUCOTROL XL) 10 MG  24 hr tablet TAKE TWO TABLETS BY MOUTH ONCE DAILY WITH BREAKFAST 180 tablet 1  . megestrol (MEGACE) 40 MG tablet TAKE 3 TABLETS BY MOUTH ONCE DAILY FOR 5 DAYS, THEN TAKE 2 TABLETS ONCE DAILY FOR 5 DAYS, THEN TAKE 1 TABLET ONCE DAILY. (Patient taking differently: Take 40 mg by mouth daily.) 45 tablet 5  . metoprolol tartrate (LOPRESSOR) 25 MG tablet TAKE 1 TABLET BY MOUTH DAILY. 90 tablet 0  . polyethylene glycol-electrolytes (TRILYTE) 420 g solution Take 4,000 mLs by mouth as directed. 4000 mL 0  . rosuvastatin (CRESTOR) 5 MG tablet Take 1 tablet (5 mg total) by mouth daily. 90 tablet 3  . SitaGLIPtin-MetFORMIN HCl [id number] MG TB24 TAKE ONE TABLET BY MOUTH ONCE EVERY DAY 30 tablet 5  . temazepam (RESTORIL) 30 MG capsule Take 1 capsule (30 mg total) by mouth at bedtime as needed for sleep. 30 capsule 3  . triamterene-hydrochlorothiazide (MAXZIDE-25) 37.5-25 MG tablet TAKE 1 TABLET BY MOUTH DAILY. 90 tablet 3  . venlafaxine XR (EFFEXOR-XR) 150 MG 24 hr capsule 225 mg daily (150 mg + 75 mg) 90 capsule 0  . venlafaxine XR (EFFEXOR-XR) 75 MG 24 hr capsule 225 mg daily (150 mg + 75 mg) 90 capsule 0  . Vitamin D, Ergocalciferol, (DRISDOL) 1.25 MG (50000 UNIT) CAPS capsule TAKE 1 CAPSULE (50,000 UNITS TOTAL) BY MOUTH ONCE A WEEK. 12 capsule 2   No current facility-administered medications for this  visit.     Musculoskeletal: Strength & Muscle Tone: N/A Gait & Station: N/A Patient leans: N/A  Psychiatric Specialty Exam: Review of Systems  Psychiatric/Behavioral: Positive for decreased concentration, dysphoric mood and sleep disturbance. Negative for agitation, behavioral problems, confusion, hallucinations, self-injury and suicidal ideas. The patient is nervous/anxious. The patient is not hyperactive.   All other systems reviewed and are negative.   There were no vitals taken for this visit.There is no height or weight on file to calculate BMI.  General Appearance: Fairly Groomed  Eye Contact:  Good  Speech:  Clear and Coherent  Volume:  Normal  Mood:  Depressed  Affect:  Appropriate, Congruent and down  Thought Process:  Coherent  Orientation:  Full (Time, Place, and Person)  Thought Content: Logical   Suicidal Thoughts:  No  Homicidal Thoughts:  No  Memory:  Immediate;   Good  Judgement:  Good  Insight:  Fair  Psychomotor Activity:  Normal  Concentration:  Concentration: Good and Attention Span: Good  Recall:  Good  Fund of Knowledge: Good  Language: Good  Akathisia:  No  Handed:  Right  AIMS (if indicated): not done  Assets:  Communication Skills Desire for Improvement  ADL's:  Intact  Cognition: WNL  Sleep:  Poor   Screenings: GAD-7   Flowsheet Row Counselor from 05/15/2018 in [place] Office Visit from 01/08/2017 in [facility]  Total GAD-7 Score 17 14    PHQ2-9   Flowsheet Row Video Visit from 11/08/2020 in [place] from 10/21/2020 in [facility] Video Visit from 08/31/2020 in [facility] Video Visit from 03/23/2020 in [facility] Office Visit from 12/03/2019 in [facility]  PHQ-2 Total Score 2 0 0 2 0  PHQ-9 Total Score 9 -- -- 6 2    Flowsheet Row Video Visit from 11/08/2020 in  [place] No Risk       Assessment and Plan:  FELICE HOPE is  a 50 y.o. year old female with a history of depression, diabetes , who presents for follow up appointment for below.   1. MDD (major depressive disorder), recurrent episode, moderate ([place]) 2. Anxiety state She reports worsening in depressive symptoms and anxiety in the setting of non adherence to treatment.  Will restart venlafaxine to target depression and anxiety.  Will restart bupropion at the following week to target depression.  She has no known history of seizure.  Will hold Abilify at this time given her medical condition of diabetes at this time, although this medication may be restarted in the future.  Discussed behavioral activation; she agrees to try taking a walk in the afternoon. Will have close follow up to improve adherence to treatment.   2. Insomnia, unspecified type She continues to have prominent fatigue,middle insomnia and snoring.  She was referred for sleep evaluation.  She is advised to contact the clinic for a follow-up appointment.   Plan I have reviewed and updated plans as below - she will contact the clinic if she needs a refill 1. Restart venlafaxine 225 mg daily 2. After a week of starting venlafaxine, Restart bupropion 300 mg daily  3. Hold Abilify (not taking 15 mg daily for the past two months) 4. Next appointment:5/2 at 8:20 for 30  mins, video -ontemazepam 30 mg at night as needed for sleep -on gabapentin for pain and hydroxyzine  Past trials of medication: fluoxetine,duloxetine,buspirone, Trazodone ("weird" feeling) temazepam, Xanax  The patient demonstrates the following risk factors for suicide: Chronic risk factors for suicide include: psychiatric disorder of depression, anxietyand chronic pain. Acute risk factorsfor suicide include: N/A. Protective factorsfor this patient include: positive social support,  responsibility to others (children, family), coping skills and hope for the future. Considering these factors, the overall suicide risk at this point appears to be low. Patient isappropriate for outpatient follow up.    [name], MD 11/08/2020, 8:44 AM

## 2020-11-03 ENCOUNTER — Telehealth (INDEPENDENT_AMBULATORY_CARE_PROVIDER_SITE_OTHER): Payer: Self-pay

## 2020-11-03 ENCOUNTER — Ambulatory Visit (INDEPENDENT_AMBULATORY_CARE_PROVIDER_SITE_OTHER): Payer: No Typology Code available for payment source | Admitting: Gastroenterology

## 2020-11-03 ENCOUNTER — Encounter (INDEPENDENT_AMBULATORY_CARE_PROVIDER_SITE_OTHER): Payer: Self-pay | Admitting: Gastroenterology

## 2020-11-03 ENCOUNTER — Other Ambulatory Visit (HOSPITAL_COMMUNITY): Payer: Self-pay

## 2020-11-03 ENCOUNTER — Other Ambulatory Visit: Payer: Self-pay

## 2020-11-03 ENCOUNTER — Encounter (INDEPENDENT_AMBULATORY_CARE_PROVIDER_SITE_OTHER): Payer: Self-pay

## 2020-11-03 ENCOUNTER — Other Ambulatory Visit (INDEPENDENT_AMBULATORY_CARE_PROVIDER_SITE_OTHER): Payer: Self-pay

## 2020-11-03 VITALS — BP 135/82 | HR 83 | Temp 98.3°F | Ht 65.0 in | Wt 197.0 lb

## 2020-11-03 DIAGNOSIS — Z800 Family history of malignant neoplasm of digestive organs: Secondary | ICD-10-CM

## 2020-11-03 DIAGNOSIS — K5903 Drug induced constipation: Secondary | ICD-10-CM | POA: Diagnosis not present

## 2020-11-03 DIAGNOSIS — K5900 Constipation, unspecified: Secondary | ICD-10-CM | POA: Insufficient documentation

## 2020-11-03 MED ORDER — MAGNESIUM CITRATE PO SOLN
1.0000 | Freq: Once | ORAL | 0 refills | Status: DC
  Filled 2020-11-03: qty 195, fill #0

## 2020-11-03 MED ORDER — MAGNESIUM CITRATE PO SOLN: 1.0000 | Freq: Once | ORAL | 0 refills | Status: AC

## 2020-11-03 MED ORDER — PEG 3350-KCL-NA BICARB-NACL 420 G PO SOLR: 4000.0000 mL | ORAL | 0 refills | Status: DC

## 2020-11-03 NOTE — Progress Notes (Signed)
Maylon Peppers, M.D. Gastroenterology & Hepatology [facility] [address] [place], [place] [address] Primary Care Physician: Fayrene Helper, MD [address] [place] [address]  Referring MD: PCP  Chief Complaint:  constipation  History of Present Illness: CAITLYN BUCHANAN is a 50 y.o. female with PMH DM, HTN, anxiety, sickle cell trait, who presents for evaluation of constipation.  Patient reports that for the last couple of years she has had intermittent constipation. She has a BM every 3-4 days, sometimes the stool is very hard. She has to strain significantly to have a BM.  Does not take any laxatives. Last TSH 1.4 on 08/2020, also had normnal electrolyes in CMP. She takes hydrocodone as needed at night for back pain.  The patient denies having any nausea, vomiting, fever, chills, hematochezia, melena, hematemesis, abdominal distention, abdominal pain, diarrhea, jaundice, pruritus.  Has lost close to 10 lb after changing her diet, which was intentional.  Last GLO:VFIEP Last Colonoscopy:never  FHx: neg for any gastrointestinal/liver disease, mother colon cancer when she was in her 50s Social: neg smoking, alcohol or illicit drug use Surgical: no abdominal surgeries  Past Medical History: Past Medical History:  Diagnosis Date  . Absolute anemia 06/23/2018   Added automatically from request for surgery [id number]  . Anemia   . Anxiety   . Backache 12/24/2007  . Chronic back pain   . Diabetes mellitus, type II ([place])   . Headache(784.0)   . Hypertension   . Metabolic syndrome X 8/41/6606  . Obesity   . Palpitation   . Sickle cell trait ([place])   . Thyromegaly   . Unspecified vitamin D deficiency 02/19/2013    Past Surgical History: Past Surgical History:  Procedure Laterality Date  . DILATATION AND CURETTAGE/HYSTEROSCOPY WITH MINERVA N/A 03/25/2019   Procedure: DILATATION AND CURETTAGE/HYSTEROSCOPY  WITH  ATTEMPTED MINERVA ENDOMETRIAL ABLATION;  Surgeon: Florian Buff, MD;  Location: [facility];  Service: Gynecology;  Laterality: N/A;  . None      Family History: Family History  Problem Relation Age of Onset  . Diabetes Mother   . Hypertension Mother   . Stroke Mother   . Colon cancer Mother   . Alcohol abuse Mother   . Cancer - Colon Mother 69  . Pneumonia Father   . Hypertension Sister   . Anxiety disorder Sister   . Hypertension Sister   . Leukemia Sister   . Diabetes Maternal Grandmother   . Hypertension Maternal Grandmother   . Hypertension Maternal Grandfather   . Heart attack Maternal Uncle     Social History: Social History   Tobacco Use  Smoking Status Never Smoker  Smokeless Tobacco Never Used   Social History   Substance and Sexual Activity  Alcohol Use No   Social History   Substance and Sexual Activity  Drug Use No    Allergies: No Known Allergies  Medications: Current Outpatient Medications  Medication Sig Dispense Refill  . ARIPiprazole (ABILIFY) 15 MG tablet Take 1 tablet (15 mg total) by mouth daily. 30 tablet 1  . aspirin EC 81 MG tablet Take 1 tablet (81 mg total) by mouth daily. 90 tablet 3  . blood glucose meter kit and supplies Dispense based on patient and insurance preference. Once daily testing dx e11.9 1 each 0  . buPROPion (WELLBUTRIN XL) 150 MG 24 hr tablet Total of 450 mg daily. Take along with 300 mg tab 30 tablet 1  .  buPROPion (WELLBUTRIN XL) 300 MG 24 hr tablet Take 1 tablet (300 mg total) by mouth daily. 90 tablet 0  . ergocalciferol (VITAMIN D2) 1.25 MG (50000 UT) capsule Take 1 capsule (50,000 Units total) by mouth once a week. One capsule once weekly 12 capsule 2  . glipiZIDE (GLUCOTROL XL) 10 MG 24 hr tablet TAKE TWO TABLETS BY MOUTH ONCE DAILY WITH BREAKFAST 180 tablet 1  . megestrol (MEGACE) 40 MG tablet TAKE 3 TABLETS BY MOUTH ONCE DAILY FOR 5 DAYS, THEN TAKE 2 TABLETS ONCE DAILY FOR 5 DAYS, THEN TAKE 1 TABLET ONCE  DAILY. (Patient taking differently: Take 40 mg by mouth daily.) 45 tablet 5  . metoprolol tartrate (LOPRESSOR) 25 MG tablet TAKE 1 TABLET BY MOUTH DAILY. 90 tablet 0  . SitaGLIPtin-MetFORMIN HCl [id number] MG TB24 TAKE ONE TABLET BY MOUTH ONCE EVERY DAY 30 tablet 5  . temazepam (RESTORIL) 30 MG capsule Take 1 capsule (30 mg total) by mouth at bedtime as needed for sleep. 30 capsule 3  . triamterene-hydrochlorothiazide (MAXZIDE-25) 37.5-25 MG tablet TAKE 1 TABLET BY MOUTH DAILY. 90 tablet 3  . venlafaxine XR (EFFEXOR-XR) 150 MG 24 hr capsule 225 mg daily (150 mg + 75 mg) 90 capsule 0  . venlafaxine XR (EFFEXOR-XR) 75 MG 24 hr capsule 225 mg daily (150 mg + 75 mg) 90 capsule 0  . Vitamin D, Ergocalciferol, (DRISDOL) 1.25 MG (50000 UNIT) CAPS capsule TAKE 1 CAPSULE (50,000 UNITS TOTAL) BY MOUTH ONCE A WEEK. 12 capsule 2  . rosuvastatin (CRESTOR) 5 MG tablet Take 1 tablet (5 mg total) by mouth daily. 90 tablet 3   No current facility-administered medications for this visit.    Review of Systems: GENERAL: negative for malaise, night sweats HEENT: No changes in hearing or vision, no nose bleeds or other nasal problems. NECK: Negative for lumps, goiter, pain and significant neck swelling RESPIRATORY: Negative for cough, wheezing CARDIOVASCULAR: Negative for chest pain, leg swelling, palpitations, orthopnea GI: SEE HPI MUSCULOSKELETAL: Negative for joint pain or swelling, back pain, and muscle pain. SKIN: Negative for lesions, rash PSYCH: Negative for sleep disturbance, mood disorder and recent psychosocial stressors. HEMATOLOGY Negative for prolonged bleeding, bruising easily, and swollen nodes. ENDOCRINE: Negative for cold or heat intolerance, polyuria, polydipsia and goiter. NEURO: negative for tremor, gait imbalance, syncope and seizures. The remainder of the review of systems is noncontributory.   Physical Exam: BP 135/82 (BP Location: Left Arm, Patient Position: Sitting, Cuff Size: Large)    Pulse 83   Temp 98.3 F (36.8 C) (Oral)   Ht 5' 5"  (1.651 m)   Wt 197 lb (89.4 kg)   BMI 32.78 kg/m  GENERAL: The patient is AO x3, in no acute distress. Obese, HEENT: Head is normocephalic and atraumatic. EOMI are intact. Mouth is well hydrated and without lesions. NECK: Supple. No masses LUNGS: Clear to auscultation. No presence of rhonchi/wheezing/rales. Adequate chest expansion HEART: RRR, normal s1 and s2. ABDOMEN: Soft, nontender, no guarding, no peritoneal signs, and nondistended. BS +. No masses. EXTREMITIES: Without any cyanosis, clubbing, rash, lesions or edema. NEUROLOGIC: AOx3, no focal motor deficit. SKIN: no jaundice, no rashes  Imaging/Labs: as above  I personally reviewed and interpreted the available labs, imaging and endoscopic files.  Impression and Plan: JOHNNY LATU is a 49 y.o. female with PMH DM, HTN, anxiety, sickle cell trait, who presents for evaluation of constipation. Patient has had persistent constipation without red flag signs. It is very possible that opiates are worsening her constipation. Had  normal thyroid function and electrolytes recently. I explained her she can improve her BM frequency by adding fiber in diet but also can have major improvement with the use of Miralax. She has family history of colon cancer, for which she is overdue for CRC screening - due to this she will be scheduled for a colonoscopy. If normal, she can remain in a 10 year interval (mother diagnosed at age 37).  -Schedule  Colonoscopy - Start taking Miralax 1 cap every day for one week. If bowel movements do not improve, increase to 1 cup every 12 hours. If after two weeks there is no improvement, increase to 1 cup every 8 hours - Eat prune and/or kiwi daily  All questions were answered.      Maylon Peppers, MD Gastroenterology and Hepatology [facility]

## 2020-11-03 NOTE — Patient Instructions (Signed)
Schedule  Colonoscopy Start taking Miralax 1 cap every day for one week. If bowel movements do not improve, increase to 1 cup every 12 hours. If after two weeks there is no improvement, increase to 1 cup every 8 hours Eat prune and/or kiwi daily

## 2020-11-03 NOTE — Telephone Encounter (Signed)
Mary Roman, CMA  

## 2020-11-08 ENCOUNTER — Encounter: Payer: Self-pay | Admitting: Psychiatry

## 2020-11-08 ENCOUNTER — Other Ambulatory Visit: Payer: Self-pay

## 2020-11-08 ENCOUNTER — Telehealth (INDEPENDENT_AMBULATORY_CARE_PROVIDER_SITE_OTHER): Payer: No Typology Code available for payment source | Admitting: Psychiatry

## 2020-11-08 ENCOUNTER — Other Ambulatory Visit (HOSPITAL_COMMUNITY): Payer: Self-pay

## 2020-11-08 DIAGNOSIS — F411 Generalized anxiety disorder: Secondary | ICD-10-CM | POA: Diagnosis not present

## 2020-11-08 DIAGNOSIS — F331 Major depressive disorder, recurrent, moderate: Secondary | ICD-10-CM

## 2020-11-08 NOTE — Patient Instructions (Signed)
1. Restart venlafaxine 225 mg daily 2. After a week of starting venlafaxine, Restart bupropion 300 mg daily  3. Hold Abilify  4. Next appointment:5/2 at 8:20

## 2020-11-09 ENCOUNTER — Other Ambulatory Visit (HOSPITAL_COMMUNITY): Payer: Self-pay

## 2020-11-09 ENCOUNTER — Telehealth: Payer: Self-pay

## 2020-11-09 ENCOUNTER — Other Ambulatory Visit: Payer: Self-pay | Admitting: Psychiatry

## 2020-11-09 DIAGNOSIS — F3342 Major depressive disorder, recurrent, in full remission: Secondary | ICD-10-CM

## 2020-11-09 DIAGNOSIS — F331 Major depressive disorder, recurrent, moderate: Secondary | ICD-10-CM

## 2020-11-09 MED ORDER — VENLAFAXINE HCL ER 150 MG PO CP24
150.0000 mg | ORAL_CAPSULE | Freq: Every day | ORAL | 0 refills | Status: DC
  Filled 2020-11-09: qty 90, 90d supply, fill #0

## 2020-11-09 MED ORDER — VENLAFAXINE HCL ER 75 MG PO CP24
75.0000 mg | ORAL_CAPSULE | Freq: Every day | ORAL | 0 refills | Status: DC
  Filled 2020-11-09: qty 90, 90d supply, fill #0

## 2020-11-09 MED ORDER — BUPROPION HCL ER (XL) 300 MG PO TB24
300.0000 mg | ORAL_TABLET | Freq: Every day | ORAL | 0 refills | Status: DC
  Filled 2020-11-09: qty 90, 90d supply, fill #0

## 2020-11-09 NOTE — Telephone Encounter (Signed)
Ordered

## 2020-11-09 NOTE — Telephone Encounter (Signed)
pt called states she needs a refill on all her medications please send to [facility]

## 2020-11-10 ENCOUNTER — Other Ambulatory Visit (HOSPITAL_COMMUNITY): Payer: Self-pay

## 2020-11-11 ENCOUNTER — Other Ambulatory Visit (HOSPITAL_COMMUNITY): Payer: Self-pay

## 2020-11-15 ENCOUNTER — Other Ambulatory Visit (HOSPITAL_COMMUNITY): Payer: Self-pay

## 2020-11-15 ENCOUNTER — Ambulatory Visit (INDEPENDENT_AMBULATORY_CARE_PROVIDER_SITE_OTHER): Payer: No Typology Code available for payment source | Admitting: Family Medicine

## 2020-11-15 ENCOUNTER — Other Ambulatory Visit: Payer: Self-pay

## 2020-11-15 ENCOUNTER — Encounter: Payer: Self-pay | Admitting: Family Medicine

## 2020-11-15 VITALS — BP 121/84 | HR 69 | Ht 65.0 in | Wt 189.4 lb

## 2020-11-15 DIAGNOSIS — E785 Hyperlipidemia, unspecified: Secondary | ICD-10-CM

## 2020-11-15 DIAGNOSIS — I10 Essential (primary) hypertension: Secondary | ICD-10-CM

## 2020-11-15 DIAGNOSIS — F331 Major depressive disorder, recurrent, moderate: Secondary | ICD-10-CM | POA: Diagnosis not present

## 2020-11-15 DIAGNOSIS — E669 Obesity, unspecified: Secondary | ICD-10-CM

## 2020-11-15 DIAGNOSIS — E1159 Type 2 diabetes mellitus with other circulatory complications: Secondary | ICD-10-CM

## 2020-11-15 DIAGNOSIS — G8929 Other chronic pain: Secondary | ICD-10-CM

## 2020-11-15 LAB — GLUCOSE, POCT (MANUAL RESULT ENTRY): POC Glucose: 190 mg/dl — AB (ref 70–99)

## 2020-11-15 MED ORDER — SITAGLIP PHOS-METFORMIN HCL ER 100-1000 MG PO TB24
1.0000 | ORAL_TABLET | Freq: Every day | ORAL | 0 refills | Status: DC
  Filled 2020-11-15: qty 30, 30d supply, fill #0
  Filled 2021-01-05: qty 30, 30d supply, fill #1
  Filled 2021-03-01: qty 30, 30d supply, fill #2

## 2020-11-15 MED ORDER — TEMAZEPAM 30 MG PO CAPS
30.0000 mg | ORAL_CAPSULE | Freq: Every day | ORAL | 1 refills | Status: DC
  Filled 2020-11-15: qty 90, 90d supply, fill #0

## 2020-11-15 NOTE — Assessment & Plan Note (Signed)
Needs to increase exercise

## 2020-11-15 NOTE — Addendum Note (Signed)
Addended by: Eual Fines on: 11/15/2020 12:02 PM   Modules accepted: Orders

## 2020-11-15 NOTE — Assessment & Plan Note (Signed)
  Patient re-educated about  the importance of commitment to a  minimum of 150 minutes of exercise per week as able.  The importance of healthy food choices with portion control discussed, as well as eating regularly and within a 12 hour window most days. The need to choose "clean , green" food 50 to 75% of the time is discussed, as well as to make water the primary drink and set a goal of 64 ounces water daily.    Weight /BMI 11/15/2020 11/03/2020 10/21/2020  WEIGHT 189 lb 6.4 oz 197 lb 200 lb  HEIGHT 5\' 5"  5\' 5"  5\' 5"   BMI 31.52 kg/m2 32.78 kg/m2 33.28 kg/m2  Some encounter information is confidential and restricted. Go to Review Flowsheets activity to see all data.

## 2020-11-15 NOTE — Assessment & Plan Note (Signed)
Controlled, no change in medication DASH diet and commitment to daily physical activity for a minimum of 30 minutes discussed and encouraged, as a part of hypertension management. The importance of attaining a healthy weight is also discussed.  BP/Weight 11/15/2020 11/03/2020 10/21/2020 08/31/2020 06/28/2020 04/19/2020 6/39/4320  Systolic BP 037 944 - - 461 901 222  Diastolic BP 84 82 - - 93 88 84  Wt. (Lbs) 189.4 197 200 208 206 202 211  BMI 31.52 32.78 33.28 34.61 34.28 33.61 35.11  Some encounter information is confidential and restricted. Go to Review Flowsheets activity to see all data.

## 2020-11-15 NOTE — Patient Instructions (Addendum)
F/U in 4 weeks, call if you need me before  Non fasting HBA1C, chem 7 and eGFr , microalb on May 4, I will send a message once I see it  It is important that you exercise regularly at least 30 minutes 5 times a week. If you develop chest pain, have severe difficulty breathing, or feel very tired, stop exercising immediately and seek medical attention    Think about what you will eat, plan ahead. Choose " clean, green, fresh or frozen" over canned, processed or packaged foods which are more sugary, salty and fatty. 70 to 75% of food eaten should be vegetables and fruit. Three meals at set times with snacks allowed between meals, but they must be fruit or vegetables. Aim to eat over a 12 hour period , example 7 am to 7 pm, and STOP after  your last meal of the day. Drink water,generally about 64 ounces per day, no other drink is as healthy. Fruit juice is best enjoyed in a healthy way, by EATING the fruit. Best with colonoscopy

## 2020-11-15 NOTE — Assessment & Plan Note (Signed)
Uncontrolled, needs increased med rep[orts morning sugars between 160 to 180, will check in office Ms. Dannenberg is reminded of the importance of commitment to daily physical activity for 30 minutes or more, as able and the need to limit carbohydrate intake to 30 to 60 grams per meal to help with blood sugar control.   The need to take medication as prescribed, test blood sugar as directed, and to call between visits if there is a concern that blood sugar is uncontrolled is also discussed.   Ms. Un is reminded of the importance of daily foot exam, annual eye examination, and good blood sugar, blood pressure and cholesterol control.  Diabetic Labs Latest Ref Rng & Units 09/01/2020 04/18/2020 01/13/2020 08/13/2019 08/06/2019  HbA1c 4.8 - 5.6 % 10.4(H) 8.6(H) 8.8(H) 9.9(H) -  Microalbumin Not Estab. ug/mL - - - - -  Micro/Creat Ratio 0 - 29 mg/g creat - - - - -  Chol 100 - 199 mg/dL 122 126 105 - -  HDL >39 mg/dL 25(L) 30(L) 26(L) - -  Calc LDL 0 - 99 mg/dL 80 84 65 - -  Triglycerides 0 - 149 mg/dL 88 59 70 - -  Creatinine 0.57 - 1.00 mg/dL 1.31(H) 0.97 1.21(H) - 0.94   BP/Weight 11/15/2020 11/03/2020 10/21/2020 08/31/2020 06/28/2020 04/19/2020 04/22/2682  Systolic BP 419 622 - - 297 989 211  Diastolic BP 84 82 - - 93 88 84  Wt. (Lbs) 189.4 197 200 208 206 202 211  BMI 31.52 32.78 33.28 34.61 34.28 33.61 35.11  Some encounter information is confidential and restricted. Go to Review Flowsheets activity to see all data.   Foot/eye exam completion dates Latest Ref Rng & Units 04/19/2020 02/03/2019  Eye Exam No Retinopathy - -  Foot Form Completion - Done Done

## 2020-11-15 NOTE — Assessment & Plan Note (Signed)
Improving, managed by Psych

## 2020-11-15 NOTE — Assessment & Plan Note (Signed)
The patient's Controlled Substance registry is reviewed and compliance confirmed. Adequacy of  Pain control and level of function is assessed. Medication dosing is adjusted as deemed appropriate. Twelve weeks of medication is prescribed , with a follow up appointment between 11 to 12 weeks .  

## 2020-11-15 NOTE — Addendum Note (Signed)
Addended by: Laretta Bolster on: 11/15/2020 08:47 AM   Modules accepted: Orders

## 2020-11-15 NOTE — Progress Notes (Signed)
Mary Roman     MRN: [id number]      DOB: 10/26/1970   HPI Mary Roman is here for follow up and re-evaluation of chronic medical conditions, medication management and review of any available recent lab and radiology data.  Preventive health is updated, specifically  Cancer screening and Immunization.   Questions or concerns regarding consultations or procedures which the PT has had in the interim are  addressed. The PT denies any adverse reactions to current medications since the last visit.  There are no new concerns.  There are no specific complaints   ROS Denies recent fever or chills. Denies sinus pressure, nasal congestion, ear pain or sore throat. Denies chest congestion, productive cough or wheezing. Denies chest pains, palpitations and leg swelling Denies abdominal pain, nausea, vomiting,diarrhea or constipation.   Denies dysuria, frequency, hesitancy or incontinence. Denies uncontrolled oint pain, swelling and limitation in mobility. Denies headaches, seizures, numbness, or tingling. Denies uncontrolled  depression, anxiety or insomnia. Denies skin break down or rash.   PE  BP 121/84   Pulse 69   Ht 5\' 5"  (1.651 m)   Wt 189 lb 6.4 oz (85.9 kg)   SpO2 97%   BMI 31.52 kg/m   Patient alert and oriented and in no cardiopulmonary distress.  HEENT: No facial asymmetry, EOMI,     Neck supple .  Chest: Clear to auscultation bilaterally.  CVS: S1, S2 no murmurs, no S3.Regular rate.  ABD: Soft non tender.   Ext: No edema  MS: Adequate ROM spine, shoulders, hips and knees.  Skin: Intact, no ulcerations or rash noted.  Psych: Good eye contact, normal affect. Memory intact not anxious or depressed appearing.  CNS: CN 2-12 intact, power,  normal throughout.no focal deficits noted.   Assessment & Plan  Essential hypertension Controlled, no change in medication DASH diet and commitment to daily physical activity for a minimum of 30 minutes discussed  and encouraged, as a part of hypertension management. The importance of attaining a healthy weight is also discussed.  BP/Weight 11/15/2020 11/03/2020 10/21/2020 08/31/2020 06/28/2020 04/19/2020 4/62/7035  Systolic BP 009 381 - - 829 937 169  Diastolic BP 84 82 - - 93 88 84  Wt. (Lbs) 189.4 197 200 208 206 202 211  BMI 31.52 32.78 33.28 34.61 34.28 33.61 35.11  Some encounter information is confidential and restricted. Go to Review Flowsheets activity to see all data.       Type 2 diabetes mellitus with vascular disease (HCC) Uncontrolled, needs increased med rep[orts morning sugars between 160 to 180, will check in office Mary Roman is reminded of the importance of commitment to daily physical activity for 30 minutes or more, as able and the need to limit carbohydrate intake to 30 to 60 grams per meal to help with blood sugar control.   The need to take medication as prescribed, test blood sugar as directed, and to call between visits if there is a concern that blood sugar is uncontrolled is also discussed.   Mary Roman is reminded of the importance of daily foot exam, annual eye examination, and good blood sugar, blood pressure and cholesterol control.  Diabetic Labs Latest Ref Rng & Units 09/01/2020 04/18/2020 01/13/2020 08/13/2019 08/06/2019  HbA1c 4.8 - 5.6 % 10.4(H) 8.6(H) 8.8(H) 9.9(H) -  Microalbumin Not Estab. ug/mL - - - - -  Micro/Creat Ratio 0 - 29 mg/g creat - - - - -  Chol 100 - 199 mg/dL 122 126 105 - -  HDL >39 mg/dL 25(L) 30(L) 26(L) - -  Calc LDL 0 - 99 mg/dL 80 84 65 - -  Triglycerides 0 - 149 mg/dL 88 59 70 - -  Creatinine 0.57 - 1.00 mg/dL 1.31(H) 0.97 1.21(H) - 0.94   BP/Weight 11/15/2020 11/03/2020 10/21/2020 08/31/2020 06/28/2020 04/19/2020 6/60/6004  Systolic BP 599 774 - - 142 395 320  Diastolic BP 84 82 - - 93 88 84  Wt. (Lbs) 189.4 197 200 208 206 202 211  BMI 31.52 32.78 33.28 34.61 34.28 33.61 35.11  Some encounter information is confidential and restricted. Go  to Review Flowsheets activity to see all data.   Foot/eye exam completion dates Latest Ref Rng & Units 04/19/2020 02/03/2019  Eye Exam No Retinopathy - -  Foot Form Completion - Done Done         Encounter for chronic pain management The patient's Controlled Substance registry is reviewed and compliance confirmed. Adequacy of  Pain control and level of function is assessed. Medication dosing is adjusted as deemed appropriate. Twelve weeks of medication is prescribed , with a follow up appointment between 11 to 12 weeks .   Major depressive disorder, recurrent episode, moderate (HCC) Improving, managed by Psych  Obesity (BMI 30.0-34.9)  Patient re-educated about  the importance of commitment to a  minimum of 150 minutes of exercise per week as able.  The importance of healthy food choices with portion control discussed, as well as eating regularly and within a 12 hour window most days. The need to choose "clean , green" food 50 to 75% of the time is discussed, as well as to make water the primary drink and set a goal of 64 ounces water daily.    Weight /BMI 11/15/2020 11/03/2020 10/21/2020  WEIGHT 189 lb 6.4 oz 197 lb 200 lb  HEIGHT 5\' 5"  5\' 5"  5\' 5"   BMI 31.52 kg/m2 32.78 kg/m2 33.28 kg/m2  Some encounter information is confidential and restricted. Go to Review Flowsheets activity to see all data.      Dyslipidemia (high LDL; low HDL) Needs to increase exercise

## 2020-11-21 NOTE — Progress Notes (Signed)
Virtual Visit via Video Note  I connected with Mary Roman on 11/28/20 at  8:20 AM EDT by a video enabled telemedicine application and verified that I am speaking with the correct person using two identifiers.  Location: Patient: outside Provider: office Persons participated in the visit- patient, provider   I discussed the limitations of evaluation and management by telemedicine and the availability of in person appointments. The patient expressed understanding and agreed to proceed.    I discussed the assessment and treatment plan with the patient. The patient was provided an opportunity to ask questions and all were answered. The patient agreed with the plan and demonstrated an understanding of the instructions.   The patient was advised to call back or seek an in-person evaluation if the symptoms worsen or if the condition fails to improve as anticipated.  I provided 12 minutes of non-face-to-face time during this encounter.   [name], MD    [facility] MD/PA/NP OP Progress Note  11/28/2020 8:39 AM Mary Roman  MRN:  [id number]  Chief Complaint:  Chief Complaint    Follow-up; Depression     HPI:  This is a follow-up appointment for depression.  She states that although she felt a little nauseated when she restarted her medication, she denies any side effect lately.  She feels less anxious, and feels better.  She went out for a mall with her daughter last weekend; she enjoyed the time.  She took a walk a few times per week for about 10 minutes.  She is willing to do it consistently, and more frequently.  Although there were a few days she did not want to go to work, she was able to make herself go there.  She does not want to have any interaction with anybody at work at times.  She states that the relationship with her fianc has been getting better.  He is employed.  She has depressive symptoms as in PHQ-9.  She denies panic attacks.  She agrees to stay on the current  medication at this time.   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 one son, one at age 22 yo  Visit Diagnosis:    ICD-10-CM   1. MDD (major depressive disorder), recurrent episode, mild ([place])  F33.0   2. Anxiety state  F41.1   3. Insomnia, unspecified type  G47.00     Past Psychiatric History: Please see initial evaluation for full details. I have reviewed the history. No updates at this time.     Past Medical History:  Past Medical History:  Diagnosis Date  . Absolute anemia 06/23/2018   Added automatically from request for surgery [id number]  . Anemia   . Anxiety   . Backache 12/24/2007  . Chronic back pain   . Diabetes mellitus, type II ([place])   . Headache(784.0)   . Hypertension   . Metabolic syndrome X 6/50/3546  . Obesity   . Palpitation   . Sickle cell trait ([place])   . Thyromegaly   . Unspecified vitamin D deficiency 02/19/2013    Past Surgical History:  Procedure Laterality Date  . DILATATION AND CURETTAGE/HYSTEROSCOPY WITH MINERVA N/A 03/25/2019   Procedure: DILATATION AND CURETTAGE/HYSTEROSCOPY WITH  ATTEMPTED MINERVA ENDOMETRIAL ABLATION;  Surgeon: Florian Buff, MD;  Location: [facility];  Service: Gynecology;  Laterality: N/A;  . None      Family Psychiatric History: Please see initial evaluation for full details. I have reviewed  the history. No updates at this time.     Family History:  Family History  Problem Relation Age of Onset  . Diabetes Mother   . Hypertension Mother   . Stroke Mother   . Colon cancer Mother   . Alcohol abuse Mother   . Cancer - Colon Mother 46  . Pneumonia Father   . Hypertension Sister   . Anxiety disorder Sister   . Hypertension Sister   . Leukemia Sister   . Diabetes Maternal Grandmother   . Hypertension Maternal Grandmother   . Hypertension Maternal Grandfather   . Heart attack Maternal Uncle      Social History:  Social History   Socioeconomic History  . Marital status: Single    Spouse name: Not on file  . Number of children: 2  . Years of education: Not on file  . Highest education level: Not on file  Occupational History  . Occupation: employed in medical office   Tobacco Use  . Smoking status: Never Smoker  . Smokeless tobacco: Never Used  Vaping Use  . Vaping Use: Never used  Substance and Sexual Activity  . Alcohol use: No  . Drug use: No  . Sexual activity: Yes    Partners: Male    Birth control/protection: Other-see comments    Comment: bf had vasectomy  Other Topics Concern  . Not on file  Social History Narrative  . Not on file   Social Determinants of Health   Financial Resource Strain: Not on file  Food Insecurity: Not on file  Transportation Needs: Not on file  Physical Activity: Not on file  Stress: Not on file  Social Connections: Not on file    Allergies: No Known Allergies  Metabolic Disorder Labs: Lab Results  Component Value Date   HGBA1C 10.4 (H) 09/01/2020   MPG 200.12 04/18/2020   MPG 206 01/13/2020   No results found for: PROLACTIN Lab Results  Component Value Date   CHOL 122 09/01/2020   TRIG 88 09/01/2020   HDL 25 (L) 09/01/2020   CHOLHDL 4.9 (H) 09/01/2020   VLDL 12 04/18/2020   LDLCALC 80 09/01/2020   LDLCALC 84 04/18/2020   Lab Results  Component Value Date   TSH 1.400 09/01/2020   TSH 1.916 05/13/2019    Therapeutic Level Labs: No results found for: LITHIUM No results found for: VALPROATE No components found for:  CBMZ  Current Medications: Current Outpatient Medications  Medication Sig Dispense Refill  . aspirin EC 81 MG tablet Take 1 tablet (81 mg total) by mouth daily. 90 tablet 3  . blood glucose meter kit and supplies Dispense based on patient and insurance preference. Once daily testing dx e11.9 1 each 0  . buPROPion (WELLBUTRIN XL) 300 MG 24 hr tablet Take 1 tablet (300 mg total) by mouth  daily. 90 tablet 0  . ergocalciferol (VITAMIN D2) 1.25 MG (50000 UT) capsule Take 1 capsule (50,000 Units total) by mouth once a week. One capsule once weekly 12 capsule 2  . glipiZIDE (GLUCOTROL XL) 10 MG 24 hr tablet TAKE TWO TABLETS BY MOUTH ONCE DAILY WITH BREAKFAST 180 tablet 1  . megestrol (MEGACE) 40 MG tablet TAKE 3 TABLETS BY MOUTH ONCE DAILY FOR 5 DAYS, THEN TAKE 2 TABLETS ONCE DAILY FOR 5 DAYS, THEN TAKE 1 TABLET ONCE DAILY. (Patient taking differently: Take 40 mg by mouth daily.) 45 tablet 5  . metoprolol tartrate (LOPRESSOR) 25 MG tablet TAKE 1 TABLET BY MOUTH DAILY. 90 tablet 0  .  polyethylene glycol-electrolytes (TRILYTE) 420 g solution Take 4,000 mLs by mouth as directed. 4000 mL 0  . rosuvastatin (CRESTOR) 5 MG tablet Take 1 tablet (5 mg total) by mouth daily. 90 tablet 3  . SitaGLIPtin-MetFORMIN HCl [phone] MG TB24 TAKE ONE TABLET BY MOUTH ONCE EVERY DAY 30 tablet 5  . SitaGLIPtin-MetFORMIN HCl [phone] MG TB24 Take 1 tablet by mouth daily. 90 tablet 0  . temazepam (RESTORIL) 30 MG capsule Take 1 capsule (30 mg total) by mouth at bedtime as needed for sleep. 30 capsule 3  . temazepam (RESTORIL) 30 MG capsule Take 1 capsule (30 mg total) by mouth at bedtime. 90 capsule 1  . triamterene-hydrochlorothiazide (MAXZIDE-25) 37.5-25 MG tablet TAKE 1 TABLET BY MOUTH DAILY. 90 tablet 3  . venlafaxine XR (EFFEXOR-XR) 150 MG 24 hr capsule Take 1  150 mg capsule along with 1  57m capsule daily 90 capsule 0  . venlafaxine XR (EFFEXOR-XR) 75 MG 24 hr capsule Take 1  754mcapsule by mouth along with 1 15061mapsule daily 90 capsule 0  . Vitamin D, Ergocalciferol, (DRISDOL) 1.25 MG (50000 UNIT) CAPS capsule TAKE 1 CAPSULE (50,000 UNITS TOTAL) BY MOUTH ONCE A WEEK. 12 capsule 2   No current facility-administered medications for this visit.     Musculoskeletal: Strength & Muscle Tone: N/A Gait & Station: N/A Patient leans: N/A  Psychiatric Specialty Exam: Review of Systems   Psychiatric/Behavioral: Positive for decreased concentration and dysphoric mood. Negative for agitation, behavioral problems, confusion, hallucinations, self-injury, sleep disturbance and suicidal ideas. The patient is not nervous/anxious and is not hyperactive.   All other systems reviewed and are negative.   There were no vitals taken for this visit.There is no height or weight on file to calculate BMI.  General Appearance: Fairly Groomed  Eye Contact:  Good  Speech:  Clear and Coherent  Volume:  Normal  Mood:  better  Affect:  Appropriate, Congruent and less down  Thought Process:  Coherent  Orientation:  Full (Time, Place, and Person)  Thought Content: Logical   Suicidal Thoughts:  No  Homicidal Thoughts:  No  Memory:  Immediate;   Good  Judgement:  Good  Insight:  Good  Psychomotor Activity:  Normal  Concentration:  Concentration: Good and Attention Span: Good  Recall:  Good  Fund of Knowledge: Good  Language: Good  Akathisia:  No  Handed:  Right  AIMS (if indicated): not done  Assets:  Communication Skills Desire for Improvement  ADL's:  Intact  Cognition: WNL  Sleep:  Fair   Screenings: GAD-7   Flowsheet Row Counsel[facility]  Total GAD-7 Score 17 14    PHQ2-9   Flowsheet Row [facility]2020 [facility] Video Visit [facility]31/2020 [facility] Video Visit from 03/23/2020 [facility]  PHQ-2 Total Score 2 2 0 0 2  PHQ-9 Total Score 6 9 -- -- 6    Flowsheet Row Video Visit [facility] 11/28/2020 in [facility] from [facility]/08/2020 in [facility] Risk No  Risk       Assessment and Plan:  Mary Roman a 49 6[age]. year old Roman with a history of  depression, diabetes , who presents for follow up  appointment for below.   1. MDD (major depressive disorder), recurrent episode, mild ([place]) 2. Anxiety state There has been overall improvement in depressive symptoms and anxiety since restarting her medication.  Will continue current medication regimen; venlafaxine and bupropion with the hope that medication will exert its full effect for depression and anxiety.  Coached behavioral activation.  She agrees to take a walk consistently, and more frequently for the next month.   3. Insomnia, unspecified type She continues to have prominent fatigue,middle insomnia and snoring.  She was referred for sleep evaluation.    She is planning to contact the clinic after her work schedule is settled.   Plan 1. Continue venlafaxine 225 mg daily 2. Continue bupropion 300 mg daily  3. Next appointment:6/3 at 8:20 for 30  mins, video -ontemazepam 30 mg at night as needed for sleep -on gabapentin for pain and hydroxyzine  Past trials of medication: fluoxetine,duloxetine, Abilify (may consider restarting this), buspirone, Trazodone ("weird" feeling) temazepam, Xanax  The patient demonstrates the following risk factors for suicide: Chronic risk factors for suicide include: psychiatric disorder of depression, anxietyand chronic pain. Acute risk factorsfor suicide include: N/A. Protective factorsfor this patient include: positive social support, responsibility to others (children, family), coping skills and hope for the future. Considering these factors, the overall suicide risk at this point appears to be low. Patient isappropriate for outpatient follow up.  [name], MD 11/28/2020, 8:39 AM

## 2020-11-28 ENCOUNTER — Encounter: Payer: Self-pay | Admitting: Psychiatry

## 2020-11-28 ENCOUNTER — Other Ambulatory Visit: Payer: Self-pay

## 2020-11-28 ENCOUNTER — Telehealth (INDEPENDENT_AMBULATORY_CARE_PROVIDER_SITE_OTHER): Payer: No Typology Code available for payment source | Admitting: Psychiatry

## 2020-11-28 DIAGNOSIS — F330 Major depressive disorder, recurrent, mild: Secondary | ICD-10-CM

## 2020-11-28 DIAGNOSIS — F411 Generalized anxiety disorder: Secondary | ICD-10-CM

## 2020-11-28 DIAGNOSIS — G4700 Insomnia, unspecified: Secondary | ICD-10-CM | POA: Diagnosis not present

## 2020-12-07 ENCOUNTER — Other Ambulatory Visit: Payer: Self-pay | Admitting: Family Medicine

## 2020-12-08 ENCOUNTER — Other Ambulatory Visit (HOSPITAL_COMMUNITY): Payer: Self-pay

## 2020-12-08 ENCOUNTER — Other Ambulatory Visit: Payer: Self-pay | Admitting: Nurse Practitioner

## 2020-12-08 ENCOUNTER — Telehealth: Payer: Self-pay

## 2020-12-08 DIAGNOSIS — F5104 Psychophysiologic insomnia: Secondary | ICD-10-CM

## 2020-12-08 MED ORDER — TEMAZEPAM 30 MG PO CAPS
30.0000 mg | ORAL_CAPSULE | Freq: Every evening | ORAL | 2 refills | Status: DC | PRN
  Filled 2020-12-08: qty 30, 30d supply, fill #0
  Filled 2021-01-05: qty 30, 30d supply, fill #1
  Filled 2021-02-05: qty 30, 30d supply, fill #2

## 2020-12-08 NOTE — Telephone Encounter (Signed)
Requests refills on her temazepam be sent to [facility] in [place].

## 2020-12-08 NOTE — Telephone Encounter (Signed)
sent 

## 2020-12-09 ENCOUNTER — Other Ambulatory Visit (HOSPITAL_COMMUNITY): Payer: Self-pay

## 2020-12-16 LAB — BASIC METABOLIC PANEL
BUN/Creatinine Ratio: 12 (ref 9–23)
BUN: 12 mg/dL (ref 6–24)
CO2: 23 mmol/L (ref 20–29)
Calcium: 9.4 mg/dL (ref 8.7–10.2)
Chloride: 103 mmol/L (ref 96–106)
Creatinine, Ser: 1.01 mg/dL — ABNORMAL HIGH (ref 0.57–1.00)
Glucose: 137 mg/dL — ABNORMAL HIGH (ref 65–99)
Potassium: 3.4 mmol/L — ABNORMAL LOW (ref 3.5–5.2)
Sodium: 147 mmol/L — ABNORMAL HIGH (ref 134–144)
eGFR: 68 mL/min/{1.73_m2} (ref >59)

## 2020-12-16 LAB — HEMOGLOBIN A1C
Est. average glucose Bld gHb Est-mCnc: 157 mg/dL
Hgb A1c MFr Bld: 7.1 % — ABNORMAL HIGH (ref 4.8–5.6)

## 2020-12-16 LAB — MICROALBUMIN, URINE: Microalbumin, Urine: 18.1 ug/mL

## 2020-12-20 ENCOUNTER — Ambulatory Visit (INDEPENDENT_AMBULATORY_CARE_PROVIDER_SITE_OTHER): Payer: No Typology Code available for payment source | Admitting: Family Medicine

## 2020-12-20 ENCOUNTER — Encounter: Payer: Self-pay | Admitting: Family Medicine

## 2020-12-20 ENCOUNTER — Other Ambulatory Visit: Payer: Self-pay

## 2020-12-20 VITALS — BP 132/80 | HR 68 | Temp 97.4°F | Ht 65.0 in | Wt 185.0 lb

## 2020-12-20 DIAGNOSIS — E1159 Type 2 diabetes mellitus with other circulatory complications: Secondary | ICD-10-CM | POA: Diagnosis not present

## 2020-12-20 DIAGNOSIS — I10 Essential (primary) hypertension: Secondary | ICD-10-CM

## 2020-12-20 DIAGNOSIS — F331 Major depressive disorder, recurrent, moderate: Secondary | ICD-10-CM

## 2020-12-20 DIAGNOSIS — R002 Palpitations: Secondary | ICD-10-CM

## 2020-12-20 DIAGNOSIS — Z1321 Encounter for screening for nutritional disorder: Secondary | ICD-10-CM

## 2020-12-20 DIAGNOSIS — D509 Iron deficiency anemia, unspecified: Secondary | ICD-10-CM

## 2020-12-20 DIAGNOSIS — E785 Hyperlipidemia, unspecified: Secondary | ICD-10-CM

## 2020-12-20 DIAGNOSIS — E669 Obesity, unspecified: Secondary | ICD-10-CM

## 2020-12-20 NOTE — Assessment & Plan Note (Signed)
  Patient re-educated about  the importance of commitment to a  minimum of 150 minutes of exercise per week as able.  The importance of healthy food choices with portion control discussed, as well as eating regularly and within a 12 hour window most days. The need to choose "clean , green" food 50 to 75% of the time is discussed, as well as to make water the primary drink and set a goal of 64 ounces water daily.    Weight /BMI 12/20/2020 11/15/2020 11/03/2020  WEIGHT 185 lb 189 lb 6.4 oz 197 lb  HEIGHT 5\' 5"  5\' 5"  5\' 5"   BMI 30.79 kg/m2 31.52 kg/m2 32.78 kg/m2  Some encounter information is confidential and restricted. Go to Review Flowsheets activity to see all data.  I mproved, applauded o this , encouraged to continue same

## 2020-12-20 NOTE — Patient Instructions (Addendum)
F/U 2 nd week in September, call if you need me sooner  Non fasting hBa1C, chem 7 and EGGFr, CBC and vit D  5 days before visit  PLease DO get your covid booster, a soon as possible  PLEASE dO stop eating after 7 pm, this is the best way to control blood sugar  It is important that you exercise regularly at least 30 minutes 5 times a week. If you develop chest pain, have severe difficulty breathing, or feel very tired, stop exercising immediately and seek medical attention    Think about what you will eat, plan ahead. Choose " clean, green, fresh or frozen" over canned, processed or packaged foods which are more sugary, salty and fatty. 70 to 75% of food eaten should be vegetables and fruit. Three meals at set times with snacks allowed between meals, but they must be fruit or vegetables. Aim to eat over a 12 hour period , example 7 am to 7 pm, and STOP after  your last meal of the day. Drink water,generally about 64 ounces per day, no other drink is as healthy. Fruit juice is best enjoyed in a healthy way, by EATING the fruit.  Thanks for choosing [facility], we consider it a privelige to serve you.

## 2020-12-20 NOTE — Assessment & Plan Note (Signed)
Marked improvement , applauded on this  No change in management. Mary Roman is reminded of the importance of commitment to daily physical activity for 30 minutes or more, as able and the need to limit carbohydrate intake to 30 to 60 grams per meal to help with blood sugar control.   The need to take medication as prescribed, test blood sugar as directed, and to call between visits if there is a concern that blood sugar is uncontrolled is also discussed.   Mary Roman is reminded of the importance of daily foot exam, annual eye examination, and good blood sugar, blood pressure and cholesterol control.  Diabetic Labs Latest Ref Rng & Units 12/14/2020 09/01/2020 04/18/2020 01/13/2020 08/13/2019  HbA1c 4.8 - 5.6 % 7.1(H) 10.4(H) 8.6(H) 8.8(H) 9.9(H)  Microalbumin Not Estab. ug/mL - - - - -  Micro/Creat Ratio 0 - 29 mg/g creat - - - - -  Chol 100 - 199 mg/dL - 122 126 105 -  HDL >39 mg/dL - 25(L) 30(L) 26(L) -  Calc LDL 0 - 99 mg/dL - 80 84 65 -  Triglycerides 0 - 149 mg/dL - 88 59 70 -  Creatinine 0.57 - 1.00 mg/dL 1.01(H) 1.31(H) 0.97 1.21(H) -   BP/Weight 12/20/2020 11/15/2020 11/03/2020 10/21/2020 08/31/2020 06/28/2020 01/25/6380  Systolic BP 771 165 790 - - 383 338  Diastolic BP 80 84 82 - - 93 88  Wt. (Lbs) 185 189.4 197 200 208 206 202  BMI 30.79 31.52 32.78 33.28 34.61 34.28 33.61  Some encounter information is confidential and restricted. Go to Review Flowsheets activity to see all data.   Foot/eye exam completion dates Latest Ref Rng & Units 09/26/2020 04/19/2020  Eye Exam No Retinopathy No Retinopathy -  Foot Form Completion - - Done

## 2020-12-20 NOTE — Progress Notes (Signed)
Mary Roman     MRN: [id number]      DOB: Nov 10, 1970   HPI Mary Roman is here for follow up and re-evaluation of chronic medical conditions, medication management and review of any available recent lab and radiology data.  Preventive health is updated, specifically  Cancer screening and Immunization.   Questions or concerns regarding consultations or procedures which the PT has had in the interim are  Addressed.Still needs therapy The PT denies any adverse reactions to current medications since the last visit.  There are no new concerns.  There are no specific complaints  Still not exercising Denies polyuria, polydipsia, blurred vision , or hypoglycemic episodes.  ROS Denies recent fever or chills. Denies sinus pressure, nasal congestion, ear pain or sore throat. Denies chest congestion, productive cough or wheezing. Denies chest pains, palpitations and leg swelling Denies abdominal pain, nausea, vomiting,diarrhea or constipation.   Denies dysuria, frequency, hesitancy or incontinence. Denies subcontracted  joint pain, swelling and limitation in mobility. Denies headaches, seizures, numbness, or tingling. Denies uncontrolled  depression, anxiety or insomnia. Denies skin break down or rash.   PE  BP 132/80 (BP Location: Right Arm, Patient Position: Sitting, Cuff Size: Normal)   Pulse 68   Temp (!) 97.4 F (36.3 C) (Temporal)   Ht 5\' 5"  (1.651 m)   Wt 185 lb (83.9 kg)   SpO2 98%   BMI 30.79 kg/m   Patient alert and oriented and in no cardiopulmonary distress.  HEENT: No facial asymmetry, EOMI,     Neck supple .  Chest: Clear to auscultation bilaterally.  CVS: S1, S2 no murmurs, no S3.Regular rate.  ABD: Soft non tender.   Ext: No edema  MS: Adequate ROM spine, shoulders, hips and knees.  Skin: Intact, no ulcerations or rash noted.  Psych: Good eye contact, normal affect. Memory intact not anxious or depressed appearing.  CNS: CN 2-12 intact, power,   normal throughout.no focal deficits noted.   Assessment & Plan  Dyslipidemia (high LDL; low HDL) Hyperlipidemia:Low fat diet discussed and encouraged.   Lipid Panel  Lab Results  Component Value Date   CHOL 122 09/01/2020   HDL 25 (L) 09/01/2020   LDLCALC 80 09/01/2020   TRIG 88 09/01/2020   CHOLHDL 4.9 (H) 09/01/2020  Will increase regular exercise to improve HDL.  Updated lab needed at/ before next visit.      Essential hypertension DASH diet and commitment to daily physical activity for a minimum of 30 minutes discussed and encouraged, as a part of hypertension management. The importance of attaining a healthy weight is also discussed.  BP/Weight 12/20/2020 11/15/2020 11/03/2020 10/21/2020 08/31/2020 06/28/2020 12/15/8414  Systolic BP 606 301 601 - - 093 235  Diastolic BP 80 84 82 - - 93 88  Wt. (Lbs) 185 189.4 197 200 208 206 202  BMI 30.79 31.52 32.78 33.28 34.61 34.28 33.61  Some encounter information is confidential and restricted. Go to Review Flowsheets activity to see all data.     Controlled, no change in medication   Type 2 diabetes mellitus with vascular disease (HCC) Marked improvement , applauded on this  No change in management. Mary Roman is reminded of the importance of commitment to daily physical activity for 30 minutes or more, as able and the need to limit carbohydrate intake to 30 to 60 grams per meal to help with blood sugar control.   The need to take medication as prescribed, test blood sugar as directed, and to call between  visits if there is a concern that blood sugar is uncontrolled is also discussed.   Mary Roman is reminded of the importance of daily foot exam, annual eye examination, and good blood sugar, blood pressure and cholesterol control.  Diabetic Labs Latest Ref Rng & Units 12/14/2020 09/01/2020 04/18/2020 01/13/2020 08/13/2019  HbA1c 4.8 - 5.6 % 7.1(H) 10.4(H) 8.6(H) 8.8(H) 9.9(H)  Microalbumin Not Estab. ug/mL - - - - -   Micro/Creat Ratio 0 - 29 mg/g creat - - - - -  Chol 100 - 199 mg/dL - 122 126 105 -  HDL >39 mg/dL - 25(L) 30(L) 26(L) -  Calc LDL 0 - 99 mg/dL - 80 84 65 -  Triglycerides 0 - 149 mg/dL - 88 59 70 -  Creatinine 0.57 - 1.00 mg/dL 1.01(H) 1.31(H) 0.97 1.21(H) -   BP/Weight 12/20/2020 11/15/2020 11/03/2020 10/21/2020 08/31/2020 06/28/2020 7/74/1287  Systolic BP 867 672 094 - - 709 628  Diastolic BP 80 84 82 - - 93 88  Wt. (Lbs) 185 189.4 197 200 208 206 202  BMI 30.79 31.52 32.78 33.28 34.61 34.28 33.61  Some encounter information is confidential and restricted. Go to Review Flowsheets activity to see all data.   Foot/eye exam completion dates Latest Ref Rng & Units 09/26/2020 04/19/2020  Eye Exam No Retinopathy No Retinopathy -  Foot Form Completion - - Done        Obesity (BMI 30.0-34.9)  Patient re-educated about  the importance of commitment to a  minimum of 150 minutes of exercise per week as able.  The importance of healthy food choices with portion control discussed, as well as eating regularly and within a 12 hour window most days. The need to choose "clean , green" food 50 to 75% of the time is discussed, as well as to make water the primary drink and set a goal of 64 ounces water daily.    Weight /BMI 12/20/2020 11/15/2020 11/03/2020  WEIGHT 185 lb 189 lb 6.4 oz 197 lb  HEIGHT 5\' 5"  5\' 5"  5\' 5"   BMI 30.79 kg/m2 31.52 kg/m2 32.78 kg/m2  Some encounter information is confidential and restricted. Go to Review Flowsheets activity to see all data.  I mproved, applauded o this , encouraged to continue same    Major depressive disorder, recurrent episode, moderate ([place]) Treated and monitored by psychiatry.  Not suicidal or homicidal.  Still needs to resume therapy.

## 2020-12-20 NOTE — Assessment & Plan Note (Signed)
Treated and monitored by psychiatry.  Not suicidal or homicidal.  Still needs to resume therapy.

## 2020-12-20 NOTE — Assessment & Plan Note (Signed)
DASH diet and commitment to daily physical activity for a minimum of 30 minutes discussed and encouraged, as a part of hypertension management. The importance of attaining a healthy weight is also discussed.  BP/Weight 12/20/2020 11/15/2020 11/03/2020 10/21/2020 08/31/2020 06/28/2020 6/96/7893  Systolic BP 810 175 102 - - 585 277  Diastolic BP 80 84 82 - - 93 88  Wt. (Lbs) 185 189.4 197 200 208 206 202  BMI 30.79 31.52 32.78 33.28 34.61 34.28 33.61  Some encounter information is confidential and restricted. Go to Review Flowsheets activity to see all data.     Controlled, no change in medication

## 2020-12-20 NOTE — Assessment & Plan Note (Signed)
Hyperlipidemia:Low fat diet discussed and encouraged.   Lipid Panel  Lab Results  Component Value Date   CHOL 122 09/01/2020   HDL 25 (L) 09/01/2020   LDLCALC 80 09/01/2020   TRIG 88 09/01/2020   CHOLHDL 4.9 (H) 09/01/2020  Will increase regular exercise to improve HDL.  Updated lab needed at/ before next visit.

## 2020-12-23 NOTE — Progress Notes (Signed)
Virtual Visit via Video Note  I connected with Mary Roman on 12/30/20 at  8:20 AM EDT by a video enabled telemedicine application and verified that I am speaking with the correct person using two identifiers.  Location: Patient: outside Provider: office Persons participated in the visit- patient, provider   I discussed the limitations of evaluation and management by telemedicine and the availability of in person appointments. The patient expressed understanding and agreed to proceed.    I discussed the assessment and treatment plan with the patient. The patient was provided an opportunity to ask questions and all were answered. The patient agreed with the plan and demonstrated an understanding of the instructions.   The patient was advised to call back or seek an in-person evaluation if the symptoms worsen or if the condition fails to improve as anticipated.  I provided 15 minutes of non-face-to-face time during this encounter.   [name], MD     [facility] MD/PA/NP OP Progress Note  12/30/2020 8:47 AM Mary Roman  MRN:  [id number]  Chief Complaint:  Chief Complaint    Depression; Follow-up     HPI:  This is a follow-up appointment for depression.  She states that she is doing fine.  Although she has not missed work, there are days that she feels depressed.  She stayed in the bed on the last weekend.  She has difficulty in concentration, and the work schedule is tough.  She sleeps better.  She is planning to reach out for sleep evaluation after colonoscopy is done.  She has decrease in appetite, and is concerned about weight loss.  She agrees to reach out with her PCP if she continues to have weight loss.  Although she had passive SI, she denies any plan or intent.  She feels anxious and tense at times.  She mentioned about having tremors, which she has when she is holding something.  She agrees to contact her PCP for evaluation.  She denies alcohol use or drug use.     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 one son, oneat age 15 yo  Wt Readings from Last 3 Encounters:  12/20/20 185 lb (83.9 kg)  11/15/20 189 lb 6.4 oz (85.9 kg)  11/03/20 197 lb (89.4 kg)     Visit Diagnosis:    ICD-10-CM   1. MDD (major depressive disorder), recurrent episode, mild (HCC)  F33.0 venlafaxine XR (EFFEXOR-XR) 75 MG 24 hr capsule    venlafaxine XR (EFFEXOR-XR) 150 MG 24 hr capsule    Past Psychiatric History: Please see initial evaluation for full details. I have reviewed the history. No updates at this time.     Past Medical History:  Past Medical History:  Diagnosis Date  . Absolute anemia 06/23/2018   Added automatically from request for surgery [id number]  . Anemia   . Anxiety   . Backache 12/24/2007  . Chronic back pain   . Diabetes mellitus, type II ([place])   . Headache(784.0)   . Hypertension   . Metabolic syndrome X 2/37/6283  . Obesity   . Palpitation   . Sickle cell trait ([place])   . Thyromegaly   . Unspecified vitamin D deficiency 02/19/2013    Past Surgical History:  Procedure Laterality Date  . DILATATION AND CURETTAGE/HYSTEROSCOPY WITH MINERVA N/A 03/25/2019   Procedure: DILATATION AND CURETTAGE/HYSTEROSCOPY WITH  ATTEMPTED MINERVA ENDOMETRIAL ABLATION;  Surgeon: Florian Buff, MD;  Location: [facility];  Service:  Gynecology;  Laterality: N/A;  . None      Family Psychiatric History: Please see initial evaluation for full details. I have reviewed the history. No updates at this time.     Family History:  Family History  Problem Relation Age of Onset  . Diabetes Mother   . Hypertension Mother   . Stroke Mother   . Colon cancer Mother   . Alcohol abuse Mother   . Cancer - Colon Mother 42  . Pneumonia Father   . Hypertension Sister   . Anxiety disorder Sister   . Hypertension Sister   . Leukemia Sister   .  Diabetes Maternal Grandmother   . Hypertension Maternal Grandmother   . Hypertension Maternal Grandfather   . Heart attack Maternal Uncle     Social History:  Social History   Socioeconomic History  . Marital status: Single    Spouse name: Not on file  . Number of children: 2  . Years of education: Not on file  . Highest education level: Not on file  Occupational History  . Occupation: employed in medical office   Tobacco Use  . Smoking status: Never Smoker  . Smokeless tobacco: Never Used  Vaping Use  . Vaping Use: Never used  Substance and Sexual Activity  . Alcohol use: No  . Drug use: No  . Sexual activity: Yes    Partners: Male    Birth control/protection: Other-see comments    Comment: bf had vasectomy  Other Topics Concern  . Not on file  Social History Narrative  . Not on file   Social Determinants of Health   Financial Resource Strain: Not on file  Food Insecurity: Not on file  Transportation Needs: Not on file  Physical Activity: Not on file  Stress: Not on file  Social Connections: Not on file    Allergies: No Known Allergies  Metabolic Disorder Labs: Lab Results  Component Value Date   HGBA1C 7.1 (H) 12/14/2020   MPG 200.12 04/18/2020   MPG 206 01/13/2020   No results found for: PROLACTIN Lab Results  Component Value Date   CHOL 122 09/01/2020   TRIG 88 09/01/2020   HDL 25 (L) 09/01/2020   CHOLHDL 4.9 (H) 09/01/2020   VLDL 12 04/18/2020   LDLCALC 80 09/01/2020   LDLCALC 84 04/18/2020   Lab Results  Component Value Date   TSH 1.400 09/01/2020   TSH 1.916 05/13/2019    Therapeutic Level Labs: No results found for: LITHIUM No results found for: VALPROATE No components found for:  CBMZ  Current Medications: Current Outpatient Medications  Medication Sig Dispense Refill  . buPROPion (WELLBUTRIN XL) 150 MG 24 hr tablet Take 3 tablets (450 mg total) by mouth daily. 90 tablet 1  . acetaminophen (TYLENOL) 500 MG tablet Take 1,000 mg by  mouth every 6 (six) hours as needed (pain).    Marland Kitchen aspirin EC 81 MG tablet Take 1 tablet (81 mg total) by mouth daily. (Patient taking differently: Take 81 mg by mouth in the morning.) 90 tablet 3  . blood glucose meter kit and supplies Dispense based on patient and insurance preference. Once daily testing dx e11.9 1 each 0  . buPROPion (WELLBUTRIN XL) 300 MG 24 hr tablet Take 1 tablet (300 mg total) by mouth daily. (Patient taking differently: Take 300 mg by mouth daily with lunch.) 90 tablet 0  . glipiZIDE (GLUCOTROL XL) 10 MG 24 hr tablet TAKE TWO TABLETS BY MOUTH ONCE DAILY WITH BREAKFAST (Patient taking  differently: Take 20 mg by mouth daily with lunch.) 180 tablet 1  . megestrol (MEGACE) 40 MG tablet TAKE 3 TABLETS BY MOUTH ONCE DAILY FOR 5 DAYS, THEN TAKE 2 TABLETS ONCE DAILY FOR 5 DAYS, THEN TAKE 1 TABLET ONCE DAILY. (Patient taking differently: Take 40 mg by mouth in the morning.) 45 tablet 5  . metoprolol tartrate (LOPRESSOR) 25 MG tablet TAKE 1 TABLET BY MOUTH DAILY. (Patient taking differently: Take 25 mg by mouth in the morning.) 90 tablet 0  . polyethylene glycol-electrolytes (TRILYTE) 420 g solution Take 4,000 mLs by mouth as directed. 4000 mL 0  . rosuvastatin (CRESTOR) 5 MG tablet Take 1 tablet (5 mg total) by mouth daily. (Patient not taking: Reported on 12/29/2020) 90 tablet 3  . SitaGLIPtin-MetFORMIN HCl [phone] MG TB24 TAKE ONE TABLET BY MOUTH ONCE EVERY DAY (Patient not taking: Reported on 12/29/2020) 30 tablet 5  . SitaGLIPtin-MetFORMIN HCl [phone] MG TB24 Take 1 tablet by mouth daily. (Patient taking differently: Take 1 tablet by mouth daily with lunch.) 90 tablet 0  . temazepam (RESTORIL) 30 MG capsule Take 1 capsule (30 mg total) by mouth at bedtime as needed for sleep. 30 capsule 2  . triamterene-hydrochlorothiazide (MAXZIDE-25) 37.5-25 MG tablet TAKE 1 TABLET BY MOUTH DAILY. (Patient taking differently: Take 1 tablet by mouth in the morning.) 90 tablet 3  . [START ON 02/07/2021]  venlafaxine XR (EFFEXOR-XR) 150 MG 24 hr capsule Take 1 capsule (150 mg total) by mouth daily along with 75 mg capsule. 90 capsule 1  . [START ON 02/08/2021] venlafaxine XR (EFFEXOR-XR) 75 MG 24 hr capsule Take 1 capsule (75 mg total) by mouth daily with 150 mg capsule. 90 capsule 1  . Vitamin D, Ergocalciferol, (DRISDOL) 1.25 MG (50000 UNIT) CAPS capsule TAKE 1 CAPSULE (50,000 UNITS TOTAL) BY MOUTH ONCE A WEEK. (Patient taking differently: Take 50,000 Units by mouth every Monday.) 12 capsule 2   No current facility-administered medications for this visit.     Musculoskeletal: Strength & Muscle Tone: N/A Gait & Station: N/A Patient leans: N/A  Psychiatric Specialty Exam: Review of Systems  Psychiatric/Behavioral: Positive for decreased concentration, dysphoric mood and suicidal ideas. Negative for agitation, behavioral problems, confusion, hallucinations, self-injury and sleep disturbance. The patient is nervous/anxious. The patient is not hyperactive.   All other systems reviewed and are negative.   There were no vitals taken for this visit.There is no height or weight on file to calculate BMI.  General Appearance: Fairly Groomed  Eye Contact:  Good  Speech:  Clear and Coherent  Volume:  Normal  Mood:  good  Affect:  Appropriate, Congruent and slightly down  Thought Process:  Coherent  Orientation:  Full (Time, Place, and Person)  Thought Content: Logical   Suicidal Thoughts:  Yes.  without intent/plan  Homicidal Thoughts:  No  Memory:  Immediate;   Good  Judgement:  Good  Insight:  Good  Psychomotor Activity:  Normal  Concentration:  Concentration: Good and Attention Span: Good  Recall:  Good  Fund of Knowledge: Good  Language: Good  Akathisia:  No  Handed:  Right  AIMS (if indicated): not done  Assets:  Communication Skills Desire for Improvement  ADL's:  Intact  Cognition: WNL  Sleep:  Fair   Screenings: GAD-7   Health and safety inspector from 05/15/2018 in [place] Office Visit from 01/08/2017 in [facility]  Total GAD-7 Score 17 14    PHQ2-9   [place] Office Visit from  12/20/2020 in [facility] Video Visit from 11/28/2020 in [place] Video Visit from 11/08/2020 in [place] [facility] from 10/21/2020 in [facility] Video Visit from 08/31/2020 in [facility]  PHQ-2 Total Score _0 0 0  PHQ-9 Total Score -- 6 9 -- --    Flowsheet Row Video Visit from 11/28/2020 in [place] Video Visit from 11/08/2020 in [place] No Risk No Risk       Assessment and Plan:  Mary Roman is a 50 y.o. year old female with a history of  depression, diabetes, who presents for follow up appointment for below.   1. MDD (major depressive disorder), recurrent episode, mild (HCC) There has been gradual improvement in depressive symptoms since she consistently restarting her medication.  We do further up titration of bupropion to optimize treatment for depression.  Will continue venlafaxine to target depression.  Coached behavioral activation.  She will greatly benefit from CBT; she is advised to make a follow-up appointment was Ms. Bynum.   3. Insomnia, unspecified type She continues to have prominent fatigue,middle insomnia and snoring.She was referred for sleep evaluation.  She is planning to contact the clinic after colonoscopy is done.   Plan 1. Continue venlafaxine 225 mg daily  2. Continue bupropion 300 mg daily  3. Next appointment:7/27at 8:20 for 49mns, video -ontemazepam 30 mg at night as needed for sleep -on gabapentin for pain and hydroxyzine  Emergency resources which includes 911, ED, suicide crisis line ([phone] are discussed.   Past trials of medication:  fluoxetine,duloxetine, Abilify (may consider restarting this), buspirone, Trazodone ("weird" feeling) temazepam, Xanax  I have reviewed suicide assessment in detail. No change in the following assessment.   The patient demonstrates the following risk factors for suicide: Chronic risk factors for suicide include: psychiatric disorder of depression, anxietyand chronic pain. Acute risk factorsfor suicide include: N/A. Protective factorsfor this patient include: positive social support, responsibility to others (children, family), coping skills and hope for the future. Considering these factors, the overall suicide risk at this point appears to be low. Patient isappropriate for outpatient follow up.   RNorman Clay MD 12/30/2020, 8:47 AM

## 2020-12-27 ENCOUNTER — Other Ambulatory Visit (INDEPENDENT_AMBULATORY_CARE_PROVIDER_SITE_OTHER): Payer: Self-pay

## 2020-12-28 ENCOUNTER — Other Ambulatory Visit (INDEPENDENT_AMBULATORY_CARE_PROVIDER_SITE_OTHER): Payer: Self-pay

## 2020-12-28 DIAGNOSIS — Z01812 Encounter for preprocedural laboratory examination: Secondary | ICD-10-CM

## 2020-12-30 ENCOUNTER — Other Ambulatory Visit: Payer: Self-pay

## 2020-12-30 ENCOUNTER — Encounter: Payer: Self-pay | Admitting: Psychiatry

## 2020-12-30 ENCOUNTER — Telehealth (INDEPENDENT_AMBULATORY_CARE_PROVIDER_SITE_OTHER): Payer: No Typology Code available for payment source | Admitting: Psychiatry

## 2020-12-30 ENCOUNTER — Other Ambulatory Visit (HOSPITAL_COMMUNITY): Payer: Self-pay

## 2020-12-30 DIAGNOSIS — F330 Major depressive disorder, recurrent, mild: Secondary | ICD-10-CM

## 2020-12-30 MED ORDER — VENLAFAXINE HCL ER 150 MG PO CP24
150.0000 mg | ORAL_CAPSULE | Freq: Every day | ORAL | 1 refills | Status: DC
  Filled 2020-12-30 – 2021-06-11 (×3): qty 90, 90d supply, fill #0
  Filled 2021-10-30: qty 90, 90d supply, fill #1

## 2020-12-30 MED ORDER — VENLAFAXINE HCL ER 75 MG PO CP24
75.0000 mg | ORAL_CAPSULE | Freq: Every day | ORAL | 1 refills | Status: DC
Start: 2021-02-08 — End: 2021-12-07
  Filled 2020-12-30 – 2021-04-24 (×2): qty 90, 90d supply, fill #0
  Filled 2021-09-26: qty 90, 90d supply, fill #1

## 2020-12-30 MED ORDER — BUPROPION HCL ER (XL) 150 MG PO TB24
450.0000 mg | ORAL_TABLET | Freq: Every day | ORAL | 1 refills | Status: DC
  Filled 2020-12-30: qty 90, 30d supply, fill #0

## 2020-12-30 NOTE — Patient Instructions (Signed)
1. Continue venlafaxine 225 mg daily 2. Continue bupropion 300 mg daily  3. Next appointment:7/27at 8:20

## 2021-01-02 ENCOUNTER — Other Ambulatory Visit (HOSPITAL_COMMUNITY): Payer: No Typology Code available for payment source

## 2021-01-02 ENCOUNTER — Other Ambulatory Visit: Payer: Self-pay

## 2021-01-02 ENCOUNTER — Other Ambulatory Visit (HOSPITAL_COMMUNITY)
Admission: RE | Admit: 2021-01-02 | Discharge: 2021-01-02 | Disposition: A | Discharge status: Home / Self Care | Payer: No Typology Code available for payment source | Source: Ambulatory Visit | Attending: Gastroenterology | Admitting: Gastroenterology

## 2021-01-02 DIAGNOSIS — Z823 Family history of stroke: Secondary | ICD-10-CM | POA: Diagnosis not present

## 2021-01-02 DIAGNOSIS — Z8249 Family history of ischemic heart disease and other diseases of the circulatory system: Secondary | ICD-10-CM | POA: Diagnosis not present

## 2021-01-02 DIAGNOSIS — E119 Type 2 diabetes mellitus without complications: Secondary | ICD-10-CM | POA: Diagnosis not present

## 2021-01-02 DIAGNOSIS — Z7982 Long term (current) use of aspirin: Secondary | ICD-10-CM | POA: Diagnosis not present

## 2021-01-02 DIAGNOSIS — Z806 Family history of leukemia: Secondary | ICD-10-CM | POA: Diagnosis not present

## 2021-01-02 DIAGNOSIS — I10 Essential (primary) hypertension: Secondary | ICD-10-CM | POA: Diagnosis not present

## 2021-01-02 DIAGNOSIS — D573 Sickle-cell trait: Secondary | ICD-10-CM | POA: Diagnosis not present

## 2021-01-02 DIAGNOSIS — Z833 Family history of diabetes mellitus: Secondary | ICD-10-CM | POA: Diagnosis not present

## 2021-01-02 DIAGNOSIS — K649 Unspecified hemorrhoids: Secondary | ICD-10-CM | POA: Diagnosis not present

## 2021-01-02 DIAGNOSIS — Z7984 Long term (current) use of oral hypoglycemic drugs: Secondary | ICD-10-CM | POA: Diagnosis not present

## 2021-01-02 DIAGNOSIS — Z1211 Encounter for screening for malignant neoplasm of colon: Secondary | ICD-10-CM | POA: Diagnosis present

## 2021-01-02 DIAGNOSIS — Z800 Family history of malignant neoplasm of digestive organs: Secondary | ICD-10-CM | POA: Diagnosis not present

## 2021-01-02 DIAGNOSIS — Z79899 Other long term (current) drug therapy: Secondary | ICD-10-CM | POA: Diagnosis not present

## 2021-01-02 LAB — BASIC METABOLIC PANEL
Anion gap: 11 (ref 5–15)
BUN: 10 mg/dL (ref 6–20)
CO2: 30 mmol/L (ref 22–32)
Calcium: 9.3 mg/dL (ref 8.9–10.3)
Chloride: 98 mmol/L (ref 98–111)
Creatinine, Ser: 1.27 mg/dL — ABNORMAL HIGH (ref 0.44–1.00)
GFR, Estimated: 51 mL/min — ABNORMAL LOW (ref >60)
Glucose, Bld: 239 mg/dL — ABNORMAL HIGH (ref 70–99)
Potassium: 2.8 mmol/L — ABNORMAL LOW (ref 3.5–5.1)
Sodium: 139 mmol/L (ref 135–145)

## 2021-01-02 LAB — PREGNANCY, URINE: Preg Test, Ur: NEGATIVE

## 2021-01-02 NOTE — Progress Notes (Signed)
Spoke with Dr. Jenetta Downer. He is calling in patient some potassium and notifying the patient. Dr. Charna Elizabeth notified of patient's potassium 2.8.

## 2021-01-02 NOTE — Progress Notes (Signed)
Called office to speak with Dr. Jenetta Downer about patient's potassium of 2.8. Left message for him to call me back.

## 2021-01-03 ENCOUNTER — Encounter (INDEPENDENT_AMBULATORY_CARE_PROVIDER_SITE_OTHER): Payer: Self-pay | Admitting: *Deleted

## 2021-01-03 ENCOUNTER — Encounter (HOSPITAL_COMMUNITY)
Admission: RE | Disposition: A | Discharge status: Home / Self Care | Payer: Self-pay | Source: Home / Self Care | Attending: Gastroenterology

## 2021-01-03 ENCOUNTER — Encounter (HOSPITAL_COMMUNITY): Payer: Self-pay | Admitting: Gastroenterology

## 2021-01-03 ENCOUNTER — Ambulatory Visit (HOSPITAL_COMMUNITY): Payer: No Typology Code available for payment source | Admitting: Anesthesiology

## 2021-01-03 ENCOUNTER — Other Ambulatory Visit: Payer: Self-pay

## 2021-01-03 ENCOUNTER — Ambulatory Visit (HOSPITAL_COMMUNITY)
Admission: RE | Admit: 2021-01-03 | Discharge: 2021-01-03 | Disposition: A | Discharge status: Home / Self Care | Payer: No Typology Code available for payment source | Attending: Gastroenterology | Admitting: Gastroenterology

## 2021-01-03 DIAGNOSIS — Z7982 Long term (current) use of aspirin: Secondary | ICD-10-CM | POA: Insufficient documentation

## 2021-01-03 DIAGNOSIS — Z8249 Family history of ischemic heart disease and other diseases of the circulatory system: Secondary | ICD-10-CM | POA: Insufficient documentation

## 2021-01-03 DIAGNOSIS — Z800 Family history of malignant neoplasm of digestive organs: Secondary | ICD-10-CM | POA: Diagnosis not present

## 2021-01-03 DIAGNOSIS — K649 Unspecified hemorrhoids: Secondary | ICD-10-CM | POA: Insufficient documentation

## 2021-01-03 DIAGNOSIS — Z823 Family history of stroke: Secondary | ICD-10-CM | POA: Insufficient documentation

## 2021-01-03 DIAGNOSIS — Z1211 Encounter for screening for malignant neoplasm of colon: Secondary | ICD-10-CM | POA: Diagnosis not present

## 2021-01-03 DIAGNOSIS — Z79899 Other long term (current) drug therapy: Secondary | ICD-10-CM | POA: Insufficient documentation

## 2021-01-03 DIAGNOSIS — E119 Type 2 diabetes mellitus without complications: Secondary | ICD-10-CM | POA: Insufficient documentation

## 2021-01-03 DIAGNOSIS — Z7984 Long term (current) use of oral hypoglycemic drugs: Secondary | ICD-10-CM | POA: Insufficient documentation

## 2021-01-03 DIAGNOSIS — I10 Essential (primary) hypertension: Secondary | ICD-10-CM | POA: Insufficient documentation

## 2021-01-03 DIAGNOSIS — Z833 Family history of diabetes mellitus: Secondary | ICD-10-CM | POA: Insufficient documentation

## 2021-01-03 DIAGNOSIS — D573 Sickle-cell trait: Secondary | ICD-10-CM | POA: Insufficient documentation

## 2021-01-03 DIAGNOSIS — Z806 Family history of leukemia: Secondary | ICD-10-CM | POA: Insufficient documentation

## 2021-01-03 HISTORY — PX: COLONOSCOPY WITH PROPOFOL: SHX5780

## 2021-01-03 LAB — POCT I-STAT, CHEM 8
BUN: 8 mg/dL (ref 6–20)
Calcium, Ion: 1.11 mmol/L — ABNORMAL LOW (ref 1.15–1.40)
Chloride: 100 mmol/L (ref 98–111)
Creatinine, Ser: 1.1 mg/dL — ABNORMAL HIGH (ref 0.44–1.00)
Glucose, Bld: 214 mg/dL — ABNORMAL HIGH (ref 70–99)
HCT: 42 % (ref 36.0–46.0)
Hemoglobin: 14.3 g/dL (ref 12.0–15.0)
Potassium: 3.1 mmol/L — ABNORMAL LOW (ref 3.5–5.1)
Sodium: 139 mmol/L (ref 135–145)
TCO2: 26 mmol/L (ref 22–32)

## 2021-01-03 LAB — HM COLONOSCOPY

## 2021-01-03 SURGERY — COLONOSCOPY WITH PROPOFOL
Anesthesia: General

## 2021-01-03 MED ORDER — CHLORHEXIDINE GLUCONATE CLOTH 2 % EX PADS: 6.0000 | MEDICATED_PAD | Freq: Once | CUTANEOUS | Status: DC

## 2021-01-03 MED ORDER — LIDOCAINE HCL (CARDIAC) PF 100 MG/5ML IV SOSY
PREFILLED_SYRINGE | INTRAVENOUS | Status: DC | PRN
  Administered 2021-01-03: 50 mg via INTRAVENOUS

## 2021-01-03 MED ORDER — PROPOFOL 500 MG/50ML IV EMUL
INTRAVENOUS | Status: DC | PRN
  Administered 2021-01-03: 150 ug/kg/min via INTRAVENOUS

## 2021-01-03 MED ORDER — LACTATED RINGERS IV SOLN: INTRAVENOUS | Status: DC

## 2021-01-03 MED ORDER — PROPOFOL 10 MG/ML IV BOLUS
INTRAVENOUS | Status: DC | PRN
  Administered 2021-01-03: 100 mg via INTRAVENOUS

## 2021-01-03 MED ORDER — PROPOFOL 1000 MG/100ML IV EMUL
INTRAVENOUS | Status: AC
  Filled 2021-01-03: qty 200

## 2021-01-03 MED ORDER — STERILE WATER FOR IRRIGATION IR SOLN
Status: DC | PRN
  Administered 2021-01-03: 1.5 mL

## 2021-01-03 NOTE — Transfer of Care (Signed)
Immediate Anesthesia Transfer of Care Note  Patient: Mary Roman  Procedure(s) Performed: COLONOSCOPY WITH PROPOFOL (N/A )  Patient Location: PACU  Anesthesia Type:General  Level of Consciousness: awake, alert , oriented and patient cooperative  Airway & Oxygen Therapy: Patient Spontanous Breathing  Post-op Assessment: Report given to RN, Post -op Vital signs reviewed and stable and Patient moving all extremities X 4  Post vital signs: Reviewed and stable  Last Vitals:  Vitals Value Taken Time  BP 147/78 01/03/21 0811  Temp 36.9 C 01/03/21 0811  Pulse 103 01/03/21 0811  Resp 20 01/03/21 0811  SpO2 100 % 01/03/21 0811    Last Pain:  Vitals:   01/03/21 0811  TempSrc: Oral  PainSc: 0-No pain      Patients Stated Pain Goal: 8 (95/18/84 1660)  Complications: No complications documented.

## 2021-01-03 NOTE — Anesthesia Preprocedure Evaluation (Addendum)
Anesthesia Evaluation  Patient identified by MRN, date of birth, ID band Patient awake    Reviewed: Allergy & Precautions, NPO status , Patient's Chart, lab work & pertinent test results, reviewed documented beta blocker date and time   History of Anesthesia Complications Negative for: history of anesthetic complications  Airway Mallampati: II  TM Distance: >3 FB Neck ROM: Full    Dental  (+) Dental Advisory Given, Missing, Poor Dentition, Chipped Multiple chipped/broken teeth :   Pulmonary neg pulmonary ROS,    Pulmonary exam normal breath sounds clear to auscultation       Cardiovascular Exercise Tolerance: Good hypertension, Pt. on medications and Pt. on home beta blockers + Peripheral Vascular Disease  Normal cardiovascular exam Rhythm:Regular Rate:Normal     Neuro/Psych  Headaches, PSYCHIATRIC DISORDERS Anxiety Depression  Neuromuscular disease    GI/Hepatic negative GI ROS, Neg liver ROS,   Endo/Other  diabetes, Well Controlled, Type 2, Oral Hypoglycemic Agents  Renal/GU negative Renal ROS     Musculoskeletal  (+) Arthritis  (chronic right sided back pain),   Abdominal   Peds  Hematology  (+) anemia ,   Anesthesia Other Findings Hypokalemia   Reproductive/Obstetrics                           Anesthesia Physical Anesthesia Plan  ASA: II  Anesthesia Plan: General   Post-op Pain Management:    Induction: Intravenous  PONV Risk Score and Plan: Propofol infusion  Airway Management Planned: Nasal Cannula and Natural Airway  Additional Equipment:   Intra-op Plan:   Post-operative Plan:   Informed Consent: I have reviewed the patients History and Physical, chart, labs and discussed the procedure including the risks, benefits and alternatives for the proposed anesthesia with the patient or authorized representative who has indicated his/her understanding and acceptance.      Dental advisory given  Plan Discussed with: CRNA and Surgeon  Anesthesia Plan Comments:        Anesthesia Quick Evaluation

## 2021-01-03 NOTE — Op Note (Signed)
[facility] Patient Name: Mary Roman Procedure Date: 01/03/2021 7:11 AM MRN: [id number] Date of Birth: November 14, 1970 Attending MD: Maylon Peppers ,  CSN: [id number] Age: 50 Admit Type: Outpatient Procedure:                Colonoscopy Indications:              Screening for colorectal malignant neoplasm,                            Screening patient at increased risk: Family history                            of 1st-degree relative with colorectal cancer at                            age 34 years (or older) Providers:                Maylon Peppers, Caprice Kluver, Pringle. Risa Grill, Technician Referring MD:              Medicines:                Monitored Anesthesia Care Complications:            No immediate complications. Estimated Blood Loss:     Estimated blood loss: none. Procedure:                Pre-Anesthesia Assessment:                           - Prior to the procedure, a History and Physical                            was performed, and patient medications, allergies                            and sensitivities were reviewed. The patient's                            tolerance of previous anesthesia was reviewed.                           - The risks and benefits of the procedure and the                            sedation options and risks were discussed with the                            patient. All questions were answered and informed                            consent was obtained.                           - ASA Grade Assessment: II - A patient  with mild                            systemic disease.                           After obtaining informed consent, the colonoscope                            was passed under direct vision. Throughout the                            procedure, the patient's blood pressure, pulse, and                            oxygen saturations were monitored continuously. The                             [id number]-[id number] ([id number]) scope was introduced through                            the anus and advanced to the the cecum, identified                            by appendiceal orifice and ileocecal valve. The                            colonoscopy was performed without difficulty. The                            patient tolerated the procedure well. Scope In: 7:45:42 AM Scope Out: 8:06:12 AM Scope Withdrawal Time: 0 hours 12 minutes 59 seconds  Total Procedure Duration: 0 hours 20 minutes 30 seconds  Findings:      The perianal and digital rectal examinations were normal.      The colon (entire examined portion) appeared normal.      Non-bleeding hemorrhoids were found during retroflexion. The hemorrhoids       were medium-sized. Impression:               - The entire examined colon is normal.                           - Non-bleeding hemorrhoids.                           - No specimens collected. Moderate Sedation:      Per Anesthesia Care Recommendation:           - Discharge patient to home (ambulatory).                           - Resume previous diet.                           - Repeat colonoscopy in 10 years for screening  purposes. Procedure Code(s):        --- Professional ---                           H8527, Colorectal cancer screening; colonoscopy on                            individual at high risk Diagnosis Code(s):        --- Professional ---                           Z12.11, Encounter for screening for malignant                            neoplasm of colon                           Z80.0, Family history of malignant neoplasm of                            digestive organs                           K64.9, Unspecified hemorrhoids CPT copyright 2019 American Medical Association. All rights reserved. The codes documented in this report are preliminary and upon coder review may  be revised to meet current compliance requirements. Maylon Peppers,  MD Maylon Peppers,  01/03/2021 8:12:08 AM This report has been signed electronically. Number of Addenda: 0

## 2021-01-03 NOTE — Addendum Note (Signed)
Addendum  created 01/03/21 0904 by Jonna Munro, CRNA   Flowsheet accepted

## 2021-01-03 NOTE — Discharge Instructions (Signed)
You are being discharged to home.  Resume your previous diet.  Your physician has recommended a repeat colonoscopy in 10 years for screening purposes.    Colonoscopy, Adult, Care After This sheet gives you information about how to care for yourself after your procedure. Your doctor may also give you more specific instructions. If you have problems or questions, call your doctor. What can I expect after the procedure? After the procedure, it is common to have:  A small amount of blood in your poop (stool) for 24 hours.  Some gas.  Mild cramping or bloating in your belly (abdomen). Follow these instructions at home: Eating and drinking  Drink enough fluid to keep your pee (urine) pale yellow.  Follow instructions from your doctor about what you cannot eat or drink.  Return to your normal diet as told by your doctor. Avoid heavy or fried foods that are hard to digest.   Activity  Rest as told by your doctor.  Do not sit for a long time without moving. Get up to take short walks every 1-2 hours. This is important. Ask for help if you feel weak or unsteady.  Return to your normal activities as told by your doctor. Ask your doctor what activities are safe for you. To help cramping and bloating:  Try walking around.  Put heat on your belly as told by your doctor. Use the heat source that your doctor recommends, such as a moist heat pack or a heating pad. ? Put a towel between your skin and the heat source. ? Leave the heat on for 20-30 minutes. ? Remove the heat if your skin turns bright red. This is very important if you are unable to feel pain, heat, or cold. You may have a greater risk of getting burned.   General instructions  If you were given a medicine to help you relax (sedative) during your procedure, it can affect you for many hours. Do not drive or use machinery until your doctor says that it is safe.  For the first 24 hours after the procedure: ? Do not sign important  documents. ? Do not drink alcohol. ? Do your daily activities more slowly than normal. ? Eat foods that are soft and easy to digest.  Take over-the-counter or prescription medicines only as told by your doctor.  Keep all follow-up visits as told by your doctor. This is important. Contact a doctor if:  You have blood in your poop 2-3 days after the procedure. Get help right away if:  You have more than a small amount of blood in your poop.  You see large clumps of tissue (blood clots) in your poop.  Your belly is swollen.  You feel like you may vomit (nauseous).  You vomit.  You have a fever.  You have belly pain that gets worse, and medicine does not help your pain. Summary  After the procedure, it is common to have a small amount of blood in your poop. You may also have mild cramping and bloating in your belly.  If you were given a medicine to help you relax (sedative) during your procedure, it can affect you for many hours. Do not drive or use machinery until your doctor says that it is safe.  Get help right away if you have a lot of blood in your poop, feel like you may vomit, have a fever, or have more belly pain. This information is not intended to replace advice given to you by  your health care provider. Make sure you discuss any questions you have with your health care provider. Document Revised: 05/22/2019 Document Reviewed: 02/09/2019 Elsevier Patient Education  2021 [place].  Hemorrhoids Hemorrhoids are swollen veins that may develop:  In the butt (rectum). These are called internal hemorrhoids.  Around the opening of the butt (anus). These are called external hemorrhoids. Hemorrhoids can cause pain, itching, or bleeding. Most of the time, they do not cause serious problems. They usually get better with diet changes, lifestyle changes, and other home treatments. What are the causes? This condition may be caused by:  Having trouble pooping  (constipation).  Pushing hard (straining) to poop.  Watery poop (diarrhea).  Pregnancy.  Being very overweight (obese).  Sitting for long periods of time.  Heavy lifting or other activity that causes you to strain.  Anal sex.  Riding a bike for a long period of time. What are the signs or symptoms? Symptoms of this condition include:  Pain.  Itching or soreness in the butt.  Bleeding from the butt.  Leaking poop.  Swelling in the area.  One or more lumps around the opening of your butt. How is this diagnosed? A doctor can often diagnose this condition by looking at the affected area. The doctor may also:  Do an exam that involves feeling the area with a gloved hand (digital rectal exam).  Examine the area inside your butt using a small tube (anoscope).  Order blood tests. This may be done if you have lost a lot of blood.  Have you get a test that involves looking inside the colon using a flexible tube with a camera on the end (sigmoidoscopy or colonoscopy). How is this treated? This condition can usually be treated at home. Your doctor may tell you to change what you eat, make lifestyle changes, or try home treatments. If these do not help, procedures can be done to remove the hemorrhoids or make them smaller. These may involve:  Placing rubber bands at the base of the hemorrhoids to cut off their blood supply.  Injecting medicine into the hemorrhoids to shrink them.  Shining a type of light energy onto the hemorrhoids to cause them to fall off.  Doing surgery to remove the hemorrhoids or cut off their blood supply. Follow these instructions at home: Eating and drinking  Eat foods that have a lot of fiber in them. These include whole grains, beans, nuts, fruits, and vegetables.  Ask your doctor about taking products that have added fiber (fibersupplements).  Reduce the amount of fat in your diet. You can do this by: ? Eating low-fat dairy products. ? Eating  less red meat. ? Avoiding processed foods.  Drink enough fluid to keep your pee (urine) pale yellow.   Managing pain and swelling  Take a warm-water bath (sitz bath) for 20 minutes to ease pain. Do this 3-4 times a day. You may do this in a bathtub or using a portable sitz bath that fits over the toilet.  If told, put ice on the painful area. It may be helpful to use ice between your warm baths. ? Put ice in a plastic bag. ? Place a towel between your skin and the bag. ? Leave the ice on for 20 minutes, 2-3 times a day.   General instructions  Take over-the-counter and prescription medicines only as told by your doctor. ? Medicated creams and medicines may be used as told.  Exercise often. Ask your doctor how much and  what kind of exercise is best for you.  Go to the bathroom when you have the urge to poop. Do not wait.  Avoid pushing too hard when you poop.  Keep your butt dry and clean. Use wet toilet paper or moist towelettes after pooping.  Do not sit on the toilet for a long time.  Keep all follow-up visits as told by your doctor. This is important. Contact a doctor if you:  Have pain and swelling that do not get better with treatment or medicine.  Have trouble pooping.  Cannot poop.  Have pain or swelling outside the area of the hemorrhoids. Get help right away if you have:  Bleeding that will not stop. Summary  Hemorrhoids are swollen veins in the butt or around the opening of the butt.  They can cause pain, itching, or bleeding.  Eat foods that have a lot of fiber in them. These include whole grains, beans, nuts, fruits, and vegetables.  Take a warm-water bath (sitz bath) for 20 minutes to ease pain. Do this 3-4 times a day. This information is not intended to replace advice given to you by your health care provider. Make sure you discuss any questions you have with your health care provider. Document Revised: 07/24/2018 Document Reviewed:  12/05/2017 Elsevier Patient Education  [place].

## 2021-01-03 NOTE — Anesthesia Postprocedure Evaluation (Signed)
Anesthesia Post Note  Patient: Mary Roman  Procedure(s) Performed: COLONOSCOPY WITH PROPOFOL (N/A )  Patient location during evaluation: Phase II Anesthesia Type: General Level of consciousness: awake, awake and alert and patient cooperative Pain management: satisfactory to patient Vital Signs Assessment: post-procedure vital signs reviewed and stable Respiratory status: spontaneous breathing Cardiovascular status: stable Postop Assessment: no apparent nausea or vomiting and no headache Anesthetic complications: no   No complications documented.   Last Vitals:  Vitals:   01/03/21 0638 01/03/21 0811  BP: (!) 140/94 (!) 147/78  Pulse: (!) 109 (!) 103  Resp: 15 20  Temp: 36.6 C 36.9 C  SpO2: 100% 100%    Last Pain:  Vitals:   01/03/21 0811  TempSrc: Oral  PainSc: 0-No pain                 Lamoine Fredricksen

## 2021-01-03 NOTE — Addendum Note (Signed)
Addendum  created 01/03/21 0905 by Jonna Munro, CRNA   Charge Capture section accepted

## 2021-01-03 NOTE — H&P (Signed)
Mary Roman is an 50 y.o. female.   Chief Complaint: Colorectal cancer screening HPI: Mary Roman is a 50 y.o. female with PMH DM, HTN, anxiety, sickle cell trait, coming for screening colonoscopy. The patient has never had a colonoscopy in the past.  The patient denies having any complaints such as melena, hematochezia, abdominal pain or distention, change in her bowel movement consistency or frequency, no changes in her weight recently.  Patient reports that her mother had colon cancer diagnosed at age 77.   Past Medical History:  Diagnosis Date  . Absolute anemia 06/23/2018   Added automatically from request for surgery [id number]  . Anemia   . Anxiety   . Backache 12/24/2007  . Chronic back pain   . Diabetes mellitus, type II (Mary Roman)   . Headache(784.0)   . Hypertension   . Metabolic syndrome X 3/53/6144  . Obesity   . Palpitation   . Sickle cell trait (Mary Roman)   . Thyromegaly   . Unspecified vitamin D deficiency 02/19/2013    Past Surgical History:  Procedure Laterality Date  . DILATATION AND CURETTAGE/HYSTEROSCOPY WITH MINERVA N/A 03/25/2019   Procedure: DILATATION AND CURETTAGE/HYSTEROSCOPY WITH  ATTEMPTED MINERVA ENDOMETRIAL ABLATION;  Surgeon: Florian Buff, MD;  Location: [facility];  Service: Gynecology;  Laterality: N/A;  . None      Family History  Problem Relation Age of Onset  . Diabetes Mother   . Hypertension Mother   . Stroke Mother   . Colon cancer Mother   . Alcohol abuse Mother   . Cancer - Colon Mother 35  . Pneumonia Father   . Hypertension Sister   . Anxiety disorder Sister   . Hypertension Sister   . Leukemia Sister   . Diabetes Maternal Grandmother   . Hypertension Maternal Grandmother   . Hypertension Maternal Grandfather   . Heart attack Maternal Uncle    Social History:  reports that she has never smoked. She has never used smokeless tobacco. She reports that she does not drink alcohol and does not use drugs.  Allergies: No Known  Allergies  Medications Prior to Admission  Medication Sig Dispense Refill  . acetaminophen (TYLENOL) 500 MG tablet Take 1,000 mg by mouth every 6 (six) hours as needed (pain).    Marland Kitchen aspirin EC 81 MG tablet Take 1 tablet (81 mg total) by mouth daily. (Patient taking differently: Take 81 mg by mouth in the morning.) 90 tablet 3  . buPROPion (WELLBUTRIN XL) 300 MG 24 hr tablet Take 1 tablet (300 mg total) by mouth daily. (Patient taking differently: Take 300 mg by mouth daily with lunch.) 90 tablet 0  . glipiZIDE (GLUCOTROL XL) 10 MG 24 hr tablet TAKE TWO TABLETS BY MOUTH ONCE DAILY WITH BREAKFAST (Patient taking differently: Take 20 mg by mouth daily with lunch.) 180 tablet 1  . megestrol (MEGACE) 40 MG tablet TAKE 3 TABLETS BY MOUTH ONCE DAILY FOR 5 DAYS, THEN TAKE 2 TABLETS ONCE DAILY FOR 5 DAYS, THEN TAKE 1 TABLET ONCE DAILY. (Patient taking differently: Take 40 mg by mouth in the morning.) 45 tablet 5  . metoprolol tartrate (LOPRESSOR) 25 MG tablet TAKE 1 TABLET BY MOUTH DAILY. (Patient taking differently: Take 25 mg by mouth in the morning.) 90 tablet 0  . polyethylene glycol-electrolytes (TRILYTE) 420 g solution Take 4,000 mLs by mouth as directed. 4000 mL 0  . SitaGLIPtin-MetFORMIN HCl [phone] MG TB24 Take 1 tablet by mouth daily. (Patient taking differently: Take 1 tablet by mouth  daily with lunch.) 90 tablet 0  . temazepam (RESTORIL) 30 MG capsule Take 1 capsule (30 mg total) by mouth at bedtime as needed for sleep. 30 capsule 2  . triamterene-hydrochlorothiazide (MAXZIDE-25) 37.5-25 MG tablet TAKE 1 TABLET BY MOUTH DAILY. (Patient taking differently: Take 1 tablet by mouth in the morning.) 90 tablet 3  . Vitamin D, Ergocalciferol, (DRISDOL) 1.25 MG (50000 UNIT) CAPS capsule TAKE 1 CAPSULE (50,000 UNITS TOTAL) BY MOUTH ONCE A WEEK. (Patient taking differently: Take 50,000 Units by mouth every Monday.) 12 capsule 2  . blood glucose meter kit and supplies Dispense based on patient and insurance  preference. Once daily testing dx e11.9 1 each 0  . buPROPion (WELLBUTRIN XL) 150 MG 24 hr tablet Take 3 tablets (450 mg total) by mouth daily. 90 tablet 1  . rosuvastatin (CRESTOR) 5 MG tablet Take 1 tablet (5 mg total) by mouth daily. (Patient not taking: Reported on 12/29/2020) 90 tablet 3  . SitaGLIPtin-MetFORMIN HCl [phone] MG TB24 TAKE ONE TABLET BY MOUTH ONCE EVERY DAY (Patient not taking: Reported on 12/29/2020) 30 tablet 5  . [START ON 02/07/2021] venlafaxine XR (EFFEXOR-XR) 150 MG 24 hr capsule Take 1 capsule (150 mg total) by mouth daily along with 75 mg capsule to equal 225 mg total daily. 90 capsule 1  . [START ON 02/08/2021] venlafaxine XR (EFFEXOR-XR) 75 MG 24 hr capsule Take 1 capsule (75 mg total) by mouth daily with 150 mg capsule to equal 225 mg total daily. 90 capsule 1    Results for orders placed or performed during the hospital encounter of 01/03/21 (from the past 48 hour(s))  Pregnancy, urine     Status: None   Collection Time: 01/02/21  8:37 AM  Result Value Ref Range   Preg Test, Ur NEGATIVE NEGATIVE    Comment:        THE SENSITIVITY OF THIS METHODOLOGY IS >20 mIU/mL. Performed at [facility], [address], [place], [place] [address]   Basic metabolic panel     Status: Abnormal   Collection Time: 01/02/21  8:38 AM  Result Value Ref Range   Sodium 139 135 - 145 mmol/L   Potassium 2.8 (L) 3.5 - 5.1 mmol/L   Chloride 98 98 - 111 mmol/L   CO2 30 22 - 32 mmol/L   Glucose, Bld 239 (H) 70 - 99 mg/dL    Comment: Glucose reference range applies only to samples taken after fasting for at least 8 hours.   BUN 10 6 - 20 mg/dL   Creatinine, Ser 1.27 (H) 0.44 - 1.00 mg/dL   Calcium 9.3 8.9 - 10.3 mg/dL   GFR, Estimated 51 (L) >60 mL/min    Comment: (NOTE) Calculated using the CKD-EPI Creatinine Equation (2021)    Anion gap 11 5 - 15    Comment: Performed at [facility], [address] [place], [place] [address]  I-STAT, [place] 8     Status: Abnormal   Collection Time:  01/03/21  6:50 AM  Result Value Ref Range   Sodium 139 135 - 145 mmol/L   Potassium 3.1 (L) 3.5 - 5.1 mmol/L   Chloride 100 98 - 111 mmol/L   BUN 8 6 - 20 mg/dL   Creatinine, Ser 1.10 (H) 0.44 - 1.00 mg/dL   Glucose, Bld 214 (H) 70 - 99 mg/dL    Comment: Glucose reference range applies only to samples taken after fasting for at least 8 hours.   Calcium, Ion 1.11 (L) 1.15 - 1.40 mmol/L  TCO2 26 22 - 32 mmol/L   Hemoglobin 14.3 12.0 - 15.0 g/dL   HCT 42.0 36.0 - 46.0 %   No results found.  Review of Systems  Constitutional: Negative.   HENT: Negative.   Eyes: Negative.   Respiratory: Negative.   Cardiovascular: Negative.   Gastrointestinal: Negative.   Endocrine: Negative.   Genitourinary: Negative.   Musculoskeletal: Negative.   Skin: Negative.   Allergic/Immunologic: Negative.   Neurological: Negative.   Hematological: Negative.   Psychiatric/Behavioral: Negative.     Blood pressure (!) 140/94, pulse (!) 109, temperature 97.9 F (36.6 C), temperature source Oral, resp. rate 15, height 5' 5"  (1.651 m), weight 83.9 kg, SpO2 100 %. Physical Exam  GENERAL: The patient is AO x3, in no acute distress. HEENT: Head is normocephalic and atraumatic. EOMI are intact. Mouth is well hydrated and without lesions. NECK: Supple. No masses LUNGS: Clear to auscultation. No presence of rhonchi/wheezing/rales. Adequate chest expansion HEART: RRR, normal s1 and s2. ABDOMEN: Soft, nontender, no guarding, no peritoneal signs, and nondistended. BS +. No masses. EXTREMITIES: Without any cyanosis, clubbing, rash, lesions or edema. NEUROLOGIC: AOx3, no focal motor deficit. SKIN: no jaundice, no rashes   Assessment/Plan Mary Roman is a 50 y.o. female with PMH DM, HTN, anxiety, sickle cell trait, coming for screening colonoscopy.  We will proceed with colonoscopy today.  Harvel Quale, MD 01/03/2021, 7:32 AM

## 2021-01-05 ENCOUNTER — Other Ambulatory Visit (HOSPITAL_COMMUNITY): Payer: Self-pay

## 2021-01-05 ENCOUNTER — Other Ambulatory Visit: Payer: Self-pay | Admitting: Family Medicine

## 2021-01-05 MED ORDER — METOPROLOL TARTRATE 25 MG PO TABS
25.0000 mg | ORAL_TABLET | Freq: Every day | ORAL | 0 refills | Status: DC
  Filled 2021-01-05: qty 90, 90d supply, fill #0

## 2021-01-11 ENCOUNTER — Encounter (HOSPITAL_COMMUNITY): Payer: Self-pay | Admitting: Gastroenterology

## 2021-02-05 MED FILL — Triamterene & Hydrochlorothiazide Tab 37.5-25 MG: ORAL | 90 days supply | Qty: 90 | Fill #0 | Status: AC

## 2021-02-06 ENCOUNTER — Other Ambulatory Visit (HOSPITAL_COMMUNITY): Payer: Self-pay

## 2021-02-16 NOTE — Progress Notes (Deleted)
BH MD/PA/NP OP Progress Note  02/16/2021 3:27 PM Mary Roman  MRN:  [id number]  Chief Complaint:  HPI: *** Visit Diagnosis: No diagnosis found.  Past Psychiatric History: Please see initial evaluation for full details. I have reviewed the history. No updates at this time.     Past Medical History:  Past Medical History:  Diagnosis Date   Absolute anemia 06/23/2018   Added automatically from request for surgery [id number]   Anemia    Anxiety    Backache 12/24/2007   Chronic back pain    Diabetes mellitus, type II ([place])    Headache(784.0)    Hypertension    Metabolic syndrome X 8/47/2072   Obesity    Palpitation    Sickle cell trait ([place])    Thyromegaly    Unspecified vitamin D deficiency 02/19/2013    Past Surgical History:  Procedure Laterality Date   COLONOSCOPY WITH PROPOFOL N/A 01/03/2021   Procedure: COLONOSCOPY WITH PROPOFOL;  Surgeon: Harvel Quale, MD;  Location[facility];  Service: Gastroenterology;  Laterality: N/A;  AM   DILATATION AND CURETTAGE/HYSTEROSCOPY WITH MINERVA N/A 03/25/2019   Procedure: DILATATION AND CURETTAGE/HYSTEROSCOPY WITH  ATTEMPTED MINERVA ENDOMETRIAL ABLATION;  Surgeon: Florian Buff, MD;  Location: [facility];  Service: Gynecology;  Laterality: N/A;   None      Family Psychiatric History: Please see initial evaluation for full details. I have reviewed the history. No updates at this time.     Family History:  Family History  Problem Relation Age of Onset   Diabetes Mother    Hypertension Mother    Stroke Mother    Colon cancer Mother    Alcohol abuse Mother    Cancer - Colon Mother 46   Pneumonia Father    Hypertension Sister    Anxiety disorder Sister    Hypertension Sister    Leukemia Sister    Diabetes Maternal Grandmother    Hypertension Maternal Grandmother    Hypertension Maternal Grandfather    Heart attack Maternal Uncle     Social History:  Social History   Socioeconomic History   Marital status:  Single    Spouse name: Not on file   Number of children: 2   Years of education: Not on file   Highest education level: Not on file  Occupational History   Occupation: employed in medical office   Tobacco Use   Smoking status: Never   Smokeless tobacco: Never  Vaping Use   Vaping Use: Never used  Substance and Sexual Activity   Alcohol use: No   Drug use: No   Sexual activity: Yes    Partners: Male    Birth control/protection: Other-see comments    Comment: bf had vasectomy  Other Topics Concern   Not on file  Social History Narrative   Not on file   Social Determinants of Health   Financial Resource Strain: Not on file  Food Insecurity: Not on file  Transportation Needs: Not on file  Physical Activity: Not on file  Stress: Not on file  Social Connections: Not on file    Allergies: No Known Allergies  Metabolic Disorder Labs: Lab Results  Component Value Date   HGBA1C 7.1 (H) 12/14/2020   MPG 200.12 04/18/2020   MPG 206 01/13/2020   No results found for: PROLACTIN Lab Results  Component Value Date   CHOL 122 09/01/2020   TRIG 88 09/01/2020   HDL 25 (L) 09/01/2020   CHOLHDL 4.9 (H) 09/01/2020  VLDL 12 04/18/2020   LDLCALC 80 09/01/2020   LDLCALC 84 04/18/2020   Lab Results  Component Value Date   TSH 1.400 09/01/2020   TSH 1.916 05/13/2019    Therapeutic Level Labs: No results found for: LITHIUM No results found for: VALPROATE No components found for:  CBMZ  Current Medications: Current Outpatient Medications  Medication Sig Dispense Refill   acetaminophen (TYLENOL) 500 MG tablet Take 1,000 mg by mouth every 6 (six) hours as needed (pain).     aspirin EC 81 MG tablet Take 1 tablet (81 mg total) by mouth daily. (Patient taking differently: Take 81 mg by mouth in the morning.) 90 tablet 3   blood glucose meter kit and supplies Dispense based on patient and insurance preference. Once daily testing dx e11.9 1 each 0   buPROPion (WELLBUTRIN XL) 150 MG  24 hr tablet Take 3 tablets (450 mg total) by mouth daily. 90 tablet 1   buPROPion (WELLBUTRIN XL) 300 MG 24 hr tablet Take 1 tablet (300 mg total) by mouth daily. (Patient taking differently: Take 300 mg by mouth daily with lunch.) 90 tablet 0   glipiZIDE (GLUCOTROL XL) 10 MG 24 hr tablet TAKE TWO TABLETS BY MOUTH ONCE DAILY WITH BREAKFAST (Patient taking differently: Take 20 mg by mouth daily with lunch.) 180 tablet 1   megestrol (MEGACE) 40 MG tablet TAKE 3 TABLETS BY MOUTH ONCE DAILY FOR 5 DAYS, THEN TAKE 2 TABLETS ONCE DAILY FOR 5 DAYS, THEN TAKE 1 TABLET ONCE DAILY. (Patient taking differently: Take 40 mg by mouth in the morning.) 45 tablet 5   metoprolol tartrate (LOPRESSOR) 25 MG tablet Take 1 tablet (25 mg total) by mouth daily. 90 tablet 0   rosuvastatin (CRESTOR) 5 MG tablet Take 1 tablet (5 mg total) by mouth daily. (Patient not taking: Reported on 12/29/2020) 90 tablet 3   SitaGLIPtin-MetFORMIN HCl [phone] MG TB24 TAKE ONE TABLET BY MOUTH ONCE EVERY DAY (Patient not taking: Reported on 12/29/2020) 30 tablet 5   SitaGLIPtin-MetFORMIN HCl [phone] MG TB24 Take 1 tablet by mouth daily. (Patient taking differently: Take 1 tablet by mouth daily with lunch.) 90 tablet 0   temazepam (RESTORIL) 30 MG capsule Take 1 capsule (30 mg total) by mouth at bedtime as needed for sleep. 30 capsule 2   triamterene-hydrochlorothiazide (MAXZIDE-25) 37.5-25 MG tablet TAKE 1 TABLET BY MOUTH DAILY. (Patient taking differently: Take 1 tablet by mouth in the morning.) 90 tablet 3   venlafaxine XR (EFFEXOR-XR) 150 MG 24 hr capsule Take 1 capsule (150 mg total) by mouth daily along with 75 mg capsule to equal 225 mg total daily. 90 capsule 1   venlafaxine XR (EFFEXOR-XR) 75 MG 24 hr capsule Take 1 capsule (75 mg total) by mouth daily with 150 mg capsule to equal 225 mg total daily. 90 capsule 1   Vitamin D, Ergocalciferol, (DRISDOL) 1.25 MG (50000 UNIT) CAPS capsule TAKE 1 CAPSULE (50,000 UNITS TOTAL) BY MOUTH ONCE A  WEEK. (Patient taking differently: Take 50,000 Units by mouth every Monday.) 12 capsule 2   No current facility-administered medications for this visit.     Musculoskeletal: Strength & Muscle Tone:  N/A Gait & Station:  N/A Patient leans: N/A  Psychiatric Specialty Exam: Review of Systems  There were no vitals taken for this visit.There is no height or weight on file to calculate BMI.  General Appearance: {Appearance:22683}  Eye Contact:  {[facility] EYE CONTACT:22684}  Speech:  Clear and Coherent  Volume:  Normal  Mood:  {  [place] LKJZ:79150}  Affect:  {Affect (PAA):22687}  Thought Process:  Coherent  Orientation:  Full (Time, Place, and Person)  Thought Content: Logical   Suicidal Thoughts:  {ST/HT (PAA):22692}  Homicidal Thoughts:  {ST/HT (PAA):22692}  Memory:  Immediate;   Good  Judgement:  {Judgement (PAA):22694}  Insight:  {Insight (PAA):22695}  Psychomotor Activity:  Normal  Concentration:  Concentration: Good and Attention Span: Good  Recall:  Good  Fund of Knowledge: Good  Language: Good  Akathisia:  No  Handed:  Right  AIMS (if indicated): not done  Assets:  Communication Skills Desire for Improvement  ADL's:  Intact  Cognition: WNL  Sleep:  {[facility] GOOD/FAIR/POOR:22877}   Screenings: GAD-7    Flowsheet Row Counselor from 05/15/2018 in [place] Office Visit from 01/08/2017 in [facility]  Total GAD-7 Score 17 14      PHQ2-9    [place] Office Visit from 12/20/2020 in [facility] Video Visit from 11/28/2020 in [place] Video Visit from 11/08/2020 in [place] Nutrition from 10/21/2020 in [facility] Video Visit from 08/31/2020 in [facility]  PHQ-2 Total Score _0 0 0  PHQ-9 Total Score -- 6 9 -- --      Flowsheet Row Admission (Discharged) from 01/03/2021 in [place] Video  Visit from 11/28/2020 in [place] Video Visit from 11/08/2020 in [place] Error: Question 6 not populated No Risk No Risk        Assessment and Plan:  Mary Roman is a 50 y.o. year old female with a history of depression, diabetes, who presents for follow up appointment for below.     1. MDD (major depressive disorder), recurrent episode, mild (HCC) There has been gradual improvement in depressive symptoms since she consistently restarting her medication.  We do further up titration of bupropion to optimize treatment for depression.  Will continue venlafaxine to target depression.  Coached behavioral activation.  She will greatly benefit from CBT; she is advised to make a follow-up appointment was Ms. Bynum.    3. Insomnia, unspecified type She continues to have prominent fatigue, middle insomnia and snoring.  She was referred for sleep evaluation.    She is planning to contact the clinic after colonoscopy is done.    Plan 1. Continue venlafaxine 225 mg daily  2. Continue bupropion 300 mg daily 3. Next appointment: 7/27 at 8:20 for 20  mins, video -on  temazepam 30 mg at night as needed for sleep - on gabapentin for pain and hydroxyzine   Emergency resources which includes 911, ED, suicide crisis line [phone]) are discussed.    Past trials of medication: fluoxetine, duloxetine, Abilify (may consider restarting this),  buspirone, Trazodone ("weird" feeling) temazepam, Xanax   The patient demonstrates the following risk factors for suicide: Chronic risk factors for suicide include: psychiatric disorder of depression, anxiety and chronic pain. Acute risk factors for suicide include: N/A. Protective factors for this patient include: positive social support, responsibility to others (children, family), coping skills and hope for the future. Considering these factors, the overall suicide risk at  this point appears to be low. Patient is appropriate for outpatient follow up.          [name], MD 02/16/2021, 3:27 PM

## 2021-02-22 ENCOUNTER — Telehealth: Payer: No Typology Code available for payment source | Admitting: Psychiatry

## 2021-03-01 ENCOUNTER — Other Ambulatory Visit (HOSPITAL_COMMUNITY): Payer: Self-pay

## 2021-03-01 NOTE — Progress Notes (Signed)
Virtual Visit via Video Note  I connected with Mary Roman on 03/06/21 at  4:40 PM EDT by a video enabled telemedicine application and verified that I am speaking with the correct person using two identifiers.  Location: Patient: home Provider: office Persons participated in the visit- patient, provider    I discussed the limitations of evaluation and management by telemedicine and the availability of in person appointments. The patient expressed understanding and agreed to proceed.      I discussed the assessment and treatment plan with the patient. The patient was provided an opportunity to ask questions and all were answered. The patient agreed with the plan and demonstrated an understanding of the instructions.   The patient was advised to call back or seek an in-person evaluation if the symptoms worsen or if the condition fails to improve as anticipated.  I provided 11 minutes of non-face-to-face time during this encounter.   [name], MD     [facility] MD/PA/NP OP Progress Note  03/06/2021 5:06 PM Mary Roman  MRN:  [id number]  Chief Complaint:  Chief Complaint   Follow-up; Depression    HPI:  This is a follow-up appointment for depression.  She states that she is doing a new shift at work as somebody left.  She has been trying to adjust to the schedule, and it has been good so far.  She talks about her son, who was admitted due to depression and an SI a week ago.  She is concerned about him, although he has been getting better.  She tends to stay in the bed when she does not work.  She does not want to drive.  She feels stressed about "life in general."  She feels that she is not doing good enough or not productive.  She is willing to try higher dose of bupropion at this time.  She sleeps 6 to 7 hours.  She has decrease in appetite, and has lost a few pounds.  She has difficulty in concentration.  She denies SI.  She feels anxious and tense.  She thinks it is  more feasible to have a therapy appointment, and is willing to reach out to her.    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  one son, one daughter  Visit Diagnosis:    ICD-10-CM   1. MDD (major depressive disorder), recurrent episode, mild ([place])  F33.0       Past Psychiatric History: Please see initial evaluation for full details. I have reviewed the history. No updates at this time.     Past Medical History:  Past Medical History:  Diagnosis Date   Absolute anemia 06/23/2018   Added automatically from request for surgery [id number]   Anemia    Anxiety    Backache 12/24/2007   Chronic back pain    Diabetes mellitus, type II ([place])    Headache(784.0)    Hypertension    Metabolic syndrome X 04/12/7828   Obesity    Palpitation    Sickle cell trait ([place])    Thyromegaly    Unspecified vitamin D deficiency 02/19/2013    Past Surgical History:  Procedure Laterality Date   COLONOSCOPY WITH PROPOFOL N/A 01/03/2021   Procedure: COLONOSCOPY WITH PROPOFOL;  Surgeon:  Harvel Quale, MD;  [facility];  Service: Gastroenterology;  Laterality: N/A;  AM   DILATATION AND CURETTAGE/HYSTEROSCOPY WITH MINERVA N/A 03/25/2019   Procedure: DILATATION AND CURETTAGE/HYSTEROSCOPY WITH  ATTEMPTED MINERVA ENDOMETRIAL ABLATION;  Surgeon: Florian Buff, MD;  Location: [facility];  Service: Gynecology;  Laterality: N/A;   None      Family Psychiatric History: Please see initial evaluation for full details. I have reviewed the history. No updates at this time.     Family History:  Family History  Problem Relation Age of Onset   Diabetes Mother    Hypertension Mother    Stroke Mother    Colon cancer Mother    Alcohol abuse Mother     Cancer - Colon Mother 67   Pneumonia Father    Hypertension Sister    Anxiety disorder Sister    Hypertension Sister    Leukemia Sister    Diabetes Maternal Grandmother    Hypertension Maternal Grandmother    Hypertension Maternal Grandfather    Heart attack Maternal Uncle     Social History:  Social History   Socioeconomic History   Marital status: Single    Spouse name: Not on file   Number of children: 2   Years of education: Not on file   Highest education level: Not on file  Occupational History   Occupation: employed in medical office   Tobacco Use   Smoking status: Never   Smokeless tobacco: Never  Vaping Use   Vaping Use: Never used  Substance and Sexual Activity   Alcohol use: No   Drug use: No   Sexual activity: Yes    Partners: Male    Birth control/protection: Other-see comments    Comment: bf had vasectomy  Other Topics Concern   Not on file  Social History Narrative   Not on file   Social Determinants of Health   Financial Resource Strain: Not on file  Food Insecurity: Not on file  Transportation Needs: Not on file  Physical Activity: Not on file  Stress: Not on file  Social Connections: Not on file    Allergies: No Known Allergies  Metabolic Disorder Labs: Lab Results  Component Value Date   HGBA1C 7.1 (H) 12/14/2020   MPG 200.12 04/18/2020   MPG 206 01/13/2020   No results found for: PROLACTIN Lab Results  Component Value Date   CHOL 122 09/01/2020   TRIG 88 09/01/2020   HDL 25 (L) 09/01/2020   CHOLHDL 4.9 (H) 09/01/2020   VLDL 12 04/18/2020   LDLCALC 80 09/01/2020   LDLCALC 84 04/18/2020   Lab Results  Component Value Date   TSH 1.400 09/01/2020   TSH 1.916 05/13/2019    Therapeutic Level Labs: No results found for: LITHIUM No results found for: VALPROATE No components found for:  CBMZ  Current Medications: Current Outpatient Medications  Medication Sig Dispense Refill   acetaminophen (TYLENOL) 500 MG tablet Take  1,000 mg by mouth every 6 (six) hours as needed (pain).     aspirin EC 81 MG tablet Take 1 tablet (81 mg total) by mouth daily. (Patient taking differently: Take 81 mg by mouth in the morning.) 90 tablet 3   blood glucose meter kit and supplies Dispense based on patient and insurance preference. Once daily testing dx e11.9 1 each 0   buPROPion (WELLBUTRIN XL) 150 MG 24 hr tablet Take 3 tablets (450 mg total) by mouth daily. 270 tablet 0   glipiZIDE (GLUCOTROL XL) 10  MG 24 hr tablet TAKE TWO TABLETS BY MOUTH ONCE DAILY WITH BREAKFAST (Patient taking differently: Take 20 mg by mouth daily with lunch.) 180 tablet 1   megestrol (MEGACE) 40 MG tablet TAKE 3 TABLETS BY MOUTH ONCE DAILY FOR 5 DAYS, THEN TAKE 2 TABLETS ONCE DAILY FOR 5 DAYS, THEN TAKE 1 TABLET ONCE DAILY. (Patient taking differently: Take 40 mg by mouth in the morning.) 45 tablet 5   metoprolol tartrate (LOPRESSOR) 25 MG tablet Take 1 tablet (25 mg total) by mouth daily. 90 tablet 0   rosuvastatin (CRESTOR) 5 MG tablet Take 1 tablet (5 mg total) by mouth daily. (Patient not taking: Reported on 12/29/2020) 90 tablet 3   SitaGLIPtin-MetFORMIN HCl [phone] MG TB24 TAKE ONE TABLET BY MOUTH ONCE EVERY DAY (Patient not taking: Reported on 12/29/2020) 30 tablet 5   SitaGLIPtin-MetFORMIN HCl [phone] MG TB24 Take 1 tablet by mouth daily. (Patient taking differently: Take 1 tablet by mouth daily with lunch.) 90 tablet 0   temazepam (RESTORIL) 30 MG capsule Take 1 capsule (30 mg total) by mouth at bedtime as needed for sleep. 30 capsule 2   triamterene-hydrochlorothiazide (MAXZIDE-25) 37.5-25 MG tablet TAKE 1 TABLET BY MOUTH DAILY. (Patient taking differently: Take 1 tablet by mouth in the morning.) 90 tablet 3   venlafaxine XR (EFFEXOR-XR) 150 MG 24 hr capsule Take 1 capsule (150 mg total) by mouth daily along with 75 mg capsule to equal 225 mg total daily. 90 capsule 1   venlafaxine XR (EFFEXOR-XR) 75 MG 24 hr capsule Take 1 capsule (75 mg total) by mouth  daily with 150 mg capsule to equal 225 mg total daily. 90 capsule 1   Vitamin D, Ergocalciferol, (DRISDOL) 1.25 MG (50000 UNIT) CAPS capsule TAKE 1 CAPSULE (50,000 UNITS TOTAL) BY MOUTH ONCE A WEEK. (Patient taking differently: Take 50,000 Units by mouth every Monday.) 12 capsule 2   No current facility-administered medications for this visit.     Musculoskeletal: Strength & Muscle Tone:  N/A Gait & Station:  N/A Patient leans: N/A  Psychiatric Specialty Exam: Review of Systems  Psychiatric/Behavioral:  Positive for decreased concentration and dysphoric mood. Negative for agitation, behavioral problems, confusion, hallucinations, self-injury, sleep disturbance and suicidal ideas. The patient is nervous/anxious. The patient is not hyperactive.   All other systems reviewed and are negative.  There were no vitals taken for this visit.There is no height or weight on file to calculate BMI.  General Appearance: Fairly Groomed  Eye Contact:  Good  Speech:  Clear and Coherent  Volume:  Normal  Mood:  Depressed  Affect:  Appropriate, Congruent, and down  Thought Process:  Coherent  Orientation:  Full (Time, Place, and Person)  Thought Content: Logical   Suicidal Thoughts:  No  Homicidal Thoughts:  No  Memory:  Immediate;   Good  Judgement:  Good  Insight:  Fair  Psychomotor Activity:  Normal  Concentration:  Concentration: Good and Attention Span: Good  Recall:  Good  Fund of Knowledge: Good  Language: Good  Akathisia:  No  Handed:  Right  AIMS (if indicated): not done  Assets:  Communication Skills Desire for Improvement  ADL's:  Intact  Cognition: WNL  Sleep:  Fair   Screenings: GAD-7    Health and safety inspector from 05/15/2018 in [place] Office Visit from 01/08/2017 in [facility]  Total GAD-7 Score 17 14      PHQ2-9    [place] Office Visit from 12/20/2020 in [facility] Video Visit from  11/28/2020 in [place] Video Visit from 11/08/2020 in [place] Nutrition from 10/21/2020 in [facility] Video Visit from 08/31/2020 in [facility]  PHQ-2 Total Score 1 2 2  0 0  PHQ-9 Total Score -- 6 9 -- --      Flowsheet Row Admission (Discharged) from 01/03/2021 in [place] Video Visit from 11/28/2020 in [place] Video Visit from 11/08/2020 in [place] Error: Question 6 not populated No Risk No Risk        Assessment and Plan:  Mary Roman is a 50 y.o. year old female with a history of depression, diabetes, who presents for follow up appointment for below.   1. MDD (major depressive disorder), recurrent episode, mild ([place]) She continues to report depressive symptoms since the last visit.  Will do up titration of bupropion to optimize treatment for depression.  Will continue venlafaxine to target depression.  Noted that although she will benefit from [place], she declined this due to difficulty in transportation.  Coached behavioral activation.  She is advised again to reach out to Ms. Bynum for follow-up.    2. Insomnia, unspecified type She continues to have prominent fatigue, middle insomnia and snoring.  She was referred for sleep evaluation.  She was advised again to contact the clinic to make an appointment. Plan 1. Continue venlafaxine 225 mg daily  2. Increase bupropion 450 mg daily 3. Next appointment: 10/6 at 4:30 for 30 mins, video  -on  temazepam 30 mg at night as needed for sleep - on gabapentin for pain and hydroxyzine   Past trials of medication: fluoxetine, duloxetine, Abilify (may consider restarting this),  buspirone, Trazodone ("weird" feeling) temazepam, Xanax   The patient demonstrates the following risk factors for suicide: Chronic risk factors for suicide  include: psychiatric disorder of depression, anxiety and chronic pain. Acute risk factors for suicide include: N/A. Protective factors for this patient include: positive social support, responsibility to others (children, family), coping skills and hope for the future. Considering these factors, the overall suicide risk at this point appears to be low. Patient is appropriate for outpatient follow up.       [name], MD 03/06/2021, 5:06 PM

## 2021-03-06 ENCOUNTER — Other Ambulatory Visit: Payer: Self-pay

## 2021-03-06 ENCOUNTER — Telehealth (INDEPENDENT_AMBULATORY_CARE_PROVIDER_SITE_OTHER): Payer: No Typology Code available for payment source | Admitting: Psychiatry

## 2021-03-06 ENCOUNTER — Encounter: Payer: Self-pay | Admitting: Psychiatry

## 2021-03-06 ENCOUNTER — Other Ambulatory Visit (HOSPITAL_COMMUNITY): Payer: Self-pay

## 2021-03-06 DIAGNOSIS — F330 Major depressive disorder, recurrent, mild: Secondary | ICD-10-CM | POA: Diagnosis not present

## 2021-03-06 MED ORDER — BUPROPION HCL ER (XL) 150 MG PO TB24
450.0000 mg | ORAL_TABLET | Freq: Every day | ORAL | 0 refills | Status: DC
  Filled 2021-03-06: qty 270, 90d supply, fill #0

## 2021-03-06 NOTE — Patient Instructions (Signed)
1. Continue venlafaxine 225 mg daily  2. Increase bupropion 450 mg daily 3. Next appointment: 10/6 at 4:30

## 2021-03-07 ENCOUNTER — Other Ambulatory Visit: Payer: Self-pay | Admitting: Nurse Practitioner

## 2021-03-07 DIAGNOSIS — F5104 Psychophysiologic insomnia: Secondary | ICD-10-CM

## 2021-03-08 ENCOUNTER — Other Ambulatory Visit: Payer: Self-pay

## 2021-03-08 ENCOUNTER — Other Ambulatory Visit (HOSPITAL_COMMUNITY): Payer: Self-pay

## 2021-03-08 ENCOUNTER — Telehealth: Payer: Self-pay | Admitting: Family Medicine

## 2021-03-08 DIAGNOSIS — F5104 Psychophysiologic insomnia: Secondary | ICD-10-CM

## 2021-03-08 MED ORDER — GLIPIZIDE ER 10 MG PO TB24
20.0000 mg | ORAL_TABLET | Freq: Every day | ORAL | 1 refills | Status: DC
  Filled 2021-03-08: qty 180, 90d supply, fill #0
  Filled 2021-08-13: qty 180, 90d supply, fill #1

## 2021-03-08 MED ORDER — TEMAZEPAM 30 MG PO CAPS: 30.0000 mg | ORAL_CAPSULE | Freq: Every evening | ORAL | 2 refills | Status: DC | PRN

## 2021-03-08 NOTE — Telephone Encounter (Signed)
Please call in Glipizide & Temazepam for the pt

## 2021-03-08 NOTE — Telephone Encounter (Signed)
Rxs sent

## 2021-03-09 ENCOUNTER — Telehealth: Payer: Self-pay

## 2021-03-09 NOTE — Telephone Encounter (Signed)
Patient called did not receive this medicine.   temazepam (RESTORIL) 30 MG capsule  [facility]

## 2021-03-10 ENCOUNTER — Telehealth: Payer: Self-pay

## 2021-03-10 ENCOUNTER — Other Ambulatory Visit: Payer: Self-pay | Admitting: Nurse Practitioner

## 2021-03-10 ENCOUNTER — Other Ambulatory Visit: Payer: Self-pay

## 2021-03-10 ENCOUNTER — Other Ambulatory Visit (HOSPITAL_COMMUNITY): Payer: Self-pay

## 2021-03-10 DIAGNOSIS — F5104 Psychophysiologic insomnia: Secondary | ICD-10-CM

## 2021-03-10 MED ORDER — TEMAZEPAM 30 MG PO CAPS: 30.0000 mg | ORAL_CAPSULE | Freq: Every evening | ORAL | 2 refills | Status: DC | PRN

## 2021-03-10 NOTE — Telephone Encounter (Signed)
Sent to [facility]

## 2021-03-10 NOTE — Telephone Encounter (Signed)
Can we send patients rx for temazepam to [facility] in  instead of [facility]

## 2021-03-10 NOTE — Telephone Encounter (Signed)
Pt.notified

## 2021-03-10 NOTE — Telephone Encounter (Signed)
Pt message was sent to [facility]

## 2021-03-28 ENCOUNTER — Ambulatory Visit (HOSPITAL_COMMUNITY): Payer: No Typology Code available for payment source | Admitting: Psychiatry

## 2021-04-11 ENCOUNTER — Encounter (HOSPITAL_COMMUNITY): Payer: Self-pay | Admitting: Psychiatry

## 2021-04-11 ENCOUNTER — Ambulatory Visit (INDEPENDENT_AMBULATORY_CARE_PROVIDER_SITE_OTHER): Payer: No Typology Code available for payment source | Admitting: Psychiatry

## 2021-04-11 ENCOUNTER — Other Ambulatory Visit: Payer: Self-pay

## 2021-04-11 DIAGNOSIS — F331 Major depressive disorder, recurrent, moderate: Secondary | ICD-10-CM | POA: Diagnosis not present

## 2021-04-11 NOTE — Progress Notes (Signed)
Virtual Visit via Telephone Note  I connected with Mary Roman on 04/11/21 at  3:00 PM EDT by telephone and verified that I am speaking with the correct person using two identifiers.  Location: Patient: Home Provider: [place] office    I discussed the limitations, risks, security and privacy concerns of performing an evaluation and management service by telephone and the availability of in person appointments. I also discussed with the patient that there may be a patient responsible charge related to this service. The patient expressed understanding and agreed to proceed.     I provided 55 minutes of non-face-to-face time during this encounter.   [name], LCSW      Comprehensive Clinical Assessment (CCA) Note  04/11/2021 Mary Roman [id number]  Chief Complaint: Depression, stress Visit Diagnosis: MDD, recurrent, moderate     CCA Biopsychosocial Intake/Chief Complaint:  " Dr. Modesta Messing recommended I return to therapy, We haven't been able to find the right medication cocktail - I still feel depressed, anxious, feel hopeless and down all the time. I can barely make it to work. My family dynamics are stressful including issues with my son and daughter. Son is suffering from depression. My daughter internalizes alot. .  Current Symptoms/Problems: depressed mood, feels anxious, difficulty getting out of bed, difficulty falling asleep, worry alot,excessive sleeping   Patient Reported Schizophrenia/Schizoaffective Diagnosis in Past: No   Strengths: hard worker, articualte  Preferences: Individual therapy  Abilities: No data recorded  Type of Services Patient Feels are Needed: individual therapy / I want to get back to where I was before - at least get up and be able to go to work without problems, be able to go to church, do things on the weekend, handle my everyday issues without feeling like I am going to have a meltdown (crying,  isolating self from everybody)   Initial Clinical Notes/Concerns: Patient initially is referred for services by psychiatrist Dr. Modesta Messing. Patient reports experiencing symptoms of depression intermittently for the past 7-8  years. She reports participating in therapy twice with [facility] counselor about 2 years ago. She reports no psychiatric hospitalizatiions. She began taking psychotropic mediciation about 6 years ago as prescribed by PCP. She is a returning patient to this clinician and last was seen in 2020.   Mental Health Symptoms Depression:   Change in energy/activity; Difficulty Concentrating; Hopelessness; Increase/decrease in appetite; Irritability; Sleep (too much or little); Tearfulness; Weight gain/loss; Worthlessness; Fatigue   Duration of Depressive symptoms:  Greater than two weeks   Mania:   Irritability; Racing thoughts   Anxiety:    Difficulty concentrating; Fatigue; Irritability; Restlessness; Sleep; Tension; Worrying   Psychosis:   None   Duration of Psychotic symptoms: No data recorded  Trauma:   None   Obsessions:   N/A   Compulsions:   N/A   Inattention:   N/A   Hyperactivity/Impulsivity:   N/A   Oppositional/Defiant Behaviors:   N/A   Emotional Irregularity:   N/A   Other Mood/Personality Symptoms:  No data recorded   Mental Status Exam Appearance and self-care  Stature:   Average   Weight:   Overweight   Clothing:   Casual   Grooming:   Normal   Cosmetic use:   None   Posture/gait:   Normal   Motor activity:   Not Remarkable   Sensorium  Attention:   Normal   Concentration:   Normal   Orientation:   X5   Recall/memory:   Normal  Affect and Mood  Affect:   Depressed   Mood:   Depressed; Anxious   Relating  Eye contact:  No data recorded  Facial expression:   Responsive   Attitude toward examiner:   Cooperative   Thought and Language  Speech flow:  Soft   Thought content:   Appropriate to Mood and  Circumstances   Preoccupation:   Ruminations   Hallucinations:   None (None)   Organization:  No data recorded  [employer] of Knowledge:   Good   Intelligence:   Average   Abstraction:   Normal   Judgement:   Normal   Reality Testing:   Realistic   Insight:   Good   Decision Making:   Normal   Social Functioning  Social Maturity:   Isolates   Social Judgement:   Normal   Stress  Stressors:   Illness; Other (Comment); Financial (concerns regarding children, mother's illness)   Coping Ability:   Overwhelmed; Exhausted   Skill Deficits:  No data recorded  Supports:   Family     Religion: Religion/Spirituality Are You A Religious Person?: Yes What is Your Religious Affiliation?: Baptist How Might This Affect Treatment?: no effect  Leisure/Recreation: Leisure / Recreation Do You Have Hobbies?: No  Exercise/Diet: Exercise/Diet Do You Exercise?: No Have You Gained or Lost A Significant Amount of Weight in the Past Six Months?: Yes-Lost Number of Pounds Lost?: 20 Do You Follow a Special Diet?: No Do You Have Any Trouble Sleeping?: Yes Explanation of Sleeping Difficulties: Difficulty falling and staying asleep, sleeps excessively on the weekends   CCA Employment/Education Employment/Work Situation: Employment / Work Situation Employment Situation: Employed Where is Patient Currently Employed?: [employer] How Long has Patient Been Employed?: 11 years Are You Satisfied With Your Job?: Yes Do You Work More Than One Job?: No Patient's Job has Been Impacted by Current Illness: Yes Describe how Patient's Job has Been Impacted: poor concentration, What is the Longest Time Patient has Held a Job?: 13 years 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 Has Patient ever Been in the [employer]?: No  Education: Education Did Teacher, adult education From [employer]?: Yes Did [employer]?: Yes  ([employer] in [place], Geophysicist/field seismologist) Did You Have Any Chief Technology Officer In School?: [employer] Did You Have An Individualized Education Program (IIEP): No Did You Have Any Difficulty At School?: No Patient's Education Has Been Impacted by Current Illness: No   CCA Family/Childhood History Family and Relationship History: Family history Marital status: Long term relationship Long term relationship, how long?: 17 years What types of issues is patient dealing with in the relationship?: impact of my illness (depression), I wish he understood more of what it is like Are you sexually active?: Yes What is your sexual orientation?: heterosexual Has your sexual activity been affected by drugs, alcohol, medication, or emotional stress?: emotional stress Does patient have children?: Yes How many children?: 2 (son is 98 yo, daughter is 65) How is patient's relationship with their children?: fair, son  Childhood History:  Childhood History By whom was/is the patient raised?: Mother (Parents separated when patient was an infant and father died when she was really young. ) Additional childhood history information: Patient was born and raised in [place].  Description of patient's relationship with caregiver when they were a child: I am the youngest of 5 children. It was [facility]  but a little stressful when I was a preteen  because mother had alcohol issuese, we moved around a lot. I ended up having to stay with sister at age 57 as mother moved back to [facility].  Patient's description of current relationship with people who raised him/her: " okay, I don't see her as often as I should, I feel guilty about that" How were you disciplined when you got in trouble as a child/adolescent?: yelling, some spankings Does patient have siblings?: Yes Number of Siblings: 2 Description of patient's current relationship with siblings: never had a close relationship with my oldest sister, really close relationship with  my other sister but my illness has paralyzed me from dealing with her Did patient suffer any verbal/emotional/physical/sexual abuse as a child?: No Did patient suffer from severe childhood neglect?: No Patient description of severe childhood neglect: pt reports mother did not pay bills to ensure food, shelter, and clothing due to alcoholism, she sent pt to live with oldest sister. Has patient ever been sexually abused/assaulted/raped as an adolescent or adult?: No Was the patient ever a victim of a crime or a disaster?: No Witnessed domestic violence?: No Has patient been affected by domestic violence as an adult?: No  Child/Adolescent Assessment: N/A     CCA Substance Use Alcohol/Drug Use: Alcohol / Drug Use Pain Medications: See patient record Prescriptions: See patient record Over the Counter: See patient record History of alcohol / drug use?: No history of alcohol / drug abuse  ASAM's:  Six Dimensions of Multidimensional Assessment  Dimension 1:  Acute Intoxication and/or Withdrawal Potential:   Dimension 1:  Description of individual's past and current experiences of substance use and withdrawal: none  Dimension 2:  Biomedical Conditions and Complications:   Dimension 2:  Description of patient's biomedical conditions and  complications: none  Dimension 3:  Emotional, Behavioral, or Cognitive Conditions and Complications:  Dimension 3:  Description of emotional, behavioral, or cognitive conditions and complications: none  Dimension 4:  Readiness to Change:  Dimension 4:  Description of Readiness to Change criteria: none  Dimension 5:  Relapse, Continued use, or Continued Problem Potential:  Dimension 5:  Relapse, continued use, or continued problem potential critiera description: none  Dimension 6:  Recovery/Living Environment:  Dimension 6:  Recovery/Iiving environment criteria description: none  ASAM Severity Score: ASAM's Severity Rating Score: 0  ASAM Recommended Level of  Treatment:     Substance use Disorder (SUD) None  Recommendations for Services/Supports/Treatments: Recommendations for Services/Supports/Treatments Recommendations For Services/Supports/Treatments: Individual Therapy, Medication Management/patient attends the assessment appointment today.  Nutritional assessment, pain assessment, PHQ 2 and 9 with C-S SRS administered.  Patient agrees to return for an appointment in 1 week.  Individual therapy is recommended 1 time every 1 to 4 weeks to alleviate symptoms of depression and resume normal interest/involvement in activities.  Patient continues to see psychiatrist Dr. Modesta Messing for medication management.  DSM5 Diagnoses: Patient Active Problem List   Diagnosis Date Noted   Constipation 11/03/2020   Chronic right-sided low back pain with right-sided sciatica 12/03/2019   Menometrorrhagia 01/08/2019   Family hx of colon cancer 06/23/2018   IDA (iron deficiency anemia) 03/09/2018   Obesity (BMI 30.0-34.9) 09/02/2017   Major depressive disorder, recurrent episode, moderate ([place]) 01/17/2017   Sleep-disordered breathing 02/02/2016   Insomnia 08/27/2011   Type 2 diabetes mellitus with vascular disease ([place]) 08/27/2011   Dyslipidemia (high LDL; low HDL) 04/23/2011   Palpitations 08/04/2010   Essential hypertension 12/24/2007    Patient Centered Plan: Patient is on the following Treatment Plan(s):  Depression   Referrals to Alternative Service(s): Referred to Alternative Service(s):   Place:   Date:   Time:    Referred to Alternative Service(s):   Place:   Date:   Time:    Referred to Alternative Service(s):   Place:   Date:   Time:    Referred to Alternative Service(s):   Place:   Date:   Time:     [name], LCSW

## 2021-04-11 NOTE — Plan of Care (Signed)
Pt gives verbal consent to plan.

## 2021-04-12 ENCOUNTER — Ambulatory Visit: Payer: No Typology Code available for payment source | Admitting: Family Medicine

## 2021-04-20 ENCOUNTER — Ambulatory Visit (INDEPENDENT_AMBULATORY_CARE_PROVIDER_SITE_OTHER): Payer: No Typology Code available for payment source | Admitting: Psychiatry

## 2021-04-20 ENCOUNTER — Other Ambulatory Visit: Payer: Self-pay

## 2021-04-20 DIAGNOSIS — F331 Major depressive disorder, recurrent, moderate: Secondary | ICD-10-CM

## 2021-04-20 NOTE — Plan of Care (Signed)
Pt verbalizes consent.

## 2021-04-20 NOTE — Progress Notes (Signed)
Virtual Visit via Video Note  I connected with Mary Roman on 04/20/21 at 3:08 PM EDT  by a video enabled telemedicine application and verified that I am speaking with the correct person using two identifiers.  Location: Patient: Car Provider: [place] office    I discussed the limitations of evaluation and management by telemedicine and the availability of in person appointments. The patient expressed understanding and agreed to proceed.   I provided 57 minutes of non-face-to-face time during this encounter.   [name], LCSW     THERAPIST PROGRESS NOTE  Session Time: Thursday 04/20/2021 3:08 PM - 4:05 PM   Participation Level: Active  Behavioral Response: CasualAlert/Depressed/Anxious/  Type of Therapy: Individual Therapy  Treatment Goals addressed:  Reduce frequency, intensity, and duration of depression symptoms as evidenced by patient having interaction or doing recreational activities with her children 2 times per week.   Interventions: CBT and Supportive  Summary: Mary Roman is a 50 y.o. female who initially was referred for services by psychiatrist Dr. Modesta Messing.  Patient reports experiencing symptoms of depression intermittently for the past 7 to 8 years.  She reports participating in therapy twice with [facility] counselor in the past.  She reports no psychiatric hospitalizations.  She began taking psychotropic medications many years ago as prescribed by PCP.  She is a returning patient to this clinician and last was seen in 2020.  She is resuming services per recommendation from Dr. Modesta Messing.  Patient states they have not been able to find the right medication cocktail.  She reports still feeling depressed, anxious, and hopeless all the time.  She states barely being able to make it to work.  She reports stress related to family dynamics including issues with her son and daughter.  Her son is suffering from depression and her daughter internalizes  her feelings.  She also reports stress regarding caretaker issues regarding her mother.  Patient last was seen via virtual visit about a week ago for the assessment appointment.  She reports no change in symptoms and continues to experience depressed mood, loss of appetite, hopelessness, helplessness, excessive worry, crying spells, poor motivation, difficulty falling asleep as well as excessive sleeping, diminished interest pleasure in activities, and isolated behaviors.  She continues to report stress regarding issues with her son who tends to isolate as well.  She reports less worry about her daughter.  She reports continued stress and anxiety regarding family dynamics involving her mother and her brother.  Patient continues to go to work but reports missing 1 day last session due to depression.      Suicidal/Homicidal: Nowithout intent/plan,   Therapist Response: Reviewed symptoms, discussed stressors, facilitated expression of thoughts and feelings, validated feelings, provided psychoeducation on depression and the role of behavioral activation and self-care in overcoming depression, assisted patient identify ways to improve self-care regarding nutrition, develop plan with patient to eat 3 meals a day, also developed plan with patient to eat her evening meal with her daughter and engage in conversation with daughter, assisted patient identify and address thoughts and processes that may inhibit implementation of plan, assisted patient identify reasons for implementing plan   Plan: Return again in 1- 2 weeks.   Diagnosis: Axis I: MDD    Axis II: Deferred    [name], LCSW 04/20/2021

## 2021-04-24 ENCOUNTER — Other Ambulatory Visit (HOSPITAL_COMMUNITY): Payer: Self-pay

## 2021-04-24 ENCOUNTER — Other Ambulatory Visit: Payer: Self-pay | Admitting: Family Medicine

## 2021-04-24 MED ORDER — JANUMET XR 100-1000 MG PO TB24
1.0000 | ORAL_TABLET | Freq: Every day | ORAL | 0 refills | Status: DC
  Filled 2021-04-24: qty 90, 90d supply, fill #0
  Filled 2021-04-27: qty 30, 30d supply, fill #0
  Filled 2021-06-11: qty 30, 30d supply, fill #1
  Filled 2021-08-10: qty 30, 30d supply, fill #2

## 2021-04-26 ENCOUNTER — Telehealth: Payer: Self-pay | Admitting: Family Medicine

## 2021-04-26 NOTE — Telephone Encounter (Signed)
Sent on 9/26

## 2021-04-26 NOTE — Telephone Encounter (Signed)
Please send in Metformin to [facility]

## 2021-04-27 ENCOUNTER — Ambulatory Visit (HOSPITAL_COMMUNITY): Payer: No Typology Code available for payment source | Admitting: Psychiatry

## 2021-04-27 ENCOUNTER — Other Ambulatory Visit (HOSPITAL_COMMUNITY): Payer: Self-pay

## 2021-05-03 NOTE — Progress Notes (Signed)
Virtual Visit via Video Note  I connected with Mary Roman on 05/04/21 at  4:30 PM EDT by a video enabled telemedicine application and verified that I am speaking with the correct person using two identifiers.  Location: Patient: home Provider: office Persons participated in the visit- patient, provider    I discussed the limitations of evaluation and management by telemedicine and the availability of in person appointments. The patient expressed understanding and agreed to proceed.   I discussed the assessment and treatment plan with the patient. The patient was provided an opportunity to ask questions and all were answered. The patient agreed with the plan and demonstrated an understanding of the instructions.   The patient was advised to call back or seek an in-person evaluation if the symptoms worsen or if the condition fails to improve as anticipated.  I provided 12 minutes of non-face-to-face time during this encounter.   Mary Clay, MD    [facility] MD/PA/NP OP Progress Note  05/04/2021 5:05 PM Mary Roman  MRN:  [id number]  Chief Complaint:  Chief Complaint   Follow-up; Depression    HPI:  This is a follow-up appointment for depression.  She states that she is not doing too good.  She does not think the medication is helping.  She feels severely depressed, nervous all the time, and is not interested in doing anything.  She goes to work barely, although she has been able to attend every day.  She feels frustrated that it has been so hard to do anything.  Although she has not been able to do any exercise, she agrees to start it.  She continues to have insomnia.  She has difficulty in concentration.  Although she reports passive SI, she denies intent or plans. She does not think she can do [place] or ECT due to her schedule.  She is willing to try Abilify.    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  one son, one daughter  Visit Diagnosis:    ICD-10-CM   1. Major depressive disorder, recurrent episode, moderate ([place])  F33.1       Past Psychiatric History: Please see initial evaluation for full details. I have reviewed the history. No updates at this time.     Past Medical History:  Past Medical History:  Diagnosis Date   Absolute anemia 06/23/2018   Added automatically from request for surgery [id number]   Anemia    Anxiety    Backache 12/24/2007   Chronic back pain    Depression    Diabetes mellitus, type II ([place])    Headache(784.0)    Hypertension    Metabolic syndrome X 05/26/2535   Obesity    Palpitation    Sickle cell trait ([place])    Thyromegaly    Unspecified vitamin D deficiency 02/19/2013    Past Surgical History:  Procedure Laterality Date   COLONOSCOPY WITH PROPOFOL N/A 01/03/2021   Procedure: COLONOSCOPY WITH PROPOFOL;  Surgeon: Harvel Quale, MD;  Location: [facility] END[facility] SUITE;  Service: Gastroenterology;  Laterality: N/A;  AM   DILATATION AND CURETTAGE/HYSTEROSCOPY WITH MINERVA N/A 03/25/2019   Procedure: DILATATION AND CURETTAGE/HYSTEROSCOPY WITH  ATTEMPTED MINERVA ENDOMETRIAL ABLATION;  Surgeon: Florian Buff, MD;  Location: [facility] ORS;  Service: Gynecology;  Laterality: N/A;   None      Family Psychiatric History: Please see initial evaluation for full details. I have reviewed the history. No  updates at this time.     Family History:  Family History  Problem Relation Age of Onset   Diabetes Mother    Hypertension Mother    Stroke Mother    Colon cancer Mother    Alcohol abuse Mother    Cancer - Colon Mother 15   Pneumonia Father    Depression Sister    Hypertension Sister    Anxiety disorder Sister    Hypertension Sister    Leukemia Sister    Heart attack Maternal Uncle    Hypertension Maternal Grandfather    Diabetes Maternal Grandmother    Hypertension Maternal Grandmother     Depression Child     Social History:  Social History   Socioeconomic History   Marital status: Single    Spouse name: Not on file   Number of children: 2   Years of education: Not on file   Highest education level: Not on file  Occupational History   Occupation: employed in medical office   Tobacco Use   Smoking status: Never   Smokeless tobacco: Never  Vaping Use   Vaping Use: Never used  Substance and Sexual Activity   Alcohol use: No   Drug use: No   Sexual activity: Yes    Partners: Male    Birth control/protection: Other-see comments    Comment: bf had vasectomy  Other Topics Concern   Not on file  Social History Narrative   Not on file   Social Determinants of Health   Financial Resource Strain: Not on file  Food Insecurity: Not on file  Transportation Needs: Not on file  Physical Activity: Not on file  Stress: Not on file  Social Connections: Not on file    Allergies: No Known Allergies  Metabolic Disorder Labs: Lab Results  Component Value Date   HGBA1C 7.1 (H) 12/14/2020   MPG 200.12 04/18/2020   MPG 206 01/13/2020   No results found for: PROLACTIN Lab Results  Component Value Date   CHOL 122 09/01/2020   TRIG 88 09/01/2020   HDL 25 (L) 09/01/2020   CHOLHDL 4.9 (H) 09/01/2020   VLDL 12 04/18/2020   LDLCALC 80 09/01/2020   LDLCALC 84 04/18/2020   Lab Results  Component Value Date   TSH 1.400 09/01/2020   TSH 1.916 05/13/2019    Therapeutic Level Labs: No results found for: LITHIUM No results found for: VALPROATE No components found for:  CBMZ  Current Medications: Current Outpatient Medications  Medication Sig Dispense Refill   acetaminophen (TYLENOL) 500 MG tablet Take 1,000 mg by mouth every 6 (six) hours as needed (pain).     aspirin EC 81 MG tablet Take 1 tablet (81 mg total) by mouth daily. (Patient taking differently: Take 81 mg by mouth in the morning.) 90 tablet 3   blood glucose meter kit and supplies Dispense based on  patient and insurance preference. Once daily testing dx e11.9 1 each 0   buPROPion (WELLBUTRIN XL) 150 MG 24 hr tablet Take 3 tablets (450 mg total) by mouth daily. 270 tablet 0   glipiZIDE (GLUCOTROL XL) 10 MG 24 hr tablet Take 2 tablets (20 mg total) by mouth daily with breakfast. 180 tablet 1   megestrol (MEGACE) 40 MG tablet TAKE 3 TABLETS BY MOUTH ONCE DAILY FOR 5 DAYS, THEN TAKE 2 TABLETS ONCE DAILY FOR 5 DAYS, THEN TAKE 1 TABLET ONCE DAILY. (Patient taking differently: Take 40 mg by mouth in the morning.) 45 tablet 5   metoprolol tartrate (  LOPRESSOR) 25 MG tablet Take 1 tablet (25 mg total) by mouth daily. 90 tablet 0   rosuvastatin (CRESTOR) 5 MG tablet Take 1 tablet (5 mg total) by mouth daily. (Patient not taking: No sig reported) 90 tablet 3   SitaGLIPtin-MetFORMIN HCl (JANUMET XR) [phone] MG TB24 Take 1 tablet by mouth daily. 90 tablet 0   SitaGLIPtin-MetFORMIN HCl [phone] MG TB24 TAKE ONE TABLET BY MOUTH ONCE EVERY DAY 30 tablet 5   temazepam (RESTORIL) 30 MG capsule Take 1 capsule (30 mg total) by mouth at bedtime as needed for sleep. 30 capsule 2   triamterene-hydrochlorothiazide (MAXZIDE-25) 37.5-25 MG tablet TAKE 1 TABLET BY MOUTH DAILY. (Patient taking differently: Take 1 tablet by mouth in the morning.) 90 tablet 3   venlafaxine XR (EFFEXOR-XR) 150 MG 24 hr capsule Take 1 capsule (150 mg total) by mouth daily along with 75 mg capsule to equal 225 mg total daily. 90 capsule 1   venlafaxine XR (EFFEXOR-XR) 75 MG 24 hr capsule Take 1 capsule (75 mg total) by mouth daily with 150 mg capsule to equal 225 mg total daily. 90 capsule 1   Vitamin D, Ergocalciferol, (DRISDOL) 1.25 MG (50000 UNIT) CAPS capsule TAKE 1 CAPSULE (50,000 UNITS TOTAL) BY MOUTH ONCE A WEEK. (Patient not taking: Reported on 04/11/2021) 12 capsule 2   No current facility-administered medications for this visit.     Musculoskeletal: Strength & Muscle Tone:  N/A Gait & Station:  N/A Patient leans:  N/A  Psychiatric Specialty Exam: Review of Systems  Psychiatric/Behavioral:  Positive for decreased concentration, dysphoric mood, sleep disturbance and suicidal ideas. Negative for agitation, behavioral problems, confusion, hallucinations and self-injury. The patient is nervous/anxious. The patient is not hyperactive.   All other systems reviewed and are negative.  There were no vitals taken for this visit.There is no height or weight on file to calculate BMI.  General Appearance: Fairly Groomed  Eye Contact:  Good  Speech:  Clear and Coherent  Volume:  Normal  Mood:  Depressed  Affect:  Appropriate, Congruent, Restricted, and down  Thought Process:  Coherent  Orientation:  Full (Time, Place, and Person)  Thought Content: Logical   Suicidal Thoughts:  Yes.  without intent/plan  Homicidal Thoughts:  No  Memory:  Immediate;   Good  Judgement:  Good  Insight:  Good  Psychomotor Activity:  Normal  Concentration:  Concentration: Good and Attention Span: Good  Recall:  Good  Fund of Knowledge: Good  Language: Good  Akathisia:  No  Handed:  Right  AIMS (if indicated): not done  Assets:  Communication Skills Desire for Improvement  ADL's:  Intact  Cognition: WNL  Sleep:  Poor   Screenings: GAD-7    Flowsheet Row Counselor from 05/15/2018 in [facility] Office Visit from 01/08/2017 in [facility]  Total GAD-7 Score 17 14      PHQ2-9    Flowsheet Row Counselor from 04/11/2021 in [facility] Office Visit from 12/20/2020 in [facility] Video Visit from 11/28/2020 in [facility] Video Visit from 11/08/2020 in [facility] from 10/21/2020 in [facility]  PHQ-2 Total Score _0 0  PHQ-9 Total Score 21 -- 6 9 --      Flowsheet Row Video Visit from 05/04/2021 in [facility] Counselor from 04/11/2021 in [facility] Ad[facility] (Discharged) from 01/03/2021 in [facility]  CATEGORY Low Risk No Risk Error: Question 6 not populated        Assessment and Plan:  Mary Roman is a 50 y.o. year old female with a history of depression, diabetes, who presents for follow up appointment for below.   1. Major depressive disorder, recurrent episode, moderate (HCC) There has been worsening in depressive symptoms, and has limited benefit from up titration of bupropion.  Will add Abilify as adjunctive treatment for depression.  Discussed potential metabolic side effect and EPS.  Noted that although the option of TMS/ECT was discussed, she does not think she will be able to do these due to her schedule.  Coached behavioral activation.  She will continue to see Ms. Bynum for therapy.   2. Insomnia, unspecified type Unchanged. She continues to have prominent fatigue, middle insomnia and snoring.  She was referred for sleep evaluation.  She was advised again to contact the clinic to make an appointment.  Plan 1. Continue venlafaxine 225 mg daily  2. Continue bupropion 450 mg daily 3. Start Abilify 2 mg daily  4. Next appointment: 11/17 at 4:30 for 30 mins, video Emergency resources which includes 911, ED, suicide crisis line [phone]) are discussed.     -on  temazepam 30 mg at night as needed for sleep - on gabapentin for pain and hydroxyzine   Past trials of medication: fluoxetine, duloxetine, Abilify (may consider restarting this),  buspirone, Trazodone ("weird" feeling) temazepam, Xanax   I have reviewed suicide assessment in detail. No change in the following assessment.    The patient demonstrates the following risk factors for suicide: Chronic risk factors for suicide include: psychiatric disorder of depression, anxiety and chronic pain. Acute risk factors for suicide  include: N/A. Protective factors for this patient include: positive social support, responsibility to others (children, family), coping skills and hope for the future. Considering these factors, the overall suicide risk at this point appears to be low. Patient is appropriate for outpatient follow up.      Mary Clay, MD 05/04/2021, 5:05 PM

## 2021-05-04 ENCOUNTER — Other Ambulatory Visit: Payer: Self-pay

## 2021-05-04 ENCOUNTER — Encounter: Payer: Self-pay | Admitting: Psychiatry

## 2021-05-04 ENCOUNTER — Telehealth (INDEPENDENT_AMBULATORY_CARE_PROVIDER_SITE_OTHER): Payer: No Typology Code available for payment source | Admitting: Psychiatry

## 2021-05-04 DIAGNOSIS — F331 Major depressive disorder, recurrent, moderate: Secondary | ICD-10-CM | POA: Diagnosis not present

## 2021-05-04 NOTE — Patient Instructions (Signed)
1. Continue venlafaxine 225 mg daily  2. Continue bupropion 450 mg daily 3. Start Abilify 2 mg daily  4. Next appointment: 11/17 at 4:30

## 2021-05-09 ENCOUNTER — Other Ambulatory Visit (HOSPITAL_COMMUNITY): Payer: Self-pay

## 2021-05-09 ENCOUNTER — Ambulatory Visit (INDEPENDENT_AMBULATORY_CARE_PROVIDER_SITE_OTHER): Payer: No Typology Code available for payment source | Admitting: Family Medicine

## 2021-05-09 ENCOUNTER — Other Ambulatory Visit: Payer: Self-pay

## 2021-05-09 ENCOUNTER — Encounter: Payer: Self-pay | Admitting: Family Medicine

## 2021-05-09 VITALS — BP 132/88 | HR 82 | Resp 17 | Ht 65.0 in | Wt 189.0 lb

## 2021-05-09 DIAGNOSIS — E785 Hyperlipidemia, unspecified: Secondary | ICD-10-CM | POA: Diagnosis not present

## 2021-05-09 DIAGNOSIS — E669 Obesity, unspecified: Secondary | ICD-10-CM | POA: Diagnosis not present

## 2021-05-09 DIAGNOSIS — I10 Essential (primary) hypertension: Secondary | ICD-10-CM

## 2021-05-09 DIAGNOSIS — R002 Palpitations: Secondary | ICD-10-CM

## 2021-05-09 DIAGNOSIS — E1159 Type 2 diabetes mellitus with other circulatory complications: Secondary | ICD-10-CM | POA: Diagnosis not present

## 2021-05-09 DIAGNOSIS — F331 Major depressive disorder, recurrent, moderate: Secondary | ICD-10-CM

## 2021-05-09 MED ORDER — ROSUVASTATIN CALCIUM 5 MG PO TABS
5.0000 mg | ORAL_TABLET | Freq: Every day | ORAL | 3 refills | Status: DC
  Filled 2021-05-09: qty 90, 90d supply, fill #0
  Filled 2021-08-10: qty 90, 90d supply, fill #1
  Filled 2022-01-10: qty 90, 90d supply, fill #2

## 2021-05-09 MED ORDER — METOPROLOL TARTRATE 25 MG PO TABS
25.0000 mg | ORAL_TABLET | Freq: Every day | ORAL | 3 refills | Status: DC
  Filled 2021-05-09: qty 90, 90d supply, fill #0

## 2021-05-09 MED ORDER — BENAZEPRIL HCL 5 MG PO TABS
5.0000 mg | ORAL_TABLET | Freq: Every day | ORAL | 3 refills | Status: DC
  Filled 2021-05-09: qty 90, 90d supply, fill #0
  Filled 2021-09-26: qty 90, 90d supply, fill #1
  Filled 2022-02-05: qty 90, 90d supply, fill #2

## 2021-05-09 NOTE — Assessment & Plan Note (Signed)
Controlled on medication

## 2021-05-09 NOTE — Assessment & Plan Note (Signed)
Hyperlipidemia:Low fat diet discussed and encouraged.   Lipid Panel  Lab Results  Component Value Date   CHOL 122 09/01/2020   HDL 25 (L) 09/01/2020   LDLCALC 80 09/01/2020   TRIG 88 09/01/2020   CHOLHDL 4.9 (H) 09/01/2020     Non compliant with medication and needs to start exercise, severe depression prevents following through on exercise plan

## 2021-05-09 NOTE — Assessment & Plan Note (Signed)
DASH diet and commitment to daily physical activity for a minimum of 30 minutes discussed and encouraged, as a part of hypertension management. The importance of attaining a healthy weight is also discussed.  BP/Weight 05/09/2021 01/03/2021 12/20/2020 11/15/2020 11/03/2020 1/85/5015 03/04/8256  Systolic BP 493 552 174 715 953 - -  Diastolic BP 88 78 80 84 82 - -  Wt. (Lbs) 189.04 185 185 189.4 197 200 208  BMI 31.46 30.79 30.79 31.52 32.78 33.28 34.61  Some encounter information is confidential and restricted. Go to Review Flowsheets activity to see all data.  Add benazepril 5 mg daily

## 2021-05-09 NOTE — Assessment & Plan Note (Signed)
Mary Roman is reminded of the importance of commitment to daily physical activity for 30 minutes or more, as able and the need to limit carbohydrate intake to 30 to 60 grams per meal to help with blood sugar control.   The need to take medication as prescribed, test blood sugar as directed, and to call between visits if there is a concern that blood sugar is uncontrolled is also discussed.   Mary Roman is reminded of the importance of daily foot exam, annual eye examination, and good blood sugar, blood pressure and cholesterol control.  Diabetic Labs Latest Ref Rng & Units 01/03/2021 01/02/2021 12/14/2020 09/01/2020 04/18/2020  HbA1c 4.8 - 5.6 % - - 7.1(H) 10.4(H) 8.6(H)  Microalbumin Not Estab. ug/mL - - - - -  Micro/Creat Ratio 0 - 29 mg/g creat - - - - -  Chol 100 - 199 mg/dL - - - 122 126  HDL >39 mg/dL - - - 25(L) 30(L)  Calc LDL 0 - 99 mg/dL - - - 80 84  Triglycerides 0 - 149 mg/dL - - - 88 59  Creatinine 0.44 - 1.00 mg/dL 1.10(H) 1.27(H) 1.01(H) 1.31(H) 0.97   BP/Weight 05/09/2021 01/03/2021 12/20/2020 11/15/2020 11/03/2020 0/38/8828 0/0/3491  Systolic BP 791 505 697 948 016 - -  Diastolic BP 88 78 80 84 82 - -  Wt. (Lbs) 189.04 185 185 189.4 197 200 208  BMI 31.46 30.79 30.79 31.52 32.78 33.28 34.61  Some encounter information is confidential and restricted. Go to Review Flowsheets activity to see all data.   Foot/eye exam completion dates Latest Ref Rng & Units 09/26/2020 04/19/2020  Eye Exam No Retinopathy No Retinopathy -  Foot Form Completion - - Done      Lab today to assess control

## 2021-05-09 NOTE — Assessment & Plan Note (Addendum)
Severe , uncontrolled, not suicidal or homicidal, managed by Psych, involved in therapy also which is good, patient spent 10 minutes discussing her depression, and coping skills which she has tried to develop to remain functional

## 2021-05-09 NOTE — Assessment & Plan Note (Signed)
  Patient re-educated about  the importance of commitment to a  minimum of 150 minutes of exercise per week as able.  The importance of healthy food choices with portion control discussed, as well as eating regularly and within a 12 hour window most days. The need to choose "clean , green" food 50 to 75% of the time is discussed, as well as to make water the primary drink and set a goal of 64 ounces water daily.    Weight /BMI 05/09/2021 01/03/2021 12/20/2020  WEIGHT 189 lb 0.6 oz 185 lb 185 lb  HEIGHT 5\' 5"  5\' 5"  5\' 5"   BMI 31.46 kg/m2 30.79 kg/m2 30.79 kg/m2  Some encounter information is confidential and restricted. Go to Review Flowsheets activity to see all data.

## 2021-05-09 NOTE — Progress Notes (Signed)
Mary Roman     MRN: [id number]      DOB: [birth year]   HPI Mary Roman is here for follow up and re-evaluation of chronic medical conditions, medication management and review of any available recent lab and radiology data.  Preventive health is updated, specifically  Cancer screening and Immunization.   Questions or concerns regarding consultations or procedures which the PT has had in the interim are  addressed. The PT denies any adverse reactions to current medications since the last visit.  C/o uncontrolled depression, unable to change behavior to improve blood sugar as a result, not suicidal or homicidal, working very closely with mental health   ROS Denies recent fever or chills. Denies sinus pressure, nasal congestion, ear pain or sore throat. Denies chest congestion, productive cough or wheezing. Denies chest pains, palpitations and leg swelling Denies abdominal pain, nausea, vomiting,diarrhea or constipation.   Denies dysuria, frequency, hesitancy or incontinence. Denies joint pain, swelling and limitation in mobility. Denies headaches, seizures, numbness, or tingling.  Denies skin break down or rash.   PE  BP 132/88   Pulse 82   Resp 17   Ht 5\' 5"  (1.651 m)   Wt 189 lb 0.6 oz (85.7 kg)   SpO2 97%   BMI 31.46 kg/m   Patient alert and oriented and in no cardiopulmonary distress.  HEENT: No facial asymmetry, EOMI,     Neck supple .  Chest: Clear to auscultation bilaterally.  CVS: S1, S2 no murmurs, no S3.Regular rate.  ABD: Soft non tender.   Ext: No edema  MS: Adequate ROM spine, shoulders, hips and knees.  Skin: Intact, no ulcerations or rash noted.  Psych: Good eye contact, normal affect. Memory intact not anxious or depressed appearing.  CNS: CN 2-12 intact, power,  normal throughout.no focal deficits noted.   Assessment & Plan  Essential hypertension DASH diet and commitment to daily physical activity for a minimum of 30 minutes  discussed and encouraged, as a part of hypertension management. The importance of attaining a healthy weight is also discussed.  BP/Weight 05/09/2021 01/03/2021 12/20/2020 11/15/2020 11/03/2020 8/67/6195 0/03/3266  Systolic BP 124 580 998 338 250 - -  Diastolic BP 88 78 80 84 82 - -  Wt. (Lbs) 189.04 185 185 189.4 197 200 208  BMI 31.46 30.79 30.79 31.52 32.78 33.28 34.61  Some encounter information is confidential and restricted. Go to Review Flowsheets activity to see all data.  Add benazepril 5 mg daily     Dyslipidemia (high LDL; low HDL) Hyperlipidemia:Low fat diet discussed and encouraged.   Lipid Panel  Lab Results  Component Value Date   CHOL 122 09/01/2020   HDL 25 (L) 09/01/2020   LDLCALC 80 09/01/2020   TRIG 88 09/01/2020   CHOLHDL 4.9 (H) 09/01/2020     Non compliant with medication and needs to start exercise, severe depression prevents following through on exercise plan  Obesity (BMI 30.0-34.9)  Patient re-educated about  the importance of commitment to a  minimum of 150 minutes of exercise per week as able.  The importance of healthy food choices with portion control discussed, as well as eating regularly and within a 12 hour window most days. The need to choose "clean , green" food 50 to 75% of the time is discussed, as well as to make water the primary drink and set a goal of 64 ounces water daily.    Weight /BMI 05/09/2021 01/03/2021 12/20/2020  WEIGHT 189 lb 0.6 oz 185 lb  185 lb  HEIGHT 5\' 5"  5\' 5"  5\' 5"   BMI 31.46 kg/m2 30.79 kg/m2 30.79 kg/m2  Some encounter information is confidential and restricted. Go to Review Flowsheets activity to see all data.      Palpitations Controlled on medication  Major depressive disorder, recurrent episode, moderate (HCC) Severe , uncontrolled, not suicidal or homicidal, managed by Psych, involved in therapy also which is good, patient spent 10 minutes discussing her depression, and coping skills which she has tried to  develop to remain functional  Type 2 diabetes mellitus with vascular disease ([place]) Mary Roman is reminded of the importance of commitment to daily physical activity for 30 minutes or more, as able and the need to limit carbohydrate intake to 30 to 60 grams per meal to help with blood sugar control.   The need to take medication as prescribed, test blood sugar as directed, and to call between visits if there is a concern that blood sugar is uncontrolled is also discussed.   Mary Roman is reminded of the importance of daily foot exam, annual eye examination, and good blood sugar, blood pressure and cholesterol control.  Diabetic Labs Latest Ref Rng & Units 01/03/2021 01/02/2021 12/14/2020 09/01/2020 04/18/2020  HbA1c 4.8 - 5.6 % - - 7.1(H) 10.4(H) 8.6(H)  Microalbumin Not Estab. ug/mL - - - - -  Micro/Creat Ratio 0 - 29 mg/g creat - - - - -  Chol 100 - 199 mg/dL - - - 122 126  HDL >39 mg/dL - - - 25(L) 30(L)  Calc LDL 0 - 99 mg/dL - - - 80 84  Triglycerides 0 - 149 mg/dL - - - 88 59  Creatinine 0.44 - 1.00 mg/dL 1.10(H) 1.27(H) 1.01(H) 1.31(H) 0.97   BP/Weight 05/09/2021 01/03/2021 12/20/2020 11/15/2020 11/03/2020 1/61/0960 11/01/4096  Systolic BP 119 147 829 562 130 - -  Diastolic BP 88 78 80 84 82 - -  Wt. (Lbs) 189.04 185 185 189.4 197 200 208  BMI 31.46 30.79 30.79 31.52 32.78 33.28 34.61  Some encounter information is confidential and restricted. Go to Review Flowsheets activity to see all data.   Foot/eye exam completion dates Latest Ref Rng & Units 09/26/2020 04/19/2020  Eye Exam No Retinopathy No Retinopathy -  Foot Form Completion - - Done      Lab today to assess control

## 2021-05-09 NOTE — Patient Instructions (Addendum)
F/u in 2 to 2.5 months, call if you need me before  Labs ordered in May to be drawn today  New for blood pressure and kidney protection is benazepril 5 mg daily  Please resume crestor as before  I hope thay you start to feel better, continue to make very effort to get the treatment that you need  Thanks for choosing [facility], we consider it a privelige to serve you.

## 2021-05-10 ENCOUNTER — Other Ambulatory Visit (HOSPITAL_COMMUNITY): Payer: Self-pay

## 2021-05-10 LAB — CBC WITH DIFFERENTIAL
Basophils Absolute: 0.1 10*3/uL (ref 0.0–0.2)
Basos: 1 %
EOS (ABSOLUTE): 0.2 10*3/uL (ref 0.0–0.4)
Eos: 3 %
Hematocrit: 35.5 % (ref 34.0–46.6)
Hemoglobin: 12.1 g/dL (ref 11.1–15.9)
Immature Grans (Abs): 0 10*3/uL (ref 0.0–0.1)
Immature Granulocytes: 0 %
Lymphocytes Absolute: 3.5 10*3/uL — ABNORMAL HIGH (ref 0.7–3.1)
Lymphs: 38 %
MCH: 31.1 pg (ref 26.6–33.0)
MCHC: 34.1 g/dL (ref 31.5–35.7)
MCV: 91 fL (ref 79–97)
Monocytes Absolute: 0.5 10*3/uL (ref 0.1–0.9)
Monocytes: 5 %
Neutrophils Absolute: 5 10*3/uL (ref 1.4–7.0)
Neutrophils: 53 %
RBC: 3.89 x10E6/uL (ref 3.77–5.28)
RDW: 12.9 % (ref 11.7–15.4)
WBC: 9.3 10*3/uL (ref 3.4–10.8)

## 2021-05-10 LAB — HEMOGLOBIN A1C
Est. average glucose Bld gHb Est-mCnc: 163 mg/dL
Hgb A1c MFr Bld: 7.3 % — ABNORMAL HIGH (ref 4.8–5.6)

## 2021-05-10 LAB — BASIC METABOLIC PANEL
BUN/Creatinine Ratio: 10 (ref 9–23)
BUN: 12 mg/dL (ref 6–24)
CO2: 23 mmol/L (ref 20–29)
Calcium: 9.9 mg/dL (ref 8.7–10.2)
Chloride: 101 mmol/L (ref 96–106)
Creatinine, Ser: 1.16 mg/dL — ABNORMAL HIGH (ref 0.57–1.00)
Glucose: 128 mg/dL — ABNORMAL HIGH (ref 70–99)
Potassium: 3.9 mmol/L (ref 3.5–5.2)
Sodium: 138 mmol/L (ref 134–144)
eGFR: 57 mL/min/{1.73_m2} — ABNORMAL LOW (ref >59)

## 2021-05-10 LAB — VITAMIN D 25 HYDROXY (VIT D DEFICIENCY, FRACTURES): Vit D, 25-Hydroxy: 22.5 ng/mL — ABNORMAL LOW (ref 30.0–100.0)

## 2021-05-11 ENCOUNTER — Other Ambulatory Visit (HOSPITAL_COMMUNITY): Payer: Self-pay

## 2021-05-11 ENCOUNTER — Other Ambulatory Visit: Payer: Self-pay

## 2021-05-11 ENCOUNTER — Other Ambulatory Visit: Payer: Self-pay | Admitting: Family Medicine

## 2021-05-11 ENCOUNTER — Ambulatory Visit (INDEPENDENT_AMBULATORY_CARE_PROVIDER_SITE_OTHER): Payer: No Typology Code available for payment source | Admitting: Psychiatry

## 2021-05-11 DIAGNOSIS — F331 Major depressive disorder, recurrent, moderate: Secondary | ICD-10-CM

## 2021-05-11 MED ORDER — ERGOCALCIFEROL 1.25 MG (50000 UT) PO CAPS
50000.0000 [IU] | ORAL_CAPSULE | ORAL | 1 refills | Status: DC
  Filled 2021-05-11: qty 12, 84d supply, fill #0
  Filled 2021-08-10: qty 12, 84d supply, fill #1

## 2021-05-11 NOTE — Progress Notes (Signed)
Virtual Visit via Telephone Note  I connected with Mary Roman on 05/11/21 at 3:16 PM EDT by telephone and verified that I am speaking with the correct person using two identifiers.  Location: Patient: Car Provider: [place] office    I discussed the limitations, risks, security and privacy concerns of performing an evaluation and management service by telephone and the availability of in person appointments. I also discussed with the patient that there may be a patient responsible charge related to this service. The patient expressed understanding and agreed to proceed.    I provided 44 minutes of non-face-to-face time during this encounter.   [name], LCSW     THERAPIST PROGRESS NOTE  Session Time: Thursday 05/11/2021 3:16 PM - 4:00 PM  Participation Level: Active  Behavioral Response: CasualAlert/Depressed/Anxious/  Type of Therapy: Individual Therapy  Treatment Goals addressed:  Reduce frequency, intensity, and duration of depression symptoms as evidenced by patient having interaction or doing recreational activities with her children 2 times per week.   Interventions: CBT and Supportive  Summary: Mary Roman is a 50 y.o. female who initially was referred for services by psychiatrist Dr. Modesta Messing.  Patient reports experiencing symptoms of depression intermittently for the past 7 to 8 years.  She reports participating in therapy twice with [facility] counselor in the past.  She reports no psychiatric hospitalizations.  She began taking psychotropic medications many years ago as prescribed by PCP.  She is a returning patient to this clinician and last was seen in 2020.  She is resuming services per recommendation from Dr. Modesta Messing.  Patient states they have not been able to find the right medication cocktail.  She reports still feeling depressed, anxious, and hopeless all the time.  She states barely being able to make it to work.  She reports stress  related to family dynamics including issues with her son and daughter.  Her son is suffering from depression and her daughter internalizes her feelings.  She also reports stress regarding caretaker issues regarding her mother.  Patient last was seen via virtual visit about a week ago for the assessment appointment.  She reports no change in symptoms and continues to experience depressed mood, loss of appetite, hopelessness, helplessness, excessive worry, crying spells, poor motivation, difficulty falling asleep as well as excessive sleeping, diminished interest pleasure in activities, and isolated behaviors.  She has started taking Abilify as instructed by psychiatrist Dr. Modesta Messing but says she has not experience any improvement in symptoms yet.  She reports she has eaten breakfast 1 day during the past week.  Other than this, she did reports she did not implement plans developed last session.  She continues to express guilt and sadness about her behavioral health and the effects she thinks it has on her children.  She reports not eating meals with her daughter or engaging with her daughter because she thinks she has to be in a good mood in order to engage with her daughter.    Suicidal/Homicidal: Nowithout intent/plan,   Therapist Response: Reviewed symptoms, discussed stressors, facilitated expression of thoughts and feelings, validated feelings, praised and reinforced patient's effort in eating breakfast, assisted patient identify her thoughts about engagement with her family, used ACT principles and cognitive defusion to help patient cope with unhelpful thoughts, assisted patient practice leaves on a stream exercise, assisted patient identify value congruent behavior to increase behavioral activation, obtained patient's commitment and developed plan with patient to take her daughter to the movies tomorrow  Plan: Return again in 1- 2 weeks.   Diagnosis: Axis I: MDD    Axis II: Deferred    [name], LCSW 05/11/2021

## 2021-05-12 ENCOUNTER — Other Ambulatory Visit (HOSPITAL_COMMUNITY): Payer: Self-pay

## 2021-05-15 ENCOUNTER — Other Ambulatory Visit: Payer: Self-pay | Admitting: Psychiatry

## 2021-05-15 ENCOUNTER — Telehealth: Payer: Self-pay

## 2021-05-15 ENCOUNTER — Other Ambulatory Visit (HOSPITAL_COMMUNITY): Payer: Self-pay

## 2021-05-15 MED ORDER — ARIPIPRAZOLE 2 MG PO TABS
2.0000 mg | ORAL_TABLET | Freq: Every day | ORAL | 0 refills | Status: DC
  Filled 2021-05-15: qty 30, 30d supply, fill #0

## 2021-05-15 NOTE — Telephone Encounter (Signed)
pt callled left message that she needs her abilify sent to the [facility].

## 2021-05-15 NOTE — Telephone Encounter (Signed)
Ordered

## 2021-05-15 NOTE — Telephone Encounter (Signed)
abili

## 2021-05-16 ENCOUNTER — Other Ambulatory Visit (HOSPITAL_COMMUNITY): Payer: Self-pay

## 2021-05-26 ENCOUNTER — Other Ambulatory Visit (HOSPITAL_COMMUNITY): Payer: Self-pay

## 2021-05-26 ENCOUNTER — Other Ambulatory Visit: Payer: Self-pay

## 2021-05-26 ENCOUNTER — Encounter: Payer: Self-pay | Admitting: Cardiology

## 2021-05-26 ENCOUNTER — Ambulatory Visit: Payer: No Typology Code available for payment source | Admitting: Cardiology

## 2021-05-26 VITALS — BP 147/86 | HR 86 | Ht 65.0 in | Wt 194.0 lb

## 2021-05-26 DIAGNOSIS — G4719 Other hypersomnia: Secondary | ICD-10-CM | POA: Diagnosis not present

## 2021-05-26 DIAGNOSIS — R002 Palpitations: Secondary | ICD-10-CM

## 2021-05-26 DIAGNOSIS — I10 Essential (primary) hypertension: Secondary | ICD-10-CM | POA: Diagnosis not present

## 2021-05-26 DIAGNOSIS — Z7189 Other specified counseling: Secondary | ICD-10-CM

## 2021-05-26 MED ORDER — METOPROLOL TARTRATE 25 MG PO TABS
25.0000 mg | ORAL_TABLET | Freq: Two times a day (BID) | ORAL | 3 refills | Status: DC
  Filled 2021-05-26 – 2021-08-10 (×2): qty 180, 90d supply, fill #0
  Filled 2022-01-10: qty 180, 90d supply, fill #1

## 2021-05-26 NOTE — Patient Instructions (Signed)
Medication Instructions:  Your physician has recommended you make the following change in your medication:  1) INCREASE Lopressor to 25 mg twice daily  *If you need a refill on your cardiac medications before your next appointment, please call your pharmacy*  Testing/Procedures: Your physician has recommended that you have a sleep study. This test records several body functions during sleep, including: brain activity, eye movement, oxygen and carbon dioxide blood levels, heart rate and rhythm, breathing rate and rhythm, the flow of air through your mouth and nose, snoring, body muscle movements, and chest and belly movement.  Your physician has recommended that you have a calcium score CT scan.   Follow-Up: At [facility], you and your health needs are our priority.  As part of our continuing mission to provide you with exceptional heart care, we have created designated Provider Care Teams.  These Care Teams include your primary Cardiologist (physician) and Advanced Practice Providers (APPs -  Physician Assistants and Nurse Practitioners) who all work together to provide you with the care you need, when you need it.   Your next appointment:   1 year(s)  The format for your next appointment:   In Person  Provider:   You may see Fransico Him, MD or one of the following Advanced Practice Providers on your designated Care Team:   [place], PA-C  Ermalinda Barrios, [place]

## 2021-05-26 NOTE — Addendum Note (Signed)
Addended by: Antonieta Iba on: 05/26/2021 03:42 PM   Modules accepted: Orders

## 2021-05-26 NOTE — Progress Notes (Signed)
CARDIOLOGY CNOTE  Patient ID: Mary Roman MRN: [id number] DOB/AGE: [birth year] 50 y.o.  Admit date: (Not on file) Primary Physician: Fayrene Helper, MD Referring Physician: Fayrene Helper, MD  Reason for Consultation: Palpitations  HPI: Mary Roman is a 50 y.o. female with a past medical history of type 2 diabetes, hypertension, depression, and chronic pain. She was evaluated for chest tightness and palpitations several years ago and was prescribed Lopressor 25 mg daily and she has been maintained on that for the past several years.  She had a recurrence of palpitations in September and October 2019.  This was exacerbated by problems with anxiety and depression which seem to be related to when she gets palpitations.    She is here today for followup and is doing well.  She denies any chest pain or pressure, SOB, DOE, PND, orthopnea, LE edema, dizziness, palpitations or syncope. She4 is compliant with her meds and is tolerating meds with no SE.   She is very concerned that her niece had an MI and died at 56 and she lost her brother at 10 to an MI.  Her brother was a smoker as well as her niece.    Family history: Mother has a history of CVA and atrial fibrillation.  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   No Known Allergies  Current Outpatient Medications  Medication Sig Dispense Refill   acetaminophen (TYLENOL) 500 MG tablet Take 1,000 mg by mouth every 6 (six) hours as needed (pain).     ARIPiprazole (ABILIFY) 2 MG tablet Take 1 tablet (2 mg total) by mouth daily. 30 tablet 0   aspirin EC 81 MG tablet Take 1 tablet (81 mg total) by mouth daily. (Patient taking differently: Take 81 mg by mouth in the morning.) 90 tablet 3   benazepril (LOTENSIN) 5 MG tablet Take 1 tablet (5 mg total) by mouth daily. 90 tablet 3   blood glucose meter kit and supplies Dispense based on patient and insurance preference. Once  daily testing dx e11.9 1 each 0   buPROPion (WELLBUTRIN XL) 150 MG 24 hr tablet Take 3 tablets (450 mg total) by mouth daily. 270 tablet 0   ergocalciferol (VITAMIN D2) 1.25 MG (50000 UT) capsule Take 1 capsule (50,000 Units total) by mouth once a week. 12 capsule 1   glipiZIDE (GLUCOTROL XL) 10 MG 24 hr tablet Take 2 tablets (20 mg total) by mouth daily with breakfast. 180 tablet 1   megestrol (MEGACE) 40 MG tablet TAKE 3 TABLETS BY MOUTH ONCE DAILY FOR 5 DAYS, THEN TAKE 2 TABLETS ONCE DAILY FOR 5 DAYS, THEN TAKE 1 TABLET ONCE DAILY. (Patient taking differently: Take 40 mg by mouth in the morning.) 45 tablet 5   metoprolol tartrate (LOPRESSOR) 25 MG tablet Take 1 tablet (25 mg total) by mouth daily. 90 tablet 3   rosuvastatin (CRESTOR) 5 MG tablet Take 1 tablet (5 mg total) by mouth daily. 90 tablet 3   SitaGLIPtin-MetFORMIN HCl (JANUMET XR) [phone] MG TB24 Take 1 tablet by mouth daily. 90 tablet 0   temazepam (RESTORIL) 30 MG capsule Take 1 capsule (30 mg total) by mouth at bedtime as needed for sleep. 30 capsule 2   triamterene-hydrochlorothiazide (MAXZIDE-25) 37.5-25 MG tablet TAKE 1 TABLET BY MOUTH DAILY. (Patient taking differently: Take 1 tablet by mouth in the morning.) 90 tablet 3   venlafaxine XR (EFFEXOR-XR) 150 MG 24 hr capsule Take  1 capsule (150 mg total) by mouth daily along with 75 mg capsule to equal 225 mg total daily. 90 capsule 1   venlafaxine XR (EFFEXOR-XR) 75 MG 24 hr capsule Take 1 capsule (75 mg total) by mouth daily with 150 mg capsule to equal 225 mg total daily. 90 capsule 1   Vitamin D, Ergocalciferol, (DRISDOL) 1.25 MG (50000 UNIT) CAPS capsule TAKE 1 CAPSULE (50,000 UNITS TOTAL) BY MOUTH ONCE A WEEK. 12 capsule 2   No current facility-administered medications for this visit.    Past Medical History:  Diagnosis Date   Absolute anemia 06/23/2018   Added automatically from request for surgery [id number]   Anemia    Anxiety    Backache 12/24/2007   Chronic back pain     Depression    Diabetes mellitus, type II ([place])    Headache(784.0)    Hypertension    Metabolic syndrome X 17/00/1749   Obesity    Palpitation    Sickle cell trait ([place])    Thyromegaly    Unspecified vitamin D deficiency 02/19/2013    Past Surgical History:  Procedure Laterality Date   COLONOSCOPY WITH PROPOFOL N/A 01/03/2021   Procedure: COLONOSCOPY WITH PROPOFOL;  Surgeon: Harvel Quale, MD;  Location: [facility];  Service: Gastroenterology;  Laterality: N/A;  AM   DILATATION AND CURETTAGE/HYSTEROSCOPY WITH MINERVA N/A 03/25/2019   Procedure: DILATATION AND CURETTAGE/HYSTEROSCOPY WITH  ATTEMPTED MINERVA ENDOMETRIAL ABLATION;  Surgeon: Florian Buff, MD;  Location: [facility]S;  Service: Gynecology;  Laterality: N/A;   None      Social History   Socioeconomic History   Marital status: Single    Spouse name: Not on file   Number of children: 2   Years of education: Not on file   Highest education level: Not on file  Occupational History   Occupation: employed in medical office   Tobacco Use   Smoking status: Never   Smokeless tobacco: Never  Vaping Use   Vaping Use: Never used  Substance and Sexual Activity   Alcohol use: No   Drug use: No   Sexual activity: Yes    Partners: Male    Birth control/protection: Other-see comments    Comment: bf had vasectomy  Other Topics Concern   Not on file  Social History Narrative   Not on file   Social Determinants of Health   Financial Resource Strain: Not on file  Food Insecurity: Not on file  Transportation Needs: Not on file  Physical Activity: Not on file  Stress: Not on file  Social Connections: Not on file  Intimate Partner Violence: Not on file      Current Meds  Medication Sig   acetaminophen (TYLENOL) 500 MG tablet Take 1,000 mg by mouth every 6 (six) hours as needed (pain).   ARIPiprazole (ABILIFY) 2 MG tablet Take 1 tablet (2 mg total) by mouth daily.   aspirin EC 81 MG tablet Take 1 tablet (81  mg total) by mouth daily. (Patient taking differently: Take 81 mg by mouth in the morning.)   benazepril (LOTENSIN) 5 MG tablet Take 1 tablet (5 mg total) by mouth daily.   blood glucose meter kit and supplies Dispense based on patient and insurance preference. Once daily testing dx e11.9   buPROPion (WELLBUTRIN XL) 150 MG 24 hr tablet Take 3 tablets (450 mg total) by mouth daily.   ergocalciferol (VITAMIN D2) 1.25 MG (50000 UT) capsule Take 1 capsule (50,000 Units total) by mouth once a week.  glipiZIDE (GLUCOTROL XL) 10 MG 24 hr tablet Take 2 tablets (20 mg total) by mouth daily with breakfast.   megestrol (MEGACE) 40 MG tablet TAKE 3 TABLETS BY MOUTH ONCE DAILY FOR 5 DAYS, THEN TAKE 2 TABLETS ONCE DAILY FOR 5 DAYS, THEN TAKE 1 TABLET ONCE DAILY. (Patient taking differently: Take 40 mg by mouth in the morning.)   metoprolol tartrate (LOPRESSOR) 25 MG tablet Take 1 tablet (25 mg total) by mouth daily.   rosuvastatin (CRESTOR) 5 MG tablet Take 1 tablet (5 mg total) by mouth daily.   SitaGLIPtin-MetFORMIN HCl (JANUMET XR) [phone] MG TB24 Take 1 tablet by mouth daily.   temazepam (RESTORIL) 30 MG capsule Take 1 capsule (30 mg total) by mouth at bedtime as needed for sleep.   triamterene-hydrochlorothiazide (MAXZIDE-25) 37.5-25 MG tablet TAKE 1 TABLET BY MOUTH DAILY. (Patient taking differently: Take 1 tablet by mouth in the morning.)   venlafaxine XR (EFFEXOR-XR) 150 MG 24 hr capsule Take 1 capsule (150 mg total) by mouth daily along with 75 mg capsule to equal 225 mg total daily.   venlafaxine XR (EFFEXOR-XR) 75 MG 24 hr capsule Take 1 capsule (75 mg total) by mouth daily with 150 mg capsule to equal 225 mg total daily.   Vitamin D, Ergocalciferol, (DRISDOL) 1.25 MG (50000 UNIT) CAPS capsule TAKE 1 CAPSULE (50,000 UNITS TOTAL) BY MOUTH ONCE A WEEK.   [DISCONTINUED] ARIPiprazole (ABILIFY) 5 MG tablet Take 5 mg by mouth daily.      Review of systems complete and found to be negative unless listed  above in HPI  Physical exam Blood pressure (!) 147/86, pulse 86, height 5' 5" (1.651 m), weight 194 lb (88 kg). GEN: Well nourished, well developed in no acute distress HEENT: Normal NECK: No JVD; No carotid bruits LYMPHATICS: No lymphadenopathy CARDIAC:RRR, no murmurs, rubs, gallops RESPIRATORY:  Clear to auscultation without rales, wheezing or rhonchi  ABDOMEN: Soft, non-tender, non-distended MUSCULOSKELETAL:  No edema; No deformity  SKIN: Warm and dry NEUROLOGIC:  Alert and oriented x 3 PSYCHIATRIC:  Normal affect    ECG: Most recent ECG reviewed that was done today and showed NSR with LVH by voltage criteria and lateral infarct.  Labs: Lab Results  Component Value Date/Time   K 3.9 05/09/2021 03:58 PM   BUN 12 05/09/2021 03:58 PM   CREATININE 1.16 (H) 05/09/2021 03:58 PM   CREATININE 0.95 03/06/2018 09:28 AM   ALT 20 09/01/2020 08:10 AM   TSH 1.400 09/01/2020 08:10 AM   HGB 12.1 05/09/2021 03:58 PM     Lipids: Lab Results  Component Value Date/Time   LDLCALC 80 09/01/2020 08:10 AM   LDLCALC 96 03/06/2018 09:28 AM   CHOL 122 09/01/2020 08:10 AM   TRIG 88 09/01/2020 08:10 AM   HDL 25 (L) 09/01/2020 08:10 AM        ASSESSMENT AND PLAN:  1.  Palpitations:  -This is been a long-term problem and seems to occur more around times of anxiety and depression flares  -She has been maintained on Lopressor 25 mg daily which I will change to twice daily since it is short acting -she has not had any problems with palpitations since she was seen last -Her mother has a history of atrial fibrillation.   -The echo was normal in 2020 with EF 60 to 65% with mild to moderate LVH  2.  Hypertension:  -BP is borderline controlled on exam today.   -She is on metoprolol tartrate but only taking once a day so  I have recommended increasing metoprolol tartrate to 25 mg twice daily and continue Maxide 37.5-25 mg daily as well as benazepril 5 mg daily with as needed refills  3.   Fatigue/sleep disordered breathing:  -She has complained in the past of significant fatigue, morning headaches, snoring, and daytime somnolence.   -She was referred to a sleep specialist for sleep study but it does not appear this ever happened  -I will get a home sleep study  4.  Cardiac risk factor counseling -she has multiple CRFs for CAD including DM, HTN, HLD and fm hx of premature CAD -I will get a coronary Ca score for risk assessment  Disposition: Followup 1 year  Signed: Kate Sable, M.D., F.A.C.C.  05/26/2021, 3:37 PM

## 2021-06-05 ENCOUNTER — Ambulatory Visit (HOSPITAL_COMMUNITY): Payer: No Typology Code available for payment source | Admitting: Psychiatry

## 2021-06-09 ENCOUNTER — Other Ambulatory Visit: Payer: Self-pay | Admitting: Family Medicine

## 2021-06-09 ENCOUNTER — Telehealth: Payer: Self-pay

## 2021-06-09 ENCOUNTER — Other Ambulatory Visit: Payer: Self-pay | Admitting: Nurse Practitioner

## 2021-06-09 DIAGNOSIS — F5104 Psychophysiologic insomnia: Secondary | ICD-10-CM

## 2021-06-09 NOTE — Telephone Encounter (Signed)
Requests refill, Mary Roman

## 2021-06-09 NOTE — Telephone Encounter (Signed)
Patient called she is completely out of this meds, need refill temazepam (RESTORIL) 30 MG capsule  Pharmacy:  [facility]

## 2021-06-09 NOTE — Telephone Encounter (Signed)
Patient called said [facility] told her it was denied by provider, Please contact patient at # [phone].   [facility]

## 2021-06-12 ENCOUNTER — Other Ambulatory Visit (HOSPITAL_COMMUNITY): Payer: Self-pay

## 2021-06-13 NOTE — Progress Notes (Signed)
Virtual Visit via Video Note  I connected with Mary Roman on 06/15/21 at  4:30 PM EST by a video enabled telemedicine application and verified that I am speaking with the correct person using two identifiers.  Location: Patient: home Provider: office Persons participated in the visit- patient, provider    I discussed the limitations of evaluation and management by telemedicine and the availability of in person appointments. The patient expressed understanding and agreed to proceed.    I discussed the assessment and treatment plan with the patient. The patient was provided an opportunity to ask questions and all were answered. The patient agreed with the plan and demonstrated an understanding of the instructions.   The patient was advised to call back or seek an in-person evaluation if the symptoms worsen or if the condition fails to improve as anticipated.  I provided 15 minutes of non-face-to-face time during this encounter.   [name], MD    [facility] MD/PA/NP OP Progress Note  06/15/2021 5:01 PM Mary Roman  MRN:  [id number]  Chief Complaint:  Chief Complaint   Depression; Follow-up    HPI:  - lopressor was uptitrated by cardiology for palpitation   This is a follow-up appointment for depression.  She states that she has been doing better since starting Abilify.  She is more motivated.  She went out to watch a movie on the weekend.  She has been stressed as her mother with colon cancer was admitted.  She had anemia secondary to blood loss.  Her mother is back home, and she and her siblings are taking care of her alternatively.  She feels guilty at times as she wished to help her sister, who helps her mother most of the time.  She also feels sad that other siblings does not understand her mental condition.  Although she has been able to go to work, she needs to force herself.  She has issues with focus, although she has not received any negative feedback.  She  sleeps better.  She has more energy.  She has increase in appetite, and has gained 5 pounds.  She denies SI.  She thinks Abilify is definitely helping her, and would like to try higher dose.  She is willing to work on diet and an exercise.    Wt Readings from Last 3 Encounters:  05/26/21 194 lb (88 kg)  05/09/21 189 lb 0.6 oz (85.7 kg)  01/03/21 185 lb (83.9 kg)     Visit Diagnosis:    ICD-10-CM   1. Major depressive disorder, recurrent episode, moderate (HCC)  F33.1     2. Insomnia, unspecified type  G47.00       Past Psychiatric History: Please see initial evaluation for full details. I have reviewed the history. No updates at this time.     Past Medical History:  Past Medical History:  Diagnosis Date   Absolute anemia 06/23/2018   Added automatically from request for surgery [id number]   Anemia    Anxiety    Backache 12/24/2007   Chronic back pain    Depression    Diabetes mellitus, type II ([place])    Headache(784.0)    Hypertension    Metabolic syndrome X 73/53/2992   Obesity    Palpitation    Sickle cell trait ([place])    Thyromegaly    Unspecified vitamin D deficiency 02/19/2013    Past Surgical History:  Procedure Laterality Date   COLONOSCOPY WITH PROPOFOL N/A 01/03/2021   Procedure: COLONOSCOPY WITH  PROPOFOL;  Surgeon: Montez Morita, Quillian Quince, MD;  Location: [facility];  Service: Gastroenterology;  Laterality: N/A;  AM   DILATATION AND CURETTAGE/HYSTEROSCOPY WITH MINERVA N/A 03/25/2019   Procedure: DILATATION AND CURETTAGE/HYSTEROSCOPY WITH  ATTEMPTED MINERVA ENDOMETRIAL ABLATION;  Surgeon: Florian Buff, MD;  Location: [facility] ORS;  Service: Gynecology;  Laterality: N/A;   None      Family Psychiatric History: Please see initial evaluation for full details. I have reviewed the history. No updates at this time.     Family History:  Family History  Problem Relation Age of Onset   Diabetes Mother    Hypertension Mother    Stroke Mother    Colon cancer Mother     Alcohol abuse Mother    Cancer - Colon Mother 51   Pneumonia Father    Depression Sister    Hypertension Sister    Anxiety disorder Sister    Hypertension Sister    Leukemia Sister    Heart attack Maternal Uncle    Hypertension Maternal Grandfather    Diabetes Maternal Grandmother    Hypertension Maternal Grandmother    Depression Child     Social History:  Social History   Socioeconomic History   Marital status: Single    Spouse name: Not on file   Number of children: 2   Years of education: Not on file   Highest education level: Not on file  Occupational History   Occupation: employed in medical office   Tobacco Use   Smoking status: Never   Smokeless tobacco: Never  Vaping Use   Vaping Use: Never used  Substance and Sexual Activity   Alcohol use: No   Drug use: No   Sexual activity: Yes    Partners: Male    Birth control/protection: Other-see comments    Comment: bf had vasectomy  Other Topics Concern   Not on file  Social History Narrative   Not on file   Social Determinants of Health   Financial Resource Strain: Not on file  Food Insecurity: Not on file  Transportation Needs: Not on file  Physical Activity: Not on file  Stress: Not on file  Social Connections: Not on file    Allergies: No Known Allergies  Metabolic Disorder Labs: Lab Results  Component Value Date   HGBA1C 7.3 (H) 05/09/2021   MPG 200.12 04/18/2020   MPG 206 01/13/2020   No results found for: PROLACTIN Lab Results  Component Value Date   CHOL 122 09/01/2020   TRIG 88 09/01/2020   HDL 25 (L) 09/01/2020   CHOLHDL 4.9 (H) 09/01/2020   VLDL 12 04/18/2020   LDLCALC 80 09/01/2020   LDLCALC 84 04/18/2020   Lab Results  Component Value Date   TSH 1.400 09/01/2020   TSH 1.916 05/13/2019    Therapeutic Level Labs: No results found for: LITHIUM No results found for: VALPROATE No components found for:  CBMZ  Current Medications: Current Outpatient Medications  Medication  Sig Dispense Refill   acetaminophen (TYLENOL) 500 MG tablet Take 1,000 mg by mouth every 6 (six) hours as needed (pain).     ARIPiprazole (ABILIFY) 5 MG tablet Take 1 tablet (5 mg total) by mouth daily. 30 tablet 1   aspirin EC 81 MG tablet Take 1 tablet (81 mg total) by mouth daily. (Patient taking differently: Take 81 mg by mouth in the morning.) 90 tablet 3   benazepril (LOTENSIN) 5 MG tablet Take 1 tablet (5 mg total) by mouth daily. 90 tablet 3  blood glucose meter kit and supplies Dispense based on patient and insurance preference. Once daily testing dx e11.9 1 each 0   buPROPion (WELLBUTRIN XL) 150 MG 24 hr tablet Take 3 tablets (450 mg total) by mouth daily. 270 tablet 1   ergocalciferol (VITAMIN D2) 1.25 MG (50000 UT) capsule Take 1 capsule (50,000 Units total) by mouth once a week. 12 capsule 1   glipiZIDE (GLUCOTROL XL) 10 MG 24 hr tablet Take 2 tablets (20 mg total) by mouth daily with breakfast. 180 tablet 1   megestrol (MEGACE) 40 MG tablet TAKE 3 TABLETS BY MOUTH ONCE DAILY FOR 5 DAYS, THEN TAKE 2 TABLETS ONCE DAILY FOR 5 DAYS, THEN TAKE 1 TABLET ONCE DAILY. (Patient taking differently: Take 40 mg by mouth in the morning.) 45 tablet 5   metoprolol tartrate (LOPRESSOR) 25 MG tablet Take 1 tablet (25 mg total) by mouth 2 (two) times daily. 180 tablet 3   rosuvastatin (CRESTOR) 5 MG tablet Take 1 tablet (5 mg total) by mouth daily. 90 tablet 3   SitaGLIPtin-MetFORMIN HCl (JANUMET XR) [phone] MG TB24 Take 1 tablet by mouth daily. 90 tablet 0   temazepam (RESTORIL) 30 MG capsule TAKE 1 CAPSULE BY MOUTH AT BEDTIME AS NEEDED FOR SLEEP 30 capsule 5   triamterene-hydrochlorothiazide (MAXZIDE-25) 37.5-25 MG tablet TAKE 1 TABLET BY MOUTH DAILY. (Patient taking differently: Take 1 tablet by mouth in the morning.) 90 tablet 3   venlafaxine XR (EFFEXOR-XR) 150 MG 24 hr capsule Take 1 capsule (150 mg total) by mouth daily along with 75 mg capsule to equal 225 mg total daily. 90 capsule 1    venlafaxine XR (EFFEXOR-XR) 75 MG 24 hr capsule Take 1 capsule (75 mg total) by mouth daily with 150 mg capsule to equal 225 mg total daily. 90 capsule 1   Vitamin D, Ergocalciferol, (DRISDOL) 1.25 MG (50000 UNIT) CAPS capsule TAKE 1 CAPSULE (50,000 UNITS TOTAL) BY MOUTH ONCE A WEEK. 12 capsule 2   No current facility-administered medications for this visit.     Musculoskeletal: Strength & Muscle Tone:  N/A Gait & Station:  N/A Patient leans: N/A  Psychiatric Specialty Exam: Review of Systems  Psychiatric/Behavioral:  Positive for decreased concentration and dysphoric mood. Negative for agitation, behavioral problems, confusion, hallucinations, self-injury, sleep disturbance and suicidal ideas. The patient is nervous/anxious. The patient is not hyperactive.   All other systems reviewed and are negative.  There were no vitals taken for this visit.There is no height or weight on file to calculate BMI.  General Appearance: Fairly Groomed  Eye Contact:  Good  Speech:  Clear and Coherent  Volume:  Normal  Mood:   better  Affect:  Appropriate, Congruent, and less down, smile  Thought Process:  Coherent  Orientation:  Full (Time, Place, and Person)  Thought Content: Logical   Suicidal Thoughts:  No  Homicidal Thoughts:  No  Memory:  Immediate;   Good  Judgement:  Good  Insight:  Good  Psychomotor Activity:  Normal  Concentration:  Concentration: Good and Attention Span: Good  Recall:  Good  Fund of Knowledge: Good  Language: Good  Akathisia:  No  Handed:  Right  AIMS (if indicated): not done  Assets:  Communication Skills Desire for Improvement  ADL's:  Intact  Cognition: WNL  Sleep:  Fair   Screenings: GAD-7    Flowsheet Row Counselor from 05/15/2018 in [facility] Office Visit from 01/08/2017 in [facility]  Total GAD-7 Score 17 14  [place] Office Visit from 05/09/2021 in [facility] from 04/11/2021 in [facility] [place] Office Visit from 12/20/2020 in [facility] Video Visit from 11/28/2020 in [place] Video Visit from 11/08/2020 in [facility]  PHQ-2 Total Score 4 6 1 2 2   PHQ-9 Total Score 10 21 -- 6 9      Flowsheet Row Video Visit from 05/04/2021 in [facility] Counselor from 04/11/2021 in [facility] Ad[facility] (Discharged) from 01/03/2021 in [facility] CATEGORY Low Risk No Risk Error: Question 6 not populated        Assessment and Plan:  Mary Roman is a 50 y.o. year old female with a history of depression, diabetes, who presents for follow up appointment for below.   1. Major depressive disorder, recurrent episode, moderate (HCC) Exam is notable for brighter affect, and there has been significant improvement in depressive symptoms since starting Abilify.  Recent psychosocial stressors includes her mother's recent admission, who has cancer.  Although she has had weight gain, which is likely secondary to Abilify, she would like to try higher dose given that it has been very beneficial for her.  Discussed potential metabolic side effect and EPS.  She is not interested in topiramate to mitigate this potential side effect, but is willing to work on diet and an exercise.  Will continue venlafaxine and bupropion to target depression.  Coached behavioral activation.   2. Insomnia, unspecified type Improving.  She has an upcoming evaluation for sleep apnea.    Plan Continue venlafaxine 225 mg daily  Continue bupropion 450 mg daily Increase Abilify 5 mg daily  Next appointment: 1/12 at 4:30 for 30 mins, video -on  temazepam 30 mg at night as needed for sleep - on gabapentin for pain and hydroxyzine   Past trials of medication: fluoxetine, duloxetine,  Abilify (weight gain),  buspirone, Trazodone ("weird" feeling) temazepam, Xanax      The patient demonstrates the following risk factors for suicide: Chronic risk factors for suicide include: psychiatric disorder of depression, anxiety and chronic pain. Acute risk factors for suicide include: N/A. Protective factors for this patient include: positive social support, responsibility to others (children, family), coping skills and hope for the future. Considering these factors, the overall suicide risk at this point appears to be low. Patient is appropriate for outpatient follow up.      [name], MD 06/15/2021, 5:01 PM

## 2021-06-15 ENCOUNTER — Other Ambulatory Visit (HOSPITAL_COMMUNITY): Payer: Self-pay

## 2021-06-15 ENCOUNTER — Encounter: Payer: Self-pay | Admitting: Psychiatry

## 2021-06-15 ENCOUNTER — Other Ambulatory Visit: Payer: Self-pay

## 2021-06-15 ENCOUNTER — Telehealth (INDEPENDENT_AMBULATORY_CARE_PROVIDER_SITE_OTHER): Payer: No Typology Code available for payment source | Admitting: Psychiatry

## 2021-06-15 DIAGNOSIS — G4700 Insomnia, unspecified: Secondary | ICD-10-CM

## 2021-06-15 DIAGNOSIS — F331 Major depressive disorder, recurrent, moderate: Secondary | ICD-10-CM | POA: Diagnosis not present

## 2021-06-15 MED ORDER — BUPROPION HCL ER (XL) 150 MG PO TB24
450.0000 mg | ORAL_TABLET | Freq: Every day | ORAL | 1 refills | Status: DC
  Filled 2021-06-15: qty 270, 90d supply, fill #0
  Filled 2022-03-13: qty 270, 90d supply, fill #1

## 2021-06-15 MED ORDER — ARIPIPRAZOLE 5 MG PO TABS
5.0000 mg | ORAL_TABLET | Freq: Every day | ORAL | 1 refills | Status: DC
  Filled 2021-06-15: qty 30, 30d supply, fill #0
  Filled 2021-08-10: qty 30, 30d supply, fill #1

## 2021-06-15 NOTE — Patient Instructions (Signed)
Continue venlafaxine 225 mg daily  Continue bupropion 450 mg daily Increase Abilify 5 mg daily  Next appointment: 1/12 at 4:30

## 2021-06-19 ENCOUNTER — Ambulatory Visit: Payer: No Typology Code available for payment source | Attending: Cardiology | Admitting: Cardiology

## 2021-06-19 ENCOUNTER — Other Ambulatory Visit: Payer: Self-pay

## 2021-06-19 DIAGNOSIS — G4733 Obstructive sleep apnea (adult) (pediatric): Secondary | ICD-10-CM | POA: Diagnosis not present

## 2021-06-19 DIAGNOSIS — G4719 Other hypersomnia: Secondary | ICD-10-CM

## 2021-06-25 MED FILL — Triamterene & Hydrochlorothiazide Tab 37.5-25 MG: ORAL | 90 days supply | Qty: 90 | Fill #1 | Status: AC

## 2021-06-25 NOTE — Procedures (Signed)
   Patient Name: Mary Roman, [place] Date: 06/19/2021 Gender: Female D.O.B: 23-Sep-1970 Age (years): 89 Referring Provider: Fransico Him MD, ABSM Height (inches): 65 Interpreting Physician: Fransico Him MD, ABSM Weight (lbs): 194 RPSGT: Peak, Robert BMI: 32 MRN: [id number]  CLINICAL INFORMATION Sleep Study Type: HST  Indication for sleep study: N/A  Epworth Sleepiness Score: N/A  SLEEP STUDY TECHNIQUE A multi-channel overnight portable sleep study was performed. The channels recorded were: nasal airflow, thoracic respiratory movement, and oxygen saturation with a pulse oximetry. Snoring was also monitored.  MEDICATIONS Patient self administered medications include: N/A.  SLEEP ARCHITECTURE Patient was studied for 389.5 minutes. The sleep efficiency was 81.2 % and the patient was supine for 19.3%. The arousal index was 0.0 per hour.  RESPIRATORY PARAMETERS The overall AHI was 8.9 per hour, with a central apnea index of 0 per hour.  The oxygen nadir was 87% during sleep.  CARDIAC DATA Mean heart rate during sleep was 80.8 bpm.  IMPRESSIONS - Mild obstructive sleep apnea occurred during this study (AHI = 8.9/h). - Mild oxygen desaturation was noted during this study (Min O2 = 87%). - Patient snored 5.7% during the sleep.  DIAGNOSIS - Obstructive Sleep Apnea (G47.33)  RECOMMENDATIONS - Therapeutic CPAP titration to determine optimal pressure required to alleviate sleep disordered breathing. - Positional therapy avoiding supine position during sleep. - Oral appliance may be considered. - Avoid alcohol, sedatives and other CNS depressants that may worsen sleep apnea and disrupt normal sleep architecture. - Sleep hygiene should be reviewed to assess factors that may improve sleep quality. - Weight management and regular exercise should be initiated or continued. - Return to Sleep Center to discuss the results of this study  [Electronically signed] 06/25/2021 07:17  PM  Fransico Him MD, ABSM Diplomate, American Board of Sleep Medicine

## 2021-06-26 ENCOUNTER — Ambulatory Visit (INDEPENDENT_AMBULATORY_CARE_PROVIDER_SITE_OTHER): Payer: No Typology Code available for payment source | Admitting: Psychiatry

## 2021-06-26 ENCOUNTER — Other Ambulatory Visit: Payer: Self-pay

## 2021-06-26 ENCOUNTER — Other Ambulatory Visit (HOSPITAL_COMMUNITY): Payer: Self-pay

## 2021-06-26 DIAGNOSIS — F331 Major depressive disorder, recurrent, moderate: Secondary | ICD-10-CM

## 2021-06-26 NOTE — Progress Notes (Signed)
Virtual Visit via Video Note  I connected with Mary Roman on 06/26/21 at 2::10 PM EST  by a video enabled telemedicine application and verified that I am speaking with the correct person using two identifiers.  Location: Patient: Home Provider: [place] office    I discussed the limitations of evaluation and management by telemedicine and the availability of in person appointments. The patient expressed understanding and agreed to proceed.  I provided 30  minutes of non-face-to-face time during this encounter.   [name], LCSW      THERAPIST PROGRESS NOTE  Session Time: Monday 06/26/2021 2:10 PM - 2:40 PM   Participation Level: Active  Behavioral Response: CasualAlert/Depressed/Anxious/  Type of Therapy: Individual Therapy  Treatment Goals addressed:  Reduce frequency, intensity, and duration of depression symptoms as evidenced by patient having interaction or doing recreational activities with her children 2 times per week.   Interventions: CBT and Supportive  Summary: Mary Roman is a 50 y.o. female who initially was referred for services by psychiatrist Dr. Modesta Messing.  Patient reports experiencing symptoms of depression intermittently for the past 7 to 8 years.  She reports participating in therapy twice with [facility] counselor in the past.  She reports no psychiatric hospitalizations.  She began taking psychotropic medications many years ago as prescribed by PCP.  She is a returning patient to this clinician and last was seen in 2020.  She is resuming services per recommendation from Dr. Modesta Messing.  Patient states they have not been able to find the right medication cocktail.  She reports still feeling depressed, anxious, and hopeless all the time.  She states barely being able to make it to work.  She reports stress related to family dynamics including issues with her son and daughter.  Her son is suffering from depression and her daughter  internalizes her feelings.  She also reports stress regarding caretaker issues regarding her mother.  Patient last was seen via virtual visit about 6 weeks ago for the assessment appointment.  She reports feeling less depressed and less anxious since taking increased dosage of Abilify as instructed by psychiatrist Dr. Modesta Messing.  She reports improved medication compliance.  Patient reports improving self-care especially eating patterns and reports trying to avoid skipping breakfast.  She reports having more energy.  She states being more focused at work and being more motivated.  She reports implementing plan developed last session about taking her daughter to the movies and says her son also decided to go.  Patient reports using strategies discussed in session to cope with unhelpful thoughts and to commit to her plan.  Patient reports enjoying going to the movies very much and says she has gone again with her son.  She also reports enjoying going thrift shopping with her daughter this past weekend.    Suicidal/Homicidal: Nowithout intent/plan,   Therapist Response: Reviewed symptoms, discussed stressors, facilitated expression of thoughts and feelings, validated feelings, praised and reinforced patient's improved self-care regarding eating patterns and increased behavioral activation, discussed effects on her mood/thoughts/behavior, assisted patient identify ways to maintain consistent efforts including planning to go to bed early/get up early to eat breakfast, reduce caffeine intake, and participate in 1 pleasurable activity with family per week, reviewed ACT principles and cognitive defusion to help patient cope with unhelpful thoughts  Plan: Return again in 1- 2 weeks.   Diagnosis: Axis I: MDD    Axis II: Deferred    [name], LCSW 06/26/2021

## 2021-06-27 ENCOUNTER — Telehealth: Payer: Self-pay | Admitting: *Deleted

## 2021-06-27 NOTE — Telephone Encounter (Signed)
-----   Message from Sueanne Margarita, MD sent at 06/25/2021  7:19 PM EST ----- Patient has very mild OSA - set up OV to discuss treatment options.

## 2021-06-27 NOTE — Telephone Encounter (Signed)
The patient has been notified of the result and verbalized understanding.  All questions (if any) were answered. Marolyn Hammock, [place] 06/27/2021 5:30 PM   Patient has an [place] appointment for 08/15/21.

## 2021-06-28 ENCOUNTER — Other Ambulatory Visit: Payer: Self-pay | Admitting: Obstetrics & Gynecology

## 2021-07-12 ENCOUNTER — Ambulatory Visit (HOSPITAL_COMMUNITY): Payer: No Typology Code available for payment source

## 2021-07-18 ENCOUNTER — Other Ambulatory Visit: Payer: Self-pay

## 2021-07-18 ENCOUNTER — Ambulatory Visit: Payer: No Typology Code available for payment source | Admitting: Family Medicine

## 2021-07-18 ENCOUNTER — Encounter: Payer: Self-pay | Admitting: Family Medicine

## 2021-07-18 ENCOUNTER — Ambulatory Visit (INDEPENDENT_AMBULATORY_CARE_PROVIDER_SITE_OTHER): Payer: No Typology Code available for payment source | Admitting: Family Medicine

## 2021-07-18 VITALS — BP 126/84 | HR 82 | Resp 17 | Ht 65.0 in | Wt 194.1 lb

## 2021-07-18 DIAGNOSIS — Z1231 Encounter for screening mammogram for malignant neoplasm of breast: Secondary | ICD-10-CM | POA: Diagnosis not present

## 2021-07-18 DIAGNOSIS — Z23 Encounter for immunization: Secondary | ICD-10-CM

## 2021-07-18 DIAGNOSIS — Z0000 Encounter for general adult medical examination without abnormal findings: Secondary | ICD-10-CM

## 2021-07-18 DIAGNOSIS — I10 Essential (primary) hypertension: Secondary | ICD-10-CM

## 2021-07-18 DIAGNOSIS — E1159 Type 2 diabetes mellitus with other circulatory complications: Secondary | ICD-10-CM | POA: Diagnosis not present

## 2021-07-18 NOTE — Assessment & Plan Note (Signed)
Mary Roman is reminded of the importance of commitment to daily physical activity for 30 minutes or more, as able and the need to limit carbohydrate intake to 30 to 60 grams per meal to help with blood sugar control.   The need to take medication as prescribed, test blood sugar as directed, and to call between visits if there is a concern that blood sugar is uncontrolled is also discussed.   Mary Roman is reminded of the importance of daily foot exam, annual eye examination, and good blood sugar, blood pressure and cholesterol control.  Diabetic Labs Latest Ref Rng & Units 05/09/2021 01/03/2021 01/02/2021 12/14/2020 09/01/2020  HbA1c 4.8 - 5.6 % 7.3(H) - - 7.1(H) 10.4(H)  Microalbumin Not Estab. ug/mL - - - - -  Micro/Creat Ratio 0 - 29 mg/g creat - - - - -  Chol 100 - 199 mg/dL - - - - 122  HDL >39 mg/dL - - - - 25(L)  Calc LDL 0 - 99 mg/dL - - - - 80  Triglycerides 0 - 149 mg/dL - - - - 88  Creatinine 0.57 - 1.00 mg/dL 1.16(H) 1.10(H) 1.27(H) 1.01(H) 1.31(H)   BP/Weight 07/18/2021 05/26/2021 05/09/2021 01/03/2021 12/20/2020 1/89/8421 0/09/1279  Systolic BP 188 677 373 668 159 470 761  Diastolic BP 84 86 88 78 80 84 82  Wt. (Lbs) 194.12 194 189.04 185 185 189.4 197  BMI 32.3 32.28 31.46 30.79 30.79 31.52 32.78  Some encounter information is confidential and restricted. Go to Review Flowsheets activity to see all data.   Foot/eye exam completion dates Latest Ref Rng & Units 07/18/2021 09/26/2020  Eye Exam No Retinopathy - No Retinopathy  Foot Form Completion - Done -

## 2021-07-18 NOTE — Assessment & Plan Note (Signed)

## 2021-07-18 NOTE — Patient Instructions (Addendum)
F/U end Jan , call if you need me sooner  Please schedule your Jan mammogram  You are referred for Feb eye exam  HBA1C, fasting lipid, cmp and EGFR, CBC, tSH 5 days before next appt  It is important that you exercise regularly at least 30 minutes 5 times a week. If you develop chest pain, have severe difficulty breathing, or feel very tired, stop exercising immediately and seek medical attention   Think about what you will eat, plan ahead. Choose " clean, green, fresh or frozen" over canned, processed or packaged foods which are more sugary, salty and fatty. 70 to 75% of food eaten should be vegetables and fruit. Three meals at set times with snacks allowed between meals, but they must be fruit or vegetables. Aim to eat over a 12 hour period , example 7 am to 7 pm, and STOP after  your last meal of the day. Drink water,generally about 64 ounces per day, no other drink is as healthy. Fruit juice is best enjoyed in a healthy way, by EATING the fruit.   Thankful depression much improved

## 2021-07-19 ENCOUNTER — Encounter: Payer: Self-pay | Admitting: Family Medicine

## 2021-07-19 ENCOUNTER — Ambulatory Visit (INDEPENDENT_AMBULATORY_CARE_PROVIDER_SITE_OTHER): Payer: No Typology Code available for payment source | Admitting: Psychiatry

## 2021-07-19 DIAGNOSIS — F331 Major depressive disorder, recurrent, moderate: Secondary | ICD-10-CM | POA: Diagnosis not present

## 2021-07-19 NOTE — Progress Notes (Signed)
Virtual Visit via Video Note  I connected with Mary Roman on 07/19/21 at 4:10 PM EST by a video enabled telemedicine application and verified that I am speaking with the correct person using two identifiers.  Location: Patient: Home Provider: [place] office    I discussed the limitations of evaluation and management by telemedicine and the availability of in person appointments. The patient expressed understanding and agreed to proceed.  I provided 34 minutes of non-face-to-face time during this encounter.   [name], LCSW      THERAPIST PROGRESS NOTE  Session Time: Wednesday 07/19/2021 4:10 PM -  4:44 PM   Participation Level: Active  Behavioral Response: CasualAlert/Depressed/Anxious/  Type of Therapy: Individual Therapy  Treatment Goals addressed:  Reduce frequency, intensity, and duration of depression symptoms as evidenced by patient having interaction or doing recreational activities with her children 2 times per week.   Interventions: CBT and Supportive  Summary: NA Mary Roman is a 50 y.o. female who initially was referred for services by psychiatrist Dr. Modesta Messing.  Patient reports experiencing symptoms of depression intermittently for the past 7 to 8 years.  She reports participating in therapy twice with E[facility] counselor in the past.  She reports no psychiatric hospitalizations.  She began taking psychotropic medications many years ago as prescribed by PCP.  She is a returning patient to this clinician and last was seen in 2020.  She is resuming services per recommendation from Dr. Modesta Messing.  Patient states they have not been able to find the right medication cocktail.  She reports still feeling depressed, anxious, and hopeless all the time.  She states barely being able to make it to work.  She reports stress related to family dynamics including issues with her son and daughter.  Her son is suffering from depression and her daughter  internalizes her feelings.  She also reports stress regarding caretaker issues regarding her mother.  Patient last was seen via virtual visit about 3-4 weeks ago. She reports continued improved mood and decreased symptoms of depression as reflected in her score on the PHQ-9 that was administered yesterday.  Patient reports medication seems to be very helpful.  She has continued to increase behavioral activation.  She implemented plan of going to bed early.  She also has been eating breakfast in the mornings.  She has reduced caffeine intake to 1 cola per day.  She has participated in pleasurable activities with her family such as going out to eat, having family conversations, as well as enjoying watching the football game on Sundays with her family.  Patient also reports enjoying celebrating Thanksgiving with her extended family.  Patient reports increased energy and feeling more hopeful.  She reports experiencing 1 to 2 days of feeling hopelessness per week.  She uses self talk and behavioral activation to manage.  Patient is looking forward to Christmas and a week's vacation Suicidal/Homicidal: Nowithout intent/plan,   Therapist Response: Reviewed symptoms, praised and reinforced patient's increased involvement in activities/behavioral activation/socialization/improved self-care, discussed effects on her mood/thoughts/behavior as well as her interaction with her family, develop plan with patient to maintain consistent efforts with the use of daily planning, also assisted patient identify/challenge/and replace unhelpful thoughts with helpful thoughts regarding should statements that triggered depressive mood   Plan: Return again in 1- 2 weeks.   Diagnosis: Axis I: MDD    Axis II: Deferred    [name], LCSW 07/19/2021

## 2021-07-19 NOTE — Progress Notes (Signed)
Mary Roman     MRN: [id number]      DOB: Jun 10, 1971  HPI: Patient is in for annual physical exam. No other health concerns are expressed or addressed at the visit. Recent labs,  are reviewed. Immunization is reviewed , and  updated if needed.   PE: BP 126/84    Pulse 82    Resp 17    Ht 5\' 5"  (1.651 m)    Wt 194 lb 1.9 oz (88.1 kg)    SpO2 98%    BMI 32.30 kg/m   Pleasant  female, alert and oriented x 3, in no cardio-pulmonary distress. Afebrile. HEENT No facial trauma or asymetry. Sinuses non tender.  Extra occullar muscles intact.. External ears normal, . Neck: supple, no adenopathy,JVD or thyromegaly.No bruits.  Chest: Clear to ascultation bilaterally.No crackles or wheezes. Non tender to palpation  Cardiovascular system; Heart sounds normal,  S1 and  S2 ,no S3.  No murmur, or thrill. Apical beat not displaced Peripheral pulses normal.  Abdomen: Soft, non tender, no organomegaly or masses. No bruits. Bowel sounds normal. No guarding, tenderness or rebound.     Musculoskeletal exam: Full ROM of spine, hips , shoulders and knees. No deformity ,swelling or crepitus noted. No muscle wasting or atrophy.   Neurologic: Cranial nerves 2 to 12 intact. Power, tone ,sensation and reflexes normal throughout. No disturbance in gait. No tremor.  Skin: Intact, no ulceration, erythema , scaling or rash noted. Pigmentation normal throughout  Psych; Normal mood and affect. Judgement and concentration normal   Assessment & Plan:  Annual physical exam Annual exam as documented. Counseling done  re healthy lifestyle involving commitment to 150 minutes exercise per week, heart healthy diet, and attaining healthy weight.The importance of adequate sleep also discussed. Regular seat belt use and home safety, is also discussed. Changes in health habits are decided on by the patient with goals and time frames  set for achieving them. Immunization and cancer  screening needs are specifically addressed at this visit.   Type 2 diabetes mellitus with vascular disease ([place]) Mary Roman is reminded of the importance of commitment to daily physical activity for 30 minutes or more, as able and the need to limit carbohydrate intake to 30 to 60 grams per meal to help with blood sugar control.   The need to take medication as prescribed, test blood sugar as directed, and to call between visits if there is a concern that blood sugar is uncontrolled is also discussed.   Mary Roman is reminded of the importance of daily foot exam, annual eye examination, and good blood sugar, blood pressure and cholesterol control.  Diabetic Labs Latest Ref Rng & Units 05/09/2021 01/03/2021 01/02/2021 12/14/2020 09/01/2020  HbA1c 4.8 - 5.6 % 7.3(H) - - 7.1(H) 10.4(H)  Microalbumin Not Estab. ug/mL - - - - -  Micro/Creat Ratio 0 - 29 mg/g creat - - - - -  Chol 100 - 199 mg/dL - - - - 122  HDL >39 mg/dL - - - - 25(L)  Calc LDL 0 - 99 mg/dL - - - - 80  Triglycerides 0 - 149 mg/dL - - - - 88  Creatinine 0.57 - 1.00 mg/dL 1.16(H) 1.10(H) 1.27(H) 1.01(H) 1.31(H)   BP/Weight 07/18/2021 05/26/2021 05/09/2021 01/03/2021 12/20/2020 11/08/8784 02/02/7208  Systolic BP 470 962 836 629 476 546 503  Diastolic BP 84 86 88 78 80 84 82  Wt. (Lbs) 194.12 194 189.04 185 185 189.4 197  BMI 32.3 32.28 31.46  30.79 30.79 31.52 32.78  Some encounter information is confidential and restricted. Go to Review Flowsheets activity to see all data.   Foot/eye exam completion dates Latest Ref Rng & Units 07/18/2021 09/26/2020  Eye Exam No Retinopathy - No Retinopathy  Foot Form Completion - Done -

## 2021-08-02 ENCOUNTER — Other Ambulatory Visit: Payer: Self-pay

## 2021-08-02 ENCOUNTER — Ambulatory Visit (INDEPENDENT_AMBULATORY_CARE_PROVIDER_SITE_OTHER): Payer: No Typology Code available for payment source | Admitting: Psychiatry

## 2021-08-02 DIAGNOSIS — F331 Major depressive disorder, recurrent, moderate: Secondary | ICD-10-CM

## 2021-08-02 NOTE — Progress Notes (Signed)
Virtual Visit via Telephone Note  I connected with Mary Roman on 08/02/21 at 4:12 PM EST by telephone and verified that I am speaking with the correct person using two identifiers.  Location: Patient: Home Provider: [place] office    I discussed the limitations, risks, security and privacy concerns of performing an evaluation and management service by telephone and the availability of in person appointments. I also discussed with the patient that there may be a patient responsible charge related to this service. The patient expressed understanding and agreed to proceed.   I provided 43 minutes of non-face-to-face time during this encounter.   [name], LCSW     THERAPIST PROGRESS NOTE  Session Time: Wednesday 08/02/2021/2023 4:12 PM - 4:55 PM   Participation Level: Active  Behavioral Response: CasualAlert/Depressed/Anxious/  Type of Therapy: Individual Therapy  Treatment Goals addressed:  Reduce frequency, intensity, and duration of depression symptoms as evidenced by patient having interaction or doing recreational activities with her children 2 times per week.   Interventions: CBT and Supportive  Summary: Mary Roman is a 51 y.o. female who initially was referred for services by psychiatrist Dr. Modesta Messing.  Patient reports experiencing symptoms of depression intermittently for the past 7 to 8 years.  She reports participating in therapy twice with E[facility] counselor in the past.  She reports no psychiatric hospitalizations.  She began taking psychotropic medications many years ago as prescribed by PCP.  She is a returning patient to this clinician and last was seen in 2020.  She is resuming services per recommendation from Dr. Modesta Messing.  Patient states they have not been able to find the right medication cocktail.  She reports still feeling depressed, anxious, and hopeless all the time.  She states barely being able to make it to work.  She reports  stress related to family dynamics including issues with her son and daughter.  Her son is suffering from depression and her daughter internalizes her feelings.  She also reports stress regarding caretaker issues regarding her mother.  Patient last was seen via virtual visit about 3-4 weeks ago. She reports continued symptoms of depression and states her spirits have not been as up as they were prior to last session.  Per patient's report, this was triggered by family issues during the holidays as well as continued concerns about her mother.  Patient expresses frustration, anger, and sadness regarding 2 of her siblings not participating in helping provide care for their mother.  She also expresses frustration regarding the effects of these 2 siblings's behavior on their mother.  Reports using distracting activities and continued positive self-care to try to cope.  She also reports talking with one of her other siblings who is very supportive and understanding.  Patient reports continued interaction with her immediate family and reports recently enjoying going thrift shopping with her daughter.  She also reports enjoying her week of vacation during the holidays.    Suicidal/Homicidal: Nowithout intent/plan,   Therapist Response: Reviewed symptoms, praised and reinforced patient's continued involvement in activities/improved self-care, praised and reinforced patient's increased awareness of mood and her use of helpful coping strategies to manage, assisted patient examine her thought patterns and expectations regarding her siblings lack of participation in mother's care, assisted patient identify realistic expectations of siblings, assisted patient replace should and ought statements with wish and prefer, assisted patient identify realistic expectations of self and providing care and supporting mother, developed plan with patient to practice strategies discussed in session,  developed plan with patient to continue  behavioral activation and positive self-care   Plan: Return again in 1- 2 weeks.   Diagnosis: Axis I: MDD    Axis II: Deferred          [name], LCSW 08/02/2021

## 2021-08-09 NOTE — Progress Notes (Signed)
Virtual Visit via Video Note  I connected with Mary Roman on 08/10/21 at  4:30 PM EST by a video enabled telemedicine application and verified that I am speaking with the correct person using two identifiers.  Location: Patient: home Provider: office Persons participated in the visit- patient, provider    I discussed the limitations of evaluation and management by telemedicine and the availability of in person appointments. The patient expressed understanding and agreed to proceed.     I discussed the assessment and treatment plan with the patient. The patient was provided an opportunity to ask questions and all were answered. The patient agreed with the plan and demonstrated an understanding of the instructions.   The patient was advised to call back or seek an in-person evaluation if the symptoms worsen or if the condition fails to improve as anticipated.  I provided 18 minutes of non-face-to-face time during this encounter.   [name], MD    [facility] MD/PA/NP OP Progress Note  08/10/2021 4:58 PM Mary Roman  MRN:  [id number]  Chief Complaint:  Chief Complaint   Follow-up; Depression    HPI:  This is a follow-up appointment for depression.  She states that her mood has been a lot better.  However, she was sad around Christmas.  She tends to feel this way every year, although she does not know why.  She also states that she had a meeting with her siblings.  She reports her concern that her other siblings except her sister in [place] is not getting involved in the care of her mother.  She tries to help her sister as she would need to have some time off.  She states that her brother, who lives with her mother is sometimes not nice to her mother while he is entitled.  She also states that other siblings have issues with drugs.  She had good time with her mother on her mother's birthday.  She states that the work has been going well.  She has not been able to do  exercise due to lack of energy.  She continues to have increase in appetite, and feels that her clothes is getting tight.  She has depressive symptoms as in PHQ-9.  She denies SI.  She is willing to try rexulti at this time.   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  one son, one daughter  Wt Readings from Last 3 Encounters:  07/18/21 194 lb 1.9 oz (88.1 kg)  05/26/21 194 lb (88 kg)  05/09/21 189 lb 0.6 oz (85.7 kg)     Visit Diagnosis: No diagnosis found.  Past Psychiatric History: Please see initial evaluation for full details. I have reviewed the history. No updates at this time.     Past Medical History:  Past Medical History:  Diagnosis Date   Absolute anemia 06/23/2018   Added automatically from request for surgery [id number]   Anemia    Anxiety    Backache 12/24/2007   Chronic back pain    Depression    Diabetes mellitus, type II ([place])    Headache(784.0)    Hypertension    Metabolic syndrome X 17/91/5056   Obesity    Palpitation    Sickle cell trait ([place])    Thyromegaly    Unspecified vitamin D deficiency 02/19/2013    Past Surgical History:  Procedure Laterality Date   COLONOSCOPY WITH PROPOFOL N/A 01/03/2021  Procedure: COLONOSCOPY WITH PROPOFOL;  Surgeon: Harvel Quale, MD;  Location: [facility];  Service: Gastroenterology;  Laterality: N/A;  AM   DILATATION AND CURETTAGE/HYSTEROSCOPY WITH MINERVA N/A 03/25/2019   Procedure: DILATATION AND CURETTAGE/HYSTEROSCOPY WITH  ATTEMPTED MINERVA ENDOMETRIAL ABLATION;  Surgeon: Florian Buff, MD;  Location: [facility] ORS;  Service: Gynecology;  Laterality: N/A;   None      Family Psychiatric History: Please see initial evaluation for full details. I have reviewed the history. No updates at this time.     Family History:  Family History  Problem Relation Age of Onset   Diabetes Mother     Hypertension Mother    Stroke Mother    Colon cancer Mother    Alcohol abuse Mother    Cancer - Colon Mother 25   Pneumonia Father    Depression Sister    Hypertension Sister    Anxiety disorder Sister    Hypertension Sister    Leukemia Sister    Heart attack Maternal Uncle    Hypertension Maternal Grandfather    Diabetes Maternal Grandmother    Hypertension Maternal Grandmother    Depression Child     Social History:  Social History   Socioeconomic History   Marital status: Single    Spouse name: Not on file   Number of children: 2   Years of education: Not on file   Highest education level: Not on file  Occupational History   Occupation: employed in medical office   Tobacco Use   Smoking status: Never   Smokeless tobacco: Never  Vaping Use   Vaping Use: Never used  Substance and Sexual Activity   Alcohol use: No   Drug use: No   Sexual activity: Yes    Partners: Male    Birth control/protection: Other-see comments    Comment: bf had vasectomy  Other Topics Concern   Not on file  Social History Narrative   Not on file   Social Determinants of Health   Financial Resource Strain: Not on file  Food Insecurity: Not on file  Transportation Needs: Not on file  Physical Activity: Not on file  Stress: Not on file  Social Connections: Not on file    Allergies: No Known Allergies  Metabolic Disorder Labs: Lab Results  Component Value Date   HGBA1C 7.3 (H) 05/09/2021   MPG 200.12 04/18/2020   MPG 206 01/13/2020   No results found for: PROLACTIN Lab Results  Component Value Date   CHOL 122 09/01/2020   TRIG 88 09/01/2020   HDL 25 (L) 09/01/2020   CHOLHDL 4.9 (H) 09/01/2020   VLDL 12 04/18/2020   LDLCALC 80 09/01/2020   LDLCALC 84 04/18/2020   Lab Results  Component Value Date   TSH 1.400 09/01/2020   TSH 1.916 05/13/2019    Therapeutic Level Labs: No results found for: LITHIUM No results found for: VALPROATE No components found for:   CBMZ  Current Medications: Current Outpatient Medications  Medication Sig Dispense Refill   Brexpiprazole (REXULTI) 0.5 MG TABS Take 1 tablet (0.5 mg total) by mouth at bedtime. 30 tablet 1   acetaminophen (TYLENOL) 500 MG tablet Take 1,000 mg by mouth every 6 (six) hours as needed (pain).     aspirin EC 81 MG tablet Take 1 tablet (81 mg total) by mouth daily. (Patient taking differently: Take 81 mg by mouth in the morning.) 90 tablet 3   benazepril (LOTENSIN) 5 MG tablet Take 1 tablet (5 mg total) by mouth  daily. 90 tablet 3   blood glucose meter kit and supplies Dispense based on patient and insurance preference. Once daily testing dx e11.9 1 each 0   buPROPion (WELLBUTRIN XL) 150 MG 24 hr tablet Take 3 tablets (450 mg total) by mouth daily. 270 tablet 1   ergocalciferol (VITAMIN D2) 1.25 MG (50000 UT) capsule Take 1 capsule (50,000 Units total) by mouth once a week. 12 capsule 1   glipiZIDE (GLUCOTROL XL) 10 MG 24 hr tablet Take 2 tablets (20 mg total) by mouth daily with breakfast. 180 tablet 1   megestrol (MEGACE) 40 MG tablet Take 1 tablet (40 mg total) by mouth in the morning. 30 tablet 11   metoprolol tartrate (LOPRESSOR) 25 MG tablet Take 1 tablet (25 mg total) by mouth 2 (two) times daily. 180 tablet 3   rosuvastatin (CRESTOR) 5 MG tablet Take 1 tablet (5 mg total) by mouth daily. 90 tablet 3   SitaGLIPtin-MetFORMIN HCl (JANUMET XR) [phone] MG TB24 Take 1 tablet by mouth daily. 90 tablet 0   temazepam (RESTORIL) 30 MG capsule TAKE 1 CAPSULE BY MOUTH AT BEDTIME AS NEEDED FOR SLEEP 30 capsule 5   triamterene-hydrochlorothiazide (MAXZIDE-25) 37.5-25 MG tablet TAKE 1 TABLET BY MOUTH DAILY. (Patient taking differently: Take 1 tablet by mouth in the morning.) 90 tablet 3   venlafaxine XR (EFFEXOR-XR) 150 MG 24 hr capsule Take 1 capsule (150 mg total) by mouth daily along with 75 mg capsule to equal 225 mg total daily. 90 capsule 1   venlafaxine XR (EFFEXOR-XR) 75 MG 24 hr capsule Take 1  capsule (75 mg total) by mouth daily with 150 mg capsule to equal 225 mg total daily. 90 capsule 1   Vitamin D, Ergocalciferol, (DRISDOL) 1.25 MG (50000 UNIT) CAPS capsule TAKE 1 CAPSULE (50,000 UNITS TOTAL) BY MOUTH ONCE A WEEK. 12 capsule 2   No current facility-administered medications for this visit.     Musculoskeletal: Strength & Muscle Tone:  N/A Gait & Station:  N/A Patient leans: N/A  Psychiatric Specialty Exam: Review of Systems  Psychiatric/Behavioral:  Positive for dysphoric mood and sleep disturbance. Negative for agitation, behavioral problems, confusion, decreased concentration, hallucinations, self-injury and suicidal ideas. The patient is nervous/anxious. The patient is not hyperactive.    There were no vitals taken for this visit.There is no height or weight on file to calculate BMI.  General Appearance: Fairly Groomed  Eye Contact:  Good  Speech:  Clear and Coherent  Volume:  Normal  Mood:   better  Affect:  Appropriate, Congruent, and less down  Thought Process:  Coherent  Orientation:  Full (Time, Place, and Person)  Thought Content: Logical   Suicidal Thoughts:  No  Homicidal Thoughts:  No  Memory:  Immediate;   Good  Judgement:  Good  Insight:  Good  Psychomotor Activity:  Normal  Concentration:  Concentration: Good and Attention Span: Good  Recall:  Good  Fund of Knowledge: Good  Language: Good  Akathisia:  No  Handed:  Right  AIMS (if indicated): not done  Assets:  Communication Skills Desire for Improvement  ADL's:  Intact  Cognition: WNL  Sleep:  Fair   Screenings: GAD-7    Flowsheet Row Counselor from 05/15/2018 in [place] Office Visit from 01/08/2017 in [facility]  Total GAD-7 Score 17 14      PHQ2-9    Flowsheet Row Video Visit from 08/10/2021 in [place] Office Visit from 07/18/2021 in [facility] Office Visit from 05/09/2021 in  [facility] Counselor from 04/11/2021 in [place] Office Visit from 12/20/2020 in [facility]  PHQ-2 Total Score 2 2 4 6 1   PHQ-9 Total Score 8 4 10 21  --      Flowsheet Row Video Visit from 08/10/2021 in [place] Video Visit from 05/04/2021 in [place] Counselor from 04/11/2021 in [facility]  C-SSRS RISK CATEGORY No Risk Low Risk No Risk        Assessment and Plan:  Mary Roman is a 51 y.o. year old female with a history of depression, diabetes, who presents for follow up appointment for below.   1. Major depressive disorder, recurrent episode, moderate (H[facility]) Although there has been overall improvement in depressive symptoms since up titration of Abilify, she continues to have increasing in appetite.  Psychosocial stressors includes conflict with her children around the care of her mother, who has cancer.  We will switch from Abilify to rexulti to see if it is mitigates the risk of weight gain.  Discussed potential metabolic side effect and EPS.  Will continue venlafaxine and bupropion to target depression.   2. Insomnia, unspecified type She had a sleep evaluation.  She has an upcoming appointment to get the result.      Plan Continue venlafaxine 225 mg daily (150 mg was filled on 11/14, one refill left, 75 mg was filled on 9/26, one refill left for 90 days) Continue bupropion 450 mg daily Start rexulti 0.5 mg daily  (she is on metformin) Next appointment: 2/16 at 4:30 for 30 mins, video -on  temazepam 30 mg at night as needed for sleep - on gabapentin for pain and hydroxyzine   Past trials of medication: fluoxetine, duloxetine, Abilify (weight gain),  buspirone, Trazodone ("weird" feeling) temazepam, Xanax      The patient demonstrates the following risk factors for suicide: Chronic risk factors for  suicide include: psychiatric disorder of depression, anxiety and chronic pain. Acute risk factors for suicide include: N/A. Protective factors for this patient include: positive social support, responsibility to others (children, family), coping skills and hope for the future. Considering these factors, the overall suicide risk at this point appears to be low. Patient is appropriate for outpatient follow up.    [name], MD 08/10/2021, 4:58 PM

## 2021-08-10 ENCOUNTER — Encounter: Payer: Self-pay | Admitting: Psychiatry

## 2021-08-10 ENCOUNTER — Telehealth (INDEPENDENT_AMBULATORY_CARE_PROVIDER_SITE_OTHER): Payer: No Typology Code available for payment source | Admitting: Psychiatry

## 2021-08-10 ENCOUNTER — Other Ambulatory Visit (HOSPITAL_COMMUNITY): Payer: Self-pay

## 2021-08-10 ENCOUNTER — Other Ambulatory Visit: Payer: Self-pay

## 2021-08-10 DIAGNOSIS — G4700 Insomnia, unspecified: Secondary | ICD-10-CM | POA: Diagnosis not present

## 2021-08-10 DIAGNOSIS — F331 Major depressive disorder, recurrent, moderate: Secondary | ICD-10-CM

## 2021-08-10 MED ORDER — REXULTI 0.5 MG PO TABS
0.5000 mg | ORAL_TABLET | Freq: Every day | ORAL | 1 refills | Status: DC
Start: 2021-08-10 — End: 2021-09-14
  Filled 2021-08-10: qty 30, 30d supply, fill #0

## 2021-08-10 NOTE — Patient Instructions (Signed)
Continue venlafaxine 225 mg daily  Continue bupropion 450 mg daily Start rexulti 0.5 mg daily  Next appointment: 2/16 at 4:30

## 2021-08-11 ENCOUNTER — Ambulatory Visit (HOSPITAL_COMMUNITY)
Admission: RE | Admit: 2021-08-11 | Discharge: 2021-08-11 | Disposition: A | Discharge status: Home / Self Care | Payer: Self-pay | Source: Ambulatory Visit | Attending: Cardiology | Admitting: Cardiology

## 2021-08-11 ENCOUNTER — Other Ambulatory Visit: Payer: Self-pay

## 2021-08-11 DIAGNOSIS — Z136 Encounter for screening for cardiovascular disorders: Secondary | ICD-10-CM | POA: Insufficient documentation

## 2021-08-14 ENCOUNTER — Other Ambulatory Visit (HOSPITAL_COMMUNITY): Payer: Self-pay

## 2021-08-14 ENCOUNTER — Telehealth: Payer: Self-pay | Admitting: Cardiology

## 2021-08-14 NOTE — Telephone Encounter (Signed)
° °  Transferred call to [place] for results

## 2021-08-15 ENCOUNTER — Encounter: Payer: Self-pay | Admitting: Cardiology

## 2021-08-15 ENCOUNTER — Encounter: Payer: No Typology Code available for payment source | Admitting: Cardiology

## 2021-08-15 ENCOUNTER — Other Ambulatory Visit: Payer: Self-pay

## 2021-08-15 VITALS — Ht 65.0 in | Wt 198.0 lb

## 2021-08-15 NOTE — Progress Notes (Signed)
This encounter was created in error - please disregard.

## 2021-08-16 ENCOUNTER — Ambulatory Visit (HOSPITAL_COMMUNITY): Payer: No Typology Code available for payment source | Admitting: Psychiatry

## 2021-08-25 ENCOUNTER — Other Ambulatory Visit (HOSPITAL_COMMUNITY)
Admission: RE | Admit: 2021-08-25 | Discharge: 2021-08-25 | Disposition: A | Discharge status: Home / Self Care | Payer: No Typology Code available for payment source | Source: Ambulatory Visit | Attending: Family Medicine | Admitting: Family Medicine

## 2021-08-25 DIAGNOSIS — I10 Essential (primary) hypertension: Secondary | ICD-10-CM | POA: Insufficient documentation

## 2021-08-25 LAB — LIPID PANEL
Cholesterol: 107 mg/dL (ref 0–200)
HDL: 27 mg/dL — ABNORMAL LOW (ref >40)
LDL Cholesterol: 68 mg/dL (ref 0–99)
Total CHOL/HDL Ratio: 4 RATIO
Triglycerides: 62 mg/dL (ref <150)
VLDL: 12 mg/dL (ref 0–40)

## 2021-08-25 LAB — CBC
HCT: 37.4 % (ref 36.0–46.0)
Hemoglobin: 12.5 g/dL (ref 12.0–15.0)
MCH: 31.3 pg (ref 26.0–34.0)
MCHC: 33.4 g/dL (ref 30.0–36.0)
MCV: 93.7 fL (ref 80.0–100.0)
Platelets: 421 10*3/uL — ABNORMAL HIGH (ref 150–400)
RBC: 3.99 MIL/uL (ref 3.87–5.11)
RDW: 12.5 % (ref 11.5–15.5)
WBC: 9 10*3/uL (ref 4.0–10.5)
nRBC: 0 % (ref 0.0–0.2)

## 2021-08-25 LAB — COMPREHENSIVE METABOLIC PANEL
ALT: 15 U/L (ref 0–44)
AST: 12 U/L — ABNORMAL LOW (ref 15–41)
Albumin: 3.7 g/dL (ref 3.5–5.0)
Alkaline Phosphatase: 72 U/L (ref 38–126)
Anion gap: 8 (ref 5–15)
BUN: 17 mg/dL (ref 6–20)
CO2: 23 mmol/L (ref 22–32)
Calcium: 9 mg/dL (ref 8.9–10.3)
Chloride: 104 mmol/L (ref 98–111)
Creatinine, Ser: 1.07 mg/dL — ABNORMAL HIGH (ref 0.44–1.00)
GFR, Estimated: >60 mL/min (ref >60)
Glucose, Bld: 273 mg/dL — ABNORMAL HIGH (ref 70–99)
Potassium: 3.8 mmol/L (ref 3.5–5.1)
Sodium: 135 mmol/L (ref 135–145)
Total Bilirubin: 0.6 mg/dL (ref 0.3–1.2)
Total Protein: 7.8 g/dL (ref 6.5–8.1)

## 2021-08-25 LAB — HEMOGLOBIN A1C
Hgb A1c MFr Bld: 10 % — ABNORMAL HIGH (ref 4.8–5.6)
Mean Plasma Glucose: 240.3 mg/dL

## 2021-08-25 LAB — TSH: TSH: 1.564 u[IU]/mL (ref 0.350–4.500)

## 2021-08-29 ENCOUNTER — Other Ambulatory Visit: Payer: Self-pay

## 2021-08-29 ENCOUNTER — Encounter: Payer: Self-pay | Admitting: Family Medicine

## 2021-08-29 ENCOUNTER — Other Ambulatory Visit (HOSPITAL_COMMUNITY): Payer: Self-pay

## 2021-08-29 ENCOUNTER — Ambulatory Visit (INDEPENDENT_AMBULATORY_CARE_PROVIDER_SITE_OTHER): Payer: No Typology Code available for payment source | Admitting: Family Medicine

## 2021-08-29 VITALS — BP 124/81 | HR 83 | Resp 16 | Ht 65.0 in | Wt 202.1 lb

## 2021-08-29 DIAGNOSIS — I10 Essential (primary) hypertension: Secondary | ICD-10-CM

## 2021-08-29 DIAGNOSIS — E1159 Type 2 diabetes mellitus with other circulatory complications: Secondary | ICD-10-CM

## 2021-08-29 DIAGNOSIS — E785 Hyperlipidemia, unspecified: Secondary | ICD-10-CM

## 2021-08-29 DIAGNOSIS — E669 Obesity, unspecified: Secondary | ICD-10-CM | POA: Diagnosis not present

## 2021-08-29 DIAGNOSIS — F331 Major depressive disorder, recurrent, moderate: Secondary | ICD-10-CM | POA: Diagnosis not present

## 2021-08-29 MED ORDER — EMPAGLIFLOZIN 10 MG PO TABS
10.0000 mg | ORAL_TABLET | Freq: Every day | ORAL | 0 refills | Status: DC
  Filled 2021-08-29: qty 90, 90d supply, fill #0

## 2021-08-29 NOTE — Patient Instructions (Addendum)
F/U in 6 to 7 weeks with meter and  blood sugar log, call if you ned me sooner  Nurse pls give blood sugar diary  New additional med for diabetes is jardiance 10 mg one daily  Goal for fasting blood sugar ranges from 80 to 120 and 2 hours after any meal or at bedtime should be between 130 to 180.  Please eliminate sugar and snacks  Cholesterol is good  It is important that you exercise regularly at least 30 minutes 5 times a week. If you develop chest pain, have severe difficulty breathing, or feel very tired, stop exercising immediately and seek medical attention

## 2021-08-30 ENCOUNTER — Encounter: Payer: Self-pay | Admitting: Family Medicine

## 2021-08-30 NOTE — Assessment & Plan Note (Signed)
Hyperlipidemia:Low fat diet discussed and encouraged.   Lipid Panel  Lab Results  Component Value Date   CHOL 107 08/25/2021   HDL 27 (L) 08/25/2021   LDLCALC 68 08/25/2021   TRIG 62 08/25/2021   CHOLHDL 4.0 08/25/2021   Needs to exercise

## 2021-08-30 NOTE — Assessment & Plan Note (Signed)
Deteriorated Mary Roman is reminded of the importance of commitment to daily physical activity for 30 minutes or more, as able and the need to limit carbohydrate intake to 30 to 60 grams per meal to help with blood sugar control.   The need to take medication as prescribed, test blood sugar as directed, and to call between visits if there is a concern that blood sugar is uncontrolled is also discussed.   Mary Roman is reminded of the importance of daily foot exam, annual eye examination, and good blood sugar, blood pressure and cholesterol control.  Diabetic Labs Latest Ref Rng & Units 08/25/2021 05/09/2021 01/03/2021 01/02/2021 12/14/2020  HbA1c 4.8 - 5.6 % 10.0(H) 7.3(H) - - 7.1(H)  Microalbumin Not Estab. ug/mL - - - - -  Micro/Creat Ratio 0 - 29 mg/g creat - - - - -  Chol 0 - 200 mg/dL 107 - - - -  HDL >40 mg/dL 27(L) - - - -  Calc LDL 0 - 99 mg/dL 68 - - - -  Triglycerides <150 mg/dL 62 - - - -  Creatinine 0.44 - 1.00 mg/dL 1.07(H) 1.16(H) 1.10(H) 1.27(H) 1.01(H)   BP/Weight 08/29/2021 08/15/2021 07/18/2021 05/26/2021 05/09/2021 01/03/2021 4/65/6812  Systolic BP 751 - 700 174 944 967 591  Diastolic BP 81 - 84 86 88 78 80  Wt. (Lbs) 202.12 198 194.12 194 189.04 185 185  BMI 33.63 32.95 32.3 32.28 31.46 30.79 30.79  Some encounter information is confidential and restricted. Go to Review Flowsheets activity to see all data.   Foot/eye exam completion dates Latest Ref Rng & Units 07/18/2021 09/26/2020  Eye Exam No Retinopathy - No Retinopathy  Foot Form Completion - Done -      Add jardiance,f/u with log in 6 weeks

## 2021-08-30 NOTE — Assessment & Plan Note (Signed)
Managed by psych, recent med change with weight gain, not suicidal or homicidal, in axctive therapy twice monthly

## 2021-08-30 NOTE — Assessment & Plan Note (Signed)
Controlled, no change in medication DASH diet and commitment to daily physical activity for a minimum of 30 minutes discussed and encouraged, as a part of hypertension management. The importance of attaining a healthy weight is also discussed.  BP/Weight 08/29/2021 08/15/2021 07/18/2021 05/26/2021 05/09/2021 01/03/2021 7/65/4650  Systolic BP 354 - 656 812 751 700 174  Diastolic BP 81 - 84 86 88 78 80  Wt. (Lbs) 202.12 198 194.12 194 189.04 185 185  BMI 33.63 32.95 32.3 32.28 31.46 30.79 30.79  Some encounter information is confidential and restricted. Go to Review Flowsheets activity to see all data.

## 2021-08-30 NOTE — Progress Notes (Signed)
Mary Roman     MRN: [id number]      DOB: Dec 23, 1970   HPI Mary Roman is here for follow up and re-evaluation of chronic medical conditions, medication management and review of any available recent lab and radiology data.  Preventive health is updated, specifically  Cancer screening and Immunization.   Psych recently changed med Increased weight gain, poor impulse control, overindulgence in sweets and carbs.Not testing blood sugar which has worsend, exercises 2 days/ week. ROS Denies recent fever or chills. Denies sinus pressure, nasal congestion, ear pain or sore throat. Denies chest congestion, productive cough or wheezing. Denies chest pains, palpitations and leg swelling Denies abdominal pain, nausea, vomiting,diarrhea or constipation.   Denies dysuria, frequency, hesitancy or incontinence. Denies joint pain, swelling and limitation in mobility. Denies headaches, seizures, numbness, or tingling. C/o depression, anxiety  Denies skin break down or rash.   PE  BP 124/81    Pulse 83    Resp 16    Ht 5\' 5"  (1.651 m)    Wt 202 lb 1.9 oz (91.7 kg)    SpO2 98%    BMI 33.63 kg/m   Patient alert and oriented and in no cardiopulmonary distress.  HEENT: No facial asymmetry, EOMI,     Neck supple .  Chest: Clear to auscultation bilaterally.  CVS: S1, S2 no murmurs, no S3.Regular rate.  ABD: Soft non tender.   Ext: No edema  MS: Adequate ROM spine, shoulders, hips and knees.  Skin: Intact, no ulcerations or rash noted.  Psych: Good eye contact, normal affect. Memory intact not anxious or depressed appearing.  CNS: CN 2-12 intact, power,  normal throughout.no focal deficits noted.   Assessment & Plan  Type 2 diabetes mellitus with vascular disease ([place]) Deteriorated Mary Roman is reminded of the importance of commitment to daily physical activity for 30 minutes or more, as able and the need to limit carbohydrate intake to 30 to 60 grams per meal to help with  blood sugar control.   The need to take medication as prescribed, test blood sugar as directed, and to call between visits if there is a concern that blood sugar is uncontrolled is also discussed.   Mary Roman is reminded of the importance of daily foot exam, annual eye examination, and good blood sugar, blood pressure and cholesterol control.  Diabetic Labs Latest Ref Rng & Units 08/25/2021 05/09/2021 01/03/2021 01/02/2021 12/14/2020  HbA1c 4.8 - 5.6 % 10.0(H) 7.3(H) - - 7.1(H)  Microalbumin Not Estab. ug/mL - - - - -  Micro/Creat Ratio 0 - 29 mg/g creat - - - - -  Chol 0 - 200 mg/dL 107 - - - -  HDL >40 mg/dL 27(L) - - - -  Calc LDL 0 - 99 mg/dL 68 - - - -  Triglycerides <150 mg/dL 62 - - - -  Creatinine 0.44 - 1.00 mg/dL 1.07(H) 1.16(H) 1.10(H) 1.27(H) 1.01(H)   BP/Weight 08/29/2021 08/15/2021 07/18/2021 05/26/2021 05/09/2021 01/03/2021 6/57/8469  Systolic BP 629 - 528 413 244 010 272  Diastolic BP 81 - 84 86 88 78 80  Wt. (Lbs) 202.12 198 194.12 194 189.04 185 185  BMI 33.63 32.95 32.3 32.28 31.46 30.79 30.79  Some encounter information is confidential and restricted. Go to Review Flowsheets activity to see all data.   Foot/eye exam completion dates Latest Ref Rng & Units 07/18/2021 09/26/2020  Eye Exam No Retinopathy - No Retinopathy  Foot Form Completion - Done -  Add jardiance,f/u with log in 6 weeks  Obesity (BMI 30.0-34.9)  Patient re-educated about  the importance of commitment to a  minimum of 150 minutes of exercise per week as able.  The importance of healthy food choices with portion control discussed, as well as eating regularly and within a 12 hour window most days. The need to choose "clean , green" food 50 to 75% of the time is discussed, as well as to make water the primary drink and set a goal of 64 ounces water daily.    Weight /BMI 08/29/2021 08/15/2021 07/18/2021  WEIGHT 202 lb 1.9 oz 198 lb 194 lb 1.9 oz  HEIGHT 5\' 5"  5\' 5"  5\' 5"   BMI 33.63 kg/m2 32.95  kg/m2 32.3 kg/m2  Some encounter information is confidential and restricted. Go to Review Flowsheets activity to see all data.      Essential hypertension Controlled, no change in medication DASH diet and commitment to daily physical activity for a minimum of 30 minutes discussed and encouraged, as a part of hypertension management. The importance of attaining a healthy weight is also discussed.  BP/Weight 08/29/2021 08/15/2021 07/18/2021 05/26/2021 05/09/2021 01/03/2021 3/79/4327  Systolic BP 614 - 709 295 747 340 370  Diastolic BP 81 - 84 86 88 78 80  Wt. (Lbs) 202.12 198 194.12 194 189.04 185 185  BMI 33.63 32.95 32.3 32.28 31.46 30.79 30.79  Some encounter information is confidential and restricted. Go to Review Flowsheets activity to see all data.       Major depressive disorder, recurrent episode, moderate ([place]) Managed by psych, recent med change with weight gain, not suicidal or homicidal, in axctive therapy twice monthly  Dyslipidemia (high LDL; low HDL) Hyperlipidemia:Low fat diet discussed and encouraged.   Lipid Panel  Lab Results  Component Value Date   CHOL 107 08/25/2021   HDL 27 (L) 08/25/2021   LDLCALC 68 08/25/2021   TRIG 62 08/25/2021   CHOLHDL 4.0 08/25/2021   Needs to exercise

## 2021-08-30 NOTE — Assessment & Plan Note (Signed)
°  Patient re-educated about  the importance of commitment to a  minimum of 150 minutes of exercise per week as able.  The importance of healthy food choices with portion control discussed, as well as eating regularly and within a 12 hour window most days. The need to choose "clean , green" food 50 to 75% of the time is discussed, as well as to make water the primary drink and set a goal of 64 ounces water daily.    Weight /BMI 08/29/2021 08/15/2021 07/18/2021  WEIGHT 202 lb 1.9 oz 198 lb 194 lb 1.9 oz  HEIGHT 5\' 5"  5\' 5"  5\' 5"   BMI 33.63 kg/m2 32.95 kg/m2 32.3 kg/m2  Some encounter information is confidential and restricted. Go to Review Flowsheets activity to see all data.

## 2021-08-31 ENCOUNTER — Other Ambulatory Visit: Payer: Self-pay

## 2021-08-31 ENCOUNTER — Ambulatory Visit (INDEPENDENT_AMBULATORY_CARE_PROVIDER_SITE_OTHER): Payer: No Typology Code available for payment source | Admitting: Psychiatry

## 2021-08-31 ENCOUNTER — Encounter (HOSPITAL_COMMUNITY): Payer: Self-pay

## 2021-08-31 DIAGNOSIS — F331 Major depressive disorder, recurrent, moderate: Secondary | ICD-10-CM

## 2021-08-31 NOTE — Progress Notes (Signed)
Virtual Visit via Video Note  I connected with Mary Roman on 08/31/21 at  3:00 PM EST by a video enabled telemedicine application and verified that I am speaking with the correct person using two identifiers.  Location: Patient: Home Provider: [place] office    I discussed the limitations of evaluation and management by telemedicine and the availability of in person appointments. The patient expressed understanding and agreed to proceed.  n fails to improve as anticipated.  I provided 50 minutes of non-face-to-face time during this encounter.   [name], LCSW     THERAPIST PROGRESS NOTE  Session Time: Thursday 08/31/2021   3:00 PM - 3:50 PM   Participation Level: Active  Behavioral Response: CasualAlert/Depressed/Anxious/  Type of Therapy: Individual Therapy  Treatment Goals addressed:  Reduce frequency, intensity, and duration of depression symptoms as evidenced by patient having interaction or doing recreational activities with her children 2 times per week.   Interventions: CBT and Supportive  Summary: Mary Roman is a 51 y.o. female who initially was referred for services by psychiatrist Dr. Modesta Messing.  Patient reports experiencing symptoms of depression intermittently for the past 7 to 8 years.  She reports participating in therapy twice with [facility] counselor in the past.  She reports no psychiatric hospitalizations.  She began taking psychotropic medications many years ago as prescribed by PCP.  She is a returning patient to this clinician and last was seen in 2020.  She is resuming services per recommendation from Dr. Modesta Messing.  Patient states they have not been able to find the right medication cocktail.  She reports still feeling depressed, anxious, and hopeless all the time.  She states barely being able to make it to work.  She reports stress related to family dynamics including issues with her son and daughter.  Her son is suffering from  depression and her daughter internalizes her feelings.  She also reports stress regarding caretaker issues regarding her mother.  Patient last was seen via virtual visit about 3-4 weeks ago. She reports her symptoms of depression have worsened since last session.  These include depressed mood, poor concentration, memory difficulty, poor motivation, decreased interest/pleasure in activities, and social withdrawal.  She reports continued stress regarding caretaker responsibilities for her mother.  She reports mother recently had COVID and patient had to take her to the ED.  She also reports constant worry about such issues issues as finances and her children.  She verbalizes "what if" thoughts about the future and unknown.  She reports she cooks dinner upon arriving home rather than going straight to her bedroom as she has in the past.  However, she reports little to no involvement in any other activity.  She reports she has not done any activities with her children.    Suicidal/Homicidal: Nowithout intent/plan,   Therapist Response: Reviewed symptoms, praised and reinforced patient's efforts to try to cook dinner, discussed stressors, facilitated expression of thoughts and feelings, validated feelings, assisted patient examine her thought patterns, used worry exploration questions to assist patient identify/challenge/and replace unhelpful thoughts with more helpful thoughts, developed plan with patient to use handout between sessions to address worries, reviewed rationale for practicing deep breathing to trigger relaxation response to cope with anxiety and stress, developed plan with patient to practice deep breathing 5 to 10 minutes twice per day, assisted patient identifying value congruent behavior to increase behavioral activation, developed plan with patient to go thrift shopping with daughter this weekend, assisted patient identify and  address thoughts and processes that may inhibit implementation of plan    Plan: Return again in 1- 2 weeks.   Diagnosis: Axis I: MDD    Axis II: Deferred          [name], LCSW 08/31/2021

## 2021-09-11 ENCOUNTER — Other Ambulatory Visit (HOSPITAL_COMMUNITY): Payer: Self-pay | Admitting: Family Medicine

## 2021-09-11 ENCOUNTER — Ambulatory Visit (HOSPITAL_COMMUNITY)
Admission: RE | Admit: 2021-09-11 | Discharge: 2021-09-11 | Disposition: A | Discharge status: Home / Self Care | Payer: No Typology Code available for payment source | Source: Ambulatory Visit | Attending: Family Medicine | Admitting: Family Medicine

## 2021-09-11 ENCOUNTER — Other Ambulatory Visit: Payer: Self-pay

## 2021-09-11 DIAGNOSIS — Z1231 Encounter for screening mammogram for malignant neoplasm of breast: Secondary | ICD-10-CM | POA: Diagnosis present

## 2021-09-11 NOTE — Progress Notes (Signed)
Virtual Visit via Video Note  I connected with Mary Roman on 09/14/21 at  4:30 PM EST by a video enabled telemedicine application and verified that I am speaking with the correct person using two identifiers.  Location: Patient: home Provider: office Persons participated in the visit- patient, provider    I discussed the limitations of evaluation and management by telemedicine and the availability of in person appointments. The patient expressed understanding and agreed to proceed.   I discussed the assessment and treatment plan with the patient. The patient was provided an opportunity to ask questions and all were answered. The patient agreed with the plan and demonstrated an understanding of the instructions.   The patient was advised to call back or seek an in-person evaluation if the symptoms worsen or if the condition fails to improve as anticipated.  I provided 15 minutes of non-face-to-face time during this encounter.   [name], MD    [facility] MD/PA/NP OP Progress Note  09/14/2021 5:11 PM Mary Roman  MRN:  [id number]  Chief Complaint:  Chief Complaint   Follow-up; Depression    HPI:  This is a follow-up appointment for depression.  She states that she has not noticed much difference since switching from Abilify to rexulti.  It has been rough, and she feels "not okay,' although she cannot tell why.  She tends to stay in the bed most of the time when she does not work.  There has been not much activity.  It has been difficult for her to force herself to do things.  She had to took her mother to the ER as she had a COVID.  Her mother is doing well at home.  She also hired a Chief Strategy Officer.  She states that her boyfriend will have knee replacement; he will have 2 months off from work, and will have another surgery on his knee.  She feels concerned due to potential financial concern.  However, she also feels excited that her son will start working at [employer] at  [employer].  She has depressive symptoms as in PHQ-9.  Her appetite fluctuates from decreased to increased.  Although she reports passive SI at times, she denies any plan or intent.  She agrees to contact emergency resources if any worsening.  She denies alcohol use or drug use.  She has not noticed any side effect from rexulti, and is willing to uptitrate the dose at this time.    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  one son, one daughter  198 lbs Wt Readings from Last 3 Encounters:  08/29/21 202 lb 1.9 oz (91.7 kg)  08/15/21 198 lb (89.8 kg)  07/18/21 194 lb 1.9 oz (88.1 kg)     Visit Diagnosis:    ICD-10-CM   1. MDD (major depressive disorder), recurrent episode, mild ([place])  F33.0     2. Insomnia, unspecified type  G47.00       Past Psychiatric History: Please see initial evaluation for full details. I have reviewed the history. No updates at this time.     Past Medical History:  Past Medical History:  Diagnosis Date   Absolute anemia 06/23/2018   Added automatically from request for surgery [id number]   Anemia    Anxiety    Backache 12/24/2007   Chronic back pain    Depression    Diabetes mellitus, type II ([place])    Headache(784.0)  Hypertension    Metabolic syndrome X 64/40/3474   Obesity    Palpitation    Sickle cell trait ([place])    Thyromegaly    Unspecified vitamin D deficiency 02/19/2013    Past Surgical History:  Procedure Laterality Date   COLONOSCOPY WITH PROPOFOL N/A 01/03/2021   Procedure: COLONOSCOPY WITH PROPOFOL;  Surgeon: Harvel Quale, MD;  Location: [facility];  Service: Gastroenterology;  Laterality: N/A;  AM   DILATATION AND CURETTAGE/HYSTEROSCOPY WITH MINERVA N/A 03/25/2019   Procedure: DILATATION AND CURETTAGE/HYSTEROSCOPY WITH  ATTEMPTED MINERVA ENDOMETRIAL ABLATION;  Surgeon: Florian Buff, MD;  Location: [facility]  ORS;  Service: Gynecology;  Laterality: N/A;   None      Family Psychiatric History: Please see initial evaluation for full details. I have reviewed the history. No updates at this time.     Family History:  Family History  Problem Relation Age of Onset   Diabetes Mother    Hypertension Mother    Stroke Mother    Colon cancer Mother    Alcohol abuse Mother    Cancer - Colon Mother 54   Pneumonia Father    Depression Sister    Hypertension Sister    Anxiety disorder Sister    Hypertension Sister    Leukemia Sister    Heart attack Maternal Uncle    Hypertension Maternal Grandfather    Diabetes Maternal Grandmother    Hypertension Maternal Grandmother    Depression Child     Social History:  Social History   Socioeconomic History   Marital status: Single    Spouse name: Not on file   Number of children: 2   Years of education: Not on file   Highest education level: Not on file  Occupational History   Occupation: employed in medical office   Tobacco Use   Smoking status: Never   Smokeless tobacco: Never  Vaping Use   Vaping Use: Never used  Substance and Sexual Activity   Alcohol use: No   Drug use: No   Sexual activity: Yes    Partners: Male    Birth control/protection: Other-see comments    Comment: bf had vasectomy  Other Topics Concern   Not on file  Social History Narrative   Not on file   Social Determinants of Health   Financial Resource Strain: Not on file  Food Insecurity: Not on file  Transportation Needs: Not on file  Physical Activity: Not on file  Stress: Not on file  Social Connections: Not on file    Allergies: No Known Allergies  Metabolic Disorder Labs: Lab Results  Component Value Date   HGBA1C 10.0 (H) 08/25/2021   MPG 240.3 08/25/2021   MPG 200.12 04/18/2020   No results found for: PROLACTIN Lab Results  Component Value Date   CHOL 107 08/25/2021   TRIG 62 08/25/2021   HDL 27 (L) 08/25/2021   CHOLHDL 4.0 08/25/2021    VLDL 12 08/25/2021   LDLCALC 68 08/25/2021   [place] 80 09/01/2020   Lab Results  Component Value Date   TSH 1.564 08/25/2021   TSH 1.400 09/01/2020    Therapeutic Level Labs: No results found for: LITHIUM No results found for: VALPROATE No components found for:  CBMZ  Current Medications: Current Outpatient Medications  Medication Sig Dispense Refill   brexpiprazole (REXULTI) 1 MG TABS tablet Take 1 tablet (1 mg total) by mouth daily. 30 tablet 1   acetaminophen (TYLENOL) 500 MG tablet Take 1,000 mg by mouth every 6 (  six) hours as needed (pain).     aspirin EC 81 MG tablet Take 1 tablet (81 mg total) by mouth daily. (Patient taking differently: Take 81 mg by mouth in the morning.) 90 tablet 3   benazepril (LOTENSIN) 5 MG tablet Take 1 tablet (5 mg total) by mouth daily. 90 tablet 3   blood glucose meter kit and supplies Dispense based on patient and insurance preference. Once daily testing dx e11.9 1 each 0   buPROPion (WELLBUTRIN XL) 150 MG 24 hr tablet Take 3 tablets (450 mg total) by mouth daily. 270 tablet 1   empagliflozin (JARDIANCE) 10 MG TABS tablet Take 1 tablet (10 mg total) by mouth daily before breakfast. 90 tablet 0   ergocalciferol (VITAMIN D2) 1.25 MG (50000 UT) capsule Take 1 capsule (50,000 Units total) by mouth once a week. 12 capsule 1   glipiZIDE (GLUCOTROL XL) 10 MG 24 hr tablet Take 2 tablets (20 mg total) by mouth daily with breakfast. 180 tablet 1   megestrol (MEGACE) 40 MG tablet Take 1 tablet (40 mg total) by mouth in the morning. 30 tablet 11   metoprolol tartrate (LOPRESSOR) 25 MG tablet Take 1 tablet (25 mg total) by mouth 2 (two) times daily. 180 tablet 3   rosuvastatin (CRESTOR) 5 MG tablet Take 1 tablet (5 mg total) by mouth daily. 90 tablet 3   SitaGLIPtin-MetFORMIN HCl (JANUMET XR) [phone] MG TB24 Take 1 tablet by mouth daily. 90 tablet 0   temazepam (RESTORIL) 30 MG capsule TAKE 1 CAPSULE BY MOUTH AT BEDTIME AS NEEDED FOR SLEEP 30 capsule 5    triamterene-hydrochlorothiazide (MAXZIDE-25) 37.5-25 MG tablet TAKE 1 TABLET BY MOUTH DAILY. (Patient taking differently: Take 1 tablet by mouth in the morning.) 90 tablet 3   venlafaxine XR (EFFEXOR-XR) 150 MG 24 hr capsule Take 1 capsule (150 mg total) by mouth daily along with 75 mg capsule to equal 225 mg total daily. 90 capsule 1   venlafaxine XR (EFFEXOR-XR) 75 MG 24 hr capsule Take 1 capsule (75 mg total) by mouth daily with 150 mg capsule to equal 225 mg total daily. 90 capsule 1   Vitamin D, Ergocalciferol, (DRISDOL) 1.25 MG (50000 UNIT) CAPS capsule TAKE 1 CAPSULE (50,000 UNITS TOTAL) BY MOUTH ONCE A WEEK. 12 capsule 2   No current facility-administered medications for this visit.     Musculoskeletal: Strength & Muscle Tone:  N/A Gait & Station:  N/A Patient leans: N/A  Psychiatric Specialty Exam: Review of Systems  Psychiatric/Behavioral:  Positive for decreased concentration, dysphoric mood, sleep disturbance and suicidal ideas. Negative for agitation, behavioral problems, confusion, hallucinations and self-injury. The patient is nervous/anxious. The patient is not hyperactive.   All other systems reviewed and are negative.  There were no vitals taken for this visit.There is no height or weight on file to calculate BMI.  General Appearance: Fairly Groomed  Eye Contact:  Good  Speech:  Clear and Coherent  Volume:  Normal  Mood:   "same"  Affect:  Appropriate, Congruent, and down  Thought Process:  Coherent  Orientation:  Full (Time, Place, and Person)  Thought Content: Logical   Suicidal Thoughts:  Yes.  without intent/plan  Homicidal Thoughts:  No  Memory:  Immediate;   Good  Judgement:  Good  Insight:  Good  Psychomotor Activity:  Normal  Concentration:  Concentration: Good and Attention Span: Good  Recall:  Good  Fund of Knowledge: Good  Language: Good  Akathisia:  No  Handed:  Right  AIMS (if indicated): not done  Assets:  Communication Skills Desire for  Improvement  ADL's:  Intact  Cognition: WNL  Sleep:  Poor   Screenings: GAD-7    Flowsheet Row Counselor from 05/15/2018 in [place] Office Visit from 01/08/2017 in [facility]  Total GAD-7 Score 17 14      PHQ2-9    Flowsheet Row Video Visit from 09/14/2021 in [place] Video Visit from 08/10/2021 in [place] Office Visit from 07/18/2021 in [facility] Office Visit from 05/09/2021 in [facility] Counselor from 04/11/2021 in [facility]  PHQ-2 Total Score 2 2 2 4 6   PHQ-9 Total Score 9 8 4 10 21       Flowsheet Row Video Visit from 09/14/2021 in [place] Video Visit from 08/10/2021 in [place] Video Visit from 05/04/2021 in [place] Error: Q3, 4, or 5 should not be populated when Q2 is No No Risk Low Risk        Assessment and Plan:  AIDYNN POLENDO is a 51 y.o. year old female with a history of depression, diabetes, who presents for follow up appointment for below.   1. MDD (major depressive disorder), recurrent episode, mild ([place]) She continues to report depressive symptoms and anxiety since the last visit.  Psychosocial stressors includes upcoming surgery for her boyfriend, conflict with her children around the care of her mother, who has cancer.  Will uptitrate rexulti adjunctive treatment for depression.  Noted that this medication was switched from Abilify to see if it mitigates the risk of weight gain.  Discussed potential metabolic side effect and EPS.  Will continue venlafaxine and bupropion to target depression.   2. Insomnia, unspecified type She had sleep evaluation, and we have an upcoming appointment to get the result.     Plan Continue venlafaxine 225 mg daily (150  mg was filled on 11/14, one refill left, 75 mg was filled on 9/26, one refill left for 90 days) Continue bupropion 450 mg daily Increase rexulti 1 mg daily  (she is on metformin) Next appointment:  3/20 at 4:30, video (she declined in person visit due to work schedule) -on  temazepam 30 mg at night as needed for sleep - on gabapentin for pain and hydroxyzine   Past trials of medication: fluoxetine, duloxetine, Abilify (weight gain),  buspirone, Trazodone ("weird" feeling) temazepam, Xanax      The patient demonstrates the following risk factors for suicide: Chronic risk factors for suicide include: psychiatric disorder of depression, anxiety and chronic pain. Acute risk factors for suicide include: N/A. Protective factors for this patient include: positive social support, responsibility to others (children, family), coping skills and hope for the future. Considering these factors, the overall suicide risk at this point appears to be low. Patient is appropriate for outpatient follow up.  Collaboration of Care: Other was referred to therapy  Consent: Patient/Guardian gives verbal consent for treatment and assignment of benefits for services provided during this visit. Patient/Guardian expressed understanding and agreed to proceed.    Discussed with the patient that upcoming regulatory changes in Telehealth after May 11th. The patient verbalized understanding that she may need to come for in person visit in the future depending on their regulation.     [name], MD 09/14/2021, 5:11 PM

## 2021-09-14 ENCOUNTER — Telehealth (INDEPENDENT_AMBULATORY_CARE_PROVIDER_SITE_OTHER): Payer: No Typology Code available for payment source | Admitting: Psychiatry

## 2021-09-14 ENCOUNTER — Encounter: Payer: Self-pay | Admitting: Psychiatry

## 2021-09-14 ENCOUNTER — Other Ambulatory Visit (HOSPITAL_COMMUNITY): Payer: Self-pay

## 2021-09-14 ENCOUNTER — Other Ambulatory Visit: Payer: Self-pay

## 2021-09-14 DIAGNOSIS — F330 Major depressive disorder, recurrent, mild: Secondary | ICD-10-CM

## 2021-09-14 DIAGNOSIS — G4700 Insomnia, unspecified: Secondary | ICD-10-CM | POA: Diagnosis not present

## 2021-09-14 MED ORDER — BREXPIPRAZOLE 1 MG PO TABS
1.0000 mg | ORAL_TABLET | Freq: Every day | ORAL | 1 refills | Status: DC
Start: 2021-09-14 — End: 2022-04-16
  Filled 2021-09-14: qty 30, 30d supply, fill #0
  Filled 2021-11-06: qty 30, 30d supply, fill #1

## 2021-09-18 ENCOUNTER — Other Ambulatory Visit (HOSPITAL_COMMUNITY): Payer: Self-pay

## 2021-09-18 ENCOUNTER — Encounter: Payer: Self-pay | Admitting: Family Medicine

## 2021-09-18 ENCOUNTER — Other Ambulatory Visit: Payer: Self-pay | Admitting: Family Medicine

## 2021-09-18 MED ORDER — FLUCONAZOLE 150 MG PO TABS
150.0000 mg | ORAL_TABLET | ORAL | 0 refills | Status: DC
Start: 2021-09-18 — End: 2021-11-28
  Filled 2021-09-18: qty 2, 2d supply, fill #0

## 2021-09-21 ENCOUNTER — Other Ambulatory Visit: Payer: Self-pay

## 2021-09-21 ENCOUNTER — Ambulatory Visit (INDEPENDENT_AMBULATORY_CARE_PROVIDER_SITE_OTHER): Payer: No Typology Code available for payment source | Admitting: Psychiatry

## 2021-09-21 DIAGNOSIS — F330 Major depressive disorder, recurrent, mild: Secondary | ICD-10-CM | POA: Diagnosis not present

## 2021-09-21 NOTE — Progress Notes (Signed)
Virtual Visit via Video Note  I connected with Mary Roman on 09/21/21 at 3:10 PM EST  by a video enabled telemedicine application and verified that I am speaking with the correct person using two identifiers.  Location: Patient: Home Provider: [place] office    I discussed the limitations of evaluation and management by telemedicine and the availability of in person appointments. The patient expressed understanding and agreed to proceed.   I provided 20 minutes of non-face-to-face time during this encounter.   [name], LCSW     THERAPIST PROGRESS NOTE  Session Time: Thursday 09/21/2021   3:10 PM  - 3:30 PM   Participation Level: Active  Behavioral Response: CasualAlert/Depressed/Anxious/  Type of Therapy: Individual Therapy  Treatment Goals addressed:  Reduce frequency, intensity, and duration of depression symptoms as evidenced by patient having interaction or doing recreational activities with her children 2 times per week.   Interventions: CBT and Supportive  Summary: Mary Roman is a 51 y.o. female who initially was referred for services by psychiatrist Dr. Modesta Messing.  Patient reports experiencing symptoms of depression intermittently for the past 7 to 8 years.  She reports participating in therapy twice with [facility] counselor in the past.  She reports no psychiatric hospitalizations.  She began taking psychotropic medications many years ago as prescribed by PCP.  She is a returning patient to this clinician and last was seen in 2020.  She is resuming services per recommendation from Dr. Modesta Messing.  Patient states they have not been able to find the right medication cocktail.  She reports still feeling depressed, anxious, and hopeless all the time.  She states barely being able to make it to work.  She reports stress related to family dynamics including issues with her son and daughter.  Her son is suffering from depression and her daughter  internalizes her feelings.  She also reports stress regarding caretaker issues regarding her mother.  Patient last was seen via virtual visit about 2-3 weeks ago. She reports worsening symptoms of depression as reflected in the PHQ 2 and 9.  She has talked with psychiatrist Dr. Modesta Messing and is taking increased dosage of medication as prescribed by Dr. Modesta Messing.  Patient identifies possible trigger of increased symptoms as worry about boyfriend having knee replacement surgery in April and then having to have surgery on the other knee 3 months later.  Patient worries about impact on their finances.  She reports having to push self to go to work, she reports reverting to all patterns of going straight to her bedroom when she gets off work.  She reports she did not implement plan to go thrift shopping with daughter as she was too depressed.  She reports she is going to try to go out to dinner with daughter tomorrow.   Patient reports some relief about caretaker responsibilities for her mother as mother now has a new aide who is transporting mother to medical appointments.  Patient is thankful she is no longer having to leave work early to do this.  Patient and therapist agreed to end session early as patient is experiencing GI issues and has a headache.    Suicidal/Homicidal: Nowithout intent/plan,   Therapist Response: Reviewed symptoms, administered PHQ 2 and 9, assisted patient identify triggers of increased depressed mood, discussed stressors, facilitated expression of thoughts and feelings, validated feelings, reviewed the role of behavioral activation, encouraged patient to follow through with plan to do an activity with her daughter Plan: Return again  in 1- 2 weeks.   Diagnosis: Axis I: MDD    Axis II: Deferred          [name], LCSW 09/21/2021

## 2021-09-26 ENCOUNTER — Other Ambulatory Visit: Payer: Self-pay | Admitting: Family Medicine

## 2021-09-26 ENCOUNTER — Other Ambulatory Visit (HOSPITAL_COMMUNITY): Payer: Self-pay

## 2021-09-26 MED ORDER — JANUMET XR 100-1000 MG PO TB24
1.0000 | ORAL_TABLET | Freq: Every day | ORAL | 0 refills | Status: DC
  Filled 2021-09-26: qty 30, 30d supply, fill #0

## 2021-09-28 ENCOUNTER — Other Ambulatory Visit (HOSPITAL_COMMUNITY): Payer: Self-pay

## 2021-09-28 ENCOUNTER — Other Ambulatory Visit: Payer: Self-pay

## 2021-09-28 MED ORDER — JANUMET XR 100-1000 MG PO TB24
1.0000 | ORAL_TABLET | Freq: Every day | ORAL | 0 refills | Status: DC
Start: 2021-09-28 — End: 2021-11-28
  Filled 2021-09-29: qty 30, 30d supply, fill #0
  Filled 2021-11-28: qty 30, 30d supply, fill #1

## 2021-09-29 ENCOUNTER — Other Ambulatory Visit (HOSPITAL_COMMUNITY): Payer: Self-pay

## 2021-10-05 ENCOUNTER — Ambulatory Visit (INDEPENDENT_AMBULATORY_CARE_PROVIDER_SITE_OTHER): Payer: No Typology Code available for payment source | Admitting: Psychiatry

## 2021-10-05 ENCOUNTER — Other Ambulatory Visit: Payer: Self-pay

## 2021-10-05 DIAGNOSIS — F330 Major depressive disorder, recurrent, mild: Secondary | ICD-10-CM | POA: Diagnosis not present

## 2021-10-05 NOTE — Progress Notes (Signed)
Virtual Visit via Video Note ? ?I connected with Kennidy Lamke Villwock on 10/05/21 at 4:10 PM EST by a video enabled telemedicine application and verified that I am speaking with the correct person using two identifiers. ? ?Location: ?Patient: Home ?Provider: [facility] [place] office  ?  ?I discussed the limitations of evaluation and management by telemedicine and the availability of in person appointments. The patient expressed understanding and agreed to proceed. ? ? ?I provided 45 minutes of non-face-to-face time during this encounter. ? ? ?Khush Pasion E Jackilyn Umphlett, LCSW ? ? ? ?THERAPIST PROGRESS NOTE ? ?Session Time: Thursday 10/05/2021 4:10 PM - 4:55 PM  ? ?Participation Level: Active ? ?Behavioral Response: CasualAlert/Depressed/Anxious/ ? ?Type of Therapy: Individual Therapy ? ?Treatment Goals addressed:  Reduce frequency, intensity, and duration of depression symptoms as evidenced by patient having interaction or doing recreational activities with her children 2 times per week. ? ?Progress on goals: Progressing ? ?Interventions: CBT and Supportive ? ?Summary: AZARIAH LATENDRESSE is a 51 y.o. female who initially was referred for services by psychiatrist Dr. Modesta Messing.  Patient reports experiencing symptoms of depression intermittently for the past 7 to 8 years.  She reports participating in therapy twice with [facility] counselor in the past.  She reports no psychiatric hospitalizations.  She began taking psychotropic medications many years ago as prescribed by PCP.  She is a returning patient to this clinician and last was seen in 2020.  She is resuming services per recommendation from Dr. Modesta Messing.  Patient states they have not been able to find the right medication cocktail.  She reports still feeling depressed, anxious, and hopeless all the time.  She states barely being able to make it to work.  She reports stress related to family dynamics including issues with her son and daughter.  Her son is suffering from depression  and her daughter internalizes her feelings.  She also reports stress regarding caretaker issues regarding her mother. ? ?Patient last was seen via virtual visit about 2-3 weeks ago. She reports no change in symptoms since last session.  She reports she has gone out with daughter and her friend in once since last session.  Patient reports having to push herself to do this but was glad that she did it.  Patient reports still feeling down but initially has difficulty identifying triggers.  She expresses frustration as she reports fianc? is not understanding regarding her experience with depression.  She expresses desire to do more activities as a couple rather than just doing things with their children.  She reports not doing things she thinks she should do regarding her household responsibilities.  She reports feelings of shame .   ? ? ?Suicidal/Homicidal: Nowithout intent/plan,  ? ?Therapist Response: Reviewed symptoms, praised and reinforced patient's efforts to do activity with her daughter, discussed effects, discussed stressors, facilitated expression of thoughts and feelings, validated feelings, assisted patient identify realistic expectations of self, assisted patient identify expectations and values regarding relationship with fianc?, assisted patient identify value congruent behaviors to pursue, developed plan with patient to use assertiveness skills to plan a date night with fianc?, assisted patient identify/address thoughts and processes that may inhibit implementation of plan, develop plan with patient to begin keeping a daily gratitude journal, also encouraged patient to implement her plan to resume exercise by walking a few days per week. ? ? ?Plan: Return again in 1- 2 weeks. ?  ?Diagnosis: Axis I: MDD ? ?  Axis II: Deferred       ? ?  Collaboration of Care: Other none needed at this session ? ?Patient/Guardian was advised Release of Information must be obtained prior to any record release in order to  collaborate their care with an outside provider. Patient/Guardian was advised if they have not already done so to contact the registration department to sign all necessary forms in order for [place] to release information regarding their care.  ? ?Consent: Patient/Guardian gives verbal consent for treatment and assignment of benefits for services provided during this visit. Patient/Guardian expressed understanding and agreed to proceed.  ? ?[name], LCSW ?10/05/2021 ? ? ? ?

## 2021-10-16 ENCOUNTER — Telehealth: Payer: No Typology Code available for payment source | Admitting: Psychiatry

## 2021-10-17 NOTE — Progress Notes (Signed)
Virtual Visit via Video Note ? ?I connected with Mary Roman on 10/19/21 at  4:30 PM EDT by a video enabled telemedicine application and verified that I am speaking with the correct person using two identifiers. ? ?Location: ?Patient: home ?Provider: office ?Persons participated in the visit- patient, provider  ?  ?I discussed the limitations of evaluation and management by telemedicine and the availability of in person appointments. The patient expressed understanding and agreed to proceed. ? ?  ?I discussed the assessment and treatment plan with the patient. The patient was provided an opportunity to ask questions and all were answered. The patient agreed with the plan and demonstrated an understanding of the instructions. ?  ?The patient was advised to call back or seek an in-person evaluation if the symptoms worsen or if the condition fails to improve as anticipated. ? ?I provided 15 minutes of non-face-to-face time during this encounter. ? ? ?Mary Clay, MD ? ? ? ?BH MD/PA/NP OP Progress Note ? ?10/19/2021 5:09 PM ?[place]  ?MRN:  [id number] ? ?Chief Complaint:  ?Chief Complaint  ?Patient presents with  ? Follow-up  ? Depression  ? ?HPI:  ?This is a follow-up appointment for depression and anxiety.  ?She states that she has been feeling more anxious since the last visit.  She has many thoughts in her mind rather than feeling inner restlessness.  Although she has been able to do her work, she feels she has been slower and  robotic.  She also feels she has more challenges in coping when she is stressed.  She tends to stay in the bed when she does not work.  She also receives comment from her children, who has noticed her moods change.  She feels down about this as well.  She continues to take care of her mother; she may visit every other weekend.  Her mother is now have a Environmental health practitioner, and she is trying to see how it goes.  She sleeps 5 hours and feels tired.  She denies change in weight or  appetite.  Although she reports fleeting passive SI, she denies any plan or intent.  She has not been driving lately as she is scared that she might do something.  She feels comfortable at home.  She agrees to contact emergency resources if needed.  She denies gun access at home.  She tends to be irritable.  She denies alcohol use or drug use.  While reviewing her medication, she agrees that she may not be taking medication on weekends, although she is sure she takes those during the day.  She agrees to first try taking medication consistently/every day before making further changes.  ? ? ?Wt Readings from Last 3 Encounters:  ?08/29/21 202 lb 1.9 oz (91.7 kg)  ?08/15/21 198 lb (89.8 kg)  ?07/18/21 194 lb 1.9 oz (88.1 kg)  ?  ? ? 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  one son, one daughter ? ? ?Visit Diagnosis:  ?  ICD-10-CM   ?1. MDD (major depressive disorder), recurrent episode, mild ([place])  F33.0   ?  ?2. Insomnia, unspecified type  G47.00   ?  ?3. GAD (generalized anxiety disorder)  F41.1   ?  ? ? ?Past Psychiatric History: Please see initial evaluation for full details. I have reviewed the history. No updates at this time.  ?  ? ?Past Medical History:  ?Past Medical History:  ?  Diagnosis Date  ? Absolute anemia 06/23/2018  ? Added automatically from request for surgery [id number]  ? Anemia   ? Anxiety   ? Backache 12/24/2007  ? Chronic back pain   ? Depression   ? Diabetes mellitus, type II ([place])   ? Headache(784.0)   ? Hypertension   ? Metabolic syndrome X 78/46/9629  ? Obesity   ? Palpitation   ? Sickle cell trait ([place])   ? Thyromegaly   ? Unspecified vitamin D deficiency 02/19/2013  ?  ?Past Surgical History:  ?Procedure Laterality Date  ? COLONOSCOPY WITH PROPOFOL N/A 01/03/2021  ? Procedure: COLONOSCOPY WITH PROPOFOL;  Surgeon: Harvel Quale, MD;  Location: [facility];   Service: Gastroenterology;  Laterality: N/A;  AM  ? DILATATION AND CURETTAGE/HYSTEROSCOPY WITH MINERVA N/A 03/25/2019  ? Procedure: DILATATION AND CURETTAGE/HYSTEROSCOPY WITH  ATTEMPTED MINERVA ENDOMETRIAL ABLATION;  Surgeon: Florian Buff, MD;  Location: [facility] ORS;  Service: Gynecology;  Laterality: N/A;  ? None    ? ? ?Family Psychiatric History: Please see initial evaluation for full details. I have reviewed the history. No updates at this time.  ?  ? ?Family History:  ?Family History  ?Problem Relation Age of Onset  ? Diabetes Mother   ? Hypertension Mother   ? Stroke Mother   ? Colon cancer Mother   ? Alcohol abuse Mother   ? Cancer - Colon Mother 6  ? Pneumonia Father   ? Depression Sister   ? Hypertension Sister   ? Anxiety disorder Sister   ? Hypertension Sister   ? Leukemia Sister   ? Heart attack Maternal Uncle   ? Hypertension Maternal Grandfather   ? Diabetes Maternal Grandmother   ? Hypertension Maternal Grandmother   ? Depression Child   ? ? ?Social History:  ?Social History  ? ?Socioeconomic History  ? Marital status: Single  ?  Spouse name: Not on file  ? Number of children: 2  ? Years of education: Not on file  ? Highest education level: Not on file  ?Occupational History  ? Occupation: employed in medical office   ?Tobacco Use  ? Smoking status: Never  ? Smokeless tobacco: Never  ?Vaping Use  ? Vaping Use: Never used  ?Substance and Sexual Activity  ? Alcohol use: No  ? Drug use: No  ? Sexual activity: Yes  ?  Partners: Male  ?  Birth control/protection: Other-see comments  ?  Comment: bf had vasectomy  ?Other Topics Concern  ? Not on file  ?Social History Narrative  ? Not on file  ? ?Social Determinants of Health  ? ?Financial Resource Strain: Not on file  ?Food Insecurity: Not on file  ?Transportation Needs: Not on file  ?Physical Activity: Not on file  ?Stress: Not on file  ?Social Connections: Not on file  ? ? ?Allergies: No Known Allergies ? ?Metabolic Disorder Labs: ?Lab Results  ?Component  Value Date  ? HGBA1C 10.0 (H) 08/25/2021  ? MPG 240.3 08/25/2021  ? MPG 200.12 04/18/2020  ? ?No results found for: PROLACTIN ?Lab Results  ?Component Value Date  ? CHOL 107 08/25/2021  ? TRIG 62 08/25/2021  ? HDL 27 (L) 08/25/2021  ? CHOLHDL 4.0 08/25/2021  ? VLDL 12 08/25/2021  ? [place] 68 08/25/2021  ? [place] 80 09/01/2020  ? ?Lab Results  ?Component Value Date  ? TSH 1.564 08/25/2021  ? TSH 1.400 09/01/2020  ? ? ?Therapeutic Level Labs: ?No results found for: LITHIUM ?No results found for:  VALPROATE ?No components found for:  CBMZ ? ?Current Medications: ?Current Outpatient Medications  ?Medication Sig Dispense Refill  ? acetaminophen (TYLENOL) 500 MG tablet Take 1,000 mg by mouth every 6 (six) hours as needed (pain).    ? aspirin EC 81 MG tablet Take 1 tablet (81 mg total) by mouth daily. (Patient taking differently: Take 81 mg by mouth in the morning.) 90 tablet 3  ? benazepril (LOTENSIN) 5 MG tablet Take 1 tablet (5 mg total) by mouth daily. 90 tablet 3  ? blood glucose meter kit and supplies Dispense based on patient and insurance preference. Once daily testing dx e11.9 1 each 0  ? brexpiprazole (REXULTI) 1 MG TABS tablet Take 1 tablet (1 mg total) by mouth daily. 30 tablet 1  ? buPROPion (WELLBUTRIN XL) 150 MG 24 hr tablet Take 3 tablets (450 mg total) by mouth daily. 270 tablet 1  ? empagliflozin (JARDIANCE) 10 MG TABS tablet Take 1 tablet (10 mg total) by mouth daily before breakfast. 90 tablet 0  ? ergocalciferol (VITAMIN D2) 1.25 MG (50000 UT) capsule Take 1 capsule (50,000 Units total) by mouth once a week. 12 capsule 1  ? fluconazole (DIFLUCAN) 150 MG tablet Take 1 tablet (150 mg total) by mouth once for fungal infection. 2 tablet 0  ? glipiZIDE (GLUCOTROL XL) 10 MG 24 hr tablet Take 2 tablets (20 mg total) by mouth daily with breakfast. 180 tablet 1  ? megestrol (MEGACE) 40 MG tablet Take 1 tablet (40 mg total) by mouth in the morning. 30 tablet 11  ? metoprolol tartrate (LOPRESSOR) 25 MG tablet  Take 1 tablet (25 mg total) by mouth 2 (two) times daily. 180 tablet 3  ? rosuvastatin (CRESTOR) 5 MG tablet Take 1 tablet (5 mg total) by mouth daily. 90 tablet 3  ? SitaGLIPtin-MetFORMIN HCl (JANUMET XR) 100-1

## 2021-10-18 ENCOUNTER — Ambulatory Visit: Payer: No Typology Code available for payment source | Admitting: Family Medicine

## 2021-10-19 ENCOUNTER — Telehealth (INDEPENDENT_AMBULATORY_CARE_PROVIDER_SITE_OTHER): Payer: No Typology Code available for payment source | Admitting: Psychiatry

## 2021-10-19 ENCOUNTER — Encounter: Payer: Self-pay | Admitting: Psychiatry

## 2021-10-19 ENCOUNTER — Other Ambulatory Visit: Payer: Self-pay

## 2021-10-19 DIAGNOSIS — F330 Major depressive disorder, recurrent, mild: Secondary | ICD-10-CM | POA: Diagnosis not present

## 2021-10-19 DIAGNOSIS — F411 Generalized anxiety disorder: Secondary | ICD-10-CM

## 2021-10-19 DIAGNOSIS — G4700 Insomnia, unspecified: Secondary | ICD-10-CM | POA: Diagnosis not present

## 2021-10-19 NOTE — Patient Instructions (Addendum)
Continue venlafaxine 225 mg daily  ?Continue bupropion 450 mg daily ?Continue rexulti 1 mg daily   ?Next appointment:  5/11 at 4:30, video ?

## 2021-10-25 ENCOUNTER — Ambulatory Visit (HOSPITAL_COMMUNITY): Payer: No Typology Code available for payment source | Admitting: Psychiatry

## 2021-10-30 ENCOUNTER — Other Ambulatory Visit: Payer: Self-pay | Admitting: Family Medicine

## 2021-10-31 ENCOUNTER — Other Ambulatory Visit (HOSPITAL_COMMUNITY): Payer: Self-pay

## 2021-10-31 MED ORDER — ERGOCALCIFEROL 1.25 MG (50000 UT) PO CAPS
50000.0000 [IU] | ORAL_CAPSULE | ORAL | 1 refills | Status: DC
  Filled 2021-10-31: qty 12, 84d supply, fill #0

## 2021-10-31 MED ORDER — TRIAMTERENE-HCTZ 37.5-25 MG PO TABS
1.0000 | ORAL_TABLET | Freq: Every day | ORAL | 3 refills | Status: DC
  Filled 2021-10-31: qty 90, 90d supply, fill #0
  Filled 2022-03-13: qty 90, 90d supply, fill #1

## 2021-11-02 ENCOUNTER — Encounter: Payer: Self-pay | Admitting: Cardiology

## 2021-11-02 ENCOUNTER — Ambulatory Visit: Payer: No Typology Code available for payment source | Admitting: Cardiology

## 2021-11-02 VITALS — BP 112/82 | HR 74 | Ht 66.0 in | Wt 198.0 lb

## 2021-11-02 DIAGNOSIS — G4733 Obstructive sleep apnea (adult) (pediatric): Secondary | ICD-10-CM | POA: Diagnosis not present

## 2021-11-02 DIAGNOSIS — I10 Essential (primary) hypertension: Secondary | ICD-10-CM

## 2021-11-02 DIAGNOSIS — Z7189 Other specified counseling: Secondary | ICD-10-CM | POA: Diagnosis not present

## 2021-11-02 DIAGNOSIS — R002 Palpitations: Secondary | ICD-10-CM | POA: Diagnosis not present

## 2021-11-02 NOTE — Progress Notes (Signed)
? ? ? ? ? ?CARDIOLOGY CNOTE  ?Patient ID: ?[place] ?MRN: [id number] ?DOB/AGE: 10/02/1970 51 y.o. ? ?Admit date: (Not on file) ?Primary Physician: Fayrene Helper, MD ?Referring Physician: Fayrene Helper, MD ? ?Reason for Consultation: Palpitations ? ?HPI: Mary Roman is a 51 y.o. female with a past medical history of type 2 diabetes, hypertension, depression, and chronic pain. She was evaluated for chest tightness and palpitations several years ago and was prescribed Lopressor 25 mg daily and she has been maintained on that for the past several years. ? ?She had a recurrence of palpitations in September and October 2019.  This was exacerbated by problems with anxiety and depression which seem to be related to when she gets palpitations.   ? ?When she last saw me she was concerned of a family history of coronary disease and a coronary calcium score was 0.  She also had problems with excessive daytime sleepiness and there was concern for possible sleep apnea.  She underwent sleep study in November 2022 showing mild obstructive sleep apnea with an AHI of 8.9/h and O2 sats as low as 87%.  CPAP titration was recommended. ? ?Family history: Mother has a history of CVA and atrial fibrillation. ? 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 ? ? ?No Known Allergies ? ?Current Outpatient Medications  ?Medication Sig Dispense Refill  ? acetaminophen (TYLENOL) 500 MG tablet Take 1,000 mg by mouth every 6 (six) hours as needed (pain).    ? aspirin EC 81 MG tablet Take 1 tablet (81 mg total) by mouth daily. (Patient taking differently: Take 81 mg by mouth in the morning.) 90 tablet 3  ? benazepril (LOTENSIN) 5 MG tablet Take 1 tablet (5 mg total) by mouth daily. 90 tablet 3  ? blood glucose meter kit and supplies Dispense based on patient and insurance preference. Once daily testing dx e11.9 1 each 0  ? brexpiprazole (REXULTI) 1 MG TABS tablet Take 1 tablet (1 mg  total) by mouth daily. 30 tablet 1  ? buPROPion (WELLBUTRIN XL) 150 MG 24 hr tablet Take 3 tablets (450 mg total) by mouth daily. 270 tablet 1  ? empagliflozin (JARDIANCE) 10 MG TABS tablet Take 1 tablet (10 mg total) by mouth daily before breakfast. 90 tablet 0  ? ergocalciferol (VITAMIN D2) 1.25 MG (50000 UT) capsule Take 1 capsule (50,000 Units total) by mouth once a week. 12 capsule 1  ? fluconazole (DIFLUCAN) 150 MG tablet Take 1 tablet (150 mg total) by mouth once for fungal infection. 2 tablet 0  ? glipiZIDE (GLUCOTROL XL) 10 MG 24 hr tablet Take 2 tablets (20 mg total) by mouth daily with breakfast. 180 tablet 1  ? megestrol (MEGACE) 40 MG tablet Take 1 tablet (40 mg total) by mouth in the morning. 30 tablet 11  ? metoprolol tartrate (LOPRESSOR) 25 MG tablet Take 1 tablet (25 mg total) by mouth 2 (two) times daily. 180 tablet 3  ? rosuvastatin (CRESTOR) 5 MG tablet Take 1 tablet (5 mg total) by mouth daily. 90 tablet 3  ? SitaGLIPtin-MetFORMIN HCl (JANUMET XR) [phone] MG TB24 Take 1 tablet by mouth daily. 90 tablet 0  ? temazepam (RESTORIL) 30 MG capsule TAKE 1 CAPSULE BY MOUTH AT BEDTIME AS NEEDED FOR SLEEP 30 capsule 5  ? triamterene-hydrochlorothiazide (MAXZIDE-25) 37.5-25 MG tablet Take 1 tablet by mouth daily. 90 tablet 3  ? venlafaxine XR (EFFEXOR-XR) 150 MG 24 hr capsule Take 1 capsule (  150 mg total) by mouth daily along with 75 mg capsule to equal 225 mg total daily. 90 capsule 1  ? venlafaxine XR (EFFEXOR-XR) 75 MG 24 hr capsule Take 1 capsule (75 mg total) by mouth daily with 150 mg capsule to equal 225 mg total daily. 90 capsule 1  ? ?No current facility-administered medications for this visit.  ? ? ?Past Medical History:  ?Diagnosis Date  ? Absolute anemia 06/23/2018  ? Added automatically from request for surgery [id number]  ? Anemia   ? Anxiety   ? Backache 12/24/2007  ? Chronic back pain   ? Depression   ? Diabetes mellitus, type II ([place])   ? Headache(784.0)   ? Hypertension   ? Metabolic  syndrome X 73/71/0626  ? Obesity   ? Palpitation   ? Sickle cell trait ([place])   ? Thyromegaly   ? Unspecified vitamin D deficiency 02/19/2013  ? ? ?Past Surgical History:  ?Procedure Laterality Date  ? COLONOSCOPY WITH PROPOFOL N/A 01/03/2021  ? Procedure: COLONOSCOPY WITH PROPOFOL;  Surgeon: Harvel Quale, MD;  Location[facility];  Service: Gastroenterology;  Laterality: N/A;  AM  ? DILATATION AND CURETTAGE/HYSTEROSCOPY WITH MINERVA N/A 03/25/2019  ? Procedure: DILATATION AND CURETTAGE/HYSTEROSCOPY WITH  ATTEMPTED MINERVA ENDOMETRIAL ABLATION;  Surgeon: Florian Buff, MD;  Location: [facility];  Service: Gynecology;  Laterality: N/A;  ? None    ? ? ?Social History  ? ?Socioeconomic History  ? Marital status: Single  ?  Spouse name: Not on file  ? Number of children: 2  ? Years of education: Not on file  ? Highest education level: Not on file  ?Occupational History  ? Occupation: employed in medical office   ?Tobacco Use  ? Smoking status: Never  ? Smokeless tobacco: Never  ?Vaping Use  ? Vaping Use: Never used  ?Substance and Sexual Activity  ? Alcohol use: No  ? Drug use: No  ? Sexual activity: Yes  ?  Partners: Male  ?  Birth control/protection: Other-see comments  ?  Comment: bf had vasectomy  ?Other Topics Concern  ? Not on file  ?Social History Narrative  ? Not on file  ? ?Social Determinants of Health  ? ?Financial Resource Strain: Not on file  ?Food Insecurity: Not on file  ?Transportation Needs: Not on file  ?Physical Activity: Not on file  ?Stress: Not on file  ?Social Connections: Not on file  ?Intimate Partner Violence: Not on file  ? ? ? ? ?Current Meds  ?Medication Sig  ? acetaminophen (TYLENOL) 500 MG tablet Take 1,000 mg by mouth every 6 (six) hours as needed (pain).  ? aspirin EC 81 MG tablet Take 1 tablet (81 mg total) by mouth daily. (Patient taking differently: Take 81 mg by mouth in the morning.)  ? benazepril (LOTENSIN) 5 MG tablet Take 1 tablet (5 mg total) by mouth daily.  ? blood  glucose meter kit and supplies Dispense based on patient and insurance preference. Once daily testing dx e11.9  ? brexpiprazole (REXULTI) 1 MG TABS tablet Take 1 tablet (1 mg total) by mouth daily.  ? buPROPion (WELLBUTRIN XL) 150 MG 24 hr tablet Take 3 tablets (450 mg total) by mouth daily.  ? empagliflozin (JARDIANCE) 10 MG TABS tablet Take 1 tablet (10 mg total) by mouth daily before breakfast.  ? ergocalciferol (VITAMIN D2) 1.25 MG (50000 UT) capsule Take 1 capsule (50,000 Units total) by mouth once a week.  ? fluconazole (DIFLUCAN) 150 MG tablet Take  1 tablet (150 mg total) by mouth once for fungal infection.  ? glipiZIDE (GLUCOTROL XL) 10 MG 24 hr tablet Take 2 tablets (20 mg total) by mouth daily with breakfast.  ? megestrol (MEGACE) 40 MG tablet Take 1 tablet (40 mg total) by mouth in the morning.  ? metoprolol tartrate (LOPRESSOR) 25 MG tablet Take 1 tablet (25 mg total) by mouth 2 (two) times daily.  ? rosuvastatin (CRESTOR) 5 MG tablet Take 1 tablet (5 mg total) by mouth daily.  ? SitaGLIPtin-MetFORMIN HCl (JANUMET XR) [phone] MG TB24 Take 1 tablet by mouth daily.  ? temazepam (RESTORIL) 30 MG capsule TAKE 1 CAPSULE BY MOUTH AT BEDTIME AS NEEDED FOR SLEEP  ? triamterene-hydrochlorothiazide (MAXZIDE-25) 37.5-25 MG tablet Take 1 tablet by mouth daily.  ? venlafaxine XR (EFFEXOR-XR) 150 MG 24 hr capsule Take 1 capsule (150 mg total) by mouth daily along with 75 mg capsule to equal 225 mg total daily.  ? venlafaxine XR (EFFEXOR-XR) 75 MG 24 hr capsule Take 1 capsule (75 mg total) by mouth daily with 150 mg capsule to equal 225 mg total daily.  ?  ? ? ?Review of systems complete and found to be negative unless listed above in HPI ? ?Physical exam ?Blood pressure 112/82, pulse 74, height 5' 6"  (1.676 m), weight 198 lb (89.8 kg), SpO2 97 %. ?GEN: Well nourished, well developed in no acute distress ?HEENT: Normal ?NECK: No JVD; No carotid bruits ?LYMPHATICS: No lymphadenopathy ?CARDIAC:RRR, no murmurs, rubs,  gallops ?RESPIRATORY:  Clear to auscultation without rales, wheezing or rhonchi  ?ABDOMEN: Soft, non-tender, non-distended ?MUSCULOSKELETAL:  No edema; No deformity  ?SKIN: Warm and dry ?NEUROLOGIC:  Alert and or

## 2021-11-02 NOTE — Patient Instructions (Signed)
Medication Instructions:  ?Your physician recommends that you continue on your current medications as directed. Please refer to the Current Medication list given to you today. ? ?*If you need a refill on your cardiac medications before your next appointment, please call your pharmacy* ? ?Follow-Up: ?At [facility], you and your health needs are our priority.  As part of our continuing mission to provide you with exceptional heart care, we have created designated Provider Care Teams.  These Care Teams include your primary Cardiologist (physician) and Advanced Practice Providers (APPs -  Physician Assistants and Nurse Practitioners) who all work together to provide you with the care you need, when you need it. ? ?Follow up with Dr. Radford Pax 6 weeks after receiving your new device.  ?

## 2021-11-06 ENCOUNTER — Other Ambulatory Visit (HOSPITAL_COMMUNITY): Payer: Self-pay

## 2021-11-06 ENCOUNTER — Telehealth: Payer: Self-pay | Admitting: *Deleted

## 2021-11-06 DIAGNOSIS — I10 Essential (primary) hypertension: Secondary | ICD-10-CM

## 2021-11-06 DIAGNOSIS — G4700 Insomnia, unspecified: Secondary | ICD-10-CM

## 2021-11-06 DIAGNOSIS — G4719 Other hypersomnia: Secondary | ICD-10-CM

## 2021-11-06 DIAGNOSIS — G4733 Obstructive sleep apnea (adult) (pediatric): Secondary | ICD-10-CM

## 2021-11-06 NOTE — Telephone Encounter (Signed)
DME selection is [facility]. Patient understands he will be contacted by [facility] to set up his cpap. Patient understands to call if [facility] does not contact him with new setup in a timely manner. Patient understands they will be called once confirmation has been received from [facility]/ that they have received their new machine to schedule 10 week follow up appointment.   [facility] notified of new cpap order  Please add to airview Patient was grateful for the call and thanked me. 

## 2021-11-06 NOTE — Telephone Encounter (Signed)
Order placed to [facility] via community message. 

## 2021-11-06 NOTE — Telephone Encounter (Signed)
-----   Message from Antonieta Iba, RN sent at 11/02/2021  3:12 PM EDT ----- ?Per Dr. Radford Pax: ?Please order auto CPAP at 4-15cm H2O.  ?Follow up 6 weeks after receiving device.  ?Thanks! ? ?

## 2021-11-08 ENCOUNTER — Ambulatory Visit (HOSPITAL_COMMUNITY): Payer: No Typology Code available for payment source | Admitting: Psychiatry

## 2021-11-21 ENCOUNTER — Telehealth (HOSPITAL_COMMUNITY): Payer: Self-pay | Admitting: *Deleted

## 2021-11-21 NOTE — Telephone Encounter (Signed)
Patient called and would like to leave a message for Peggy. Pt stated that she will sch another appt in the next couple of weeks due to issues with son.

## 2021-11-22 ENCOUNTER — Ambulatory Visit (HOSPITAL_COMMUNITY): Payer: No Typology Code available for payment source | Admitting: Psychiatry

## 2021-11-28 ENCOUNTER — Encounter: Payer: Self-pay | Admitting: Family Medicine

## 2021-11-28 ENCOUNTER — Ambulatory Visit (INDEPENDENT_AMBULATORY_CARE_PROVIDER_SITE_OTHER): Payer: No Typology Code available for payment source | Admitting: Family Medicine

## 2021-11-28 ENCOUNTER — Other Ambulatory Visit (HOSPITAL_COMMUNITY): Payer: Self-pay

## 2021-11-28 VITALS — BP 125/82 | HR 75 | Resp 16 | Ht 65.0 in | Wt 197.0 lb

## 2021-11-28 DIAGNOSIS — I10 Essential (primary) hypertension: Secondary | ICD-10-CM | POA: Diagnosis not present

## 2021-11-28 DIAGNOSIS — E66811 Obesity, class 1: Secondary | ICD-10-CM

## 2021-11-28 DIAGNOSIS — E1159 Type 2 diabetes mellitus with other circulatory complications: Secondary | ICD-10-CM | POA: Diagnosis not present

## 2021-11-28 DIAGNOSIS — E785 Hyperlipidemia, unspecified: Secondary | ICD-10-CM | POA: Diagnosis not present

## 2021-11-28 DIAGNOSIS — E669 Obesity, unspecified: Secondary | ICD-10-CM

## 2021-11-28 DIAGNOSIS — F5104 Psychophysiologic insomnia: Secondary | ICD-10-CM

## 2021-11-28 DIAGNOSIS — F331 Major depressive disorder, recurrent, moderate: Secondary | ICD-10-CM

## 2021-11-28 DIAGNOSIS — E559 Vitamin D deficiency, unspecified: Secondary | ICD-10-CM

## 2021-11-28 LAB — POCT GLYCOSYLATED HEMOGLOBIN (HGB A1C): HbA1c, POC (controlled diabetic range): 8.2 % — AB (ref 0.0–7.0)

## 2021-11-28 MED ORDER — TIRZEPATIDE 5 MG/0.5ML ~~LOC~~ SOAJ
5.0000 mg | SUBCUTANEOUS | 0 refills | Status: DC
  Filled 2021-11-28: qty 6, 84d supply, fill #0

## 2021-11-28 MED ORDER — TIRZEPATIDE 2.5 MG/0.5ML ~~LOC~~ SOAJ
2.5000 mg | SUBCUTANEOUS | 0 refills | Status: DC
  Filled 2021-11-28: qty 2, 28d supply, fill #0

## 2021-11-28 MED ORDER — GLIPIZIDE ER 10 MG PO TB24
10.0000 mg | ORAL_TABLET | Freq: Every day | ORAL | 1 refills | Status: DC
  Filled 2021-11-28: qty 90, 90d supply, fill #0
  Filled 2022-09-21: qty 90, 90d supply, fill #1

## 2021-11-28 MED ORDER — HYDROXYZINE PAMOATE 50 MG PO CAPS
50.0000 mg | ORAL_CAPSULE | Freq: Every day | ORAL | 0 refills | Status: DC
  Filled 2021-11-28: qty 30, 30d supply, fill #0

## 2021-11-28 NOTE — Progress Notes (Signed)
? ?Mary Roman     MRN: [id number]      DOB: 1971/06/14 ? ? ?HPI ?Mary Roman is here for follow up and re-evaluation of chronic medical conditions, medication management and review of any available recent lab and radiology data.  ?Preventive health is updated, specifically  Cancer screening and Immunization.   ?Questions or concerns regarding consultations or procedures which the PT has had in the interim are  addressed. ?The PT denies any adverse reactions to current medications since the last visit.  ?3 mont6h h/o verty poor sleep, sleeps in spurts, every 2 hrs maybe,  ?Denies polyuria, polydipsia, blurred vision , or hypoglycemic episodes. ? ?ROS ?Denies recent fever or chills. ?Denies sinus pressure, nasal congestion, ear pain or sore throat. ?Denies chest congestion, productive cough or wheezing. ?Denies chest pains, palpitations and leg swelling ?Denies abdominal pain, nausea, vomiting,diarrhea or constipation.   ?Denies dysuria, frequency, hesitancy or incontinence. ?Denies joint pain, swelling and limitation in mobility. ?Denies headaches, seizures, numbness, or tingling. ?Denies uncontrolled  depression, anxiety  ?Denies skin break down or rash. ? ? ?PE ? ?BP 125/82   Pulse 75   Resp 16   Ht '5\' 5"'$  (1.651 m)   Wt 197 lb (89.4 kg)   SpO2 95%   BMI 32.78 kg/m?  ? ?Patient alert and oriented and in no cardiopulmonary distress. ? ?HEENT: No facial asymmetry, EOMI,     Neck supple . ? ?Chest: Clear to auscultation bilaterally. ? ?CVS: S1, S2 no murmurs, no S3.Regular rate. ? ?ABD: Soft non tender.  ? ?Ext: No edema ? ?MS: Adequate ROM spine, shoulders, hips and knees. ? ?Skin: Intact, no ulcerations or rash noted. ? ?Psych: Good eye contact, normal affect. Memory intact not anxious or depressed appearing. ? ?CNS: CN 2-12 intact, power,  normal throughout.no focal deficits noted. ? ? ?Assessment & Plan ? ?Essential hypertension ?Controlled, no change in medication ?DASH diet and commitment to  daily physical activity for a minimum of 30 minutes discussed and encouraged, as a part of hypertension management. ?The importance of attaining a healthy weight is also discussed. ? ? ?  11/28/2021  ?  2:51 PM 11/02/2021  ?  2:38 PM 08/29/2021  ?  3:28 PM 08/15/2021  ?  2:39 PM 07/18/2021  ?  2:46 PM 05/26/2021  ?  3:01 PM 05/09/2021  ?  3:48 PM  ?BP/Weight  ?Systolic BP 665 993 570  177 147 132  ?Diastolic BP 82 82 81  84 86 88  ?Wt. (Lbs) 197 198 202.12 198 194.12 194   ?BMI 32.78 kg/m2 31.96 kg/m2 33.63 kg/m2 32.95 kg/m2 32.3 kg/m2 32.28 kg/m2   ? ? ? ? ? ?Dyslipidemia (high LDL; low HDL) ?Hyperlipidemia:Low fat diet discussed and encouraged. ? ? ?Lipid Panel  ?Lab Results  ?Component Value Date  ? CHOL 107 08/25/2021  ? HDL 27 (L) 08/25/2021  ? [place] 68 08/25/2021  ? TRIG 62 08/25/2021  ? CHOLHDL 4.0 08/25/2021  ? ?Needs to increase exercise ? ? ?Obesity (BMI 30.0-34.9) ? ?Patient re-educated about  the importance of commitment to a  minimum of 150 minutes of exercise per week as able. ? ?The importance of healthy food choices with portion control discussed, as well as eating regularly and within a 12 hour window most days. ?The need to choose "clean , green" food 50 to 75% of the time is discussed, as well as to make water the primary drink and set a goal of 64 ounces water daily. ? ?  ? ?  11/28/2021  ?  2:51 PM 11/02/2021  ?  2:38 PM 08/29/2021  ?  3:28 PM  ?Weight /BMI  ?Weight 197 lb 198 lb 202 lb 1.9 oz  ?Height '5\' 5"'$  (1.651 m) '5\' 6"'$  (1.676 m) '5\' 5"'$  (1.651 m)  ?BMI 32.78 kg/m2 31.96 kg/m2 33.63 kg/m2  ? ? ? ? ?Type 2 diabetes mellitus with vascular disease ([place]) ?Mary Roman is reminded of the importance of commitment to daily physical activity for 30 minutes or more, as able and the need to limit carbohydrate intake to 30 to 60 grams per meal to help with blood sugar control.  ? ?The need to take medication as prescribed, test blood sugar as directed, and to call between visits if there is a concern that blood  sugar is uncontrolled is also discussed.  ? ?Mary Roman is reminded of the importance of daily foot exam, annual eye examination, and good blood sugar, blood pressure and cholesterol control. ?Improvising, start mounjaro, reduce metformin ? ?  Latest Ref Rng & Units 11/28/2021  ?  4:07 PM 08/25/2021  ?  8:25 AM 05/09/2021  ?  3:58 PM 01/03/2021  ?  6:50 AM 01/02/2021  ?  8:38 AM  ?Diabetic Labs  ?HbA1c 0.0 - 7.0 % 8.2   10.0   7.3      ?Chol 0 - 200 mg/dL  107       ?HDL >40 mg/dL  27       ?Calc LDL 0 - 99 mg/dL  68       ?Triglycerides <150 mg/dL  62       ?Creatinine 0.44 - 1.00 mg/dL  1.07   1.16   1.10   1.27    ? ? ?  11/28/2021  ?  2:51 PM 11/02/2021  ?  2:38 PM 08/29/2021  ?  3:28 PM 08/15/2021  ?  2:39 PM 07/18/2021  ?  2:46 PM 05/26/2021  ?  3:01 PM 05/09/2021  ?  3:48 PM  ?BP/Weight  ?Systolic BP 287 681 157  262 147 132  ?Diastolic BP 82 82 81  84 86 88  ?Wt. (Lbs) 197 198 202.12 198 194.12 194   ?BMI 32.78 kg/m2 31.96 kg/m2 33.63 kg/m2 32.95 kg/m2 32.3 kg/m2 32.28 kg/m2   ? ? ?  Latest Ref Rng & Units 07/18/2021  ?  2:40 PM 09/26/2020  ? 12:00 AM  ?Foot/eye exam completion dates  ?Eye Exam No Retinopathy  No Retinopathy       ?Foot Form Completion  Done   ?  ? This result is from an external source.  ? ? ? ? ? ? ?Insomnia ?Inadequately treated, Sleep hygiene reviewed and written information offered also. ?Prescription sent for  medication needed. ?Pt to start hydroxyzine and f/u wih Psych for management ? ?Major depressive disorder, recurrent episode, moderate ([place]) ?Managed by Psych and improved ? ?

## 2021-11-28 NOTE — Patient Instructions (Addendum)
F/U in 2 months, call if you need me before, shingrix #1 at visit ? ?Fasting lipid, cmp ad eGFr and Vit D 5 days before visit ? ?NEED to test blood sugar every morning and record it ?Goal for fasting blood sugar ranges from 80 to 120  ?New is once weekly Mounjaro for diabetes ?Continue jardiance daily ?STOP janumet ?Reduce glipizide 10 mg to once daily ? ?It is important that you exercise regularly at least 30 minutes 5 times a week. If you develop chest pain, have severe difficulty breathing, or feel very tired, stop exercising immediately and seek medical attention  ? ?New for sleep is hydroxyzine 1 at night, continue restoril, also discuss with Dr Modesta Messing please, she will be better able to treat your insomnia ? ?Please schedule and have your pap. Past due ? ?Overall you are improved ? ?Nurse please get eye report from [facility], my eye dr ? ?Thanks for choosing [facility], we consider it a privelige to serve you. ? ? ?

## 2021-11-29 ENCOUNTER — Other Ambulatory Visit (HOSPITAL_COMMUNITY): Payer: Self-pay

## 2021-11-30 LAB — HM DIABETES EYE EXAM

## 2021-12-03 ENCOUNTER — Encounter: Payer: Self-pay | Admitting: Family Medicine

## 2021-12-03 NOTE — Assessment & Plan Note (Signed)
Managed by Psych and improved ?

## 2021-12-03 NOTE — Assessment & Plan Note (Signed)
Hyperlipidemia:Low fat diet discussed and encouraged. ? ? ?Lipid Panel  ?Lab Results  ?Component Value Date  ? CHOL 107 08/25/2021  ? HDL 27 (L) 08/25/2021  ? [place] 68 08/25/2021  ? TRIG 62 08/25/2021  ? CHOLHDL 4.0 08/25/2021  ? ?Needs to increase exercise ? ?

## 2021-12-03 NOTE — Assessment & Plan Note (Signed)
?  Patient re-educated about  the importance of commitment to a  minimum of 150 minutes of exercise per week as able. ? ?The importance of healthy food choices with portion control discussed, as well as eating regularly and within a 12 hour window most days. ?The need to choose "clean , green" food 50 to 75% of the time is discussed, as well as to make water the primary drink and set a goal of 64 ounces water daily. ? ?  ? ?  11/28/2021  ?  2:51 PM 11/02/2021  ?  2:38 PM 08/29/2021  ?  3:28 PM  ?Weight /BMI  ?Weight 197 lb 198 lb 202 lb 1.9 oz  ?Height '5\' 5"'$  (1.651 m) '5\' 6"'$  (1.676 m) '5\' 5"'$  (1.651 m)  ?BMI 32.78 kg/m2 31.96 kg/m2 33.63 kg/m2  ? ? ? ?

## 2021-12-03 NOTE — Assessment & Plan Note (Signed)
Inadequately treated, Sleep hygiene reviewed and written information offered also. ?Prescription sent for  medication needed. ?Pt to start hydroxyzine and f/u wih Psych for management ?

## 2021-12-03 NOTE — Assessment & Plan Note (Signed)
Controlled, no change in medication ?DASH diet and commitment to daily physical activity for a minimum of 30 minutes discussed and encouraged, as a part of hypertension management. ?The importance of attaining a healthy weight is also discussed. ? ? ?  11/28/2021  ?  2:51 PM 11/02/2021  ?  2:38 PM 08/29/2021  ?  3:28 PM 08/15/2021  ?  2:39 PM 07/18/2021  ?  2:46 PM 05/26/2021  ?  3:01 PM 05/09/2021  ?  3:48 PM  ?BP/Weight  ?Systolic BP 478 412 820  813 147 132  ?Diastolic BP 82 82 81  84 86 88  ?Wt. (Lbs) 197 198 202.12 198 194.12 194   ?BMI 32.78 kg/m2 31.96 kg/m2 33.63 kg/m2 32.95 kg/m2 32.3 kg/m2 32.28 kg/m2   ? ? ? ? ?

## 2021-12-03 NOTE — Assessment & Plan Note (Signed)
Ms. Banta is reminded of the importance of commitment to daily physical activity for 30 minutes or more, as able and the need to limit carbohydrate intake to 30 to 60 grams per meal to help with blood sugar control.  ? ?The need to take medication as prescribed, test blood sugar as directed, and to call between visits if there is a concern that blood sugar is uncontrolled is also discussed.  ? ?Ms. Daise is reminded of the importance of daily foot exam, annual eye examination, and good blood sugar, blood pressure and cholesterol control. ?Improvising, start mounjaro, reduce metformin ? ?  Latest Ref Rng & Units 11/28/2021  ?  4:07 PM 08/25/2021  ?  8:25 AM 05/09/2021  ?  3:58 PM 01/03/2021  ?  6:50 AM 01/02/2021  ?  8:38 AM  ?Diabetic Labs  ?HbA1c 0.0 - 7.0 % 8.2   10.0   7.3      ?Chol 0 - 200 mg/dL  107       ?HDL >40 mg/dL  27       ?Calc LDL 0 - 99 mg/dL  68       ?Triglycerides <150 mg/dL  62       ?Creatinine 0.44 - 1.00 mg/dL  1.07   1.16   1.10   1.27    ? ? ?  11/28/2021  ?  2:51 PM 11/02/2021  ?  2:38 PM 08/29/2021  ?  3:28 PM 08/15/2021  ?  2:39 PM 07/18/2021  ?  2:46 PM 05/26/2021  ?  3:01 PM 05/09/2021  ?  3:48 PM  ?BP/Weight  ?Systolic BP 915 056 979  480 147 132  ?Diastolic BP 82 82 81  84 86 88  ?Wt. (Lbs) 197 198 202.12 198 194.12 194   ?BMI 32.78 kg/m2 31.96 kg/m2 33.63 kg/m2 32.95 kg/m2 32.3 kg/m2 32.28 kg/m2   ? ? ?  Latest Ref Rng & Units 07/18/2021  ?  2:40 PM 09/26/2020  ? 12:00 AM  ?Foot/eye exam completion dates  ?Eye Exam No Retinopathy  No Retinopathy       ?Foot Form Completion  Done   ?  ? This result is from an external source.  ? ? ? ? ? ?

## 2021-12-06 ENCOUNTER — Other Ambulatory Visit: Payer: Self-pay | Admitting: Family Medicine

## 2021-12-06 ENCOUNTER — Telehealth: Payer: Self-pay | Admitting: Family Medicine

## 2021-12-06 MED ORDER — TEMAZEPAM 30 MG PO CAPS: 30.0000 mg | ORAL_CAPSULE | Freq: Every day | ORAL | 5 refills | Status: DC

## 2021-12-06 NOTE — Progress Notes (Signed)
Virtual Visit via Video Note ? ?I connected with Mary Roman [place] on 12/07/21 at  4:30 PM EDT by a video enabled telemedicine application and verified that I am speaking with the correct person using two identifiers. ? ?Location: ?Patient: home ?Provider: office ?Persons participated in the visit- patient, provider  ?  ?I discussed the limitations of evaluation and management by telemedicine and the availability of in person appointments. The patient expressed understanding and agreed to proceed. ?  ?I discussed the assessment and treatment plan with the patient. The patient was provided an opportunity to ask questions and all were answered. The patient agreed with the plan and demonstrated an understanding of the instructions. ?  ?The patient was advised to call back or seek an in-person evaluation if the symptoms worsen or if the condition fails to improve as anticipated. ? ?I provided 18 minutes of non-face-to-face time during this encounter. ? ? ?[name], MD ? ? ? ?BH MD/PA/NP OP Progress Note ? ?12/07/2021 5:08 PM ?[place]  ?MRN:  [id number] ? ?Chief Complaint:  ?Chief Complaint  ?Patient presents with  ? Follow-up  ? Depression  ? ?HPI:  ?This is a follow-up appointment for depression and anxiety.  ?She states that she is not doing well since her 51 year old son was admitted for anxiety.  It has been difficult for her to talk about this as she does not know what to say.  However, she thinks he knows that she is there when needed.  She is worried about him, and it has affected her focus during work.  She had good time with her daughter, going to fair, and get nails done on the weekend.  She agrees to try doing things enjoyable for her while she may be worried about her son.  She states that the relationship with her fianc? is not good.  She does not feel connected.  She states that she has been sleeping deprived.  Although the temazepam used to work well, it has no benefit lately.  She was  advised by Dr. Moshe Cipro to discuss with this writer about this.  She denies change in appetite.  She denies SI.  She feels anxious since admission of her son.  Although she denies any hallucinations, she had hypnagogic hallucinations of hearing voices. She denies paranoia. She agrees with the following medication change.  ? ? 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  one son, one daughter ? ?Wt Readings from Last 3 Encounters:  ?11/28/21 197 lb (89.4 kg)  ?11/02/21 198 lb (89.8 kg)  ?08/29/21 202 lb 1.9 oz (91.7 kg)  ?  ? ?Visit Diagnosis:  ?  ICD-10-CM   ?1. GAD (generalized anxiety disorder)  F41.1   ?  ?2. MDD (major depressive disorder), recurrent episode, mild (HCC)  F33.0 venlafaxine XR (EFFEXOR-XR) 75 MG 24 hr capsule  ?  venlafaxine XR (EFFEXOR-XR) 150 MG 24 hr capsule  ?  ?3. Insomnia, unspecified type  G47.00   ?  ? ? ?Past Psychiatric History: Please see initial evaluation for full details. I have reviewed the history. No updates at this time.  ?  ? ?Past Medical History:  ?Past Medical History:  ?Diagnosis Date  ? Absolute anemia 06/23/2018  ? Added automatically from request for surgery [phone]  ? Anemia   ? Anxiety   ? Backache 12/24/2007  ? Chronic back pain   ? Depression   ? Diabetes  mellitus, type II ([place])   ? Headache(784.0)   ? Hypertension   ? Metabolic syndrome X 08/67/6195  ? Obesity   ? Palpitation   ? Sickle cell trait ([place])   ? Thyromegaly   ? Unspecified vitamin D deficiency 02/19/2013  ?  ?Past Surgical History:  ?Procedure Laterality Date  ? COLONOSCOPY WITH PROPOFOL N/A 01/03/2021  ? Procedure: COLONOSCOPY WITH PROPOFOL;  Surgeon: Harvel Quale, MD;  Location: [facility] SUITE;  Service: Gastroenterology;  Laterality: N/A;  AM  ? DILATATION AND CURETTAGE/HYSTEROSCOPY WITH MINERVA N/A 03/25/2019  ? Procedure: DILATATION AND CURETTAGE/HYSTEROSCOPY WITH   ATTEMPTED MINERVA ENDOMETRIAL ABLATION;  Surgeon: Florian Buff, MD;  Location: [facility] ORS;  Service: Gynecology;  Laterality: N/A;  ? None    ? ? ?Family Psychiatric History: Please see initial evaluation for full details. I have reviewed the history. No updates at this time.  ?  ? ?Family History:  ?Family History  ?Problem Relation Age of Onset  ? Diabetes Mother   ? Hypertension Mother   ? Stroke Mother   ? Colon cancer Mother   ? Alcohol abuse Mother   ? Cancer - Colon Mother 72  ? Pneumonia Father   ? Depression Sister   ? Hypertension Sister   ? Anxiety disorder Sister   ? Hypertension Sister   ? Leukemia Sister   ? Heart attack Maternal Uncle   ? Hypertension Maternal Grandfather   ? Diabetes Maternal Grandmother   ? Hypertension Maternal Grandmother   ? Depression Child   ? ? ?Social History:  ?Social History  ? ?Socioeconomic History  ? Marital status: Single  ?  Spouse name: Not on file  ? Number of children: 2  ? Years of education: Not on file  ? Highest education level: Not on file  ?Occupational History  ? Occupation: employed in medical office   ?Tobacco Use  ? Smoking status: Never  ? Smokeless tobacco: Never  ?Vaping Use  ? Vaping Use: Never used  ?Substance and Sexual Activity  ? Alcohol use: No  ? Drug use: No  ? Sexual activity: Yes  ?  Partners: Male  ?  Birth control/protection: Other-see comments  ?  Comment: bf had vasectomy  ?Other Topics Concern  ? Not on file  ?Social History Narrative  ? Not on file  ? ?Social Determinants of Health  ? ?Financial Resource Strain: Not on file  ?Food Insecurity: Not on file  ?Transportation Needs: Not on file  ?Physical Activity: Not on file  ?Stress: Not on file  ?Social Connections: Not on file  ? ? ?Allergies: No Known Allergies ? ?Metabolic Disorder Labs: ?Lab Results  ?Component Value Date  ? HGBA1C 8.2 (A) 11/28/2021  ? MPG 240.3 08/25/2021  ? MPG 200.12 04/18/2020  ? ?No results found for: PROLACTIN ?Lab Results  ?Component Value Date  ? CHOL 107  08/25/2021  ? TRIG 62 08/25/2021  ? HDL 27 (L) 08/25/2021  ? CHOLHDL 4.0 08/25/2021  ? VLDL 12 08/25/2021  ? [place] 68 08/25/2021  ? [place] 80 09/01/2020  ? ?Lab Results  ?Component Value Date  ? TSH 1.564 08/25/2021  ? TSH 1.400 09/01/2020  ? ? ?Therapeutic Level Labs: ?No results found for: LITHIUM ?No results found for: VALPROATE ?No components found for:  CBMZ ? ?Current Medications: ?Current Outpatient Medications  ?Medication Sig Dispense Refill  ? brexpiprazole (REXULTI) 1 MG TABS tablet Take 1 tablet (1 mg total) by mouth daily. 90 tablet 0  ? temazepam (  RESTORIL) 15 MG capsule Take 1 capsule (15 mg total) by mouth at bedtime as needed for up to 7 days for sleep. 7 capsule 0  ? [START ON 12/14/2021] zolpidem (AMBIEN) 5 MG tablet Take 1 tablet (5 mg total) by mouth at bedtime as needed for sleep. Start after tapering off temazepam 30 tablet 0  ? acetaminophen (TYLENOL) 500 MG tablet Take 1,000 mg by mouth every 6 (six) hours as needed (pain).    ? aspirin EC 81 MG tablet Take 1 tablet (81 mg total) by mouth daily. (Patient taking differently: Take 81 mg by mouth in the morning.) 90 tablet 3  ? benazepril (LOTENSIN) 5 MG tablet Take 1 tablet (5 mg total) by mouth daily. 90 tablet 3  ? blood glucose meter kit and supplies Dispense based on patient and insurance preference. Once daily testing dx e11.9 1 each 0  ? buPROPion (WELLBUTRIN XL) 150 MG 24 hr tablet Take 3 tablets (450 mg total) by mouth daily. 270 tablet 1  ? empagliflozin (JARDIANCE) 10 MG TABS tablet Take 1 tablet (10 mg total) by mouth daily before breakfast. 90 tablet 0  ? ergocalciferol (VITAMIN D2) 1.25 MG (50000 UT) capsule Take 1 capsule (50,000 Units total) by mouth once a week. 12 capsule 1  ? glipiZIDE (GLUCOTROL XL) 10 MG 24 hr tablet Take 1 tablet (10 mg total) by mouth daily with breakfast. 90 tablet 1  ? hydrOXYzine (VISTARIL) 50 MG capsule Take 1 capsule (50 mg total) by mouth at bedtime for sleep. 30 capsule 0  ? megestrol (MEGACE) 40  MG tablet Take 1 tablet (40 mg total) by mouth in the morning. 30 tablet 11  ? metoprolol tartrate (LOPRESSOR) 25 MG tablet Take 1 tablet (25 mg total) by mouth 2 (two) times daily. 180 tablet 3  ? rosuvastatin (CR

## 2021-12-06 NOTE — Telephone Encounter (Signed)
Requests refill of temazepam ?

## 2021-12-07 ENCOUNTER — Encounter: Payer: Self-pay | Admitting: Psychiatry

## 2021-12-07 ENCOUNTER — Other Ambulatory Visit (HOSPITAL_COMMUNITY): Payer: Self-pay

## 2021-12-07 ENCOUNTER — Telehealth (INDEPENDENT_AMBULATORY_CARE_PROVIDER_SITE_OTHER): Payer: No Typology Code available for payment source | Admitting: Psychiatry

## 2021-12-07 DIAGNOSIS — F411 Generalized anxiety disorder: Secondary | ICD-10-CM | POA: Diagnosis not present

## 2021-12-07 DIAGNOSIS — F330 Major depressive disorder, recurrent, mild: Secondary | ICD-10-CM

## 2021-12-07 DIAGNOSIS — G4700 Insomnia, unspecified: Secondary | ICD-10-CM | POA: Diagnosis not present

## 2021-12-07 MED ORDER — BREXPIPRAZOLE 1 MG PO TABS
1.0000 mg | ORAL_TABLET | Freq: Every day | ORAL | 0 refills | Status: AC
  Filled 2021-12-07: qty 90, 90d supply, fill #0

## 2021-12-07 MED ORDER — TEMAZEPAM 15 MG PO CAPS
15.0000 mg | ORAL_CAPSULE | Freq: Every evening | ORAL | 0 refills | Status: DC | PRN
  Filled 2021-12-07: qty 7, 7d supply, fill #0

## 2021-12-07 MED ORDER — VENLAFAXINE HCL ER 150 MG PO CP24
150.0000 mg | ORAL_CAPSULE | Freq: Every day | ORAL | 0 refills | Status: DC
  Filled 2021-12-07 – 2022-04-16 (×2): qty 90, 90d supply, fill #0

## 2021-12-07 MED ORDER — VENLAFAXINE HCL ER 75 MG PO CP24
75.0000 mg | ORAL_CAPSULE | Freq: Every day | ORAL | 0 refills | Status: DC
  Filled 2021-12-07 – 2022-03-13 (×2): qty 90, 90d supply, fill #0

## 2021-12-07 MED ORDER — ZOLPIDEM TARTRATE 5 MG PO TABS
5.0000 mg | ORAL_TABLET | Freq: Every evening | ORAL | 0 refills | Status: DC | PRN
  Filled 2021-12-07: qty 30, 30d supply, fill #0

## 2021-12-07 NOTE — Patient Instructions (Signed)
Continue venlafaxine 225 mg daily  ?Continue bupropion 450 mg daily ?Continue rexulti 1 mg daily   ?Decrease temazepam 15 mg at night for one week, then discontinue ?Start Ambien 5 mg at night as needed for insomnia after discontinuation of temazepam ?Next appointment:  6/7 at 4:30  ?

## 2021-12-19 ENCOUNTER — Telehealth: Payer: Self-pay

## 2021-12-19 NOTE — Telephone Encounter (Signed)
patient called states that the Mary Roman is not working and she needs to speak with you about what else she can do or try.

## 2021-12-19 NOTE — Telephone Encounter (Signed)
Please ask her if she is interested in trying Lunesta instead of Ambien for sleep.

## 2021-12-20 NOTE — Telephone Encounter (Signed)
left message to call office back.  

## 2021-12-26 ENCOUNTER — Other Ambulatory Visit (HOSPITAL_COMMUNITY): Payer: Self-pay

## 2021-12-26 ENCOUNTER — Other Ambulatory Visit: Payer: Self-pay | Admitting: Psychiatry

## 2021-12-26 MED ORDER — ESZOPICLONE 1 MG PO TABS
1.0000 mg | ORAL_TABLET | Freq: Every evening | ORAL | 0 refills | Status: DC | PRN
Start: 2021-12-26 — End: 2022-01-15
  Filled 2021-12-26: qty 30, 30d supply, fill #0

## 2021-12-26 NOTE — Telephone Encounter (Signed)
Pt states she will try the [place]

## 2021-12-26 NOTE — Telephone Encounter (Signed)
Noted! Thank you

## 2021-12-26 NOTE — Telephone Encounter (Signed)
Ordered. Please advise her to try Lunesta 1 mg at night as needed for insomnia instead of Ambien.

## 2022-01-01 NOTE — Progress Notes (Unsigned)
Virtual Visit via Video Note  I connected with Mary Roman on 01/03/22 at  4:30 PM EDT by a video enabled telemedicine application and verified that I am speaking with the correct person using two identifiers.  Location: Patient: home Provider: office Persons participated in the visit- patient, provider    I discussed the limitations of evaluation and management by telemedicine and the availability of in person appointments. The patient expressed understanding and agreed to proceed.    I discussed the assessment and treatment plan with the patient. The patient was provided an opportunity to ask questions and all were answered. The patient agreed with the plan and demonstrated an understanding of the instructions.   The patient was advised to call back or seek an in-person evaluation if the symptoms worsen or if the condition fails to improve as anticipated.  I provided 17 minutes of non-face-to-face time during this encounter.   [name], MD    [facility] MD/PA/NP OP Progress Note  01/03/2022 5:08 PM Mary Roman  MRN:  [id number]  Chief Complaint:  Chief Complaint  Patient presents with   Follow-up   Depression   HPI:  This is a follow-up appointment for depression and insomnia.  She states that she continues to have insomnia.  She is under a lot of stress.  She states that her boyfriend lost her job.  They hired a few people with the thought that he will have a surgery, which was cancelled.  She is also concerned that he might relapse into alcohol use.  They are considering of moving near her mother's house in [place] as it is more affordable.  She is also concerned about her son, who struggles with anxiety.  She is concerned about her daughter, who is quiet and she does not know how she is doing.  It has been difficult for her to relax.  She is miserable at work due to insomnia.  She is unable to concentrate well.  She denies any any weight change; she has slight  decrease in appetite since being on the new injection.  She feels depressed.  She denies SI.  She admits that she may be missing taking medication on weekends.  She is willing to try taking medication consistently.    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  one son, one 75 yo daughter  Visit Diagnosis:    ICD-10-CM   1. MDD (major depressive disorder), recurrent episode, mild ([place])  F33.0     2. GAD (generalized anxiety disorder)  F41.1     3. Insomnia, unspecified type  G47.00       Past Psychiatric History: Please see initial evaluation for full details. I have reviewed the history. No updates at this time.     Past Medical History:  Past Medical History:  Diagnosis Date   Absolute anemia 06/23/2018   Added automatically from request for surgery [id number]   Anemia    Anxiety    Backache 12/24/2007   Chronic back pain    Depression    Diabetes mellitus, type II ([place])    Headache(784.0)    Hypertension    Metabolic syndrome X 95/63/8756   Obesity    Palpitation    Sickle cell trait ([place])    Thyromegaly    Unspecified vitamin D deficiency 02/19/2013    Past Surgical History:  Procedure Laterality Date   COLONOSCOPY WITH PROPOFOL N/A 01/03/2021  Procedure: COLONOSCOPY WITH PROPOFOL;  Surgeon: Harvel Quale, MD;  Location: [facility];  Service: Gastroenterology;  Laterality: N/A;  AM   DILATATION AND CURETTAGE/HYSTEROSCOPY WITH MINERVA N/A 03/25/2019   Procedure: DILATATION AND CURETTAGE/HYSTEROSCOPY WITH  ATTEMPTED MINERVA ENDOMETRIAL ABLATION;  Surgeon: Florian Buff, MD;  Location: [facility] ORS;  Service: Gynecology;  Laterality: N/A;   None      Family Psychiatric History: Please see initial evaluation for full details. I have reviewed the history. No updates at this time.     Family History:  Family History  Problem Relation Age of  Onset   Diabetes Mother    Hypertension Mother    Stroke Mother    Colon cancer Mother    Alcohol abuse Mother    Cancer - Colon Mother 71   Pneumonia Father    Depression Sister    Hypertension Sister    Anxiety disorder Sister    Hypertension Sister    Leukemia Sister    Heart attack Maternal Uncle    Hypertension Maternal Grandfather    Diabetes Maternal Grandmother    Hypertension Maternal Grandmother    Depression Child     Social History:  Social History   Socioeconomic History   Marital status: Single    Spouse name: Not on file   Number of children: 2   Years of education: Not on file   Highest education level: Not on file  Occupational History   Occupation: employed in medical office   Tobacco Use   Smoking status: Never   Smokeless tobacco: Never  Vaping Use   Vaping Use: Never used  Substance and Sexual Activity   Alcohol use: No   Drug use: No   Sexual activity: Yes    Partners: Male    Birth control/protection: Other-see comments    Comment: bf had vasectomy  Other Topics Concern   Not on file  Social History Narrative   Not on file   Social Determinants of Health   Financial Resource Strain: Not on file  Food Insecurity: Not on file  Transportation Needs: Not on file  Physical Activity: Not on file  Stress: Not on file  Social Connections: Not on file    Allergies: No Known Allergies  Metabolic Disorder Labs: Lab Results  Component Value Date   HGBA1C 8.2 (A) 11/28/2021   MPG 240.3 08/25/2021   MPG 200.12 04/18/2020   No results found for: PROLACTIN Lab Results  Component Value Date   CHOL 107 08/25/2021   TRIG 62 08/25/2021   HDL 27 (L) 08/25/2021   CHOLHDL 4.0 08/25/2021   VLDL 12 08/25/2021   LDLCALC 68 08/25/2021   [place] 80 09/01/2020   Lab Results  Component Value Date   TSH 1.564 08/25/2021   TSH 1.400 09/01/2020    Therapeutic Level Labs: No results found for: LITHIUM No results found for: VALPROATE No  components found for:  CBMZ  Current Medications: Current Outpatient Medications  Medication Sig Dispense Refill   acetaminophen (TYLENOL) 500 MG tablet Take 1,000 mg by mouth every 6 (six) hours as needed (pain).     aspirin EC 81 MG tablet Take 1 tablet (81 mg total) by mouth daily. (Patient taking differently: Take 81 mg by mouth in the morning.) 90 tablet 3   benazepril (LOTENSIN) 5 MG tablet Take 1 tablet (5 mg total) by mouth daily. 90 tablet 3   blood glucose meter kit and supplies Dispense based on patient and insurance preference. Once daily  testing dx e11.9 1 each 0   brexpiprazole (REXULTI) 1 MG TABS tablet Take 1 tablet (1 mg total) by mouth daily. 90 tablet 0   buPROPion (WELLBUTRIN XL) 150 MG 24 hr tablet Take 3 tablets (450 mg total) by mouth daily. 270 tablet 1   empagliflozin (JARDIANCE) 10 MG TABS tablet Take 1 tablet (10 mg total) by mouth daily before breakfast. 90 tablet 0   ergocalciferol (VITAMIN D2) 1.25 MG (50000 UT) capsule Take 1 capsule (50,000 Units total) by mouth once a week. 12 capsule 1   eszopiclone (LUNESTA) 1 MG TABS tablet Take 1 tablet (1 mg total) by mouth at bedtime as needed for sleep. Take immediately before bedtime 30 tablet 0   glipiZIDE (GLUCOTROL XL) 10 MG 24 hr tablet Take 1 tablet (10 mg total) by mouth daily with breakfast. 90 tablet 1   hydrOXYzine (VISTARIL) 50 MG capsule Take 1 capsule (50 mg total) by mouth at bedtime for sleep. 30 capsule 0   megestrol (MEGACE) 40 MG tablet Take 1 tablet (40 mg total) by mouth in the morning. 30 tablet 11   metoprolol tartrate (LOPRESSOR) 25 MG tablet Take 1 tablet (25 mg total) by mouth 2 (two) times daily. 180 tablet 3   rosuvastatin (CRESTOR) 5 MG tablet Take 1 tablet (5 mg total) by mouth daily. 90 tablet 3   tirzepatide (MOUNJARO) 2.5 MG/0.5ML Pen Inject 2.5 mg into the skin once a week. 2 mL 0   tirzepatide (MOUNJARO) 5 MG/0.5ML Pen Inject 5 mg into the skin once a week. 6 mL 0    triamterene-hydrochlorothiazide (MAXZIDE-25) 37.5-25 MG tablet Take 1 tablet by mouth daily. 90 tablet 3   [START ON 01/30/2022] venlafaxine XR (EFFEXOR-XR) 150 MG 24 hr capsule Take 1 capsule (150 mg total) by mouth daily. Total of 225 mg daily. Take along with 75 mg cap 90 capsule 0   venlafaxine XR (EFFEXOR-XR) 75 MG 24 hr capsule Take 1 capsule (75 mg total) by mouth daily. Total of 225 mg daily. Take along with 150 mg cap 90 capsule 0   No current facility-administered medications for this visit.     Musculoskeletal: Strength & Muscle Tone:  N/A Gait & Station:  N/A Patient leans: N/A  Psychiatric Specialty Exam: Review of Systems  Psychiatric/Behavioral:  Positive for decreased concentration, dysphoric mood and sleep disturbance. Negative for agitation, behavioral problems, confusion, hallucinations, self-injury and suicidal ideas. The patient is nervous/anxious. The patient is not hyperactive.   All other systems reviewed and are negative.  There were no vitals taken for this visit.There is no height or weight on file to calculate BMI.  General Appearance: Fairly Groomed  Eye Contact:  Good  Speech:  Clear and Coherent  Volume:  Normal  Mood:   not good  Affect:  Appropriate, Congruent, and down  Thought Process:  Coherent  Orientation:  Full (Time, Place, and Person)  Thought Content: Logical   Suicidal Thoughts:  No  Homicidal Thoughts:  No  Memory:  Immediate;   Good  Judgement:  Good  Insight:  Good  Psychomotor Activity:  Normal  Concentration:  Concentration: Good and Attention Span: Good  Recall:  Good  Fund of Knowledge: Good  Language: Good  Akathisia:  No  Handed:  Right  AIMS (if indicated): not done  Assets:  Communication Skills Desire for Improvement  ADL's:  Intact  Cognition: WNL  Sleep:  Poor   Screenings: GAD-7    Flowsheet Row Counselor from 05/15/2018 in BE[facility]AV[facility]  [place] Office Visit from 01/08/2017 in  [facility]  Total GAD-7 Score 17 14      PHQ2-9    [place] Office Visit from 11/28/2021 in [facility] Counselor from 09/21/2021 in [place] Video Visit from 09/14/2021 in [place] Video Visit from 08/10/2021 in [place] Office Visit from 07/18/2021 in [facility]  PHQ-2 Total Score 2 3 2 2 2   PHQ-9 Total Score 6 10 9 8 4       Flowsheet Row Video Visit from 09/14/2021 in [place] Video Visit from 08/10/2021 in [place] Video Visit from 05/04/2021 in [place] Error: Q3, 4, or 5 should not be populated when Q2 is No No Risk Low Risk        Assessment and Plan:  Mary Roman is a 51 y.o. year old female with a history of depression, mild OSA, diabetes , who presents for follow up appointment for below.    1. MDD (major depressive disorder), recurrent episode, mild ([place]) 2. GAD (generalized anxiety disorder) She continues to report significant anxiety and depressive symptoms since the last visit.  Psychosocial stressors includes her boyfriend being out of work/financial strain, and her son with anxiety.  Other psychosocial stressors includes conflict with her siblings around the care of her mother, who has cancer.  After reviewing her medication, there is a concern of adherence.  She agrees to try improving her adherence.  Will continue venlafaxine, bupropion and rexulti to target depression and anxiety .  3. Insomnia, unspecified type She has mild OSA, and will be started on CPAP machine.  She has limited benefit from [place].  We will do uptitration to optimize treatment for insomnia.  Discussed potential risk of drowsiness.    Plan Continue venlafaxine 225 mg daily (75 mg filled on 2/28, 150 mg on 4/4) Continue bupropion  450 mg daily (270 tabs, filled on 05/2021) Continue rexulti 1 mg daily  (she is on metformin) Increase Lunesta 2 mg at night as needed for insomnia.  May try up to 3 mg at night after trying 2 mg for one week. (She will notify the office for refil) Next appointment: 7/12 at 4:30 for 30 mins, video (she declined in person visit due to work schedule) - on gabapentin for pain and hydroxyzine - on mounjouraro   Past trials of medication: fluoxetine, duloxetine, Abilify (weight gain),  buspirone, Trazodone ("weird" feeling) temazepam, Ambien, Lunesta, Xanax      The patient demonstrates the following risk factors for suicide: Chronic risk factors for suicide include: psychiatric disorder of depression, anxiety and chronic pain. Acute risk factors for suicide include: N/A. Protective factors for this patient include: positive social support, responsibility to others (children, family), coping skills and hope for the future. Considering these factors, the overall suicide risk at this point appears to be low. Patient is appropriate for outpatient follow up.           Collaboration of Care: Collaboration of Care: Other N/A  Patient/Guardian was advised Release of Information must be obtained prior to any record release in order to collaborate their care with an outside provider. Patient/Guardian was advised if they have not already done so to contact the registration department to sign all necessary forms in order for [place] to release information regarding their care.   Consent: Patient/Guardian gives verbal consent for treatment and assignment  of benefits for services provided during this visit. Patient/Guardian expressed understanding and agreed to proceed.    [name], MD 01/03/2022, 5:08 PM

## 2022-01-03 ENCOUNTER — Telehealth (INDEPENDENT_AMBULATORY_CARE_PROVIDER_SITE_OTHER): Payer: No Typology Code available for payment source | Admitting: Psychiatry

## 2022-01-03 ENCOUNTER — Encounter: Payer: Self-pay | Admitting: Psychiatry

## 2022-01-03 DIAGNOSIS — F330 Major depressive disorder, recurrent, mild: Secondary | ICD-10-CM

## 2022-01-03 DIAGNOSIS — F411 Generalized anxiety disorder: Secondary | ICD-10-CM

## 2022-01-03 DIAGNOSIS — G4700 Insomnia, unspecified: Secondary | ICD-10-CM | POA: Diagnosis not present

## 2022-01-03 NOTE — Patient Instructions (Signed)
Continue venlafaxine 225 mg daily  Continue bupropion 450 mg daily Continue rexulti 1 mg daily   Increase Lunesta 2 mg at night as needed for insomnia.  May try up to 3 mg at night after trying 2 mg for one week. Next appointment: 7/12 at 4:30

## 2022-01-10 ENCOUNTER — Other Ambulatory Visit (HOSPITAL_COMMUNITY): Payer: Self-pay

## 2022-01-10 ENCOUNTER — Other Ambulatory Visit: Payer: Self-pay | Admitting: Family Medicine

## 2022-01-10 MED ORDER — EMPAGLIFLOZIN 10 MG PO TABS
10.0000 mg | ORAL_TABLET | Freq: Every day | ORAL | 0 refills | Status: DC
Start: 2022-01-10 — End: 2022-06-06
  Filled 2022-01-10: qty 90, 90d supply, fill #0

## 2022-01-11 ENCOUNTER — Other Ambulatory Visit: Payer: Self-pay | Admitting: Family Medicine

## 2022-01-11 ENCOUNTER — Other Ambulatory Visit (HOSPITAL_COMMUNITY): Payer: Self-pay

## 2022-01-15 ENCOUNTER — Other Ambulatory Visit (HOSPITAL_COMMUNITY): Payer: Self-pay

## 2022-01-15 ENCOUNTER — Other Ambulatory Visit: Payer: Self-pay | Admitting: Family Medicine

## 2022-01-15 ENCOUNTER — Telehealth: Payer: Self-pay

## 2022-01-15 ENCOUNTER — Other Ambulatory Visit: Payer: Self-pay | Admitting: Psychiatry

## 2022-01-15 MED ORDER — ESZOPICLONE 3 MG PO TABS
3.0000 mg | ORAL_TABLET | Freq: Every evening | ORAL | 0 refills | Status: DC | PRN
Start: 2022-01-15 — End: 2022-02-01
  Filled 2022-01-15: qty 30, 30d supply, fill #0

## 2022-01-15 NOTE — Telephone Encounter (Signed)
Ordered Lunesta 3 mg at night as needed for insomnia

## 2022-01-15 NOTE — Telephone Encounter (Signed)
pt called left a message that you asked her to call you when she is taking the lunesta '3mg'$ .  pt also states she need a new refills

## 2022-01-17 ENCOUNTER — Other Ambulatory Visit (HOSPITAL_COMMUNITY): Payer: Self-pay

## 2022-01-17 MED ORDER — MOUNJARO 2.5 MG/0.5ML ~~LOC~~ SOAJ
2.5000 mg | SUBCUTANEOUS | 0 refills | Status: DC
  Filled 2022-01-17: qty 2, 28d supply, fill #0

## 2022-01-17 NOTE — Telephone Encounter (Signed)
Pt called wanting to know status of this refill

## 2022-01-23 ENCOUNTER — Ambulatory Visit (INDEPENDENT_AMBULATORY_CARE_PROVIDER_SITE_OTHER): Payer: No Typology Code available for payment source | Admitting: Psychiatry

## 2022-01-23 DIAGNOSIS — F330 Major depressive disorder, recurrent, mild: Secondary | ICD-10-CM

## 2022-01-23 NOTE — Progress Notes (Signed)
Virtual Visit via Video Note  I connected with Mary Roman on 01/23/22 at 4:05 PM EDT  by a video enabled telemedicine application and verified that I am speaking with the correct person using two identifiers.  Location: Patient: Home Provider: [facility] [place] office    I discussed the limitations of evaluation and management by telemedicine and the availability of in person appointments. The patient expressed understanding and agreed to proceed.  I provided 43 minutes of non-face-to-face time during this encounter.   Adah Salvage, LCSW    THERAPIST PROGRESS NOTE  Session Time: Tuesday 01/23/2022 4:05 PM - 4:48 PM   Participation Level: Active  Behavioral Response: CasualAlert/Depressed/Anxious/  Type of Therapy: Individual Therapy  Treatment Goals addressed:  Reduce frequency, intensity, and duration of depression symptoms as evidenced by patient having interaction or doing recreational activities with her children 2 times per week.  Progress on goals: Progressing  Interventions: CBT and Supportive  Summary: Mary Roman is a 51 y.o. female who initially was referred for services by psychiatrist Dr. Vanetta Shawl.  Patient reports experiencing symptoms of depression intermittently for the past 7 to 8 years.  She reports participating in therapy twice with [facility] counselor in the past.  She reports no psychiatric hospitalizations.  She began taking psychotropic medications many years ago as prescribed by PCP.  She is a returning patient to this clinician and last was seen in 2020.  She is resuming services per recommendation from Dr. Vanetta Shawl.  Patient states they have not been able to find the right medication cocktail.  She reports still feeling depressed, anxious, and hopeless all the time.  She states barely being able to make it to work.  She reports stress related to family dynamics including issues with her son and daughter.  Her son is suffering from depression  and her daughter internalizes her feelings.  She also reports stress regarding caretaker issues regarding her mother.  Patient last was seen via virtual visit about 3 months ago. She reports increased symptoms of anxiety and depression.  Per patient's report, symptoms worsened in April when her son was diagnosed with anxiety.  He is seeing eye doctor and has been prescribed medication.  She reports additional stress regarding fianc losing his job about 2 months ago.  Patient reports sleep difficulty, ruminating thoughts, poor motivation, fatigue, and depressed mood.    Suicidal/Homicidal: Nowithout intent/plan,   Therapist Response: Reviewed symptoms, administered PHQ-9 and GAD-7, discussed stressors, facilitated expression of thoughts and feelings, validated feelings, assisted patient identify helpful strategies she has used in the past to cope with adversity, discussed spirituality, assisted patient identify ways to use spirituality to develop coping statements to read twice daily, discussed the role of sleep hygiene and improving sleep patterns, developed plan with patient to set consistent bedtime and to turn off the television prior to going to bed, also developed plan with patient to develop a bedtime ritual   Plan: Return again in 1- 2 weeks.   Diagnosis: Axis I: MDD    Axis II: Deferred        Collaboration of Care: Other none needed at this session  Patient/Guardian was advised Release of Information must be obtained prior to any record release in order to collaborate their care with an outside provider. Patient/Guardian was advised if they have not already done so to contact the registration department to sign all necessary forms in order for [place] to release information regarding their care.   Consent: Patient/Guardian gives verbal  consent for treatment and assignment of benefits for services provided during this visit. Patient/Guardian expressed understanding and agreed to proceed.    Adah Salvage, LCSW 01/23/2022

## 2022-01-31 ENCOUNTER — Telehealth: Payer: Self-pay | Admitting: *Deleted

## 2022-01-31 ENCOUNTER — Ambulatory Visit: Payer: No Typology Code available for payment source | Admitting: Family Medicine

## 2022-01-31 NOTE — Telephone Encounter (Signed)
Could you ask her if she would like to try belsomra for sleep instead? Side effects includes drowsiness.

## 2022-01-31 NOTE — Telephone Encounter (Signed)
ATTEMPTED CALL HAD TO LVM TO ASK PER PROVIDER REQUEST : Could you ask her if she would like to try belsomra for sleep instead? Side effects includes drowsiness

## 2022-01-31 NOTE — Telephone Encounter (Signed)
PATIENT CALLED & STATED THAT THE Lunesta 3 mg at night as needed for insomnia.  STATES THE SLEEP MED ISN'T WORKING SAYS SHE HASN'T SLEPT IN 2 WEEKS  & TAKING MEDICATION AS PRESCRIBED.

## 2022-02-01 ENCOUNTER — Other Ambulatory Visit: Payer: Self-pay | Admitting: Psychiatry

## 2022-02-01 ENCOUNTER — Other Ambulatory Visit (HOSPITAL_COMMUNITY): Payer: Self-pay

## 2022-02-01 MED ORDER — BELSOMRA 5 MG PO TABS
5.0000 mg | ORAL_TABLET | Freq: Every evening | ORAL | 0 refills | Status: DC | PRN
Start: 2022-02-01 — End: 2022-02-28
  Filled 2022-02-01: qty 30, 30d supply, fill #0

## 2022-02-01 NOTE — Telephone Encounter (Signed)
Ordered Belsomra to try for insomnia. Please advise the patient to try this medication and discontinue Lunesta. Will plan to reassess at her next follow up appointment.

## 2022-02-01 NOTE — Telephone Encounter (Signed)
Spoke to pt--agreed to try the medication Rx Belsomra--send to [facility].

## 2022-02-01 NOTE — Telephone Encounter (Signed)
Notified pt Rx Belsomra sent to the pharmacy and to d/c lunesta.

## 2022-02-02 ENCOUNTER — Other Ambulatory Visit (HOSPITAL_COMMUNITY): Payer: Self-pay

## 2022-02-05 ENCOUNTER — Other Ambulatory Visit (HOSPITAL_COMMUNITY)
Admission: RE | Admit: 2022-02-05 | Discharge: 2022-02-05 | Disposition: A | Discharge status: Home / Self Care | Payer: No Typology Code available for payment source | Attending: Family Medicine | Admitting: Family Medicine

## 2022-02-05 ENCOUNTER — Other Ambulatory Visit: Payer: Self-pay | Admitting: Family Medicine

## 2022-02-05 ENCOUNTER — Other Ambulatory Visit (HOSPITAL_COMMUNITY): Payer: Self-pay

## 2022-02-05 DIAGNOSIS — I10 Essential (primary) hypertension: Secondary | ICD-10-CM | POA: Insufficient documentation

## 2022-02-05 DIAGNOSIS — E559 Vitamin D deficiency, unspecified: Secondary | ICD-10-CM | POA: Diagnosis not present

## 2022-02-05 DIAGNOSIS — E785 Hyperlipidemia, unspecified: Secondary | ICD-10-CM | POA: Diagnosis not present

## 2022-02-05 LAB — COMPREHENSIVE METABOLIC PANEL
ALT: 20 U/L (ref 0–44)
AST: 16 U/L (ref 15–41)
Albumin: 3.8 g/dL (ref 3.5–5.0)
Alkaline Phosphatase: 62 U/L (ref 38–126)
Anion gap: 5 (ref 5–15)
BUN: 7 mg/dL (ref 6–20)
CO2: 28 mmol/L (ref 22–32)
Calcium: 8.9 mg/dL (ref 8.9–10.3)
Chloride: 108 mmol/L (ref 98–111)
Creatinine, Ser: 1.02 mg/dL — ABNORMAL HIGH (ref 0.44–1.00)
GFR, Estimated: >60 mL/min (ref >60)
Glucose, Bld: 160 mg/dL — ABNORMAL HIGH (ref 70–99)
Potassium: 3 mmol/L — ABNORMAL LOW (ref 3.5–5.1)
Sodium: 141 mmol/L (ref 135–145)
Total Bilirubin: 0.7 mg/dL (ref 0.3–1.2)
Total Protein: 7.5 g/dL (ref 6.5–8.1)

## 2022-02-05 LAB — LIPID PANEL
Cholesterol: 118 mg/dL (ref 0–200)
HDL: 31 mg/dL — ABNORMAL LOW (ref >40)
LDL Cholesterol: 76 mg/dL (ref 0–99)
Total CHOL/HDL Ratio: 3.8 RATIO
Triglycerides: 55 mg/dL (ref <150)
VLDL: 11 mg/dL (ref 0–40)

## 2022-02-05 LAB — VITAMIN D 25 HYDROXY (VIT D DEFICIENCY, FRACTURES): Vit D, 25-Hydroxy: 120.04 ng/mL — ABNORMAL HIGH (ref 30–100)

## 2022-02-05 MED ORDER — POTASSIUM CHLORIDE CRYS ER 20 MEQ PO TBCR: 20.0000 meq | EXTENDED_RELEASE_TABLET | Freq: Every day | ORAL | 3 refills | Status: DC

## 2022-02-05 NOTE — Progress Notes (Unsigned)
Virtual Visit via Video Note  I connected with Mary Roman on 02/07/22 at  4:30 PM EDT by a video enabled telemedicine application and verified that I am speaking with the correct person using two identifiers.  Location: Patient: home Provider: office Persons participated in the visit- patient, provider    I discussed the limitations of evaluation and management by telemedicine and the availability of in person appointments. The patient expressed understanding and agreed to proceed.     I discussed the assessment and treatment plan with the patient. The patient was provided an opportunity to ask questions and all were answered. The patient agreed with the plan and demonstrated an understanding of the instructions.   The patient was advised to call back or seek an in-person evaluation if the symptoms worsen or if the condition fails to improve as anticipated.  I provided 15 minutes of non-face-to-face time during this encounter.   [name], MD    [facility] MD/PA/NP OP Progress Note  02/07/2022 5:05 PM Mary Roman  MRN:  [id number]  Chief Complaint:  Chief Complaint  Patient presents with   Depression   Follow-up   HPI:  This is a follow-up appointment for depression and anxiety.  She states that she continues to struggle with initial and middle insomnia.  Higher dose of Lunesta did not work.  She has started Belsomra several days ago, and it has not worked so far.  She feels miserable at work.  She feels anxious and tense.  Although she used to be able to sleep on weekends, it is now difficult due to the things going on at home.  She is concerned about her son, who struggles with anxiety.  He tends to get defensive when she tries to talk with him.  She agrees to talk with him if he will be open for her to contact with PCP regarding his mental health.  The relationship with her fianc is not good.  Although they have been trying to do better, it has been difficult.  She  feels hopeless.  Her mood is horrible, and she reports significant difficulty in concentration.  She denies SI.  She denies alcohol use or drug use.  She has started to use pillbox.  She has been able to take medication consistently since the last visit.  She agrees with the medication change as below.    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  one son, one 52 yo daughter  Visit Diagnosis:    ICD-10-CM   1. MDD (major depressive disorder), recurrent episode, moderate (HCC)  F33.1     2. GAD (generalized anxiety disorder)  F41.1     3. Insomnia, unspecified type  G47.00       Past Psychiatric History: Please see initial evaluation for full details. I have reviewed the history. No updates at this time.     Past Medical History:  Past Medical History:  Diagnosis Date   Absolute anemia 06/23/2018   Added automatically from request for surgery [id number]   Anemia    Anxiety    Backache 12/24/2007   Chronic back pain    Depression    Diabetes mellitus, type II ([place])    Headache(784.0)  Hypertension    Metabolic syndrome X 37/34/2876   Obesity    Palpitation    Sickle cell trait ([place])    Thyromegaly    Unspecified vitamin D deficiency 02/19/2013    Past Surgical History:  Procedure Laterality Date   COLONOSCOPY WITH PROPOFOL N/A 01/03/2021   Procedure: COLONOSCOPY WITH PROPOFOL;  Surgeon: Harvel Quale, MD;  Location: [facility];  Service: Gastroenterology;  Laterality: N/A;  AM   DILATATION AND CURETTAGE/HYSTEROSCOPY WITH MINERVA N/A 03/25/2019   Procedure: DILATATION AND CURETTAGE/HYSTEROSCOPY WITH  ATTEMPTED MINERVA ENDOMETRIAL ABLATION;  Surgeon: Florian Buff, MD;  Location: [facility] ORS;  Service: Gynecology;   Laterality: N/A;   None      Family Psychiatric History: Please see initial evaluation for full details. I have reviewed the history. No updates at this time.     Family History:  Family History  Problem Relation Age of Onset   Diabetes Mother    Hypertension Mother    Stroke Mother    Colon cancer Mother    Alcohol abuse Mother    Cancer - Colon Mother 48   Pneumonia Father    Depression Sister    Hypertension Sister    Anxiety disorder Sister    Hypertension Sister    Leukemia Sister    Heart attack Maternal Uncle    Hypertension Maternal Grandfather    Diabetes Maternal Grandmother    Hypertension Maternal Grandmother    Depression Child     Social History:  Social History   Socioeconomic History   Marital status: Single    Spouse name: Not on file   Number of children: 2   Years of education: Not on file   Highest education level: Not on file  Occupational History   Occupation: employed in medical office   Tobacco Use   Smoking status: Never   Smokeless tobacco: Never  Vaping Use   Vaping Use: Never used  Substance and Sexual Activity   Alcohol use: No   Drug use: No   Sexual activity: Yes    Partners: Male    Birth control/protection: Other-see comments    Comment: bf had vasectomy  Other Topics Concern   Not on file  Social History Narrative   Not on file   Social Determinants of Health   Financial Resource Strain: Not on file  Food Insecurity: Not on file  Transportation Needs: Not on file  Physical Activity: Not on file  Stress: Not on file  Social Connections: Not on file    Allergies: No Known Allergies  Metabolic Disorder Labs: Lab Results  Component Value Date   HGBA1C 8.2 (A) 11/28/2021   MPG 240.3 08/25/2021   MPG 200.12 04/18/2020   No results found for: "PROLACTIN" Lab Results  Component Value Date   CHOL 118 02/05/2022   TRIG 55 02/05/2022   HDL 31 (L) 02/05/2022   CHOLHDL 3.8 02/05/2022   VLDL 11 02/05/2022    LDLCALC 76 02/05/2022   LDLCALC 68 08/25/2021   Lab Results  Component Value Date   TSH 1.564 08/25/2021   TSH 1.400 09/01/2020    Therapeutic Level Labs: No results found for: "LITHIUM" No results found for: "VALPROATE" No results found for: "CBMZ"  Current Medications: Current Outpatient Medications  Medication Sig Dispense Refill   clonazePAM (KLONOPIN) 0.5 MG tablet Take 1 tablet (0.5 mg total) by mouth daily as needed (severe anxiety). 30 tablet 0   acetaminophen (TYLENOL) 500 MG tablet Take 1,000 mg by mouth  every 6 (six) hours as needed (pain).     aspirin EC 81 MG tablet Take 1 tablet (81 mg total) by mouth daily. (Patient taking differently: Take 81 mg by mouth in the morning.) 90 tablet 3   benazepril (LOTENSIN) 5 MG tablet Take 1 tablet (5 mg total) by mouth daily. 90 tablet 3   blood glucose meter kit and supplies Dispense based on patient and insurance preference. Once daily testing dx e11.9 1 each 0   brexpiprazole (REXULTI) 1 MG TABS tablet Take 1 tablet (1 mg total) by mouth daily. 90 tablet 0   buPROPion (WELLBUTRIN XL) 150 MG 24 hr tablet Take 3 tablets (450 mg total) by mouth daily. 270 tablet 1   empagliflozin (JARDIANCE) 10 MG TABS tablet Take 1 tablet (10 mg total) by mouth daily before breakfast. 90 tablet 0   glipiZIDE (GLUCOTROL XL) 10 MG 24 hr tablet Take 1 tablet (10 mg total) by mouth daily with breakfast. 90 tablet 1   megestrol (MEGACE) 40 MG tablet Take 1 tablet (40 mg total) by mouth in the morning. 30 tablet 11   metoprolol tartrate (LOPRESSOR) 25 MG tablet Take 1 tablet (25 mg total) by mouth 2 (two) times daily. 180 tablet 3   potassium chloride SA (KLOR-CON M) 20 MEQ tablet Take 1 tablet (20 mEq total) by mouth daily. 30 tablet 3   rosuvastatin (CRESTOR) 5 MG tablet Take 1 tablet (5 mg total) by mouth daily. 90 tablet 3   Suvorexant (BELSOMRA) 5 MG TABS Take 5 mg by mouth at bedtime as needed (insomnia). 30 tablet 0   tirzepatide (MOUNJARO) 5  MG/0.5ML Pen Inject 5 mg into the skin once a week. 6 mL 0   triamterene-hydrochlorothiazide (MAXZIDE-25) 37.5-25 MG tablet Take 1 tablet by mouth daily. 90 tablet 3   venlafaxine XR (EFFEXOR-XR) 150 MG 24 hr capsule Take 1 capsule (150 mg total) by mouth daily. Total of 225 mg daily. Take along with 75 mg cap 90 capsule 0   venlafaxine XR (EFFEXOR-XR) 75 MG 24 hr capsule Take 1 capsule (75 mg total) by mouth daily. Total of 225 mg daily. Take along with 150 mg cap 90 capsule 0   No current facility-administered medications for this visit.     Musculoskeletal: Strength & Muscle Tone:  N/A Gait & Station:  N/A Patient leans: N/A  Psychiatric Specialty Exam: Review of Systems  Psychiatric/Behavioral:  Positive for decreased concentration, dysphoric mood and sleep disturbance. Negative for agitation, behavioral problems, confusion, hallucinations, self-injury and suicidal ideas. The patient is nervous/anxious. The patient is not hyperactive.   All other systems reviewed and are negative.   There were no vitals taken for this visit.There is no height or weight on file to calculate BMI.  General Appearance: Fairly Groomed  Eye Contact:  Good  Speech:  Clear and Coherent  Volume:  Normal  Mood:  Depressed  Affect:  Appropriate, Congruent, and Restricted  Thought Process:  Coherent  Orientation:  Full (Time, Place, and Person)  Thought Content: Logical   Suicidal Thoughts:  No  Homicidal Thoughts:  No  Memory:  Immediate;   Good  Judgement:  Good  Insight:  Good  Psychomotor Activity:  Normal  Concentration:  Concentration: Good and Attention Span: Good  Recall:  Good  Fund of Knowledge: Good  Language: Good  Akathisia:  No  Handed:  Right  AIMS (if indicated): not done  Assets:  Communication Skills Desire for Improvement  ADL's:  Intact  Cognition:  WNL  Sleep:  Poor   Screenings: GAD-7    Flowsheet Row Office Visit from 02/07/2022 in [facility] Counselor from  01/23/2022 in [place] Counselor from 05/15/2018 in [place] Office Visit from 01/08/2017 in [facility]  Total GAD-7 Score _0 PHQ2-9    [place] Office Visit from 02/07/2022 in [facility] Counselor from 01/23/2022 in [place] Office Visit from 11/28/2021 in [facility] Counselor from 09/21/2021 in [facility] Video Visit from 09/14/2021 in [place]  PHQ-2 Total Score _1 PHQ-9 Total Score _2 Flowsheet Row Video Visit from 09/14/2021 in [place] Video Visit from 08/10/2021 in [place] Video Visit from 05/04/2021 in [place] Error: Q3, 4, or 5 should not be populated when Q2 is No No Risk Low Risk        Assessment and Plan:  BENNETT VANSCYOC is a 51 y.o. year old female with a history of depression, mild OSA, diabetes, who presents for follow up appointment for below.    1. MDD (major depressive disorder), recurrent episode, moderate ([place]) 2. GAD (generalized anxiety disorder) She continues to report significant anxiety and depressive symptoms since the last visit. Psychosocial stressors includes her boyfriend being out of work/financial strain, and her son with anxiety.  Other psychosocial stressors includes conflict with her siblings around the care of her mother, who has cancer.  Will add clonazepam as needed for severe anxiety with the hope that her mood improves as she has started to take her medication consistently over the past month.  Discussed potential risk of drowsiness, dependence and tolerance.  Will continue venlafaxine, bupropion, rexulti to target depression and anxiety.   3.  Insomnia, unspecified type She has mild OSA, and will be started on CPAP machine.  Johnnye Sima was switched to Belsomra due to limited benefit.  She agrees to continue current dose at this time with the medication change as above.     Plan Continue venlafaxine 225 mg daily (75 mg filled on 2/28, 150 mg on 4/4) Continue bupropion 450 mg daily (270 tabs, filled on 05/2021) Continue rexulti 1 mg daily  (she is on metformin) Continue Belsomra 5 mg at night as needed for sleep  Start clonazepam 0.5 mg daily as needed for anxiety  Next appointment: 8/2 at 4:30 for 30 mins, video (she declined in person visit due to work schedule) - on gabapentin for pain and hydroxyzine - on mounjouraro   Past trials of medication: fluoxetine, duloxetine, Abilify (weight gain),  buspirone, Trazodone ("weird" feeling) temazepam, Ambien, Lunesta, Xanax      The patient demonstrates the following risk factors for suicide: Chronic risk factors for suicide include: psychiatric disorder of depression, anxiety and chronic pain. Acute risk factors for suicide include: N/A. Protective factors for this patient include: positive social support, responsibility to others (children, family), coping skills and hope for the future. Considering these factors, the overall suicide risk at this point appears to be low. Patient is appropriate for outpatient follow up.           Collaboration of Care: Collaboration of Care: Other communicates with Dr. Moshe Cipro, her PCP  Patient/Guardian was advised Release of Information must be obtained prior to any record release  in order to collaborate their care with an outside provider. Patient/Guardian was advised if they have not already done so to contact the registration department to sign all necessary forms in order for [place] to release information regarding their care.   Consent: Patient/Guardian gives verbal consent for treatment and assignment of benefits for services provided during this  visit. Patient/Guardian expressed understanding and agreed to proceed.    [name], MD 02/07/2022, 5:05 PM

## 2022-02-05 NOTE — Progress Notes (Signed)
Nurse to notify pharmacy also

## 2022-02-05 NOTE — Progress Notes (Signed)
Low potassium, tab prescribed

## 2022-02-06 ENCOUNTER — Other Ambulatory Visit (HOSPITAL_COMMUNITY): Payer: Self-pay

## 2022-02-07 ENCOUNTER — Encounter: Payer: Self-pay | Admitting: Family Medicine

## 2022-02-07 ENCOUNTER — Encounter: Payer: Self-pay | Admitting: Psychiatry

## 2022-02-07 ENCOUNTER — Telehealth (INDEPENDENT_AMBULATORY_CARE_PROVIDER_SITE_OTHER): Payer: No Typology Code available for payment source | Admitting: Psychiatry

## 2022-02-07 ENCOUNTER — Ambulatory Visit (INDEPENDENT_AMBULATORY_CARE_PROVIDER_SITE_OTHER): Payer: No Typology Code available for payment source | Admitting: Family Medicine

## 2022-02-07 ENCOUNTER — Other Ambulatory Visit (HOSPITAL_COMMUNITY): Payer: Self-pay

## 2022-02-07 VITALS — BP 130/88 | HR 92 | Ht 65.0 in | Wt 190.0 lb

## 2022-02-07 DIAGNOSIS — I10 Essential (primary) hypertension: Secondary | ICD-10-CM | POA: Diagnosis not present

## 2022-02-07 DIAGNOSIS — E1159 Type 2 diabetes mellitus with other circulatory complications: Secondary | ICD-10-CM

## 2022-02-07 DIAGNOSIS — Z23 Encounter for immunization: Secondary | ICD-10-CM

## 2022-02-07 DIAGNOSIS — E669 Obesity, unspecified: Secondary | ICD-10-CM

## 2022-02-07 DIAGNOSIS — E785 Hyperlipidemia, unspecified: Secondary | ICD-10-CM

## 2022-02-07 DIAGNOSIS — F411 Generalized anxiety disorder: Secondary | ICD-10-CM | POA: Diagnosis not present

## 2022-02-07 DIAGNOSIS — F331 Major depressive disorder, recurrent, moderate: Secondary | ICD-10-CM | POA: Diagnosis not present

## 2022-02-07 DIAGNOSIS — F5104 Psychophysiologic insomnia: Secondary | ICD-10-CM

## 2022-02-07 DIAGNOSIS — G4700 Insomnia, unspecified: Secondary | ICD-10-CM

## 2022-02-07 DIAGNOSIS — E559 Vitamin D deficiency, unspecified: Secondary | ICD-10-CM

## 2022-02-07 MED ORDER — CLONAZEPAM 0.5 MG PO TABS
0.5000 mg | ORAL_TABLET | Freq: Every day | ORAL | 0 refills | Status: DC | PRN
Start: 2022-02-07 — End: 2022-02-28
  Filled 2022-02-07: qty 30, 30d supply, fill #0

## 2022-02-07 MED ORDER — TIRZEPATIDE 5 MG/0.5ML ~~LOC~~ SOAJ
5.0000 mg | SUBCUTANEOUS | 0 refills | Status: DC
Start: 2022-02-07 — End: 2022-07-10
  Filled 2022-02-07: qty 6, 84d supply, fill #0

## 2022-02-07 NOTE — Patient Instructions (Signed)
Continue venlafaxine 225 mg daily  Continue bupropion 450 mg daily  Continue rexulti 1 mg daily   Continue Belsomra 5 mg at night as needed for sleep  Start clonazepam 0.5 mg daily as needed for anxiety  Next appointment: 8/2 at 4:30

## 2022-02-07 NOTE — Patient Instructions (Addendum)
F/u in 9 weeks re eval diabetes, and Tdap, call if you need me sooner  Shingrix #1 today  New higher dose Mounjaro is prescribed  to start this Sunday  Non fasting hBA1C, chem 7 and EGFR and Vit D 5 days before next visit   I will message Dr Modesta Messing regarding your sleep concern and you discuss with her at your visit this afternoon  NEED pap, pls call and schedule, past due  It is important that you exercise regularly at least 30 minutes 5 times a week. If you develop chest pain, have severe difficulty breathing, or feel very tired, stop exercising immediately and seek medical attention  Thanks for choosing [facility], we consider it a privelige to serve you.

## 2022-02-08 ENCOUNTER — Encounter: Payer: Self-pay | Admitting: Family Medicine

## 2022-02-08 ENCOUNTER — Other Ambulatory Visit (HOSPITAL_COMMUNITY): Payer: Self-pay

## 2022-02-08 NOTE — Assessment & Plan Note (Signed)
Not treated , pt having significant difficulty functioning , Psych to treat

## 2022-02-08 NOTE — Assessment & Plan Note (Signed)
Not at goal, no med change will f/u DASH diet and commitment to daily physical activity for a minimum of 30 minutes discussed and encouraged, as a part of hypertension management. The importance of attaining a healthy weight is also discussed.     02/07/2022    3:06 PM 02/07/2022    2:36 PM 11/28/2021    2:51 PM 11/02/2021    2:38 PM 08/29/2021    3:28 PM 08/15/2021    2:39 PM 07/18/2021    2:46 PM  BP/Weight  Systolic BP 528 413 244 010 272  536  Diastolic BP 88 88 82 82 81  84  Wt. (Lbs)  190.04 197 198 202.12 198 194.12  BMI  31.62 kg/m2 32.78 kg/m2 31.96 kg/m2 33.63 kg/m2 32.95 kg/m2 32.3 kg/m2

## 2022-02-08 NOTE — Assessment & Plan Note (Signed)
Improved  Patient re-educated about  the importance of commitment to a  minimum of 150 minutes of exercise per week as able.  The importance of healthy food choices with portion control discussed, as well as eating regularly and within a 12 hour window most days. The need to choose "clean , green" food 50 to 75% of the time is discussed, as well as to make water the primary drink and set a goal of 64 ounces water daily.       02/07/2022    2:36 PM 11/28/2021    2:51 PM 11/02/2021    2:38 PM  Weight /BMI  Weight 190 lb 0.6 oz 197 lb 198 lb  Height '5\' 5"'$  (1.651 m) '5\' 5"'$  (1.651 m) '5\' 6"'$  (1.676 m)  BMI 31.62 kg/m2 32.78 kg/m2 31.96 kg/m2

## 2022-02-08 NOTE — Assessment & Plan Note (Signed)
Hyperlipidemia:Low fat diet discussed and encouraged.   Lipid Panel  Lab Results  Component Value Date   CHOL 118 02/05/2022   HDL 31 (L) 02/05/2022   LDLCALC 76 02/05/2022   TRIG 55 02/05/2022   CHOLHDL 3.8 02/05/2022     Needs to inc exercise

## 2022-02-08 NOTE — Assessment & Plan Note (Signed)
Uncontrolled , tolerating med well, increase dose and re eval in 2 months Mary Roman is reminded of the importance of commitment to daily physical activity for 30 minutes or more, as able and the need to limit carbohydrate intake to 30 to 60 grams per meal to help with blood sugar control.   The need to take medication as prescribed, test blood sugar as directed, and to call between visits if there is a concern that blood sugar is uncontrolled is also discussed.   Mary Roman is reminded of the importance of daily foot exam, annual eye examination, and good blood sugar, blood pressure and cholesterol control.     Latest Ref Rng & Units 02/05/2022    8:32 AM 11/28/2021    4:07 PM 08/25/2021    8:25 AM 05/09/2021    3:58 PM 01/03/2021    6:50 AM  Diabetic Labs  HbA1c 0.0 - 7.0 %  8.2  10.0  7.3    Chol 0 - 200 mg/dL 118   107     HDL >40 mg/dL 31   27     Calc LDL 0 - 99 mg/dL 76   68     Triglycerides <150 mg/dL 55   62     Creatinine 0.44 - 1.00 mg/dL 1.02   1.07  1.16  1.10       02/07/2022    3:06 PM 02/07/2022    2:36 PM 11/28/2021    2:51 PM 11/02/2021    2:38 PM 08/29/2021    3:28 PM 08/15/2021    2:39 PM 07/18/2021    2:46 PM  BP/Weight  Systolic BP 644 034 742 595 638  756  Diastolic BP 88 88 82 82 81  84  Wt. (Lbs)  190.04 197 198 202.12 198 194.12  BMI  31.62 kg/m2 32.78 kg/m2 31.96 kg/m2 33.63 kg/m2 32.95 kg/m2 32.3 kg/m2      Latest Ref Rng & Units 11/30/2021   12:00 AM 07/18/2021    2:40 PM  Foot/eye exam completion dates  Eye Exam No Retinopathy No Retinopathy       Foot Form Completion   Done     This result is from an external source.

## 2022-02-08 NOTE — Progress Notes (Signed)
ZURISADAI HELMINIAK     MRN: [id number]      DOB: 01/04/1971   HPI Ms. Ruud is here for follow up and re-evaluation of chronic medical conditions, medication management and review of any available recent lab and radiology data.  Preventive health is updated, specifically  Cancer screening and Immunization.   Questions or concerns regarding consultations or procedures which the PT has had in the interim are  addressed. The PT denies any adverse reactions to current medications since the last visit.  C/o insonia despite 3 different meds has appt with Psych this pm Denies polyuria, polydipsia, blurred vision , or hypoglycemic episodes. FBG 140 to 150, tolerating medication well, initially had nausea but improved, no constipation  ROS Denies recent fever or chills. Denies sinus pressure, nasal congestion, ear pain or sore throat. Denies chest congestion, productive cough or wheezing. Denies chest pains, palpitations and leg swelling Denies abdominal pain, nausea, vomiting,diarrhea or constipation.   Denies dysuria, frequency, hesitancy or incontinence. Denies joint pain, swelling and limitation in mobility. Denies headaches, seizures, numbness, or tingling. Increased  anxiety and insomnia. Denies skin break down or rash.   PE  BP 130/88   Pulse 92   Ht '5\' 5"'$  (1.651 m)   Wt 190 lb 0.6 oz (86.2 kg)   SpO2 95%   BMI 31.62 kg/m   Patient alert and oriented and in no cardiopulmonary distress.  HEENT: No facial asymmetry, EOMI,     Neck supple .  Chest: Clear to auscultation bilaterally.  CVS: S1, S2 no murmurs, no S3.Regular rate.  ABD: Soft non tender.   Ext: No edema  MS: Adequate ROM spine, shoulders, hips and knees.  Skin: Intact, no ulcerations or rash noted.  Psych: Good eye contact,  anxious oand mildly depressed appearing.  CNS: CN 2-12 intact, power,  normal throughout.no focal deficits noted.   Assessment & Plan  Essential hypertension Not at goal,  no med change will f/u DASH diet and commitment to daily physical activity for a minimum of 30 minutes discussed and encouraged, as a part of hypertension management. The importance of attaining a healthy weight is also discussed.     02/07/2022    3:06 PM 02/07/2022    2:36 PM 11/28/2021    2:51 PM 11/02/2021    2:38 PM 08/29/2021    3:28 PM 08/15/2021    2:39 PM 07/18/2021    2:46 PM  BP/Weight  Systolic BP 355 732 202 542 706  237  Diastolic BP 88 88 82 82 81  84  Wt. (Lbs)  190.04 197 198 202.12 198 194.12  BMI  31.62 kg/m2 32.78 kg/m2 31.96 kg/m2 33.63 kg/m2 32.95 kg/m2 32.3 kg/m2       Type 2 diabetes mellitus with vascular disease (HCC) Uncontrolled , tolerating med well, increase dose and re eval in 2 months Ms. Flatt is reminded of the importance of commitment to daily physical activity for 30 minutes or more, as able and the need to limit carbohydrate intake to 30 to 60 grams per meal to help with blood sugar control.   The need to take medication as prescribed, test blood sugar as directed, and to call between visits if there is a concern that blood sugar is uncontrolled is also discussed.   Ms. Celia is reminded of the importance of daily foot exam, annual eye examination, and good blood sugar, blood pressure and cholesterol control.     Latest Ref Rng & Units 02/05/2022  8:32 AM 11/28/2021    4:07 PM 08/25/2021    8:25 AM 05/09/2021    3:58 PM 01/03/2021    6:50 AM  Diabetic Labs  HbA1c 0.0 - 7.0 %  8.2  10.0  7.3    Chol 0 - 200 mg/dL 118   107     HDL >40 mg/dL 31   27     Calc LDL 0 - 99 mg/dL 76   68     Triglycerides <150 mg/dL 55   62     Creatinine 0.44 - 1.00 mg/dL 1.02   1.07  1.16  1.10       02/07/2022    3:06 PM 02/07/2022    2:36 PM 11/28/2021    2:51 PM 11/02/2021    2:38 PM 08/29/2021    3:28 PM 08/15/2021    2:39 PM 07/18/2021    2:46 PM  BP/Weight  Systolic BP 962 836 629 476 546  503  Diastolic BP 88 88 82 82 81  84  Wt. (Lbs)  190.04 197  198 202.12 198 194.12  BMI  31.62 kg/m2 32.78 kg/m2 31.96 kg/m2 33.63 kg/m2 32.95 kg/m2 32.3 kg/m2      Latest Ref Rng & Units 11/30/2021   12:00 AM 07/18/2021    2:40 PM  Foot/eye exam completion dates  Eye Exam No Retinopathy No Retinopathy       Foot Form Completion   Done     This result is from an external source.        Obesity (BMI 30.0-34.9) Improved  Patient re-educated about  the importance of commitment to a  minimum of 150 minutes of exercise per week as able.  The importance of healthy food choices with portion control discussed, as well as eating regularly and within a 12 hour window most days. The need to choose "clean , green" food 50 to 75% of the time is discussed, as well as to make water the primary drink and set a goal of 64 ounces water daily.       02/07/2022    2:36 PM 11/28/2021    2:51 PM 11/02/2021    2:38 PM  Weight /BMI  Weight 190 lb 0.6 oz 197 lb 198 lb  Height '5\' 5"'$  (1.651 m) '5\' 5"'$  (1.651 m) '5\' 6"'$  (1.676 m)  BMI 31.62 kg/m2 32.78 kg/m2 31.96 kg/m2      Insomnia Not treated , pt having significant difficulty functioning , Psych to treat  Dyslipidemia (high LDL; low HDL) Hyperlipidemia:Low fat diet discussed and encouraged.   Lipid Panel  Lab Results  Component Value Date   CHOL 118 02/05/2022   HDL 31 (L) 02/05/2022   LDLCALC 76 02/05/2022   TRIG 55 02/05/2022   CHOLHDL 3.8 02/05/2022     Needs to inc exercise

## 2022-02-09 ENCOUNTER — Ambulatory Visit: Payer: No Typology Code available for payment source | Admitting: Cardiology

## 2022-02-19 ENCOUNTER — Ambulatory Visit (HOSPITAL_COMMUNITY): Payer: No Typology Code available for payment source | Admitting: Psychiatry

## 2022-02-27 NOTE — Progress Notes (Unsigned)
Virtual Visit via Video Note  I connected with Mary Roman on 02/28/22 at  4:30 PM EDT by a video enabled telemedicine application and verified that I am speaking with the correct person using two identifiers.  Location: Patient: home Provider: office Persons participated in the visit- patient, provider    I discussed the limitations of evaluation and management by telemedicine and the availability of in person appointments. The patient expressed understanding and agreed to proceed.    I discussed the assessment and treatment plan with the patient. The patient was provided an opportunity to ask questions and all were answered. The patient agreed with the plan and demonstrated an understanding of the instructions.   The patient was advised to call back or seek an in-person evaluation if the symptoms worsen or if the condition fails to improve as anticipated.  I provided 15 minutes of non-face-to-face time during this encounter.   Mary Clay, MD    [facility] MD/PA/NP OP Progress Note  02/28/2022 5:12 PM Mary Roman  MRN:  [id number]  Chief Complaint:  Chief Complaint  Patient presents with   Follow-up   Depression   HPI:  This is a follow-up appointment for depression, anxiety and insomnia.  She states that she is still unable to sleep.  She wakes up every hour.  She feels miserable at work.  Although she tries her best, it has been getting more difficult to work.  She has been trying to prepare her daughter to start school.  She thinks the communication with her children has been getting better.  Her husband has started work from today.  She feels good about this.  She does not think clonazepam has worked at all.  She states that she tried Xanax when she was in her 51s, and it worked very well.  She wants to try this medication instead of the other hypnotics, although she will hold the medication if it does not work.  Upon reviewing her medication, she states that she has  been taking medication consistently, although there may have been some a few days she may have missed to take.  She is willing to stay on the current medication regimen, and takes them consistently.  She feels down and anxious.  She denies panic attacks.  She denies hallucinations.  She denies SI.  She denies alcohol use or drug use.   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  one son, one 11 yo daughter   Visit Diagnosis:    ICD-10-CM   1. MDD (major depressive disorder), recurrent episode, moderate (HCC)  F33.1     2. GAD (generalized anxiety disorder)  F41.1     3. Insomnia, unspecified type  G47.00       Past Psychiatric History: Please see initial evaluation for full details. I have reviewed the history. No updates at this time.     Past Medical History:  Past Medical History:  Diagnosis Date   Absolute anemia 06/23/2018   Added automatically from request for surgery [id number]   Anemia    Anxiety    Backache 12/24/2007   Chronic back pain    Depression    Diabetes mellitus, type II ([place])    Headache(784.0)    Hypertension    Metabolic syndrome X 91/47/8295   Obesity    Palpitation    Sickle cell trait ([place])    Thyromegaly    Unspecified vitamin  D deficiency 02/19/2013    Past Surgical History:  Procedure Laterality Date   COLONOSCOPY WITH PROPOFOL N/A 01/03/2021   Procedure: COLONOSCOPY WITH PROPOFOL;  Surgeon: Harvel Quale, MD;  [facility];  Service: Gastroenterology;  Laterality: N/A;  AM   DILATATION AND CURETTAGE/HYSTEROSCOPY WITH MINERVA N/A 03/25/2019   Procedure: DILATATION AND CURETTAGE/HYSTEROSCOPY WITH  ATTEMPTED MINERVA ENDOMETRIAL ABLATION;  Surgeon: Florian Buff, MD;  Location: [facility] ORS;  Service: Gynecology;  Laterality: N/A;   None      Family Psychiatric History: Please see initial evaluation for full  details. I have reviewed the history. No updates at this time.     Family History:  Family History  Problem Relation Age of Onset   Diabetes Mother    Hypertension Mother    Stroke Mother    Colon cancer Mother    Alcohol abuse Mother    Cancer - Colon Mother 81   Pneumonia Father    Depression Sister    Hypertension Sister    Anxiety disorder Sister    Hypertension Sister    Leukemia Sister    Heart attack Maternal Uncle    Hypertension Maternal Grandfather    Diabetes Maternal Grandmother    Hypertension Maternal Grandmother    Depression Child     Social History:  Social History   Socioeconomic History   Marital status: Single    Spouse name: Not on file   Number of children: 2   Years of education: Not on file   Highest education level: Not on file  Occupational History   Occupation: employed in medical office   Tobacco Use   Smoking status: Never   Smokeless tobacco: Never  Vaping Use   Vaping Use: Never used  Substance and Sexual Activity   Alcohol use: No   Drug use: No   Sexual activity: Yes    Partners: Male    Birth control/protection: Other-see comments    Comment: bf had vasectomy  Other Topics Concern   Not on file  Social History Narrative   Not on file   Social Determinants of Health   Financial Resource Strain: Not on file  Food Insecurity: Not on file  Transportation Needs: Not on file  Physical Activity: Not on file  Stress: Not on file  Social Connections: Not on file    Allergies: No Known Allergies  Metabolic Disorder Labs: Lab Results  Component Value Date   HGBA1C 8.2 (A) 11/28/2021   MPG 240.3 08/25/2021   MPG 200.12 04/18/2020   No results found for: "PROLACTIN" Lab Results  Component Value Date   CHOL 118 02/05/2022   TRIG 55 02/05/2022   HDL 31 (L) 02/05/2022   CHOLHDL 3.8 02/05/2022   VLDL 11 02/05/2022   LDLCALC 76 02/05/2022   LDLCALC 68 08/25/2021   Lab Results  Component Value Date   TSH 1.564  08/25/2021   TSH 1.400 09/01/2020    Therapeutic Level Labs: No results found for: "LITHIUM" No results found for: "VALPROATE" No results found for: "CBMZ"  Current Medications: Current Outpatient Medications  Medication Sig Dispense Refill   ALPRAZolam (XANAX) 0.5 MG tablet Take 1 tablet (0.5 mg total) by mouth at bedtime as needed for anxiety or sleep. 30 tablet 1   acetaminophen (TYLENOL) 500 MG tablet Take 1,000 mg by mouth every 6 (six) hours as needed (pain).     aspirin EC 81 MG tablet Take 1 tablet (81 mg total) by mouth daily. (Patient taking differently:  Take 81 mg by mouth in the morning.) 90 tablet 3   benazepril (LOTENSIN) 5 MG tablet Take 1 tablet (5 mg total) by mouth daily. 90 tablet 3   blood glucose meter kit and supplies Dispense based on patient and insurance preference. Once daily testing dx e11.9 1 each 0   brexpiprazole (REXULTI) 1 MG TABS tablet Take 1 tablet (1 mg total) by mouth daily. 90 tablet 0   buPROPion (WELLBUTRIN XL) 150 MG 24 hr tablet Take 3 tablets (450 mg total) by mouth daily. 270 tablet 1   empagliflozin (JARDIANCE) 10 MG TABS tablet Take 1 tablet (10 mg total) by mouth daily before breakfast. 90 tablet 0   glipiZIDE (GLUCOTROL XL) 10 MG 24 hr tablet Take 1 tablet (10 mg total) by mouth daily with breakfast. 90 tablet 1   megestrol (MEGACE) 40 MG tablet Take 1 tablet (40 mg total) by mouth in the morning. 30 tablet 11   metoprolol tartrate (LOPRESSOR) 25 MG tablet Take 1 tablet (25 mg total) by mouth 2 (two) times daily. 180 tablet 3   potassium chloride SA (KLOR-CON M) 20 MEQ tablet Take 1 tablet (20 mEq total) by mouth daily. 30 tablet 3   rosuvastatin (CRESTOR) 5 MG tablet Take 1 tablet (5 mg total) by mouth daily. 90 tablet 3   tirzepatide (MOUNJARO) 5 MG/0.5ML Pen Inject 5 mg into the skin once a week. 6 mL 0   triamterene-hydrochlorothiazide (MAXZIDE-25) 37.5-25 MG tablet Take 1 tablet by mouth daily. 90 tablet 3   venlafaxine XR (EFFEXOR-XR)  150 MG 24 hr capsule Take 1 capsule (150 mg total) by mouth daily. Total of 225 mg daily. Take along with 75 mg cap 90 capsule 0   venlafaxine XR (EFFEXOR-XR) 75 MG 24 hr capsule Take 1 capsule (75 mg total) by mouth daily. Total of 225 mg daily. Take along with 150 mg cap 90 capsule 0   No current facility-administered medications for this visit.     Musculoskeletal: Strength & Muscle Tone:  N/A Gait & Station:  N/A Patient leans: N/A  Psychiatric Specialty Exam: Review of Systems  Psychiatric/Behavioral:  Positive for decreased concentration, dysphoric mood and sleep disturbance. Negative for agitation, behavioral problems, confusion, hallucinations, self-injury and suicidal ideas. The patient is nervous/anxious. The patient is not hyperactive.   All other systems reviewed and are negative.   There were no vitals taken for this visit.There is no height or weight on file to calculate BMI.  General Appearance: Fairly Groomed  Eye Contact:  Good  Speech:  Clear and Coherent  Volume:  Normal  Mood:   miserable  Affect:  Appropriate, Congruent, and down  Thought Process:  Coherent  Orientation:  Full (Time, Place, and Person)  Thought Content: Logical   Suicidal Thoughts:  No  Homicidal Thoughts:  No  Memory:  Immediate;   Good  Judgement:  Good  Insight:  Fair  Psychomotor Activity:  Normal  Concentration:  Concentration: Good and Attention Span: Good  Recall:  Good  Fund of Knowledge: Good  Language: Good  Akathisia:  No  Handed:  Right  AIMS (if indicated): not done  Assets:  Communication Skills Desire for Improvement  ADL's:  Intact  Cognition: WNL  Sleep:  Poor   Screenings: GAD-7    Flowsheet Row Office Visit from 02/07/2022 in [place] from 01/23/2022 in [facility] Counselor from 05/15/2018 in [place] Office Visit from 01/08/2017 in [facility]  Total GAD-7 Score _0 PHQ2-9    [place] Office Visit from 02/07/2022 in [facility] Counselor from 01/23/2022 in [place] Office Visit from 11/28/2021 in [facility] Counselor from 09/21/2021 in [facility] Video Visit from 09/14/2021 in [facility]  PHQ-2 Total Score _1 PHQ-9 Total Score _2 Flowsheet Row Video Visit from 09/14/2021 in [place] Video Visit from 08/10/2021 in [place] Video Visit from 05/04/2021 in [place] Error: Q3, 4, or 5 should not be populated when Q2 is No No Risk Low Risk        Assessment and Plan:  JACQUALYNN PARCO is a 51 y.o. year old female with a history of depression, mild OSA, diabetes, who presents for follow up appointment for below.   1. MDD (major depressive disorder), recurrent episode, moderate ([place]) 2. GAD (generalized anxiety disorder) She continues to report depressive symptoms since the last visit.  Psychosocial stressors includes financial strain, and her son with anxiety. Other psychosocial stressors includes conflict with her siblings around the care of her mother, who has cancer.  Will start Xanax as needed for anxiety given she reports significant benefit years ago.  Discussed potential risk of dependence, tolerance and oversedation.  She agrees to use this only for short-term/only when necessary.  Will discontinue clonazepam to avoid polypharmacy.  Will continue venlafaxine, bupropion and rexulti to target depression.  Noted that although she reports taking medication regularly, this does not match with the records from the pharmacy.  She agrees to take medication consistently moving forward.   3. Insomnia, unspecified type She has mild OSA, and  will be started on CPAP machine.  Will discontinue Belsomra due to limited benefit/over sedation.  She will be started on Xanax as needed as above.       Plan Continue venlafaxine 225 mg daily (75 mg filled on 2/28, 150 mg on 4/4) Continue bupropion 450 mg daily (270 tabs, filled on 05/2021) Continue rexulti 1 mg daily  (she is on metformin) Discontinue Belsomra, clonazepam Start Xanax 0.5 mg daily as needed for anxiety/insomnia Next appointment 9/6 at 4:30 for 30 mins, video (she declined in person visit due to work schedule) - on gabapentin for pain and hydroxyzine - on mounjouraro   Past trials of medication: fluoxetine, duloxetine, Abilify (weight gain),  buspirone, Trazodone ("weird" feeling) temazepam, Ambien, Lunesta, Xanax      The patient demonstrates the following risk factors for suicide: Chronic risk factors for suicide include: psychiatric disorder of depression, anxiety and chronic pain. Acute risk factors for suicide include: N/A. Protective factors for this patient include: positive social support, responsibility to others (children, family), coping skills and hope for the future. Considering these factors, the overall suicide risk at this point appears to be low. Patient is appropriate for outpatient follow up.          Collaboration of Care: Collaboration of Care: Other reviewed a note written by her PCP  Patient/Guardian was advised Release of Information must be obtained prior to any record release in order to collaborate their care with an outside provider. Patient/Guardian was advised if they have not already done so to contact the registration department to sign all necessary forms in order for [place] to release information regarding their  care.   Consent: Patient/Guardian gives verbal consent for treatment and assignment of benefits for services provided during this visit. Patient/Guardian expressed understanding and agreed to proceed.    Mary Clay, MD 02/28/2022,  5:12 PM

## 2022-02-28 ENCOUNTER — Encounter: Payer: Self-pay | Admitting: Psychiatry

## 2022-02-28 ENCOUNTER — Other Ambulatory Visit (HOSPITAL_COMMUNITY): Payer: Self-pay

## 2022-02-28 ENCOUNTER — Telehealth (INDEPENDENT_AMBULATORY_CARE_PROVIDER_SITE_OTHER): Payer: No Typology Code available for payment source | Admitting: Psychiatry

## 2022-02-28 DIAGNOSIS — G4700 Insomnia, unspecified: Secondary | ICD-10-CM

## 2022-02-28 DIAGNOSIS — F331 Major depressive disorder, recurrent, moderate: Secondary | ICD-10-CM | POA: Diagnosis not present

## 2022-02-28 DIAGNOSIS — F411 Generalized anxiety disorder: Secondary | ICD-10-CM

## 2022-02-28 MED ORDER — ALPRAZOLAM 0.5 MG PO TABS
0.5000 mg | ORAL_TABLET | Freq: Every evening | ORAL | 1 refills | Status: DC | PRN
Start: 2022-02-28 — End: 2022-03-10
  Filled 2022-02-28: qty 30, 30d supply, fill #0

## 2022-03-05 ENCOUNTER — Ambulatory Visit (HOSPITAL_COMMUNITY): Payer: No Typology Code available for payment source | Admitting: Psychiatry

## 2022-03-05 ENCOUNTER — Encounter (HOSPITAL_COMMUNITY): Payer: Self-pay

## 2022-03-08 ENCOUNTER — Other Ambulatory Visit (HOSPITAL_COMMUNITY)
Admission: RE | Admit: 2022-03-08 | Discharge: 2022-03-08 | Disposition: A | Discharge status: Home / Self Care | Payer: No Typology Code available for payment source | Source: Ambulatory Visit | Attending: Advanced Practice Midwife | Admitting: Advanced Practice Midwife

## 2022-03-08 ENCOUNTER — Encounter: Payer: Self-pay | Admitting: Advanced Practice Midwife

## 2022-03-08 ENCOUNTER — Ambulatory Visit (INDEPENDENT_AMBULATORY_CARE_PROVIDER_SITE_OTHER): Payer: No Typology Code available for payment source | Admitting: Advanced Practice Midwife

## 2022-03-08 VITALS — BP 104/74 | HR 86 | Ht 65.0 in | Wt 188.0 lb

## 2022-03-08 DIAGNOSIS — N912 Amenorrhea, unspecified: Secondary | ICD-10-CM | POA: Diagnosis not present

## 2022-03-08 DIAGNOSIS — Z01419 Encounter for gynecological examination (general) (routine) without abnormal findings: Secondary | ICD-10-CM

## 2022-03-08 NOTE — Progress Notes (Signed)
Mary Roman 52 y.o.  Vitals:   03/08/22 0836  BP: 104/74  Pulse: 86     Filed Weights   03/08/22 0836  Weight: 188 lb (85.3 kg)    Past Medical History: Past Medical History:  Diagnosis Date   Absolute anemia 06/23/2018   Added automatically from request for surgery [id number]   Anemia    Anxiety    Backache 12/24/2007   Chronic back pain    Depression    Diabetes mellitus, type II ([place])    Headache(784.0)    Hypertension    Metabolic syndrome X 76/72/0947   Obesity    Palpitation    Sickle cell trait ([place])    Thyromegaly    Unspecified vitamin D deficiency 02/19/2013    Past Surgical History: Past Surgical History:  Procedure Laterality Date   COLONOSCOPY WITH PROPOFOL N/A 01/03/2021   Procedure: COLONOSCOPY WITH PROPOFOL;  Surgeon: Harvel Quale, MD;  [facility];  Service: Gastroenterology;  Laterality: N/A;  AM   DILATATION AND CURETTAGE/HYSTEROSCOPY WITH MINERVA N/A 03/25/2019   Procedure: DILATATION AND CURETTAGE/HYSTEROSCOPY WITH  ATTEMPTED MINERVA ENDOMETRIAL ABLATION;  Surgeon: Florian Buff, MD;  Location: [facility];  Service: Gynecology;  Laterality: N/A;   None      Family History: Family History  Problem Relation Age of Onset   Diabetes Mother    Hypertension Mother    Stroke Mother    Colon cancer Mother    Alcohol abuse Mother    Cancer - Colon Mother 40   Pneumonia Father    Depression Sister    Hypertension Sister    Anxiety disorder Sister    Hypertension Sister    Leukemia Sister    Heart attack Maternal Uncle    Hypertension Maternal Grandfather    Diabetes Maternal Grandmother    Hypertension Maternal Grandmother    Depression Child     Social History: Social History   Tobacco Use   Smoking status: Never   Smokeless tobacco: Never  Vaping Use   Vaping Use: Never used  Substance Use Topics   Alcohol use: No   Drug use: No    Allergies: No Known Allergies    Current Outpatient Medications:     acetaminophen (TYLENOL) 500 MG tablet, Take 1,000 mg by mouth every 6 (six) hours as needed (pain)., Disp: , Rfl:    ALPRAZolam (XANAX) 0.5 MG tablet, Take 1 tablet (0.5 mg total) by mouth at bedtime as needed for anxiety or sleep., Disp: 30 tablet, Rfl: 1   aspirin EC 81 MG tablet, Take 1 tablet (81 mg total) by mouth daily. (Patient taking differently: Take 81 mg by mouth in the morning.), Disp: 90 tablet, Rfl: 3   benazepril (LOTENSIN) 5 MG tablet, Take 1 tablet (5 mg total) by mouth daily., Disp: 90 tablet, Rfl: 3   blood glucose meter kit and supplies, Dispense based on patient and insurance preference. Once daily testing dx e11.9, Disp: 1 each, Rfl: 0   empagliflozin (JARDIANCE) 10 MG TABS tablet, Take 1 tablet (10 mg total) by mouth daily before breakfast., Disp: 90 tablet, Rfl: 0   glipiZIDE (GLUCOTROL XL) 10 MG 24 hr tablet, Take 1 tablet (10 mg total) by mouth daily with breakfast., Disp: 90 tablet, Rfl: 1   megestrol (MEGACE) 40 MG tablet, Take 1 tablet (40 mg total) by mouth in the morning., Disp: 30 tablet, Rfl: 11   metoprolol tartrate (LOPRESSOR) 25 MG tablet, Take 1 tablet (25 mg total) by  mouth 2 (two) times daily., Disp: 180 tablet, Rfl: 3   potassium chloride SA (KLOR-CON M) 20 MEQ tablet, Take 1 tablet (20 mEq total) by mouth daily., Disp: 30 tablet, Rfl: 3   rosuvastatin (CRESTOR) 5 MG tablet, Take 1 tablet (5 mg total) by mouth daily., Disp: 90 tablet, Rfl: 3   tirzepatide (MOUNJARO) 5 MG/0.5ML Pen, Inject 5 mg into the skin once a week., Disp: 6 mL, Rfl: 0   triamterene-hydrochlorothiazide (MAXZIDE-25) 37.5-25 MG tablet, Take 1 tablet by mouth daily., Disp: 90 tablet, Rfl: 3   venlafaxine XR (EFFEXOR-XR) 150 MG 24 hr capsule, Take 1 capsule (150 mg total) by mouth daily. Total of 225 mg daily. Take along with 75 mg cap, Disp: 90 capsule, Rfl: 0   venlafaxine XR (EFFEXOR-XR) 75 MG 24 hr capsule, Take 1 capsule (75 mg total) by mouth daily. Total of 225 mg daily. Take along with  150 mg cap, Disp: 90 capsule, Rfl: 0   buPROPion (WELLBUTRIN XL) 150 MG 24 hr tablet, Take 3 tablets (450 mg total) by mouth daily., Disp: 270 tablet, Rfl: 1  History of Present Illness: Here for pap and physical. On megace since 2020 after a D&C for menonetrorrhagia.  On monjourno, lost 11 lbs.  Dr. Moshe Cipro watches labs, chronic conditions  Prior cytology:  Date Result Procedure  11/26/18 NILM w/ HRHPV negative None  05/09/14 NILM w/ HRHPV negative None    None         Review of Systems   Patient denies any headaches, blurred vision, shortness of breath, chest pain, abdominal pain, problems with bowel movements, urination, or intercourse.   Physical Exam: General:  Well developed, well nourished, no acute distress Skin:  Warm and dry Neck:  Midline trachea, normal thyroid Lungs; Clear to auscultation bilaterally Breast:  No dominant palpable mass, retraction, or nipple discharge Cardiovascular: Regular rate and rhythm Abdomen:  Soft, non tender, no hepatosplenomegaly Pelvic:  External genitalia is normal in appearance.  The vagina is normal in appearance.  The cervix is bulbous.  Uterus is felt to be normal size, shape, and contour.  No adnexal masses or tenderness noted.  Extremities:  No swelling or varicosities noted Psych:  No mood changes.     Impression: normal GYN exam     Plan: per pap results;  stop megace, order for [facility] in 1 month.  If >60, stop megace,  if low, consider restarting or just seeing what happens

## 2022-03-09 ENCOUNTER — Other Ambulatory Visit: Payer: Self-pay

## 2022-03-09 ENCOUNTER — Telehealth: Payer: Self-pay

## 2022-03-09 NOTE — Telephone Encounter (Signed)
pt called leftr a message that xanax .'5mg'$  is does not seem to be working. please advised if dosage needs to be increase or something else needs to be done.

## 2022-03-09 NOTE — Telephone Encounter (Signed)
pt was called and she states she is willing to try the sonata.

## 2022-03-09 NOTE — Telephone Encounter (Signed)
Ask if she is interested in [place] for insomnia.

## 2022-03-10 ENCOUNTER — Other Ambulatory Visit (HOSPITAL_COMMUNITY): Payer: Self-pay | Admitting: Psychiatry

## 2022-03-10 MED ORDER — ZALEPLON 5 MG PO CAPS
5.0000 mg | ORAL_CAPSULE | Freq: Every evening | ORAL | 0 refills | Status: DC | PRN
  Filled 2022-03-10: qty 60, 30d supply, fill #0

## 2022-03-10 NOTE — Telephone Encounter (Signed)
Ordered Sonata,5-10 mg at night as needed for insomnia. Side effects including drowsiness.  Please advise her to discontinue Xanax.

## 2022-03-12 ENCOUNTER — Other Ambulatory Visit: Payer: Self-pay | Admitting: Psychiatry

## 2022-03-12 ENCOUNTER — Other Ambulatory Visit (HOSPITAL_COMMUNITY): Payer: Self-pay

## 2022-03-12 LAB — CYTOLOGY - PAP
Comment: NEGATIVE
Diagnosis: NEGATIVE
High risk HPV: NEGATIVE

## 2022-03-12 MED ORDER — ZALEPLON 5 MG PO CAPS
5.0000 mg | ORAL_CAPSULE | Freq: Every evening | ORAL | 0 refills | Status: DC | PRN
  Filled 2022-03-12: qty 30, 30d supply, fill #0

## 2022-03-12 NOTE — Telephone Encounter (Signed)
spoke with patient and gave her information about stopping the xanax and that a rx was sent for '5mg'$  of sonata.

## 2022-03-14 ENCOUNTER — Other Ambulatory Visit (HOSPITAL_COMMUNITY): Payer: Self-pay

## 2022-03-19 ENCOUNTER — Encounter (HOSPITAL_COMMUNITY): Payer: Self-pay

## 2022-03-19 ENCOUNTER — Ambulatory Visit (HOSPITAL_COMMUNITY): Payer: No Typology Code available for payment source | Admitting: Psychiatry

## 2022-03-28 ENCOUNTER — Telehealth: Payer: Self-pay | Admitting: Family Medicine

## 2022-03-28 ENCOUNTER — Other Ambulatory Visit (HOSPITAL_COMMUNITY): Payer: Self-pay

## 2022-03-28 ENCOUNTER — Other Ambulatory Visit: Payer: Self-pay | Admitting: Family Medicine

## 2022-03-28 MED ORDER — ONDANSETRON HCL 4 MG PO TABS
4.0000 mg | ORAL_TABLET | Freq: Every day | ORAL | 0 refills | Status: DC
  Filled 2022-03-28: qty 20, 20d supply, fill #0

## 2022-03-28 NOTE — Telephone Encounter (Signed)
Error

## 2022-03-28 NOTE — Telephone Encounter (Signed)
Pt aware.

## 2022-03-28 NOTE — Telephone Encounter (Signed)
Message from Mary Roman------can you please ask Dr. Moshe Cipro if I could have something for nausea? She raised my dosage to 5 mg [facility]) and it seems to be making me nauseus. It's not constant but I had a bad day yesterday and wasn't able to eat anything. She can send it to [place]. Thanks

## 2022-03-29 ENCOUNTER — Ambulatory Visit: Payer: No Typology Code available for payment source | Admitting: Cardiology

## 2022-04-03 NOTE — Progress Notes (Unsigned)
Virtual Visit via Video Note  I connected with Mary Roman on 04/04/22 at  4:30 PM EDT by a video enabled telemedicine application and verified that I am speaking with the correct person using two identifiers.  Location: Patient: home Provider: office Persons participated in the visit- patient, provider    I discussed the limitations of evaluation and management by telemedicine and the availability of in person appointments. The patient expressed understanding and agreed to proceed.     I discussed the assessment and treatment plan with the patient. The patient was provided an opportunity to ask questions and all were answered. The patient agreed with the plan and demonstrated an understanding of the instructions.   The patient was advised to call back or seek an in-person evaluation if the symptoms worsen or if the condition fails to improve as anticipated.  I provided 11 minutes of non-face-to-face time during this encounter.   Mary Clay, MD    [facility] MD/PA/NP OP Progress Note  04/04/2022 5:13 PM Mary Roman  MRN:  [id number]  Chief Complaint:  Chief Complaint  Patient presents with   Depression   Follow-up   HPI:  - xanax was switched to sonata This is a follow-up appointment for depression and insomnia.  She states that she has been doing a little better.  She has been able to sleep 4 hours with Sonata 10 mg without side effect.  She is able to feel rested.  She has been more hectic as her children are back to school.  Her fianc is working.  He also has better relationship with the children.  They are planning to move in the same town to have a larger place.  She feels positive about this.  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  She has been able to go to work regularly.  She has decrease in appetite since being on the medication for diabetes.  She denies SI.  She feels anxious and tense at times.  She misses to take medication on  weekends, although she has been able to take it consistently during the weekday.  She is willing to improve adherence, and continue the current medication.   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  one son, one 50 yo daughter  Visit Diagnosis:    ICD-10-CM   1. MDD (major depressive disorder), recurrent episode, moderate (HCC)  F33.1     2. GAD (generalized anxiety disorder)  F41.1     3. Insomnia, unspecified type  G47.00       Past Psychiatric History: Please see initial evaluation for full details. I have reviewed the history. No updates at this time.     Past Medical History:  Past Medical History:  Diagnosis Date   Absolute anemia 06/23/2018   Added automatically from request for surgery [id number]   Anemia    Anxiety    Backache 12/24/2007   Chronic back pain    Depression    Diabetes mellitus, type II ([place])    Headache(784.0)    Hypertension    Metabolic syndrome X 41/58/3094   Obesity    Palpitation    Sickle cell trait ([place])    Thyromegaly    Unspecified vitamin D deficiency 02/19/2013    Past Surgical History:  Procedure Laterality Date   COLONOSCOPY WITH PROPOFOL N/A 01/03/2021   Procedure: COLONOSCOPY WITH PROPOFOL;  Surgeon: Jenetta Downer  Roney Marion, MD;  Location: [facility];  Service: Gastroenterology;  Laterality: N/A;  AM   DILATATION AND CURETTAGE/HYSTEROSCOPY WITH MINERVA N/A 03/25/2019   Procedure: DILATATION AND CURETTAGE/HYSTEROSCOPY WITH  ATTEMPTED MINERVA ENDOMETRIAL ABLATION;  Surgeon: Florian Buff, MD;  Location: [facility] ORS;  Service: Gynecology;  Laterality: N/A;   None      Family Psychiatric History: Please see initial evaluation for full details. I have reviewed the history. No updates at this time.     Family History:  Family History  Problem Relation Age of Onset   Diabetes Mother    Hypertension Mother     Stroke Mother    Colon cancer Mother    Alcohol abuse Mother    Cancer - Colon Mother 17   Pneumonia Father    Depression Sister    Hypertension Sister    Anxiety disorder Sister    Hypertension Sister    Leukemia Sister    Heart attack Maternal Uncle    Hypertension Maternal Grandfather    Diabetes Maternal Grandmother    Hypertension Maternal Grandmother    Depression Child     Social History:  Social History   Socioeconomic History   Marital status: Single    Spouse name: Not on file   Number of children: 2   Years of education: Not on file   Highest education level: Not on file  Occupational History   Occupation: employed in medical office   Tobacco Use   Smoking status: Never   Smokeless tobacco: Never  Vaping Use   Vaping Use: Never used  Substance and Sexual Activity   Alcohol use: No   Drug use: No   Sexual activity: Yes    Partners: Male    Birth control/protection: Other-see comments    Comment: bf had vasectomy  Other Topics Concern   Not on file  Social History Narrative   Not on file   Social Determinants of Health   Financial Resource Strain: Medium Risk (03/08/2022)   Overall Financial Resource Strain (CARDIA)    Difficulty of Paying Living Expenses: Somewhat hard  Food Insecurity: Unknown (03/08/2022)   Hunger Vital Sign    Worried About Running Out of Food in the Last Year: Patient refused    Ran Out of Food in the Last Year: Patient refused  Transportation Needs: No Transportation Needs (03/08/2022)   PRAPARE - Hydrologist (Medical): No    Lack of Transportation (Non-Medical): No  Physical Activity: Inactive (03/08/2022)   Exercise Vital Sign    Days of Exercise per Week: 0 days    Minutes of Exercise per Session: 0 min  Stress: Stress Concern Present (03/08/2022)   [place]    Feeling of Stress : Very much  Social Connections: Moderately  Isolated (03/08/2022)   Social Connection and Isolation Panel [NHANES]    Frequency of Communication with Friends and Family: Twice a week    Frequency of Social Gatherings with Friends and Family: Once a week    Attends Religious Services: Never    Marine scientist or Organizations: No    Attends Archivist Meetings: Never    Marital Status: Living with partner    Allergies: No Known Allergies  Metabolic Disorder Labs: Lab Results  Component Value Date   HGBA1C 8.2 (A) 11/28/2021   MPG 240.3 08/25/2021   MPG 200.12 04/18/2020   No results found for: "PROLACTIN" Lab Results  Component Value Date   CHOL 118 02/05/2022   TRIG 55 02/05/2022   HDL 31 (L) 02/05/2022   CHOLHDL 3.8 02/05/2022   VLDL 11 02/05/2022   LDLCALC 76 02/05/2022   LDLCALC 68 08/25/2021   Lab Results  Component Value Date   TSH 1.564 08/25/2021   TSH 1.400 09/01/2020    Therapeutic Level Labs: No results found for: "LITHIUM" No results found for: "VALPROATE" No results found for: "CBMZ"  Current Medications: Current Outpatient Medications  Medication Sig Dispense Refill   acetaminophen (TYLENOL) 500 MG tablet Take 1,000 mg by mouth every 6 (six) hours as needed (pain).     aspirin EC 81 MG tablet Take 1 tablet (81 mg total) by mouth daily. (Patient taking differently: Take 81 mg by mouth in the morning.) 90 tablet 3   benazepril (LOTENSIN) 5 MG tablet Take 1 tablet (5 mg total) by mouth daily. 90 tablet 3   blood glucose meter kit and supplies Dispense based on patient and insurance preference. Once daily testing dx e11.9 1 each 0   buPROPion (WELLBUTRIN XL) 150 MG 24 hr tablet Take 3 tablets (450 mg total) by mouth daily. 270 tablet 1   empagliflozin (JARDIANCE) 10 MG TABS tablet Take 1 tablet (10 mg total) by mouth daily before breakfast. 90 tablet 0   glipiZIDE (GLUCOTROL XL) 10 MG 24 hr tablet Take 1 tablet (10 mg total) by mouth daily with breakfast. 90 tablet 1   megestrol  (MEGACE) 40 MG tablet Take 1 tablet (40 mg total) by mouth in the morning. 30 tablet 11   metoprolol tartrate (LOPRESSOR) 25 MG tablet Take 1 tablet (25 mg total) by mouth 2 (two) times daily. 180 tablet 3   ondansetron (ZOFRAN) 4 MG tablet Take 1 tablet (4 mg total) by mouth daily if needed for nausea 20 tablet 0   potassium chloride SA (KLOR-CON M) 20 MEQ tablet Take 1 tablet (20 mEq total) by mouth daily. 30 tablet 3   rosuvastatin (CRESTOR) 5 MG tablet Take 1 tablet (5 mg total) by mouth daily. 90 tablet 3   tirzepatide (MOUNJARO) 5 MG/0.5ML Pen Inject 5 mg into the skin once a week. 6 mL 0   triamterene-hydrochlorothiazide (MAXZIDE-25) 37.5-25 MG tablet Take 1 tablet by mouth daily. 90 tablet 3   venlafaxine XR (EFFEXOR-XR) 150 MG 24 hr capsule Take 1 capsule (150 mg total) by mouth daily. Total of 225 mg daily. Take along with 75 mg cap 90 capsule 0   venlafaxine XR (EFFEXOR-XR) 75 MG 24 hr capsule Take 1 capsule (75 mg total) by mouth daily. Total of 225 mg daily. Take along with 150 mg cap 90 capsule 0   zaleplon (SONATA) 10 MG capsule Take 1 capsule (10 mg total) by mouth at bedtime as needed for sleep. 30 capsule 1   No current facility-administered medications for this visit.     Musculoskeletal: Strength & Muscle Tone:  N/A Gait & Station:  N/A Patient leans: N/A  Psychiatric Specialty Exam: Review of Systems  Psychiatric/Behavioral:  Positive for dysphoric mood and sleep disturbance. Negative for agitation, behavioral problems, confusion, decreased concentration, hallucinations, self-injury and suicidal ideas. The patient is nervous/anxious. The patient is not hyperactive.   All other systems reviewed and are negative.   There were no vitals taken for this visit.There is no height or weight on file to calculate BMI.  General Appearance: Fairly Groomed  Eye Contact:  Good  Speech:  Clear and Coherent  Volume:  Normal  Mood:   better  Affect:  Appropriate, Congruent, and  calm  Thought Process:  Coherent  Orientation:  Full (Time, Place, and Person)  Thought Content: Logical   Suicidal Thoughts:  No  Homicidal Thoughts:  No  Memory:  Immediate;   Good  Judgement:  Good  Insight:  Good  Psychomotor Activity:  Normal  Concentration:  Concentration: Good and Attention Span: Good  Recall:  Good  Fund of Knowledge: Good  Language: Good  Akathisia:  No  Handed:  Right  AIMS (if indicated): not done  Assets:  Communication Skills Desire for Improvement  ADL's:  Intact  Cognition: WNL  Sleep:  Fair   Screenings: GAD-7    Flowsheet Row Office Visit from 03/08/2022 in [place] Office Visit from 02/07/2022 in [place] from 01/23/2022 in [place] from 05/15/2018 in [place] Office Visit from 01/08/2017 in [facility]  Total GAD-7 Score 16 12 15 17 14       PHQ2-9    [place] Visit from 03/08/2022 in [facility] Office Visit from 02/07/2022 in [place] from 01/23/2022 in [place] Office Visit from 11/28/2021 in [facility] Counselor from 09/21/2021 in [facility]  PHQ-2 Total Score 6 2 5 2 3   PHQ-9 Total Score 14 11 18 6 10       Flowsheet Row Video Visit from 09/14/2021 in [place] Video Visit from 08/10/2021 in [place] Video Visit from 05/04/2021 in [place] Error: Q3, 4, or 5 should not be populated when Q2 is No No Risk Low Risk        Assessment and Plan:  SHATON LORE is a 51 y.o. year old female with a history of depression, mild OSA on CPAP, diabetes, who presents for follow up appointment for below.   1. MDD (major depressive  disorder), recurrent episode, moderate ([place]) 2. GAD (generalized anxiety disorder) There has been overall improvement since the improvement in her insomnia.  This also coincided with her fianc, who has started to work.  Psychosocial stressors includes financial strain, and her son with anxiety. Other psychosocial stressors includes conflict with her siblings around the care of her mother, who has cancer.  There is still concern of medication adherence, although it has been improving.  She is willing to take medication consistently.  Will continue venlafaxine, bupropion to target depression.  Will continue rexulti adjunctive treatment for depression.  Will discontinue Xanax at this time given it had limited benefit.   3. Insomnia, unspecified type Improving since starting Sonata.  Will continue current dose to target insomnia.    Plan Continue venlafaxine 225 mg daily - 150 mg was filled on April 4th for 90 days  Continue bupropion 450 mg daily  Continue rexulti 1 mg daily  (she is on metformin)- she will contact the office if she needs a refill Discontinue Xanax Continue Sonata 10 mg at night as needed for insomnia Next appointment 10/18 at 4:30 for 30 mins, video (she declined in person visit due to work schedule) - on gabapentin for pain and hydroxyzine - on mounjouraro   Past trials of medication: fluoxetine, duloxetine, Abilify (weight gain),  buspirone, Trazodone ("weird" feeling) temazepam, Ambien, Lunesta, Xanax      The patient demonstrates the following risk factors for suicide: Chronic risk factors  for suicide include: psychiatric disorder of depression, anxiety and chronic pain. Acute risk factors for suicide include: N/A. Protective factors for this patient include: positive social support, responsibility to others (children, family), coping skills and hope for the future. Considering these factors, the overall suicide risk at this point appears to be low. Patient is appropriate for  outpatient follow up.          Collaboration of Care: Collaboration of Care: Other N/A  Patient/Guardian was advised Release of Information must be obtained prior to any record release in order to collaborate their care with an outside provider. Patient/Guardian was advised if they have not already done so to contact the registration department to sign all necessary forms in order for [place] to release information regarding their care.   Consent: Patient/Guardian gives verbal consent for treatment and assignment of benefits for services provided during this visit. Patient/Guardian expressed understanding and agreed to proceed.    Mary Clay, MD 04/04/2022, 5:13 PM

## 2022-04-04 ENCOUNTER — Telehealth (INDEPENDENT_AMBULATORY_CARE_PROVIDER_SITE_OTHER): Payer: No Typology Code available for payment source | Admitting: Psychiatry

## 2022-04-04 ENCOUNTER — Other Ambulatory Visit (HOSPITAL_COMMUNITY): Payer: Self-pay

## 2022-04-04 ENCOUNTER — Encounter: Payer: Self-pay | Admitting: Psychiatry

## 2022-04-04 DIAGNOSIS — G4700 Insomnia, unspecified: Secondary | ICD-10-CM

## 2022-04-04 DIAGNOSIS — F411 Generalized anxiety disorder: Secondary | ICD-10-CM

## 2022-04-04 DIAGNOSIS — F331 Major depressive disorder, recurrent, moderate: Secondary | ICD-10-CM

## 2022-04-04 MED ORDER — ZALEPLON 10 MG PO CAPS
10.0000 mg | ORAL_CAPSULE | Freq: Every evening | ORAL | 1 refills | Status: DC | PRN
  Filled 2022-04-04: qty 30, 30d supply, fill #0
  Filled 2022-05-14: qty 30, 30d supply, fill #1

## 2022-04-05 ENCOUNTER — Other Ambulatory Visit (HOSPITAL_COMMUNITY): Payer: Self-pay

## 2022-04-10 ENCOUNTER — Other Ambulatory Visit: Payer: Self-pay | Admitting: Family Medicine

## 2022-04-10 ENCOUNTER — Other Ambulatory Visit (HOSPITAL_COMMUNITY)
Admission: RE | Admit: 2022-04-10 | Discharge: 2022-04-10 | Disposition: A | Discharge status: Home / Self Care | Payer: No Typology Code available for payment source | Source: Ambulatory Visit | Attending: Advanced Practice Midwife | Admitting: Advanced Practice Midwife

## 2022-04-10 ENCOUNTER — Other Ambulatory Visit (HOSPITAL_COMMUNITY)
Admission: RE | Admit: 2022-04-10 | Discharge: 2022-04-10 | Disposition: A | Discharge status: Home / Self Care | Payer: No Typology Code available for payment source | Source: Ambulatory Visit | Attending: Family Medicine | Admitting: Family Medicine

## 2022-04-10 DIAGNOSIS — E559 Vitamin D deficiency, unspecified: Secondary | ICD-10-CM | POA: Diagnosis present

## 2022-04-10 DIAGNOSIS — E1159 Type 2 diabetes mellitus with other circulatory complications: Secondary | ICD-10-CM | POA: Diagnosis present

## 2022-04-10 DIAGNOSIS — N912 Amenorrhea, unspecified: Secondary | ICD-10-CM | POA: Insufficient documentation

## 2022-04-10 LAB — BASIC METABOLIC PANEL
Anion gap: 10 (ref 5–15)
BUN: 12 mg/dL (ref 6–20)
CO2: 26 mmol/L (ref 22–32)
Calcium: 9.2 mg/dL (ref 8.9–10.3)
Chloride: 104 mmol/L (ref 98–111)
Creatinine, Ser: 1.08 mg/dL — ABNORMAL HIGH (ref 0.44–1.00)
GFR, Estimated: >60 mL/min (ref >60)
Glucose, Bld: 232 mg/dL — ABNORMAL HIGH (ref 70–99)
Potassium: 3.5 mmol/L (ref 3.5–5.1)
Sodium: 140 mmol/L (ref 135–145)

## 2022-04-10 LAB — HEMOGLOBIN A1C
Hgb A1c MFr Bld: 7.7 % — ABNORMAL HIGH (ref 4.8–5.6)
Mean Plasma Glucose: 174.29 mg/dL

## 2022-04-10 LAB — VITAMIN D 25 HYDROXY (VIT D DEFICIENCY, FRACTURES): Vit D, 25-Hydroxy: 60.45 ng/mL (ref 30–100)

## 2022-04-11 LAB — FOLLICLE STIMULATING HORMONE: FSH: 10.2 m[IU]/mL

## 2022-04-12 ENCOUNTER — Ambulatory Visit (INDEPENDENT_AMBULATORY_CARE_PROVIDER_SITE_OTHER): Payer: No Typology Code available for payment source | Admitting: Family Medicine

## 2022-04-12 ENCOUNTER — Encounter: Payer: Self-pay | Admitting: Family Medicine

## 2022-04-12 ENCOUNTER — Encounter: Payer: Self-pay | Admitting: Advanced Practice Midwife

## 2022-04-12 ENCOUNTER — Other Ambulatory Visit (HOSPITAL_COMMUNITY): Payer: Self-pay

## 2022-04-12 VITALS — BP 101/65 | HR 92 | Resp 16 | Ht 65.0 in | Wt 191.0 lb

## 2022-04-12 DIAGNOSIS — E785 Hyperlipidemia, unspecified: Secondary | ICD-10-CM

## 2022-04-12 DIAGNOSIS — F5104 Psychophysiologic insomnia: Secondary | ICD-10-CM | POA: Diagnosis not present

## 2022-04-12 DIAGNOSIS — E669 Obesity, unspecified: Secondary | ICD-10-CM | POA: Diagnosis not present

## 2022-04-12 DIAGNOSIS — I10 Essential (primary) hypertension: Secondary | ICD-10-CM | POA: Diagnosis not present

## 2022-04-12 DIAGNOSIS — E1159 Type 2 diabetes mellitus with other circulatory complications: Secondary | ICD-10-CM

## 2022-04-12 MED ORDER — TIRZEPATIDE 7.5 MG/0.5ML ~~LOC~~ SOAJ
7.5000 mg | SUBCUTANEOUS | 1 refills | Status: DC
  Filled 2022-04-12: qty 6, 84d supply, fill #0

## 2022-04-12 MED ORDER — ALPRAZOLAM 0.5 MG PO TABS: 0.5000 mg | ORAL_TABLET | Freq: Every evening | ORAL | 0 refills | Status: DC | PRN

## 2022-04-12 MED ORDER — TRIAMTERENE-HCTZ 37.5-25 MG PO TABS
0.5000 | ORAL_TABLET | Freq: Every day | ORAL | 3 refills | Status: DC
  Filled 2022-04-12: qty 45, fill #0

## 2022-04-12 NOTE — Patient Instructions (Signed)
F/u in6 weeks , re evaluate blood pressure and blood sugar , also shingrix #2 and TdAP, call if you need me sooner  Limited xanax sent to [facility] due to increased stress of Mother's illnes  Congrats on improved blood sugar, and higher dose mounjaro to be started  BP is Low , take HALF triamterene daily, stop taking a whole tablet  Microalb today  Thanks for choosing [facility], we consider it a privelige to serve you.

## 2022-04-13 ENCOUNTER — Telehealth: Payer: Self-pay | Admitting: Family Medicine

## 2022-04-13 ENCOUNTER — Encounter: Payer: Self-pay | Admitting: Family Medicine

## 2022-04-13 MED ORDER — ALPRAZOLAM 1 MG PO TABS: ORAL_TABLET | ORAL | 0 refills | Status: DC

## 2022-04-13 NOTE — Telephone Encounter (Signed)
Said she thought you were going to send in a temp increased dose of xanax due to her situation but when she went to pick it up it was still the 0.5. Please advise

## 2022-04-13 NOTE — Progress Notes (Unsigned)
Mary Roman     MRN: [id number]      DOB: July 29, 1971   HPI Mary Roman is here for follow up and re-evaluation of chronic medical conditions, medication management and review of any available recent lab and radiology data.  Preventive health is updated, specifically  Cancer screening and Immunization.   Questions or concerns regarding consultations or procedures which the PT has had in the interim are  addressed. The PT denies any adverse reactions to current medications since the last visit.  Increased stress and anxiety with poor sleep mother in ICU with guarded prognosis. Syates the xanax dose prescribed by dr Modesta Messing did not help and she has no refills, asking for short term coverage , in light of circumstances I have agreed and will communicate with her Psych  Denies polyuria, polydipsia, blurred vision , or hypoglycemic episodes.   ROS Denies recent fever or chills. Denies sinus pressure, nasal congestion, ear pain or sore throat. Denies chest congestion, productive cough or wheezing. Denies chest pains, palpitations and leg swelling Denies abdominal pain, nausea, vomiting,diarrhea or constipation.   Denies dysuria, frequency, hesitancy or incontinence. Denies joint pain, swelling and limitation in mobility. Denies headaches, seizures, numbness, or tingling.  Denies skin break down or rash.   PE  BP 101/65   Pulse 92   Resp 16   Ht '5\' 5"'$  (1.651 m)   Wt 191 lb (86.6 kg)   SpO2 95%   BMI 31.78 kg/m   Patient alert and oriented and in no cardiopulmonary distress.  HEENT: No facial asymmetry, EOMI,     Neck supple .  Chest: Clear to auscultation bilaterally.  CVS: S1, S2 no murmurs, no S3.Regular rate.  ABD: Soft non tender.   Ext: No edema  MS: Adequate ROM spine, shoulders, hips and knees.  Skin: Intact, no ulcerations or rash noted.  Psych:Fair  eye contact, anxious. Memory intact CNS: CN 2-12 intact, power,  normal throughout.no focal deficits  noted.   Assessment & Plan  Type 2 diabetes mellitus with vascular disease ([place]) Mary Roman is reminded of the importance of commitment to daily physical activity for 30 minutes or more, as able and the need to limit carbohydrate intake to 30 to 60 grams per meal to help with blood sugar control.   The need to take medication as prescribed, test blood sugar as directed, and to call between visits if there is a concern that blood sugar is uncontrolled is also discussed.   Mary Roman is reminded of the importance of daily foot exam, annual eye examination, and good blood sugar, blood pressure and cholesterol control.     Latest Ref Rng & Units 04/12/2022    3:16 PM 04/10/2022    8:33 AM 02/05/2022    8:32 AM 11/28/2021    4:07 PM 08/25/2021    8:25 AM  Diabetic Labs  HbA1c 4.8 - 5.6 %  7.7   8.2  10.0   Micro/Creat Ratio 0 - 29 mg/g creat 5       Chol 0 - 200 mg/dL   118   107   HDL >40 mg/dL   31   27   Calc LDL 0 - 99 mg/dL   76   68   Triglycerides <150 mg/dL   55   62   Creatinine 0.44 - 1.00 mg/dL  1.08  1.02   1.07       04/12/2022    2:32 PM 03/08/2022    8:36 AM  02/07/2022    3:06 PM 02/07/2022    2:36 PM 11/28/2021    2:51 PM 11/02/2021    2:38 PM 08/29/2021    3:28 PM  BP/Weight  Systolic BP 093 267 124 580 998 338 250  Diastolic BP 65 74 88 88 82 82 81  Wt. (Lbs) 191 188  190.04 197 198 202.12  BMI 31.78 kg/m2 31.28 kg/m2  31.62 kg/m2 32.78 kg/m2 31.96 kg/m2 33.63 kg/m2      Latest Ref Rng & Units 11/30/2021   12:00 AM 07/18/2021    2:40 PM  Foot/eye exam completion dates  Eye Exam No Retinopathy No Retinopathy       Foot Form Completion   Done     This result is from an external source.      Improved, inc med dose  Obesity (BMI 30.0-34.9)  Patient re-educated about  the importance of commitment to a  minimum of 150 minutes of exercise per week as able.  The importance of healthy food choices with portion control discussed, as well as eating regularly and  within a 12 hour window most days. The need to choose "clean , green" food 50 to 75% of the time is discussed, as well as to make water the primary drink and set a goal of 64 ounces water daily.       04/12/2022    2:32 PM 03/08/2022    8:36 AM 02/07/2022    2:36 PM  Weight /BMI  Weight 191 lb 188 lb 190 lb 0.6 oz  Height '5\' 5"'$  (1.651 m) '5\' 5"'$  (1.651 m) '5\' 5"'$  (1.651 m)  BMI 31.78 kg/m2 31.28 kg/m2 31.62 kg/m2      Insomnia Increased anxiety  And stress mother currently in ICU, short course of xanax prescribed will message Psych also to be nmotified  Dyslipidemia (high LDL; low HDL) Hyperlipidemia:Low fat diet discussed and encouraged.   Lipid Panel  Lab Results  Component Value Date   CHOL 118 02/05/2022   HDL 31 (L) 02/05/2022   LDLCALC 76 02/05/2022   TRIG 55 02/05/2022   CHOLHDL 3.8 02/05/2022     Needs to execise  Essential hypertension Overcorrected, reduce maxzide dose.ash DASH diet and commitment to daily physical activity for a minimum of 30 minutes discussed and encouraged, as a part of hypertension management. The importance of attaining a healthy weight is also discussed.     04/12/2022    2:32 PM 03/08/2022    8:36 AM 02/07/2022    3:06 PM 02/07/2022    2:36 PM 11/28/2021    2:51 PM 11/02/2021    2:38 PM 08/29/2021    3:28 PM  BP/Weight  Systolic BP 539 767 341 937 902 409 735  Diastolic BP 65 74 88 88 82 82 81  Wt. (Lbs) 191 188  190.04 197 198 202.12  BMI 31.78 kg/m2 31.28 kg/m2  31.62 kg/m2 32.78 kg/m2 31.96 kg/m2 33.63 kg/m2

## 2022-04-14 LAB — MICROALBUMIN / CREATININE URINE RATIO
Creatinine, Urine: 93.7 mg/dL
Microalb/Creat Ratio: 5 mg/g creat (ref 0–29)
Microalbumin, Urine: 5 ug/mL

## 2022-04-15 NOTE — Assessment & Plan Note (Signed)
Mary Roman is reminded of the importance of commitment to daily physical activity for 30 minutes or more, as able and the need to limit carbohydrate intake to 30 to 60 grams per meal to help with blood sugar control.   The need to take medication as prescribed, test blood sugar as directed, and to call between visits if there is a concern that blood sugar is uncontrolled is also discussed.   Mary Roman is reminded of the importance of daily foot exam, annual eye examination, and good blood sugar, blood pressure and cholesterol control.     Latest Ref Rng & Units 04/12/2022    3:16 PM 04/10/2022    8:33 AM 02/05/2022    8:32 AM 11/28/2021    4:07 PM 08/25/2021    8:25 AM  Diabetic Labs  HbA1c 4.8 - 5.6 %  7.7   8.2  10.0   Micro/Creat Ratio 0 - 29 mg/g creat 5       Chol 0 - 200 mg/dL   118   107   HDL >40 mg/dL   31   27   Calc LDL 0 - 99 mg/dL   76   68   Triglycerides <150 mg/dL   55   62   Creatinine 0.44 - 1.00 mg/dL  1.08  1.02   1.07       04/12/2022    2:32 PM 03/08/2022    8:36 AM 02/07/2022    3:06 PM 02/07/2022    2:36 PM 11/28/2021    2:51 PM 11/02/2021    2:38 PM 08/29/2021    3:28 PM  BP/Weight  Systolic BP 469 629 528 413 244 010 272  Diastolic BP 65 74 88 88 82 82 81  Wt. (Lbs) 191 188  190.04 197 198 202.12  BMI 31.78 kg/m2 31.28 kg/m2  31.62 kg/m2 32.78 kg/m2 31.96 kg/m2 33.63 kg/m2      Latest Ref Rng & Units 11/30/2021   12:00 AM 07/18/2021    2:40 PM  Foot/eye exam completion dates  Eye Exam No Retinopathy No Retinopathy       Foot Form Completion   Done     This result is from an external source.      Improved, inc med dose

## 2022-04-15 NOTE — Assessment & Plan Note (Signed)
Increased anxiety  And stress mother currently in ICU, short course of xanax prescribed will message Psych also to be nmotified

## 2022-04-15 NOTE — Assessment & Plan Note (Signed)
  Patient re-educated about  the importance of commitment to a  minimum of 150 minutes of exercise per week as able.  The importance of healthy food choices with portion control discussed, as well as eating regularly and within a 12 hour window most days. The need to choose "clean , green" food 50 to 75% of the time is discussed, as well as to make water the primary drink and set a goal of 64 ounces water daily.       04/12/2022    2:32 PM 03/08/2022    8:36 AM 02/07/2022    2:36 PM  Weight /BMI  Weight 191 lb 188 lb 190 lb 0.6 oz  Height '5\' 5"'$  (1.651 m) '5\' 5"'$  (1.651 m) '5\' 5"'$  (1.651 m)  BMI 31.78 kg/m2 31.28 kg/m2 31.62 kg/m2

## 2022-04-15 NOTE — Assessment & Plan Note (Signed)
Hyperlipidemia:Low fat diet discussed and encouraged.   Lipid Panel  Lab Results  Component Value Date   CHOL 118 02/05/2022   HDL 31 (L) 02/05/2022   LDLCALC 76 02/05/2022   TRIG 55 02/05/2022   CHOLHDL 3.8 02/05/2022     Needs to execise

## 2022-04-15 NOTE — Assessment & Plan Note (Signed)
Overcorrected, reduce maxzide dose.ash DASH diet and commitment to daily physical activity for a minimum of 30 minutes discussed and encouraged, as a part of hypertension management. The importance of attaining a healthy weight is also discussed.     04/12/2022    2:32 PM 03/08/2022    8:36 AM 02/07/2022    3:06 PM 02/07/2022    2:36 PM 11/28/2021    2:51 PM 11/02/2021    2:38 PM 08/29/2021    3:28 PM  BP/Weight  Systolic BP 299 242 683 419 622 297 989  Diastolic BP 65 74 88 88 82 82 81  Wt. (Lbs) 191 188  190.04 197 198 202.12  BMI 31.78 kg/m2 31.28 kg/m2  31.62 kg/m2 32.78 kg/m2 31.96 kg/m2 33.63 kg/m2

## 2022-04-16 ENCOUNTER — Other Ambulatory Visit (HOSPITAL_COMMUNITY): Payer: Self-pay

## 2022-04-16 ENCOUNTER — Other Ambulatory Visit: Payer: Self-pay | Admitting: Psychiatry

## 2022-04-16 MED ORDER — BREXPIPRAZOLE 1 MG PO TABS
1.0000 mg | ORAL_TABLET | Freq: Every day | ORAL | 0 refills | Status: AC
  Filled 2022-04-16: qty 30, 30d supply, fill #0

## 2022-04-17 ENCOUNTER — Other Ambulatory Visit (HOSPITAL_COMMUNITY): Payer: Self-pay

## 2022-05-08 ENCOUNTER — Other Ambulatory Visit (HOSPITAL_COMMUNITY): Payer: Self-pay

## 2022-05-15 ENCOUNTER — Other Ambulatory Visit (HOSPITAL_COMMUNITY): Payer: Self-pay

## 2022-05-15 NOTE — Progress Notes (Deleted)
BH MD/PA/NP OP Progress Note  05/15/2022 1:17 PM Mary Roman  MRN:  [id number]  Chief Complaint: No chief complaint on file.  HPI: *** Visit Diagnosis: No diagnosis found.  Past Psychiatric History: Please see initial evaluation for full details. I have reviewed the history. No updates at this time.     Past Medical History:  Past Medical History:  Diagnosis Date   Absolute anemia 06/23/2018   Added automatically from request for surgery [id number]   Anemia    Anxiety    Backache 12/24/2007   Chronic back pain    Depression    Diabetes mellitus, type II ([place])    Headache(784.0)    Hypertension    Metabolic syndrome X 91/47/8295   Obesity    Palpitation    Sickle cell trait ([place])    Thyromegaly    Unspecified vitamin D deficiency 02/19/2013    Past Surgical History:  Procedure Laterality Date   COLONOSCOPY WITH PROPOFOL N/A 01/03/2021   Procedure: COLONOSCOPY WITH PROPOFOL;  Surgeon: Harvel Quale, MD;  Location: [facility];  Service: Gastroenterology;  Laterality: N/A;  AM   DILATATION AND CURETTAGE/HYSTEROSCOPY WITH MINERVA N/A 03/25/2019   Procedure: DILATATION AND CURETTAGE/HYSTEROSCOPY WITH  ATTEMPTED MINERVA ENDOMETRIAL ABLATION;  Surgeon: Florian Buff, MD;  Location: [facility];  Service: Gynecology;  Laterality: N/A;   None      Family Psychiatric History: Please see initial evaluation for full details. I have reviewed the history. No updates at this time.     Family History:  Family History  Problem Relation Age of Onset   Diabetes Mother    Hypertension Mother    Stroke Mother    Colon cancer Mother    Alcohol abuse Mother    Cancer - Colon Mother 57   Pneumonia Father    Depression Sister    Hypertension Sister    Anxiety disorder Sister    Hypertension Sister    Leukemia Sister    Heart attack Maternal Uncle    Hypertension Maternal Grandfather    Diabetes Maternal Grandmother    Hypertension Maternal Grandmother    Depression  Child     Social History:  Social History   Socioeconomic History   Marital status: Single    Spouse name: Not on file   Number of children: 2   Years of education: Not on file   Highest education level: Not on file  Occupational History   Occupation: employed in medical office   Tobacco Use   Smoking status: Never   Smokeless tobacco: Never  Vaping Use   Vaping Use: Never used  Substance and Sexual Activity   Alcohol use: No   Drug use: No   Sexual activity: Yes    Partners: Male    Birth control/protection: Other-see comments    Comment: bf had vasectomy  Other Topics Concern   Not on file  Social History Narrative   Not on file   Social Determinants of Health   Financial Resource Strain: Medium Risk (03/08/2022)   Overall Financial Resource Strain (CARDIA)    Difficulty of Paying Living Expenses: Somewhat hard  Food Insecurity: Unknown (03/08/2022)   Hunger Vital Sign    Worried About Running Out of Food in the Last Year: Patient refused    Ran Out of Food in the Last Year: Patient refused  Transportation Needs: No Transportation Needs (03/08/2022)   PRAPARE - Transportation    Lack of Transportation (Medical): No    Lack of  Transportation (Non-Medical): No  Physical Activity: Inactive (03/08/2022)   Exercise Vital Sign    Days of Exercise per Week: 0 days    Minutes of Exercise per Session: 0 min  Stress: Stress Concern Present (03/08/2022)   [place]    Feeling of Stress : Very much  Social Connections: Moderately Isolated (03/08/2022)   Social Connection and Isolation Panel [NHANES]    Frequency of Communication with Friends and Family: Twice a week    Frequency of Social Gatherings with Friends and Family: Once a week    Attends Religious Services: Never    Marine scientist or Organizations: No    Attends Music therapist: Never    Marital Status: Living with partner     Allergies: No Known Allergies  Metabolic Disorder Labs: Lab Results  Component Value Date   HGBA1C 7.7 (H) 04/10/2022   MPG 174.29 04/10/2022   MPG 240.3 08/25/2021   No results found for: "PROLACTIN" Lab Results  Component Value Date   CHOL 118 02/05/2022   TRIG 55 02/05/2022   HDL 31 (L) 02/05/2022   CHOLHDL 3.8 02/05/2022   VLDL 11 02/05/2022   LDLCALC 76 02/05/2022   [place] 68 08/25/2021   Lab Results  Component Value Date   TSH 1.564 08/25/2021   TSH 1.400 09/01/2020    Therapeutic Level Labs: No results found for: "LITHIUM" No results found for: "VALPROATE" No results found for: "CBMZ"  Current Medications: Current Outpatient Medications  Medication Sig Dispense Refill   acetaminophen (TYLENOL) 500 MG tablet Take 1,000 mg by mouth every 6 (six) hours as needed (pain).     ALPRAZolam (XANAX) 1 MG tablet Take one tablet by mouth at bedtime as needed , for anxiety and sleep 20 tablet 0   aspirin EC 81 MG tablet Take 1 tablet (81 mg total) by mouth daily. (Patient taking differently: Take 81 mg by mouth in the morning.) 90 tablet 3   benazepril (LOTENSIN) 5 MG tablet Take 1 tablet (5 mg total) by mouth daily. 90 tablet 3   blood glucose meter kit and supplies Dispense based on patient and insurance preference. Once daily testing dx e11.9 1 each 0   brexpiprazole (REXULTI) 1 MG TABS tablet Take 1 tablet (1 mg total) by mouth daily. 30 tablet 0   buPROPion (WELLBUTRIN XL) 150 MG 24 hr tablet Take 3 tablets (450 mg total) by mouth daily. 270 tablet 1   empagliflozin (JARDIANCE) 10 MG TABS tablet Take 1 tablet (10 mg total) by mouth daily before breakfast. 90 tablet 0   glipiZIDE (GLUCOTROL XL) 10 MG 24 hr tablet Take 1 tablet (10 mg total) by mouth daily with breakfast. 90 tablet 1   megestrol (MEGACE) 40 MG tablet Take 1 tablet (40 mg total) by mouth in the morning. 30 tablet 11   metoprolol tartrate (LOPRESSOR) 25 MG tablet Take 1 tablet (25 mg total) by mouth 2  (two) times daily. 180 tablet 3   ondansetron (ZOFRAN) 4 MG tablet Take 1 tablet (4 mg total) by mouth daily if needed for nausea 20 tablet 0   potassium chloride SA (KLOR-CON M) 20 MEQ tablet Take 1 tablet (20 mEq total) by mouth daily. 30 tablet 3   rosuvastatin (CRESTOR) 5 MG tablet Take 1 tablet (5 mg total) by mouth daily. 90 tablet 3   tirzepatide (MOUNJARO) 5 MG/0.5ML Pen Inject 5 mg into the skin once a week. 6 mL 0  tirzepatide (MOUNJARO) 7.5 MG/0.5ML Pen Inject 7.5 mg into the skin once a week. 6 mL 1   triamterene-hydrochlorothiazide (MAXZIDE-25) 37.5-25 MG tablet Take 0.5 tablet by mouth daily for blood pressure 45 tablet 3   venlafaxine XR (EFFEXOR-XR) 150 MG 24 hr capsule Take 1 capsule (150 mg total) by mouth daily. Take along with 75 mg capsule for a total of 225 mg daily. 90 capsule 0   venlafaxine XR (EFFEXOR-XR) 75 MG 24 hr capsule Take 1 capsule (75 mg total) by mouth daily. Total of 225 mg daily. Take along with 150 mg cap 90 capsule 0   zaleplon (SONATA) 10 MG capsule Take 1 capsule (10 mg total) by mouth at bedtime as needed for sleep. 30 capsule 1   No current facility-administered medications for this visit.     Musculoskeletal: Strength & Muscle Tone:  N/A Gait & Station:  N/A Patient leans: N/A  Psychiatric Specialty Exam: Review of Systems  There were no vitals taken for this visit.There is no height or weight on file to calculate BMI.  General Appearance: {Appearance:22683}  Eye Contact:  {BHH EYE CONTACT:22684}  Speech:  Clear and Coherent  Volume:  Normal  Mood:  {BHH MOOD:22306}  Affect:  {Affect (PAA):22687}  Thought Process:  Coherent  Orientation:  Full (Time, Place, and Person)  Thought Content: Logical   Suicidal Thoughts:  {ST/HT (PAA):22692}  Homicidal Thoughts:  {ST/HT (PAA):22692}  Memory:  Immediate;   Good  Judgement:  {Judgement (PAA):22694}  Insight:  {Insight (PAA):22695}  Psychomotor Activity:  Normal  Concentration:   Concentration: Good and Attention Span: Good  Recall:  Good  Fund of Knowledge: Good  Language: Good  Akathisia:  No  Handed:  Right  AIMS (if indicated): not done  Assets:  Communication Skills Desire for Improvement  ADL's:  Intact  Cognition: WNL  Sleep:  {BHH GOOD/FAIR/POOR:22877}   Screenings: GAD-7    Flowsheet Row Office Visit from 03/08/2022 in [place] Visit from 02/07/2022 in [facility] from 01/23/2022 in [facility] from 05/15/2018 in [facility] Office Visit from 01/08/2017 in [facility]  Total GAD-7 Score _0 PHQ2-9    [place] Visit from 04/12/2022 in [facility] Office Visit from 03/08/2022 in [place] Visit from 02/07/2022 in [facility] from 01/23/2022 in [facility] [facility] Office Visit from 11/28/2021 in [facility]  PHQ-2 Total Score _1 PHQ-9 Total Score _2 Flowsheet Row Video Visit from 09/14/2021 in [facility] Video Visit from 08/10/2021 in [place] Video Visit from 05/04/2021 in [facility] Error: Q3, 4, or 5 should not be populated when Q2 is No No Risk Low Risk        Assessment and Plan:  YALANDA SODERMAN is a 51 y.o. year old female with a history of depression, mild OSA on CPAP, diabetes, who presents for follow up appointment for below.   1. MDD (major depressive disorder), recurrent episode, moderate ([place]) 2. GAD (generalized anxiety disorder) There has been overall improvement since the improvement in her insomnia.  This also coincided with her fianc, who has started to work.  Psychosocial stressors includes financial strain, and her son with  anxiety. Other psychosocial  stressors includes conflict with her siblings around the care of her mother, who has cancer.  There is still concern of medication adherence, although it has been improving.  She is willing to take medication consistently.  Will continue venlafaxine, bupropion to target depression.  Will continue rexulti adjunctive treatment for depression.  Will discontinue Xanax at this time given it had limited benefit.    3. Insomnia, unspecified type Improving since starting Sonata.  Will continue current dose to target insomnia.    Plan Continue venlafaxine 225 mg daily - 150 mg was filled on April 4th for 90 days  Continue bupropion 450 mg daily  Continue rexulti 1 mg daily  (she is on metformin)- she will contact the office if she needs a refill Discontinue Xanax Continue Sonata 10 mg at night as needed for insomnia Next appointment 10/18 at 4:30 for 30 mins, video (she declined in person visit due to work schedule) - on gabapentin for pain and hydroxyzine - on mounjouraro   Past trials of medication: fluoxetine, duloxetine, Abilify (weight gain),  buspirone, Trazodone ("weird" feeling) temazepam, Ambien, Lunesta, Xanax      The patient demonstrates the following risk factors for suicide: Chronic risk factors for suicide include: psychiatric disorder of depression, anxiety and chronic pain. Acute risk factors for suicide include: N/A. Protective factors for this patient include: positive social support, responsibility to others (children, family), coping skills and hope for the future. Considering these factors, the overall suicide risk at this point appears to be low. Patient is appropriate for outpatient follow up.        Collaboration of Care: Collaboration of Care: {BH OP Collaboration of Care:21014065}  Patient/Guardian was advised Release of Information must be obtained prior to any record release in order to collaborate their care with an outside provider.  Patient/Guardian was advised if they have not already done so to contact the registration department to sign all necessary forms in order for [place] to release information regarding their care.   Consent: Patient/Guardian gives verbal consent for treatment and assignment of benefits for services provided during this visit. Patient/Guardian expressed understanding and agreed to proceed.    [name], MD 05/15/2022, 1:17 PM

## 2022-05-16 ENCOUNTER — Other Ambulatory Visit (HOSPITAL_COMMUNITY): Payer: Self-pay

## 2022-05-16 ENCOUNTER — Other Ambulatory Visit: Payer: Self-pay

## 2022-05-16 ENCOUNTER — Telehealth: Payer: No Typology Code available for payment source | Admitting: Psychiatry

## 2022-05-16 MED ORDER — TIRZEPATIDE 7.5 MG/0.5ML ~~LOC~~ SOAJ
7.5000 mg | SUBCUTANEOUS | 1 refills | Status: DC
  Filled 2022-05-16: qty 6, 84d supply, fill #0

## 2022-05-16 MED ORDER — ONDANSETRON HCL 4 MG PO TABS: 4.0000 mg | ORAL_TABLET | Freq: Every day | ORAL | 1 refills | Status: DC

## 2022-05-29 ENCOUNTER — Ambulatory Visit: Payer: No Typology Code available for payment source | Admitting: Family Medicine

## 2022-05-30 ENCOUNTER — Other Ambulatory Visit (HOSPITAL_COMMUNITY): Payer: Self-pay

## 2022-06-06 ENCOUNTER — Other Ambulatory Visit: Payer: Self-pay | Admitting: Psychiatry

## 2022-06-06 ENCOUNTER — Other Ambulatory Visit: Payer: Self-pay | Admitting: Family Medicine

## 2022-06-06 ENCOUNTER — Other Ambulatory Visit (HOSPITAL_COMMUNITY): Payer: Self-pay

## 2022-06-06 DIAGNOSIS — F5101 Primary insomnia: Secondary | ICD-10-CM

## 2022-06-06 MED ORDER — ZALEPLON 10 MG PO CAPS
10.0000 mg | ORAL_CAPSULE | Freq: Every evening | ORAL | 0 refills | Status: DC | PRN
  Filled 2022-06-06 – 2022-06-13 (×2): qty 10, 10d supply, fill #0

## 2022-06-06 MED ORDER — EMPAGLIFLOZIN 10 MG PO TABS
10.0000 mg | ORAL_TABLET | Freq: Every day | ORAL | 0 refills | Status: DC
  Filled 2022-06-06 – 2022-06-20 (×2): qty 90, 90d supply, fill #0

## 2022-06-13 ENCOUNTER — Other Ambulatory Visit (HOSPITAL_COMMUNITY): Payer: Self-pay

## 2022-06-15 ENCOUNTER — Other Ambulatory Visit (HOSPITAL_COMMUNITY): Payer: Self-pay

## 2022-06-16 NOTE — Progress Notes (Unsigned)
Virtual Visit via Video Note  I connected with Mary Roman on 06/18/22 at  4:00 PM EST by a video enabled telemedicine application and verified that I am speaking with the correct person using two identifiers.  Location: Patient: home Provider: office Persons participated in the visit- patient, provider    I discussed the limitations of evaluation and management by telemedicine and the availability of in person appointments. The patient expressed understanding and agreed to proceed.    I discussed the assessment and treatment plan with the patient. The patient was provided an opportunity to ask questions and all were answered. The patient agreed with the plan and demonstrated an understanding of the instructions.   The patient was advised to call back or seek an in-person evaluation if the symptoms worsen or if the condition fails to improve as anticipated.  I provided 18 minutes of non-face-to-face time during this encounter.   [name], MD    [facility] MD/PA/NP OP Progress Note  06/18/2022 4:35 PM Mary Roman  MRN:  [id number]  Chief Complaint:  Chief Complaint  Patient presents with   Follow-up   HPI:  - Xanax was prescribed and uptitrated by her PCP  This is a follow-up appointment for depression and anxiety.  She states that she located in the [place] for more affordability.  She could not find a place in [place], although she was hoping to find a place closer to her mother.  She had hard time with her siblings, who were blaming with each other.  She does not want to do with them.  She is concerned about her mother's condition, who has been declining.  Her mother is also struggling with depression.  She thinks her mother needs more care, although there is a home nurse coming during the day.  Her sister in [place] has been helping the most.  She has insomnia at times.  She feels fatigue.  She thinks she has lost a few pounds since being on Mounjaro.  She denies  SI.  She feels anxious and tense.She denies panic attacks.  She has been prescribed Xanax by her PCP, which has been helpful.  She verbalized understanding of its potential risk of tolerance.  She denies alcohol use or drug use.  She has been taking medication more consistently on weekends.  She may have missed to take it only once or so. She feels comfortable to stay on her current medication regimen.   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  one son, one 64 yo daughter  Visit Diagnosis:    ICD-10-CM   1. MDD (major depressive disorder), recurrent episode, moderate (HCC)  F33.1     2. GAD (generalized anxiety disorder)  F41.1     3. Insomnia, unspecified type  G47.00       Past Psychiatric History: Please see initial evaluation for full details. I have reviewed the history. No updates at this time.     Past Medical History:  Past Medical History:  Diagnosis Date   Absolute anemia 06/23/2018   Added automatically from request for surgery [id number]   Anemia    Anxiety    Backache 12/24/2007   Chronic back pain    Depression  Diabetes mellitus, type II ([place])    Headache(784.0)    Hypertension    Metabolic syndrome X 12/75/1700   Obesity    Palpitation    Sickle cell trait ([place])    Thyromegaly    Unspecified vitamin D deficiency 02/19/2013    Past Surgical History:  Procedure Laterality Date   COLONOSCOPY WITH PROPOFOL N/A 01/03/2021   Procedure: COLONOSCOPY WITH PROPOFOL;  Surgeon: Harvel Quale, MD;  Location: [facility] END[facility] SUITE;  Service: Gastroenterology;  Laterality: N/A;  AM   DILATATION AND CURETTAGE/HYSTEROSCOPY WITH MINERVA N/A 03/25/2019   Procedure: DILATATION AND CURETTAGE/HYSTEROSCOPY WITH   ATTEMPTED MINERVA ENDOMETRIAL ABLATION;  Surgeon: Florian Buff, MD;  Location: [facility] ORS;  Service: Gynecology;  Laterality: N/A;   None      Family Psychiatric History: Please see initial evaluation for full details. I have reviewed the history. No updates at this time.     Family History:  Family History  Problem Relation Age of Onset   Diabetes Mother    Hypertension Mother    Stroke Mother    Colon cancer Mother    Alcohol abuse Mother    Cancer - Colon Mother 81   Pneumonia Father    Depression Sister    Hypertension Sister    Anxiety disorder Sister    Hypertension Sister    Leukemia Sister    Heart attack Maternal Uncle    Hypertension Maternal Grandfather    Diabetes Maternal Grandmother    Hypertension Maternal Grandmother    Depression Child     Social History:  Social History   Socioeconomic History   Marital status: Single    Spouse name: Not on file   Number of children: 2   Years of education: Not on file   Highest education level: Not on file  Occupational History   Occupation: employed in medical office   Tobacco Use   Smoking status: Never   Smokeless tobacco: Never  Vaping Use   Vaping Use: Never used  Substance and Sexual Activity   Alcohol use: No   Drug use: No   Sexual activity: Yes    Partners: Male    Birth control/protection: Other-see comments    Comment: bf had vasectomy  Other Topics Concern   Not on file  Social History Narrative   Not on file   Social Determinants of Health   Financial Resource Strain: Medium Risk (03/08/2022)   Overall Financial Resource Strain (CARDIA)    Difficulty of Paying Living Expenses: Somewhat hard  Food Insecurity: Unknown (03/08/2022)   Hunger Vital Sign    Worried About Running Out of Food in the Last Year: Patient refused    Ran Out of Food in the Last Year: Patient refused  Transportation Needs: No Transportation Needs (03/08/2022)   PRAPARE - Hydrologist  (Medical): No    Lack of Transportation (Non-Medical): No  Physical Activity: Inactive (03/08/2022)   Exercise Vital Sign    Days of Exercise per Week: 0 days    Minutes of Exercise per Session: 0 min  Stress: Stress Concern Present (03/08/2022)   [place]    Feeling of Stress : Very much  Social Connections: Moderately Isolated (03/08/2022)   Social Connection and Isolation Panel [NHANES]    Frequency of Communication with Friends and Family: Twice a week    Frequency of Social Gatherings with Friends and Family: Once a week    Attends  Religious Services: Never    Active Member of Clubs or Organizations: No    Attends Archivist Meetings: Never    Marital Status: Living with partner    Allergies: No Known Allergies  Metabolic Disorder Labs: Lab Results  Component Value Date   HGBA1C 7.7 (H) 04/10/2022   MPG 174.29 04/10/2022   MPG 240.3 08/25/2021   No results found for: "PROLACTIN" Lab Results  Component Value Date   CHOL 118 02/05/2022   TRIG 55 02/05/2022   HDL 31 (L) 02/05/2022   CHOLHDL 3.8 02/05/2022   VLDL 11 02/05/2022   LDLCALC 76 02/05/2022   LDLCALC 68 08/25/2021   Lab Results  Component Value Date   TSH 1.564 08/25/2021   TSH 1.400 09/01/2020    Therapeutic Level Labs: No results found for: "LITHIUM" No results found for: "VALPROATE" No results found for: "CBMZ"  Current Medications: Current Outpatient Medications  Medication Sig Dispense Refill   brexpiprazole (REXULTI) 1 MG TABS tablet Take 1 mg by mouth daily.     acetaminophen (TYLENOL) 500 MG tablet Take 1,000 mg by mouth every 6 (six) hours as needed (pain).     ALPRAZolam (XANAX) 1 MG tablet TAKE 1 TABLET BY MOUTH AT BEDTIME AS NEEDED FOR ANXIETY AND FOR SLEEP 20 tablet 0   aspirin EC 81 MG tablet Take 1 tablet (81 mg total) by mouth daily. (Patient taking differently: Take 81 mg by mouth in the morning.) 90 tablet 3    benazepril (LOTENSIN) 5 MG tablet Take 1 tablet (5 mg total) by mouth daily. 90 tablet 3   blood glucose meter kit and supplies Dispense based on patient and insurance preference. Once daily testing dx e11.9 1 each 0   buPROPion (WELLBUTRIN XL) 150 MG 24 hr tablet Take 3 tablets (450 mg total) by mouth daily. 270 tablet 1   empagliflozin (JARDIANCE) 10 MG TABS tablet Take 1 tablet (10 mg total) by mouth daily before breakfast. 90 tablet 0   glipiZIDE (GLUCOTROL XL) 10 MG 24 hr tablet Take 1 tablet (10 mg total) by mouth daily with breakfast. 90 tablet 1   megestrol (MEGACE) 40 MG tablet Take 1 tablet (40 mg total) by mouth in the morning. 30 tablet 11   metoprolol tartrate (LOPRESSOR) 25 MG tablet Take 1 tablet (25 mg total) by mouth 2 (two) times daily. 180 tablet 3   ondansetron (ZOFRAN) 4 MG tablet Take 1 tablet (4 mg total) by mouth daily if needed for nausea 20 tablet 1   potassium chloride SA (KLOR-CON M) 20 MEQ tablet Take 1 tablet (20 mEq total) by mouth daily. 30 tablet 3   rosuvastatin (CRESTOR) 5 MG tablet Take 1 tablet (5 mg total) by mouth daily. 90 tablet 3   tirzepatide (MOUNJARO) 5 MG/0.5ML Pen Inject 5 mg into the skin once a week. 6 mL 0   tirzepatide (MOUNJARO) 7.5 MG/0.5ML Pen Inject 7.5 mg into the skin once a week. 6 mL 1   triamterene-hydrochlorothiazide (MAXZIDE-25) 37.5-25 MG tablet Take 0.5 tablet by mouth daily for blood pressure 45 tablet 3   venlafaxine XR (EFFEXOR-XR) 150 MG 24 hr capsule Take 1 capsule (150 mg total) by mouth daily. Take along with 75 mg capsule for a total of 225 mg daily. 90 capsule 0   venlafaxine XR (EFFEXOR-XR) 75 MG 24 hr capsule Take 1 capsule (75 mg total) by mouth daily. Total of 225 mg daily. Take along with 150 mg cap 90 capsule 0   zaleplon (  SONATA) 10 MG capsule Take 1 capsule (10 mg total) by mouth at bedtime as needed for up to 10 days for sleep. 10 capsule 0   No current facility-administered medications for this visit.      Musculoskeletal: Strength & Muscle Tone:  N/A Gait & Station:  N/A Patient leans: N/A  Psychiatric Specialty Exam: Review of Systems  Psychiatric/Behavioral:  Positive for decreased concentration, dysphoric mood and sleep disturbance. Negative for agitation, behavioral problems, confusion, hallucinations, self-injury and suicidal ideas. The patient is nervous/anxious. The patient is not hyperactive.   All other systems reviewed and are negative.   There were no vitals taken for this visit.There is no height or weight on file to calculate BMI.  General Appearance: Fairly Groomed  Eye Contact:  Good  Speech:  Clear and Coherent  Volume:  Normal  Mood:  Anxious  Affect:  Appropriate, Congruent, and slightly down  Thought Process:  Coherent  Orientation:  Full (Time, Place, and Person)  Thought Content: Logical   Suicidal Thoughts:  No  Homicidal Thoughts:  No  Memory:  Immediate;   Good  Judgement:  Good  Insight:  Good  Psychomotor Activity:  Normal  Concentration:  Concentration: Good and Attention Span: Good  Recall:  Good  Fund of Knowledge: Good  Language: Good  Akathisia:  No  Handed:  Right  AIMS (if indicated): not done  Assets:  Communication Skills Desire for Improvement  ADL's:  Intact  Cognition: WNL  Sleep:  Fair   Screenings: GAD-7    Flowsheet Row Office Visit from 03/08/2022 in [place] Office Visit from 02/07/2022 in [place] from 01/23/2022 in [place] from 05/15/2018 in [place] Office Visit from 01/08/2017 in [facility]  Total GAD-7 Score _0 PHQ2-9    [place] Office Visit from 04/12/2022 in [facility] Office Visit from 03/08/2022 in [place] Visit from 02/07/2022 in [place] from 01/23/2022 in [place] Office Visit from 11/28/2021 in [facility]  PHQ-2 Total Score _1 PHQ-9 Total Score _2 Flowsheet Row Video Visit from 09/14/2021 in [place] Video Visit from 08/10/2021 in [place] Video Visit from 05/04/2021 in [place] Error: Q3, 4, or 5 should not be populated when Q2 is No No Risk Low Risk        Assessment and Plan:  RILLA BUCKMAN is a 51 y.o. year old female with a history of depression, mild OSA on CPAP, diabetes, who presents for follow up appointment for below.    1. MDD (major depressive disorder), recurrent episode, moderate ([place]) 2. GAD (generalized anxiety disorder) Although she continues to report depressive symptoms and anxiety, there has been slight improvement since the last visit.  Psychosocial stressors includes conflict among her siblings, her fianc with diabetes, her mother with cancer, and financial strain.  She reports improvement in adherence of the medication.  Will continue current dose of venlafaxine, bupropion, rexulti to target depression.  She will greatly benefit from CBT; she agrees to have follow-up with Ms. Bynum.   3. Insomnia, unspecified type She has occasional insomnia.  Will continue current dose of Sonata to target insomnia.     Plan (she  will contact the office if she needs a refill) Continue venlafaxine 225 mg daily  Continue bupropion 450 mg daily  Continue rexulti 1 mg daily  (she is on metformin)  Continue Sonata 10 mg at night as needed for insomnia Next appointment 1/15 at 4:30 for 30 mins, video (she declined in person visit due to work schedule) - on gabapentin for pain and hydroxyzine - on Mounjaro - on xanax, prescribed by her PCP   Past trials of medication: fluoxetine, duloxetine, Abilify (weight gain),  buspirone, Trazodone ("weird" feeling)  temazepam, Ambien, Lunesta, Xanax      The patient demonstrates the following risk factors for suicide: Chronic risk factors for suicide include: psychiatric disorder of depression, anxiety and chronic pain. Acute risk factors for suicide include: N/A. Protective factors for this patient include: positive social support, responsibility to others (children, family), coping skills and hope for the future. Considering these factors, the overall suicide risk at this point appears to be low. Patient is appropriate for outpatient follow up.   I have utilized the [place] Controlled Substances Reporting System (PMP AWARxE) to confirm adherence regarding the patient's medication. My review reveals appropriate prescription fills.   This clinician has discussed the side effect associated with medication prescribed during this encounter. Please refer to notes in the previous encounters for more details.          Collaboration of Care: Collaboration of Care: Other reviewed notes in Epic  Patient/Guardian was advised Release of Information must be obtained prior to any record release in order to collaborate their care with an outside provider. Patient/Guardian was advised if they have not already done so to contact the registration department to sign all necessary forms in order for [place] to release information regarding their care.   Consent: Patient/Guardian gives verbal consent for treatment and assignment of benefits for services provided during this visit. Patient/Guardian expressed understanding and agreed to proceed.    [name], MD 06/18/2022, 4:35 PM

## 2022-06-18 ENCOUNTER — Other Ambulatory Visit (HOSPITAL_COMMUNITY): Payer: Self-pay

## 2022-06-18 ENCOUNTER — Encounter: Payer: Self-pay | Admitting: Psychiatry

## 2022-06-18 ENCOUNTER — Telehealth (INDEPENDENT_AMBULATORY_CARE_PROVIDER_SITE_OTHER): Payer: No Typology Code available for payment source | Admitting: Psychiatry

## 2022-06-18 DIAGNOSIS — F411 Generalized anxiety disorder: Secondary | ICD-10-CM | POA: Diagnosis not present

## 2022-06-18 DIAGNOSIS — G4700 Insomnia, unspecified: Secondary | ICD-10-CM

## 2022-06-18 DIAGNOSIS — F331 Major depressive disorder, recurrent, moderate: Secondary | ICD-10-CM | POA: Diagnosis not present

## 2022-06-18 MED ORDER — ZALEPLON 10 MG PO CAPS
10.0000 mg | ORAL_CAPSULE | Freq: Every evening | ORAL | 1 refills | Status: DC | PRN
  Filled 2022-06-18 – 2022-06-20 (×2): qty 30, 30d supply, fill #0
  Filled 2022-07-30 – 2022-07-31 (×2): qty 30, 30d supply, fill #1

## 2022-06-19 ENCOUNTER — Other Ambulatory Visit (HOSPITAL_COMMUNITY): Payer: Self-pay

## 2022-06-20 ENCOUNTER — Other Ambulatory Visit (HOSPITAL_COMMUNITY): Payer: Self-pay

## 2022-07-10 ENCOUNTER — Ambulatory Visit (INDEPENDENT_AMBULATORY_CARE_PROVIDER_SITE_OTHER): Payer: No Typology Code available for payment source | Admitting: Family Medicine

## 2022-07-10 ENCOUNTER — Other Ambulatory Visit (HOSPITAL_COMMUNITY): Payer: Self-pay

## 2022-07-10 ENCOUNTER — Encounter: Payer: Self-pay | Admitting: Family Medicine

## 2022-07-10 VITALS — BP 140/88 | HR 86 | Ht 65.0 in | Wt 194.0 lb

## 2022-07-10 DIAGNOSIS — I10 Essential (primary) hypertension: Secondary | ICD-10-CM | POA: Diagnosis not present

## 2022-07-10 DIAGNOSIS — E1159 Type 2 diabetes mellitus with other circulatory complications: Secondary | ICD-10-CM | POA: Diagnosis not present

## 2022-07-10 DIAGNOSIS — Z23 Encounter for immunization: Secondary | ICD-10-CM | POA: Diagnosis not present

## 2022-07-10 DIAGNOSIS — E785 Hyperlipidemia, unspecified: Secondary | ICD-10-CM | POA: Diagnosis not present

## 2022-07-10 DIAGNOSIS — E669 Obesity, unspecified: Secondary | ICD-10-CM

## 2022-07-10 DIAGNOSIS — E559 Vitamin D deficiency, unspecified: Secondary | ICD-10-CM

## 2022-07-10 DIAGNOSIS — F5104 Psychophysiologic insomnia: Secondary | ICD-10-CM

## 2022-07-10 MED ORDER — BLOOD PRESSURE KIT: PACK | 0 refills | Status: AC

## 2022-07-10 MED ORDER — TIRZEPATIDE 10 MG/0.5ML ~~LOC~~ SOAJ
10.0000 mg | SUBCUTANEOUS | 3 refills | Status: DC
  Filled 2022-07-10: qty 2, 28d supply, fill #0

## 2022-07-10 MED ORDER — TRIAMTERENE-HCTZ 37.5-25 MG PO TABS
1.0000 | ORAL_TABLET | Freq: Every day | ORAL | 3 refills | Status: DC
  Filled 2022-07-10: qty 90, 90d supply, fill #0
  Filled 2022-10-22: qty 90, 90d supply, fill #1

## 2022-07-10 MED ORDER — ALPRAZOLAM 1 MG PO TABS: ORAL_TABLET | ORAL | 3 refills | Status: DC

## 2022-07-10 NOTE — Patient Instructions (Addendum)
F/U in  3.5 months, call if you need me  before,   Nurse visit for Bp CHECK IN 6 WEEKS AND TDap AT THAT VISIT  IncreASE TRIAMTERENE TO One DAILY, BLOOD PRESSURE IS HIGH NURSE PLS ORDER BP CUFF FOR HER   Xanax reduces to 10 per month, max, script at [facility], goal is to wean OFF  Shingrix #2 today  HBA1C, chem 7 and EGFr today  Fasting lipid, cmp and EGFR, HBA1C, CBC , TSH, 5 days before next  visit WITH MD  IN 3.5 MONTHS  It is important that you exercise regularly at least 30 minutes 5 times a week. If you develop chest pain, have severe difficulty breathing, or feel very tired, stop exercising immediately and seek medical attention     It is important that you exercise regularly at least 30 minutes 5 times a week. If you develop chest pain, have severe difficulty breathing, or feel very tired, stop exercising immediately and seek medical attention   Think about what you will eat, plan ahead. Choose " clean, green, fresh or frozen" over canned, processed or packaged foods which are more sugary, salty and fatty. 70 to 75% of food eaten should be vegetables and fruit. Three meals at set times with snacks allowed between meals, but they must be fruit or vegetables. Aim to eat over a 12 hour period , example 7 am to 7 pm, and STOP after  your last meal of the day. Drink water,generally about 64 ounces per day, no other drink is as healthy. Fruit juice is best enjoyed in a healthy way, by EATING the fruit.  

## 2022-07-11 ENCOUNTER — Telehealth: Payer: Self-pay

## 2022-07-11 LAB — BMP8+EGFR
BUN/Creatinine Ratio: 7 — ABNORMAL LOW (ref 9–23)
BUN: 9 mg/dL (ref 6–24)
CO2: 23 mmol/L (ref 20–29)
Calcium: 9.4 mg/dL (ref 8.7–10.2)
Chloride: 105 mmol/L (ref 96–106)
Creatinine, Ser: 1.38 mg/dL — ABNORMAL HIGH (ref 0.57–1.00)
Glucose: 170 mg/dL — ABNORMAL HIGH (ref 70–99)
Potassium: 3.8 mmol/L (ref 3.5–5.2)
Sodium: 143 mmol/L (ref 134–144)
eGFR: 46 mL/min/{1.73_m2} — ABNORMAL LOW (ref >59)

## 2022-07-11 LAB — HEMOGLOBIN A1C
Est. average glucose Bld gHb Est-mCnc: 192 mg/dL
Hgb A1c MFr Bld: 8.3 % — ABNORMAL HIGH (ref 4.8–5.6)

## 2022-07-11 NOTE — Telephone Encounter (Signed)
Patient called concerned states her arm is red and swollen and sore to touch where she received her shingrix # 2 shot yesterday please advise ?

## 2022-07-11 NOTE — Telephone Encounter (Signed)
Patient aware.

## 2022-07-12 ENCOUNTER — Other Ambulatory Visit: Payer: Self-pay

## 2022-07-12 DIAGNOSIS — E1159 Type 2 diabetes mellitus with other circulatory complications: Secondary | ICD-10-CM

## 2022-07-14 ENCOUNTER — Encounter: Payer: Self-pay | Admitting: Family Medicine

## 2022-07-14 NOTE — Assessment & Plan Note (Addendum)
Sleep hygiene reviewed and written information offered also. Prescription sent for  medication needed.  

## 2022-07-14 NOTE — Assessment & Plan Note (Signed)
Mary Roman is reminded of the importance of commitment to daily physical activity for 30 minutes or more, as able and the need to limit carbohydrate intake to 30 to 60 grams per meal to help with blood sugar control.   The need to take medication as prescribed, test blood sugar as directed, and to call between visits if there is a concern that blood sugar is uncontrolled is also discussed.   Mary Roman is reminded of the importance of daily foot exam, annual eye examination, and good blood sugar, blood pressure and cholesterol control.     Latest Ref Rng & Units 07/10/2022    3:28 PM 04/12/2022    3:16 PM 04/10/2022    8:33 AM 02/05/2022    8:32 AM 11/28/2021    4:07 PM  Diabetic Labs  HbA1c 4.8 - 5.6 % 8.3   7.7   8.2   Micro/Creat Ratio 0 - 29 mg/g creat  5      Chol 0 - 200 mg/dL    118    HDL >40 mg/dL    31    Calc LDL 0 - 99 mg/dL    76    Triglycerides <150 mg/dL    55    Creatinine 0.57 - 1.00 mg/dL 1.38   1.08  1.02        07/10/2022    3:03 PM 07/10/2022    3:02 PM 07/10/2022    2:22 PM 04/12/2022    2:32 PM 03/08/2022    8:36 AM 02/07/2022    3:06 PM 02/07/2022    2:36 PM  BP/Weight  Systolic BP 315 176 160 737 106 269 485  Diastolic BP 88 90 84 65 74 88 88  Wt. (Lbs)   194 191 188  190.04  BMI   32.28 kg/m2 31.78 kg/m2 31.28 kg/m2  31.62 kg/m2      Latest Ref Rng & Units 07/10/2022    2:20 PM 11/30/2021   12:00 AM  Foot/eye exam completion dates  Eye Exam No Retinopathy  No Retinopathy      Foot Form Completion  Done      This result is from an external source.      Uncontrolled, needs to follow diet, referred to educator , needs re eval in 6 weeks with log

## 2022-07-14 NOTE — Assessment & Plan Note (Signed)
  Patient re-educated about  the importance of commitment to a  minimum of 150 minutes of exercise per week as able.  The importance of healthy food choices with portion control discussed, as well as eating regularly and within a 12 hour window most days. The need to choose "clean , green" food 50 to 75% of the time is discussed, as well as to make water the primary drink and set a goal of 64 ounces water daily.       07/10/2022    2:22 PM 04/12/2022    2:32 PM 03/08/2022    8:36 AM  Weight /BMI  Weight 194 lb 191 lb 188 lb  Height '5\' 5"'$  (1.651 m) '5\' 5"'$  (1.651 m) '5\' 5"'$  (1.651 m)  BMI 32.28 kg/m2 31.78 kg/m2 31.28 kg/m2    '

## 2022-07-14 NOTE — Progress Notes (Signed)
Mary Roman     MRN: [id number]      DOB: [birth year]   HPI Mary Roman is here for follow up and re-evaluation of chronic medical conditions, medication management and review of any available recent lab and radiology data.  Preventive health is updated, specifically  Cancer screening and Immunization.   Questions or concerns regarding consultations or procedures which the PT has had in the interim are  addressed. The PT denies any adverse reactions to current medications since the last visit.  There are no new concerns.  There are no specific complaints  Denies polyuria, polydipsia, blurred vision , or hypoglycemic episodes. No regular exercise  ROS Denies recent fever or chills. Denies sinus pressure, nasal congestion, ear pain or sore throat. Denies chest congestion, productive cough or wheezing. Denies chest pains, palpitations and leg swelling Denies abdominal pain, nausea, vomiting,diarrhea or constipation.   Denies dysuria, frequency, hesitancy or incontinence. Denies joint pain, swelling and limitation in mobility. Denies headaches, seizures, numbness, or tingling. Chronic  depression and  anxiety   PE  BP (!) 140/88   Pulse 86   Ht '5\' 5"'$  (1.651 m)   Wt 194 lb (88 kg)   SpO2 96%   BMI 32.28 kg/m   Patient alert and oriented and in no cardiopulmonary distress.  HEENT: No facial asymmetry, EOMI,     Neck supple .  Chest: Clear to auscultation bilaterally.  CVS: S1, S2 no murmurs, no S3.Regular rate.  ABD: Soft non tender.   Ext: No edema  MS: Adequate ROM spine, shoulders, hips and knees.  Skin: Intact, no ulcerations or rash noted.  Psych: Good eye contact, normal affect. Memory intact not anxious or depressed appearing.  CNS: CN 2-12 intact, power,  normal throughout.no focal deficits noted.   Assessment & Plan  Essential hypertension Uncontrolled inc dose of maxzide and re eval in 6 weeks DASH diet and commitment to daily physical  activity for a minimum of 30 minutes discussed and encouraged, as a part of hypertension management. The importance of attaining a healthy weight is also discussed.     07/10/2022    3:03 PM 07/10/2022    3:02 PM 07/10/2022    2:22 PM 04/12/2022    2:32 PM 03/08/2022    8:36 AM 02/07/2022    3:06 PM 02/07/2022    2:36 PM  BP/Weight  Systolic BP 329 518 841 660 630 160 109  Diastolic BP 88 90 84 65 74 88 88  Wt. (Lbs)   194 191 188  190.04  BMI   32.28 kg/m2 31.78 kg/m2 31.28 kg/m2  31.62 kg/m2       Type 2 diabetes mellitus with vascular disease ([place]) Mary Roman is reminded of the importance of commitment to daily physical activity for 30 minutes or more, as able and the need to limit carbohydrate intake to 30 to 60 grams per meal to help with blood sugar control.   The need to take medication as prescribed, test blood sugar as directed, and to call between visits if there is a concern that blood sugar is uncontrolled is also discussed.   Mary Roman is reminded of the importance of daily foot exam, annual eye examination, and good blood sugar, blood pressure and cholesterol control.     Latest Ref Rng & Units 07/10/2022    3:28 PM 04/12/2022    3:16 PM 04/10/2022    8:33 AM 02/05/2022    8:32 AM 11/28/2021    4:07 PM  Diabetic Labs  HbA1c 4.8 - 5.6 % 8.3   7.7   8.2   Micro/Creat Ratio 0 - 29 mg/g creat  5      Chol 0 - 200 mg/dL    118    HDL >40 mg/dL    31    Calc LDL 0 - 99 mg/dL    76    Triglycerides <150 mg/dL    55    Creatinine 0.57 - 1.00 mg/dL 1.38   1.08  1.02        07/10/2022    3:03 PM 07/10/2022    3:02 PM 07/10/2022    2:22 PM 04/12/2022    2:32 PM 03/08/2022    8:36 AM 02/07/2022    3:06 PM 02/07/2022    2:36 PM  BP/Weight  Systolic BP 071 219 758 832 549 826 415  Diastolic BP 88 90 84 65 74 88 88  Wt. (Lbs)   194 191 188  190.04  BMI   32.28 kg/m2 31.78 kg/m2 31.28 kg/m2  31.62 kg/m2      Latest Ref Rng & Units 07/10/2022    2:20 PM 11/30/2021    12:00 AM  Foot/eye exam completion dates  Eye Exam No Retinopathy  No Retinopathy      Foot Form Completion  Done      This result is from an external source.      Uncontrolled, needs to follow diet, referred to educator , needs re eval in 6 weeks with log  Obesity (BMI 30.0-34.9)  Patient re-educated about  the importance of commitment to a  minimum of 150 minutes of exercise per week as able.  The importance of healthy food choices with portion control discussed, as well as eating regularly and within a 12 hour window most days. The need to choose "clean , green" food 50 to 75% of the time is discussed, as well as to make water the primary drink and set a goal of 64 ounces water daily.       07/10/2022    2:22 PM 04/12/2022    2:32 PM 03/08/2022    8:36 AM  Weight /BMI  Weight 194 lb 191 lb 188 lb  Height '5\' 5"'$  (1.651 m) '5\' 5"'$  (1.651 m) '5\' 5"'$  (1.651 m)  BMI 32.28 kg/m2 31.78 kg/m2 31.28 kg/m2    '  Insomnia Sleep hygiene reviewed and written information offered also. Prescription sent for  medication needed.

## 2022-07-14 NOTE — Assessment & Plan Note (Signed)
Uncontrolled inc dose of maxzide and re eval in 6 weeks DASH diet and commitment to daily physical activity for a minimum of 30 minutes discussed and encouraged, as a part of hypertension management. The importance of attaining a healthy weight is also discussed.     07/10/2022    3:03 PM 07/10/2022    3:02 PM 07/10/2022    2:22 PM 04/12/2022    2:32 PM 03/08/2022    8:36 AM 02/07/2022    3:06 PM 02/07/2022    2:36 PM  BP/Weight  Systolic BP 196 222 979 892 119 417 408  Diastolic BP 88 90 84 65 74 88 88  Wt. (Lbs)   194 191 188  190.04  BMI   32.28 kg/m2 31.78 kg/m2 31.28 kg/m2  31.62 kg/m2

## 2022-07-16 ENCOUNTER — Other Ambulatory Visit: Payer: Self-pay | Admitting: Obstetrics & Gynecology

## 2022-07-24 ENCOUNTER — Telehealth (INDEPENDENT_AMBULATORY_CARE_PROVIDER_SITE_OTHER): Payer: No Typology Code available for payment source | Admitting: Family Medicine

## 2022-07-24 ENCOUNTER — Encounter: Payer: Self-pay | Admitting: Family Medicine

## 2022-07-24 DIAGNOSIS — R059 Cough, unspecified: Secondary | ICD-10-CM

## 2022-07-24 DIAGNOSIS — J029 Acute pharyngitis, unspecified: Secondary | ICD-10-CM | POA: Diagnosis not present

## 2022-07-24 MED ORDER — GUAIFENESIN 100 MG/5ML PO LIQD: 5.0000 mL | ORAL | 0 refills | Status: DC | PRN

## 2022-07-24 MED ORDER — AMOXICILLIN 500 MG PO CAPS: 500.0000 mg | ORAL_CAPSULE | Freq: Three times a day (TID) | ORAL | 0 refills | Status: AC

## 2022-07-24 NOTE — Progress Notes (Signed)
Virtual Visit via Telephone Note   This visit type was conducted via telephone. This format is felt to be most appropriate for this patient at this time.  The patient did not have access to video technology/had technical difficulties with video requiring transitioning to audio format only (telephone).  All issues noted in this document were discussed and addressed.  No physical exam could be performed with this format.  Evaluation Performed:  Follow-up visit  Date:  07/24/2022   ID:  Mary Roman, Mary Roman May 24, 1971, MRN [id number]  Patient Location: Home Provider Location: Clinic  Participants: Patient Location of Patient: Home Location of Provider: Clinic Consent was obtain for visit to be over via telehealth. I verified that I am speaking with the correct person using two identifiers.  PCP:  Fayrene Helper, MD   Chief Complaint: Cough  History of Present Illness:    Mary Roman is a 51 y.o. female with complaints of cough, sore throat that worsens with swallowing, hoarseness, and congestion.  Onset of symptoms on 07/21/2022.she reports negative COVID test, noting that she felt feverish on 07/21/2022.  She complains of a nonproductive cough that keeps her up at nighttime.  She denies sinus pressure.  She has been taking over-the-counter NyQuil with minimal relief of her symptoms.  daches. Hard to swallow.     The patient does not have symptoms concerning for COVID-19 infection (fever, chills, cough, or new shortness of breath).   Past Medical, Surgical, Social History, Allergies, and Medications have been Reviewed.  Past Medical History:  Diagnosis Date   Absolute anemia 06/23/2018   Added automatically from request for surgery [id number]   Anemia    Anxiety    Backache 12/24/2007   Chronic back pain    Depression    Diabetes mellitus, type II ([place])    Headache(784.0)    Hypertension    Metabolic syndrome X 61/44/3154   Obesity    Palpitation     Sickle cell trait ([place])    Thyromegaly    Unspecified vitamin D deficiency 02/19/2013   Past Surgical History:  Procedure Laterality Date   COLONOSCOPY WITH PROPOFOL N/A 01/03/2021   Procedure: COLONOSCOPY WITH PROPOFOL;  Surgeon: Harvel Quale, MD;  [facility];  Service: Gastroenterology;  Laterality: N/A;  AM   DILATATION AND CURETTAGE/HYSTEROSCOPY WITH MINERVA N/A 03/25/2019   Procedure: DILATATION AND CURETTAGE/HYSTEROSCOPY WITH  ATTEMPTED MINERVA ENDOMETRIAL ABLATION;  Surgeon: Florian Buff, MD;  Location: [facility];  Service: Gynecology;  Laterality: N/A;   None       Current Meds  Medication Sig   acetaminophen (TYLENOL) 500 MG tablet Take 1,000 mg by mouth every 6 (six) hours as needed (pain).   ALPRAZolam (XANAX) 1 MG tablet Take one tablet every 3 days , if needed, for sleep   amoxicillin (AMOXIL) 500 MG capsule Take 1 capsule (500 mg total) by mouth 3 (three) times daily for 10 days.   aspirin EC 81 MG tablet Take 1 tablet (81 mg total) by mouth daily. (Patient taking differently: Take 81 mg by mouth in the morning.)   benazepril (LOTENSIN) 5 MG tablet Take 1 tablet (5 mg total) by mouth daily.   blood glucose meter kit and supplies Dispense based on patient and insurance preference. Once daily testing dx e11.9   Blood Pressure KIT Use to check blood pressure daily.   brexpiprazole (REXULTI) 1 MG TABS tablet Take 1 mg by mouth daily.   empagliflozin (JARDIANCE) 10  MG TABS tablet Take 1 tablet (10 mg total) by mouth daily before breakfast.   glipiZIDE (GLUCOTROL XL) 10 MG 24 hr tablet Take 1 tablet (10 mg total) by mouth daily with breakfast.   guaiFENesin (ROBITUSSIN) 100 MG/5ML liquid Take 5 mLs by mouth every 4 (four) hours as needed for cough or to loosen phlegm.   megestrol (MEGACE) 40 MG tablet Take 1 tablet (40 mg total) by mouth in the morning.   metoprolol tartrate (LOPRESSOR) 25 MG tablet Take 1 tablet (25 mg total) by mouth 2 (two) times daily.    ondansetron (ZOFRAN) 4 MG tablet Take 1 tablet (4 mg total) by mouth daily if needed for nausea   potassium chloride SA (KLOR-CON M) 20 MEQ tablet Take 1 tablet (20 mEq total) by mouth daily.   rosuvastatin (CRESTOR) 5 MG tablet Take 1 tablet (5 mg total) by mouth daily.   tirzepatide (MOUNJARO) 10 MG/0.5ML Pen Inject 10 mg into the skin once a week.   triamterene-hydrochlorothiazide (MAXZIDE-25) 37.5-25 MG tablet Take 1 tablet by mouth daily.   zaleplon (SONATA) 10 MG capsule Take 1 capsule (10 mg total) by mouth at bedtime as needed for sleep.     Allergies:   Patient has no known allergies.   ROS:   Please see the history of present illness.     All other systems reviewed and are negative.   Labs/Other Tests and Data Reviewed:    Recent Labs: 08/25/2021: Hemoglobin 12.5; Platelets 421; TSH 1.564 02/05/2022: ALT 20 07/10/2022: BUN 9; Creatinine, Ser 1.38; Potassium 3.8; Sodium 143   Recent Lipid Panel Lab Results  Component Value Date/Time   CHOL 118 02/05/2022 08:32 AM   CHOL 122 09/01/2020 08:10 AM   TRIG 55 02/05/2022 08:32 AM   HDL 31 (L) 02/05/2022 08:32 AM   HDL 25 (L) 09/01/2020 08:10 AM   CHOLHDL 3.8 02/05/2022 08:32 AM   LDLCALC 76 02/05/2022 08:32 AM   LDLCALC 80 09/01/2020 08:10 AM   LDLCALC 96 03/06/2018 09:28 AM    Wt Readings from Last 3 Encounters:  07/10/22 194 lb (88 kg)  04/12/22 191 lb (86.6 kg)  03/08/22 188 lb (85.3 kg)     Objective:    Vital Signs:  There were no vitals taken for this visit.     ASSESSMENT & PLAN:   Pharyngitis Will treat with amoxicillin 500 mg 3 times daily for 10 days Encouraged to take Robitussin for cough Take medication as prescribed Increase fluids and allow for plenty of rest. Recommend Tylenol or ibuprofen as needed for pain, fever, or general discomfort. Warm salt water gargles 3-4 times daily to help with throat pain or discomfort. Recommend using a humidifier at bedtime during sleep to help with cough and  nasal congestion. Follow-up if your symptoms do not improve    Time:   Today, I have spent 12 minutes reviewing the chart, including problem list, medications, and with the patient with telehealth technology discussing the above problems.   Medication Adjustments/Labs and Tests Ordered: Current medicines are reviewed at length with the patient today.  Concerns regarding medicines are outlined above.   Tests Ordered: No orders of the defined types were placed in this encounter.   Medication Changes: Meds ordered this encounter  Medications   amoxicillin (AMOXIL) 500 MG capsule    Sig: Take 1 capsule (500 mg total) by mouth 3 (three) times daily for 10 days.    Dispense:  30 capsule    Refill:  0  guaiFENesin (ROBITUSSIN) 100 MG/5ML liquid    Sig: Take 5 mLs by mouth every 4 (four) hours as needed for cough or to loosen phlegm.    Dispense:  120 mL    Refill:  0     Note: This dictation was prepared with Dragon dictation along with smaller phrase technology. Similar sounding words can be transcribed inadequately or may not be corrected upon review. Any transcriptional errors that result from this process are unintentional.      Disposition:  Follow up  Signed, Alvira Monday, FNP  07/24/2022 8:40 PM     [facility]

## 2022-07-30 ENCOUNTER — Other Ambulatory Visit: Payer: Self-pay | Admitting: Cardiology

## 2022-07-30 ENCOUNTER — Other Ambulatory Visit: Payer: Self-pay | Admitting: Psychiatry

## 2022-07-30 ENCOUNTER — Other Ambulatory Visit: Payer: Self-pay

## 2022-07-30 ENCOUNTER — Other Ambulatory Visit: Payer: Self-pay | Admitting: Family Medicine

## 2022-07-30 DIAGNOSIS — F330 Major depressive disorder, recurrent, mild: Secondary | ICD-10-CM

## 2022-07-31 ENCOUNTER — Telehealth: Payer: Self-pay | Admitting: Psychiatry

## 2022-07-31 ENCOUNTER — Other Ambulatory Visit (HOSPITAL_COMMUNITY): Payer: Self-pay

## 2022-07-31 ENCOUNTER — Other Ambulatory Visit: Payer: Self-pay

## 2022-07-31 MED ORDER — METOPROLOL TARTRATE 25 MG PO TABS
25.0000 mg | ORAL_TABLET | Freq: Two times a day (BID) | ORAL | 1 refills | Status: DC
  Filled 2022-07-31: qty 180, 90d supply, fill #0
  Filled 2023-01-16: qty 180, 90d supply, fill #1

## 2022-07-31 MED ORDER — ROSUVASTATIN CALCIUM 5 MG PO TABS
5.0000 mg | ORAL_TABLET | Freq: Every day | ORAL | 3 refills | Status: DC
  Filled 2022-07-31: qty 90, 90d supply, fill #0
  Filled 2022-10-22 – 2022-11-27 (×2): qty 90, 90d supply, fill #1

## 2022-07-31 MED ORDER — VENLAFAXINE HCL ER 75 MG PO CP24
75.0000 mg | ORAL_CAPSULE | Freq: Every day | ORAL | 0 refills | Status: DC
  Filled 2022-07-31: qty 90, 90d supply, fill #0

## 2022-07-31 MED ORDER — BENAZEPRIL HCL 5 MG PO TABS
5.0000 mg | ORAL_TABLET | Freq: Every day | ORAL | 3 refills | Status: DC
  Filled 2022-07-31: qty 90, 90d supply, fill #0
  Filled 2022-11-27: qty 90, 90d supply, fill #1

## 2022-07-31 NOTE — Telephone Encounter (Signed)
Patient called stating that she cannot refill the Rexulti 1 mg tablet. [place] suggested that she call our office to ask for prescription. Please advise.

## 2022-08-01 ENCOUNTER — Other Ambulatory Visit: Payer: Self-pay | Admitting: Psychiatry

## 2022-08-01 ENCOUNTER — Other Ambulatory Visit: Payer: Self-pay

## 2022-08-01 ENCOUNTER — Other Ambulatory Visit (HOSPITAL_COMMUNITY): Payer: Self-pay

## 2022-08-01 MED ORDER — BREXPIPRAZOLE 1 MG PO TABS
1.0000 mg | ORAL_TABLET | Freq: Every day | ORAL | 0 refills | Status: DC
  Filled 2022-08-01: qty 30, 30d supply, fill #0

## 2022-08-01 NOTE — Telephone Encounter (Signed)
Ordered

## 2022-08-01 NOTE — Telephone Encounter (Signed)
Pt.notified

## 2022-08-11 NOTE — Progress Notes (Unsigned)
Virtual Visit via Video Note  I connected with Mary Roman on 08/13/22 at  4:30 PM EST by a video enabled telemedicine application and verified that I am speaking with the correct person using two identifiers.  Location: Patient: home Provider: office Persons participated in the visit- patient, provider    I discussed the limitations of evaluation and management by telemedicine and the availability of in person appointments. The patient expressed understanding and agreed to proceed.    I discussed the assessment and treatment plan with the patient. The patient was provided an opportunity to ask questions and all were answered. The patient agreed with the plan and demonstrated an understanding of the instructions.   The patient was advised to call back or seek an in-person evaluation if the symptoms worsen or if the condition fails to improve as anticipated.  I provided 18 minutes of non-face-to-face time during this encounter.   [name], MD    [facility] MD/PA/NP OP Progress Note  08/13/2022 5:17 PM Mary Roman  MRN:  [id number]  Chief Complaint:  Chief Complaint  Patient presents with   Follow-up   HPI:  This is a follow-up appointment for depression and anxiety.  She states that she had the flu during Christmas season.  She was able to see her mother on Christmas, and visited her this year after she went to ED. her mother is at home, and has been doing better.  She continues to have conflict with her sister, who lives across the street from her mother.  The sister does not go there to see her mother.  However, she tried not to focus on things she cannot control.  Her brother lives with her mother, and has helping her when he can as he has at work.  She reports fair relationship with her fianc.  He is still working on diabetes management, with the hope to get knee replacement surgery.  She denies any concern at work.  She has middle insomnia.  She feels less anxious;  she takes Xanax up to 10 times per month.  She feels less depressed.  She denies change in appetite.  She denies SI.  She denies alcohol use or drug use.  Although she is interested in seeing Ms. Bynum, she would like to wait for another few days.  She feels comfortable to stay on the medication as it is for now.    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  one son, one 40 yo daughter  Visit Diagnosis:    ICD-10-CM   1. MDD (major depressive disorder), recurrent episode, mild ([place])  F33.0     2. GAD (generalized anxiety disorder)  F41.1     3. Insomnia, unspecified type  G47.00       Past Psychiatric History: Please see initial evaluation for full details. I have reviewed the history. No updates at this time.     Past Medical History:  Past Medical History:  Diagnosis Date   Absolute anemia 06/23/2018   Added automatically from request for surgery [id number]   Anemia    Anxiety    Backache 12/24/2007   Chronic back pain    Depression    Diabetes mellitus, type II ([place])    Headache(784.0)  Hypertension    Metabolic syndrome X 29/52/8413   Obesity    Palpitation    Sickle cell trait ([place])    Thyromegaly    Unspecified vitamin D deficiency 02/19/2013    Past Surgical History:  Procedure Laterality Date   COLONOSCOPY WITH PROPOFOL N/A 01/03/2021   Procedure: COLONOSCOPY WITH PROPOFOL;  Surgeon: Harvel Quale, MD;  [facility];  Service: Gastroenterology;  Laterality: N/A;  AM   DILATATION AND CURETTAGE/HYSTEROSCOPY WITH MINERVA N/A 03/25/2019   Procedure: DILATATION AND CURETTAGE/HYSTEROSCOPY WITH  ATTEMPTED MINERVA ENDOMETRIAL ABLATION;  Surgeon: Florian Buff, MD;  Location: [facility];  Service: Gynecology;   Laterality: N/A;   None      Family Psychiatric History: Please see initial evaluation for full details. I have reviewed the history. No updates at this time.     Family History:  Family History  Problem Relation Age of Onset   Diabetes Mother    Hypertension Mother    Stroke Mother    Colon cancer Mother    Alcohol abuse Mother    Cancer - Colon Mother 4   Pneumonia Father    Depression Sister    Hypertension Sister    Anxiety disorder Sister    Hypertension Sister    Leukemia Sister    Heart attack Maternal Uncle    Hypertension Maternal Grandfather    Diabetes Maternal Grandmother    Hypertension Maternal Grandmother    Depression Child     Social History:  Social History   Socioeconomic History   Marital status: Single    Spouse name: Not on file   Number of children: 2   Years of education: Not on file   Highest education level: Not on file  Occupational History   Occupation: employed in medical office   Tobacco Use   Smoking status: Never   Smokeless tobacco: Never  Vaping Use   Vaping Use: Never used  Substance and Sexual Activity   Alcohol use: No   Drug use: No   Sexual activity: Yes    Partners: Male    Birth control/protection: Other-see comments    Comment: bf had vasectomy  Other Topics Concern   Not on file  Social History Narrative   Not on file   Social Determinants of Health   Financial Resource Strain: Medium Risk (03/08/2022)   Overall Financial Resource Strain (CARDIA)    Difficulty of Paying Living Expenses: Somewhat hard  Food Insecurity: Unknown (03/08/2022)   Hunger Vital Sign    Worried About Running Out of Food in the Last Year: Patient refused    Ran Out of Food in the Last Year: Patient refused  Transportation Needs: No Transportation Needs (03/08/2022)   PRAPARE - Hydrologist (Medical): No    Lack of Transportation (Non-Medical): No  Physical Activity: Inactive (03/08/2022)   Exercise Vital  Sign    Days of Exercise per Week: 0 days    Minutes of Exercise per Session: 0 min  Stress: Stress Concern Present (03/08/2022)   [place]    Feeling of Stress : Very much  Social Connections: Moderately Isolated (03/08/2022)   Social Connection and Isolation Panel [NHANES]    Frequency of Communication with Friends and Family: Twice a week    Frequency of Social Gatherings with Friends and Family: Once a week    Attends Religious Services: Never    Marine scientist or Organizations:  No    Attends Archivist Meetings: Never    Marital Status: Living with partner    Allergies: No Known Allergies  Metabolic Disorder Labs: Lab Results  Component Value Date   HGBA1C 8.3 (H) 07/10/2022   MPG 174.29 04/10/2022   MPG 240.3 08/25/2021   No results found for: "PROLACTIN" Lab Results  Component Value Date   CHOL 118 02/05/2022   TRIG 55 02/05/2022   HDL 31 (L) 02/05/2022   CHOLHDL 3.8 02/05/2022   VLDL 11 02/05/2022   LDLCALC 76 02/05/2022   LDLCALC 68 08/25/2021   Lab Results  Component Value Date   TSH 1.564 08/25/2021   TSH 1.400 09/01/2020    Therapeutic Level Labs: No results found for: "LITHIUM" No results found for: "VALPROATE" No results found for: "CBMZ"  Current Medications: Current Outpatient Medications  Medication Sig Dispense Refill   acetaminophen (TYLENOL) 500 MG tablet Take 1,000 mg by mouth every 6 (six) hours as needed (pain).     ALPRAZolam (XANAX) 1 MG tablet Take one tablet every 3 days , if needed, for sleep 10 tablet 3   aspirin EC 81 MG tablet Take 1 tablet (81 mg total) by mouth daily. (Patient taking differently: Take 81 mg by mouth in the morning.) 90 tablet 3   benazepril (LOTENSIN) 5 MG tablet Take 1 tablet (5 mg total) by mouth daily. 90 tablet 3   blood glucose meter kit and supplies Dispense based on patient and insurance preference. Once daily testing dx  e11.9 1 each 0   Blood Pressure KIT Use to check blood pressure daily. 1 kit 0   brexpiprazole (REXULTI) 1 MG TABS tablet Take 1 tablet (1 mg total) by mouth daily. 30 tablet 0   buPROPion (WELLBUTRIN XL) 150 MG 24 hr tablet Take 3 tablets (450 mg total) by mouth daily. 270 tablet 1   empagliflozin (JARDIANCE) 10 MG TABS tablet Take 1 tablet (10 mg total) by mouth daily before breakfast. 90 tablet 0   glipiZIDE (GLUCOTROL XL) 10 MG 24 hr tablet Take 1 tablet (10 mg total) by mouth daily with breakfast. 90 tablet 1   guaiFENesin (ROBITUSSIN) 100 MG/5ML liquid Take 5 mLs by mouth every 4 (four) hours as needed for cough or to loosen phlegm. 120 mL 0   megestrol (MEGACE) 40 MG tablet TAKE 1 TABLET BY MOUTH IN THE MORNING 30 tablet 11   metoprolol tartrate (LOPRESSOR) 25 MG tablet Take 1 tablet (25 mg total) by mouth 2 (two) times daily. Please schedule appointment for future refills. Thank you 180 tablet 1   ondansetron (ZOFRAN) 4 MG tablet Take 1 tablet (4 mg total) by mouth daily if needed for nausea 20 tablet 1   potassium chloride SA (KLOR-CON M) 20 MEQ tablet Take 1 tablet (20 mEq total) by mouth daily. 30 tablet 3   rosuvastatin (CRESTOR) 5 MG tablet Take 1 tablet (5 mg total) by mouth daily. 90 tablet 3   tirzepatide (MOUNJARO) 10 MG/0.5ML Pen Inject 10 mg into the skin once a week. 2 mL 3   triamterene-hydrochlorothiazide (MAXZIDE-25) 37.5-25 MG tablet Take 1 tablet by mouth daily. 90 tablet 3   venlafaxine XR (EFFEXOR-XR) 150 MG 24 hr capsule Take 1 capsule (150 mg total) by mouth daily. Take along with 75 mg capsule for a total of 225 mg daily. 90 capsule 0   venlafaxine XR (EFFEXOR-XR) 75 MG 24 hr capsule Take 1 capsule (75 mg total) by mouth daily. Total of 225 mg  daily. Take along with 150 mg cap 90 capsule 0   zaleplon (SONATA) 10 MG capsule Take 1 capsule (10 mg total) by mouth at bedtime as needed for up to 10 days for sleep. 10 capsule 0   [START ON 08/30/2022] zaleplon (SONATA) 10 MG  capsule Take 1 capsule (10 mg total) by mouth at bedtime as needed for sleep. 30 capsule 1   No current facility-administered medications for this visit.     Musculoskeletal: Strength & Muscle Tone:  N/A Gait & Station:  N/A Patient leans: N/A  Psychiatric Specialty Exam: Review of Systems  Psychiatric/Behavioral:  Positive for sleep disturbance. Negative for agitation, behavioral problems, confusion, decreased concentration, dysphoric mood, hallucinations, self-injury and suicidal ideas. The patient is nervous/anxious. The patient is not hyperactive.   All other systems reviewed and are negative.   There were no vitals taken for this visit.There is no height or weight on file to calculate BMI.  General Appearance: Fairly Groomed  Eye Contact:  Good  Speech:  Clear and Coherent  Volume:  Normal  Mood:   good  Affect:  Appropriate, Congruent, and calm  Thought Process:  Coherent  Orientation:  Full (Time, Place, and Person)  Thought Content: Logical   Suicidal Thoughts:  No  Homicidal Thoughts:  No  Memory:  Immediate;   Good  Judgement:  Good  Insight:  Good  Psychomotor Activity:  Normal  Concentration:  Concentration: Good and Attention Span: Good  Recall:  Good  Fund of Knowledge: Good  Language: Good  Akathisia:  No  Handed:  Right  AIMS (if indicated): not done  Assets:  Communication Skills Desire for Improvement  ADL's:  Intact  Cognition: WNL  Sleep:  Poor   Screenings: GAD-7    Flowsheet Row Office Visit from 07/10/2022 in [facility] Visit from 03/08/2022 in [facility] Office Visit from 02/07/2022 in [facility] from 01/23/2022 in [facility] from 05/15/2018 in [facility]  Total GAD-7 Score '7 16 12 15 17      '$ PHQ2-9    Flowsheet Row Video Visit from 07/24/2022 in [facility] Office Visit from  07/10/2022 in [facility] Office Visit from 04/12/2022 in [facility] Office Visit from 03/08/2022 in [facility] Office Visit from 02/07/2022 in [facility]  PHQ-2 Total Score 0 '2 3 6 2  '$ PHQ-9 Total Score 0 '8 9 14 11      '$ Flowsheet Row Video Visit from 09/14/2021 in [facility] Video Visit from 08/10/2021 in [facility] Video Visit from 05/04/2021 in [facility] Error: Q3, 4, or 5 should not be populated when Q2 is No No Risk Low Risk        Assessment and Plan:  Mary Roman is a 52 y.o. year old female with a history of depression, mild OSA, diabetes, who presents for follow up appointment for below.   1. MDD (major depressive disorder), recurrent episode, mild ([place]) 2. GAD (generalized anxiety disorder) There has been overall improvement in depressive symptoms and anxiety since the last visit. Psychosocial stressors includes conflict among her siblings, her fianc with diabetes/pending knee replacement surgery, her mother with cancer, and financial strain.  Will continue current dose of venlafaxine, bupropion, rexulti to target depression.  She will greatly benefit from CBT.  She agreed to contact the office if she is  interested in having follow-up with Ms. Bynum.   3. Insomnia, unspecified type She continues to struggle with occasional middle insomnia.  According to the chart review, her cardiac has recommended CPAP machine, although she has not used it.  She agrees to contact her provider regarding this.  Will continue current dose of Sonata to target insomnia.     Plan (she will contact the office if she needs a refill) Continue venlafaxine 225 mg daily  Continue bupropion 450 mg daily  Continue rexulti 1 mg daily  Continue Sonata 10 mg at night as needed for insomnia Next appointment 3/18 at 4:30 for 30 mins, video (she  declined in person visit due to work schedule) - on gabapentin for pain and hydroxyzine - on Mounjaro - on xanax 1 mg. 10 tabs per month, prescribed by her PCP   Past trials of medication: fluoxetine, duloxetine, Abilify (weight gain),  buspirone, Trazodone ("weird" feeling) temazepam, Ambien, Lunesta, Xanax      The patient demonstrates the following risk factors for suicide: Chronic risk factors for suicide include: psychiatric disorder of depression, anxiety and chronic pain. Acute risk factors for suicide include: N/A. Protective factors for this patient include: positive social support, responsibility to others (children, family), coping skills and hope for the future. Considering these factors, the overall suicide risk at this point appears to be low. Patient is appropriate for outpatient follow up.      Collaboration of Care: Collaboration of Care: Other reviewed notes in Epic  Patient/Guardian was advised Release of Information must be obtained prior to any record release in order to collaborate their care with an outside provider. Patient/Guardian was advised if they have not already done so to contact the registration department to sign all necessary forms in order for [place] to release information regarding their care.   Consent: Patient/Guardian gives verbal consent for treatment and assignment of benefits for services provided during this visit. Patient/Guardian expressed understanding and agreed to proceed.    [name], MD 08/13/2022, 5:17 PM

## 2022-08-13 ENCOUNTER — Encounter: Payer: Self-pay | Admitting: Psychiatry

## 2022-08-13 ENCOUNTER — Other Ambulatory Visit (HOSPITAL_COMMUNITY): Payer: Self-pay

## 2022-08-13 ENCOUNTER — Telehealth: Payer: 59 | Admitting: Psychiatry

## 2022-08-13 DIAGNOSIS — F330 Major depressive disorder, recurrent, mild: Secondary | ICD-10-CM | POA: Diagnosis not present

## 2022-08-13 DIAGNOSIS — F411 Generalized anxiety disorder: Secondary | ICD-10-CM | POA: Diagnosis not present

## 2022-08-13 DIAGNOSIS — G4700 Insomnia, unspecified: Secondary | ICD-10-CM | POA: Diagnosis not present

## 2022-08-13 MED ORDER — ZALEPLON 10 MG PO CAPS
10.0000 mg | ORAL_CAPSULE | Freq: Every evening | ORAL | 1 refills | Status: DC | PRN
  Filled 2022-08-13 – 2022-09-05 (×2): qty 30, 30d supply, fill #0
  Filled 2022-10-08: qty 30, 30d supply, fill #1

## 2022-08-13 NOTE — Patient Instructions (Signed)
Continue venlafaxine 225 mg daily  Continue bupropion 450 mg daily  Continue rexulti 1 mg daily  Continue Sonata 10 mg at night as needed for insomnia Next appointment 3/18 at 4:30

## 2022-08-21 ENCOUNTER — Ambulatory Visit: Payer: 59

## 2022-08-22 ENCOUNTER — Ambulatory Visit: Payer: 59 | Admitting: Family Medicine

## 2022-09-03 ENCOUNTER — Other Ambulatory Visit (HOSPITAL_COMMUNITY): Payer: Self-pay

## 2022-09-05 ENCOUNTER — Other Ambulatory Visit: Payer: Self-pay

## 2022-09-05 ENCOUNTER — Other Ambulatory Visit (HOSPITAL_COMMUNITY): Payer: Self-pay

## 2022-09-11 ENCOUNTER — Other Ambulatory Visit: Payer: Self-pay | Admitting: Family Medicine

## 2022-09-12 ENCOUNTER — Ambulatory Visit: Payer: 59 | Admitting: Family Medicine

## 2022-09-21 ENCOUNTER — Other Ambulatory Visit: Payer: Self-pay | Admitting: Psychiatry

## 2022-09-21 ENCOUNTER — Other Ambulatory Visit (HOSPITAL_COMMUNITY): Payer: Self-pay

## 2022-09-21 ENCOUNTER — Other Ambulatory Visit: Payer: Self-pay

## 2022-09-21 MED ORDER — BUPROPION HCL ER (XL) 150 MG PO TB24
450.0000 mg | ORAL_TABLET | Freq: Every day | ORAL | 1 refills | Status: DC
  Filled 2022-09-21: qty 270, 90d supply, fill #0
  Filled 2023-01-16: qty 270, 90d supply, fill #1

## 2022-09-21 MED ORDER — BREXPIPRAZOLE 1 MG PO TABS
1.0000 mg | ORAL_TABLET | Freq: Every day | ORAL | 0 refills | Status: DC
  Filled 2022-09-21: qty 30, 30d supply, fill #0

## 2022-09-28 ENCOUNTER — Other Ambulatory Visit (HOSPITAL_COMMUNITY): Payer: Self-pay

## 2022-10-08 ENCOUNTER — Other Ambulatory Visit (HOSPITAL_COMMUNITY): Payer: Self-pay

## 2022-10-08 ENCOUNTER — Other Ambulatory Visit: Payer: Self-pay

## 2022-10-14 NOTE — Progress Notes (Unsigned)
Virtual Visit via Video Note  I connected with Mary Roman on 10/15/22 at  4:30 PM EDT by a video enabled telemedicine application and verified that I am speaking with the correct person using two identifiers.  Location: Patient: home Provider: office Persons participated in the visit- patient, provider    I discussed the limitations of evaluation and management by telemedicine and the availability of in person appointments. The patient expressed understanding and agreed to proceed.     I discussed the assessment and treatment plan with the patient. The patient was provided an opportunity to ask questions and all were answered. The patient agreed with the plan and demonstrated an understanding of the instructions.   The patient was advised to call back or seek an in-person evaluation if the symptoms worsen or if the condition fails to improve as anticipated.  I provided 15 minutes of non-face-to-face time during this encounter.   [name], MD    [facility] MD/PA/NP OP Progress Note  10/15/2022 5:05 PM Mary Roman  MRN:  [id number]  Chief Complaint:  Chief Complaint  Patient presents with   Follow-up   HPI:  This is a follow-up appointment for depression, anxiety and insomnia.  She states that her mother's cancer is progressing.  She will have a biopsy.  She and her sister is visiting her mother.  She has been busy at work.  She states that it is like a roller coaster due to shortage of staff.  She is concerned about her daughter, who tends to internalize things.  She also has a 14 year old son, who she hopes to be back to school.  Her fianc is not in a good mood partly due to knee pain.  She thinks he should get disability as he is unable to work due to pain.  She sleeps up to 6 hours.  She reports decreased appetite as she is on Mounjaro.  She tends to feel irritable, down.  She denies SI.  Although she feels anxious at times, she denies panic attacks.  She denies  alcohol use or drug use.  Although she knows it would be good to see Ms. Bynum, she has not contacted her.  She wants to stay on the current medication regimen at this time.    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  one son, one 27 yo daughter  Wt Readings from Last 3 Encounters:  07/10/22 194 lb (88 kg)  04/12/22 191 lb (86.6 kg)  03/08/22 188 lb (85.3 kg)     Visit Diagnosis:    ICD-10-CM   1. MDD (major depressive disorder), recurrent episode, mild ([place])  F33.0     2. GAD (generalized anxiety disorder)  F41.1     3. Insomnia, unspecified type  G47.00       Past Psychiatric History: Please see initial evaluation for full details. I have reviewed the history. No updates at this time.     Past Medical History:  Past Medical History:  Diagnosis Date   Absolute anemia 06/23/2018   Added automatically from request for surgery [id number]   Anemia    Anxiety    Backache 12/24/2007   Chronic back pain    Depression    Diabetes mellitus, type II ([place])    Headache(784.0)    Hypertension    Metabolic syndrome [id number]   Obesity    Palpitation    Sickle cell trait (  [place])    Thyromegaly    Unspecified vitamin D deficiency 02/19/2013    Past Surgical History:  Procedure Laterality Date   COLONOSCOPY WITH PROPOFOL N/A 01/03/2021   Procedure: COLONOSCOPY WITH PROPOFOL;  Surgeon: Harvel Quale, MD;  Location: [facility] END[facility] SUITE;  Service: Gastroenterology;  Laterality: N/A;  AM   DILATATION AND CURETTAGE/HYSTEROSCOPY WITH MINERVA N/A 03/25/2019   Procedure: DILATATION AND CURETTAGE/HYSTEROSCOPY WITH  ATTEMPTED MINERVA ENDOMETRIAL ABLATION;  Surgeon: Florian Buff, MD;  Location: [facility] ORS;  Service: Gynecology;  Laterality: N/A;   None      Family Psychiatric History: Please see initial evaluation for full details. I have reviewed the history. No  updates at this time.     Family History:  Family History  Problem Relation Age of Onset   Diabetes Mother    Hypertension Mother    Stroke Mother    Colon cancer Mother    Alcohol abuse Mother    Cancer - Colon Mother 71   Pneumonia Father    Depression Sister    Hypertension Sister    Anxiety disorder Sister    Hypertension Sister    Leukemia Sister    Heart attack Maternal Uncle    Hypertension Maternal Grandfather    Diabetes Maternal Grandmother    Hypertension Maternal Grandmother    Depression Child     Social History:  Social History   Socioeconomic History   Marital status: Single    Spouse name: Not on file   Number of children: 2   Years of education: Not on file   Highest education level: Not on file  Occupational History   Occupation: employed in medical office   Tobacco Use   Smoking status: Never   Smokeless tobacco: Never  Vaping Use   Vaping Use: Never used  Substance and Sexual Activity   Alcohol use: No   Drug use: No   Sexual activity: Yes    Partners: Male    Birth control/protection: Other-see comments    Comment: bf had vasectomy  Other Topics Concern   Not on file  Social History Narrative   Not on file   Social Determinants of Health   Financial Resource Strain: Medium Risk (03/08/2022)   Overall Financial Resource Strain (CARDIA)    Difficulty of Paying Living Expenses: Somewhat hard  Food Insecurity: Patient Declined (03/08/2022)   Hunger Vital Sign    Worried About Running Out of Food in the Last Year: Patient declined    [place] in the Last Year: Patient declined  Transportation Needs: No Transportation Needs (03/08/2022)   PRAPARE - Hydrologist (Medical): No    Lack of Transportation (Non-Medical): No  Physical Activity: Inactive (03/08/2022)   Exercise Vital Sign    Days of Exercise per Week: 0 days    Minutes of Exercise per Session: 0 min  Stress: Stress Concern Present (03/08/2022)    [place]    Feeling of Stress : Very much  Social Connections: Moderately Isolated (03/08/2022)   Social Connection and Isolation Panel [NHANES]    Frequency of Communication with Friends and Family: Twice a week    Frequency of Social Gatherings with Friends and Family: Once a week    Attends Religious Services: Never    Marine scientist or Organizations: No    Attends Archivist Meetings: Never    Marital Status: Living with partner  Allergies: No Known Allergies  Metabolic Disorder Labs: Lab Results  Component Value Date   HGBA1C 8.3 (H) 07/10/2022   MPG 174.29 04/10/2022   MPG 240.3 08/25/2021   No results found for: "PROLACTIN" Lab Results  Component Value Date   CHOL 118 02/05/2022   TRIG 55 02/05/2022   HDL 31 (L) 02/05/2022   CHOLHDL 3.8 02/05/2022   VLDL 11 02/05/2022   LDLCALC 76 02/05/2022   LDLCALC 68 08/25/2021   Lab Results  Component Value Date   TSH 1.564 08/25/2021   TSH 1.400 09/01/2020    Therapeutic Level Labs: No results found for: "LITHIUM" No results found for: "VALPROATE" No results found for: "CBMZ"  Current Medications: Current Outpatient Medications  Medication Sig Dispense Refill   acetaminophen (TYLENOL) 500 MG tablet Take 1,000 mg by mouth every 6 (six) hours as needed (pain).     ALPRAZolam (XANAX) 1 MG tablet Take one tablet every 3 days , if needed, for sleep 10 tablet 3   aspirin EC 81 MG tablet Take 1 tablet (81 mg total) by mouth daily. (Patient taking differently: Take 81 mg by mouth in the morning.) 90 tablet 3   benazepril (LOTENSIN) 5 MG tablet Take 1 tablet (5 mg total) by mouth daily. 90 tablet 3   blood glucose meter kit and supplies Dispense based on patient and insurance preference. Once daily testing dx e11.9 1 each 0   Blood Pressure KIT Use to check blood pressure daily. 1 kit 0   brexpiprazole (REXULTI) 1 MG TABS tablet Take 1  tablet (1 mg total) by mouth daily. 30 tablet 0   buPROPion (WELLBUTRIN XL) 150 MG 24 hr tablet Take 3 tablets (450 mg total) by mouth daily. 270 tablet 1   empagliflozin (JARDIANCE) 10 MG TABS tablet Take 1 tablet (10 mg total) by mouth daily before breakfast. 90 tablet 0   glipiZIDE (GLUCOTROL XL) 10 MG 24 hr tablet Take 1 tablet (10 mg total) by mouth daily with breakfast. 90 tablet 1   guaiFENesin (ROBITUSSIN) 100 MG/5ML liquid Take 5 mLs by mouth every 4 (four) hours as needed for cough or to loosen phlegm. 120 mL 0   megestrol (MEGACE) 40 MG tablet TAKE 1 TABLET BY MOUTH IN THE MORNING 30 tablet 11   metoprolol tartrate (LOPRESSOR) 25 MG tablet Take 1 tablet (25 mg total) by mouth 2 (two) times daily. Please schedule appointment for future refills. Thank you 180 tablet 1   ondansetron (ZOFRAN) 4 MG tablet TAKE 1 TABLET BY MOUTH ONCE DAILY AS NEEDED FOR NAUSEA 20 tablet 0   potassium chloride SA (KLOR-CON M) 20 MEQ tablet Take 1 tablet (20 mEq total) by mouth daily. 30 tablet 3   rosuvastatin (CRESTOR) 5 MG tablet Take 1 tablet (5 mg total) by mouth daily. 90 tablet 3   tirzepatide (MOUNJARO) 10 MG/0.5ML Pen Inject 10 mg into the skin once a week. 2 mL 3   triamterene-hydrochlorothiazide (MAXZIDE-25) 37.5-25 MG tablet Take 1 tablet by mouth daily. 90 tablet 3   venlafaxine XR (EFFEXOR-XR) 150 MG 24 hr capsule Take 1 capsule (150 mg total) by mouth daily. Take along with 75 mg capsule for a total of 225 mg daily. 90 capsule 0   venlafaxine XR (EFFEXOR-XR) 75 MG 24 hr capsule Take 1 capsule (75 mg total) by mouth daily. Total of 225 mg daily. Take along with 150 mg cap 90 capsule 0   zaleplon (SONATA) 10 MG capsule Take 1 capsule (10 mg total)  by mouth at bedtime as needed for up to 10 days for sleep. 10 capsule 0   [START ON 11/07/2022] zaleplon (SONATA) 10 MG capsule Take 1 capsule (10 mg total) by mouth at bedtime as needed for sleep. 30 capsule 1   No current facility-administered medications  for this visit.     Musculoskeletal: Strength & Muscle Tone:  N/A Gait & Station:  N/A Patient leans: N/A  Psychiatric Specialty Exam: Review of Systems  Psychiatric/Behavioral:  Positive for dysphoric mood and sleep disturbance. Negative for agitation, behavioral problems, confusion, decreased concentration, hallucinations, self-injury and suicidal ideas. The patient is nervous/anxious. The patient is not hyperactive.   All other systems reviewed and are negative.   There were no vitals taken for this visit.There is no height or weight on file to calculate BMI.  General Appearance: Fairly Groomed  Eye Contact:  Good  Speech:  Clear and Coherent  Volume:  Normal  Mood:   fine  Affect:  Appropriate, Congruent, and slightly down  Thought Process:  Coherent  Orientation:  Full (Time, Place, and Person)  Thought Content: Logical   Suicidal Thoughts:  No  Homicidal Thoughts:  No  Memory:  Immediate;   Good  Judgement:  Good  Insight:  Good  Psychomotor Activity:  Normal  Concentration:  Concentration: Good and Attention Span: Good  Recall:  Good  Fund of Knowledge: Good  Language: Good  Akathisia:  No  Handed:  Right  AIMS (if indicated): not done  Assets:  Communication Skills Desire for Improvement  ADL's:  Intact  Cognition: WNL  Sleep:  Fair   Screenings: GAD-7    [place] Office Visit from 07/10/2022 in [facility] Office Visit from 03/08/2022 in [facility] for [place] [facility] [place] Visit from 02/07/2022 in [place] from 01/23/2022 in [place] [facility] [place] from 05/15/2018 in [place] [facility] [place]  Total GAD-7 Score 7 16 12 15 17       PHQ2-9    Flowsheet Row Video Visit from 07/24/2022 in [facility] Visit from 07/10/2022 in [facility]  Visit from 04/12/2022 in [facility] Office Visit from 03/08/2022 in [facility] for [place] [facility] [place] Visit from 02/07/2022 in [facility]  PHQ-2 Total Score 0 2 3 6 2   PHQ-9 Total Score 0 8 9 14 11       Flowsheet Row Video Visit from 09/14/2021 in [place] Video Visit from 08/10/2021 in [place] Video Visit from 05/04/2021 in [place] Error: Q3, 4, or 5 should not be populated when Q2 is No No Risk Low Risk        Assessment and Plan:  Mary Roman is a 52 y.o. year old female with a history of depression, mild OSA, diabetes, who presents for follow up appointment for below.   1. MDD (major depressive disorder), recurrent episode, mild ([place]) 2. GAD (generalized anxiety disorder) Acute stressors include: her mother with cancer, proceeding with biopsy, her fiancee with pending knee replacement due to diabetes, work related stress due to shortage of staff  Other stressors include: financial strain, difficulty in communication with her daughter, conflict with her siblings   History:   Although she continues to report  depression, anxiety and irritability, it has been overall manageable since last visit.  Will continue current dose of venlafaxine, bupropion, rexulti to target depression.  Although it was advised to reconnect with Ms. Bynum for therapy, she did not pursue this, and Ms. Bynum is not taking any new patient at this time.   3. Insomnia, unspecified type Improving.  Will continue Sonata to target insomnia. According to the chart review, her cardiac has recommended CPAP machine, although she has not used it.  She agrees to contact her provider regarding this. Will follow up on this at her next visit.     Plan (she will contact the office if she needs a  refill) Continue venlafaxine 225 mg daily - it was confirmed with the patient that she has refills Continue bupropion 450 mg daily  Continue rexulti 1 mg daily  Continue Sonata 10 mg at night as needed for insomnia Next appointment 5/15 at 4:30 for 30 mins, video (she declined in person visit due to work schedule) - on [employer] - on xanax 1 mg. 10 tabs per month, prescribed by her PCP   Past trials of medication: fluoxetine, duloxetine, Abilify (weight gain),  buspirone, Trazodone ("weird" feeling) temazepam, Ambien, Lunesta, Xanax      The patient demonstrates the following risk factors for suicide: Chronic risk factors for suicide include: psychiatric disorder of depression, anxiety and chronic pain. Acute risk factors for suicide include: N/A. Protective factors for this patient include: positive social support, responsibility to others (children, family), coping skills and hope for the future. Considering these factors, the overall suicide risk at this point appears to be low. Patient is appropriate for outpatient follow up.      Collaboration of Care: Collaboration of Care: Other reviewed notes in Epic  Patient/Guardian was advised Release of Information must be obtained prior to any record release in order to collaborate their care with an outside provider. Patient/Guardian was advised if they have not already done so to contact the registration department to sign all necessary forms in order for [place] to release information regarding their care.   Consent: Patient/Guardian gives verbal consent for treatment and assignment of benefits for services provided during this visit. Patient/Guardian expressed understanding and agreed to proceed.    [name], MD 10/15/2022, 5:05 PM

## 2022-10-15 ENCOUNTER — Telehealth (INDEPENDENT_AMBULATORY_CARE_PROVIDER_SITE_OTHER): Payer: 59 | Admitting: Psychiatry

## 2022-10-15 ENCOUNTER — Encounter: Payer: Self-pay | Admitting: Psychiatry

## 2022-10-15 DIAGNOSIS — G4700 Insomnia, unspecified: Secondary | ICD-10-CM | POA: Diagnosis not present

## 2022-10-15 DIAGNOSIS — F330 Major depressive disorder, recurrent, mild: Secondary | ICD-10-CM

## 2022-10-15 DIAGNOSIS — F411 Generalized anxiety disorder: Secondary | ICD-10-CM

## 2022-10-15 LAB — HM DIABETES EYE EXAM

## 2022-10-15 MED ORDER — ZALEPLON 10 MG PO CAPS
10.0000 mg | ORAL_CAPSULE | Freq: Every evening | ORAL | 1 refills | Status: DC | PRN
  Filled 2022-10-15 – 2022-11-05 (×2): qty 30, 30d supply, fill #0
  Filled 2022-12-04: qty 30, 30d supply, fill #1

## 2022-10-15 NOTE — Patient Instructions (Signed)
Continue venlafaxine 225 mg daily  Continue bupropion 450 mg daily  Continue rexulti 1 mg daily  Continue Sonata 10 mg at night as needed for insomnia Next appointment 5/15 at 4:30

## 2022-10-16 ENCOUNTER — Other Ambulatory Visit (HOSPITAL_COMMUNITY): Payer: Self-pay

## 2022-10-16 ENCOUNTER — Other Ambulatory Visit: Payer: Self-pay

## 2022-10-17 ENCOUNTER — Ambulatory Visit (INDEPENDENT_AMBULATORY_CARE_PROVIDER_SITE_OTHER): Payer: 59 | Admitting: Family Medicine

## 2022-10-17 ENCOUNTER — Encounter: Payer: Self-pay | Admitting: Family Medicine

## 2022-10-17 ENCOUNTER — Other Ambulatory Visit (HOSPITAL_COMMUNITY): Payer: Self-pay

## 2022-10-17 VITALS — BP 128/74 | HR 84 | Ht 65.0 in | Wt 192.1 lb

## 2022-10-17 DIAGNOSIS — Z23 Encounter for immunization: Secondary | ICD-10-CM | POA: Diagnosis not present

## 2022-10-17 DIAGNOSIS — I10 Essential (primary) hypertension: Secondary | ICD-10-CM | POA: Diagnosis not present

## 2022-10-17 DIAGNOSIS — F411 Generalized anxiety disorder: Secondary | ICD-10-CM | POA: Diagnosis not present

## 2022-10-17 DIAGNOSIS — F331 Major depressive disorder, recurrent, moderate: Secondary | ICD-10-CM

## 2022-10-17 DIAGNOSIS — E1159 Type 2 diabetes mellitus with other circulatory complications: Secondary | ICD-10-CM

## 2022-10-17 DIAGNOSIS — F5104 Psychophysiologic insomnia: Secondary | ICD-10-CM | POA: Diagnosis not present

## 2022-10-17 DIAGNOSIS — E785 Hyperlipidemia, unspecified: Secondary | ICD-10-CM

## 2022-10-17 DIAGNOSIS — E669 Obesity, unspecified: Secondary | ICD-10-CM | POA: Diagnosis not present

## 2022-10-17 MED ORDER — TIRZEPATIDE 7.5 MG/0.5ML ~~LOC~~ SOAJ
7.5000 mg | SUBCUTANEOUS | 0 refills | Status: DC
  Filled 2022-10-17: qty 2, 28d supply, fill #0
  Filled 2022-12-04: qty 2, 28d supply, fill #1
  Filled 2022-12-27: qty 2, 28d supply, fill #2

## 2022-10-17 NOTE — Patient Instructions (Addendum)
F/U in 3.5 months, call if you need me sooner  TdAP today  HBa1C, cmp  and EGFR today, lipid and TSH today, pls respond to My Chart message  Stay on low dose of Mounjaro, new script sent for 7.5 mg  weekly, pls call pharmacy to confirm   I will message Dr Modesta Messing, re xanax, I will not continue to prescribe unless directed by her to do so  Thanks for choosing [facility], we consider it a privelige to serve you.

## 2022-10-18 ENCOUNTER — Other Ambulatory Visit (HOSPITAL_COMMUNITY): Payer: Self-pay

## 2022-10-18 ENCOUNTER — Other Ambulatory Visit: Payer: Self-pay

## 2022-10-18 ENCOUNTER — Encounter: Payer: Self-pay | Admitting: Family Medicine

## 2022-10-18 LAB — HEMOGLOBIN A1C
Est. average glucose Bld gHb Est-mCnc: 223 mg/dL
Hgb A1c MFr Bld: 9.4 % — ABNORMAL HIGH (ref 4.8–5.6)

## 2022-10-18 LAB — CMP14+EGFR
ALT: 16 IU/L (ref 0–32)
AST: 14 IU/L (ref 0–40)
Albumin/Globulin Ratio: 1.5 (ref 1.2–2.2)
Albumin: 4.3 g/dL (ref 3.8–4.9)
Alkaline Phosphatase: 94 IU/L (ref 44–121)
BUN/Creatinine Ratio: 7 — ABNORMAL LOW (ref 9–23)
BUN: 8 mg/dL (ref 6–24)
Bilirubin Total: 0.5 mg/dL (ref 0.0–1.2)
CO2: 22 mmol/L (ref 20–29)
Calcium: 10.1 mg/dL (ref 8.7–10.2)
Chloride: 102 mmol/L (ref 96–106)
Creatinine, Ser: 1.18 mg/dL — ABNORMAL HIGH (ref 0.57–1.00)
Globulin, Total: 2.9 g/dL (ref 1.5–4.5)
Glucose: 89 mg/dL (ref 70–99)
Potassium: 3.6 mmol/L (ref 3.5–5.2)
Sodium: 140 mmol/L (ref 134–144)
Total Protein: 7.2 g/dL (ref 6.0–8.5)
eGFR: 56 mL/min/{1.73_m2} — ABNORMAL LOW (ref >59)

## 2022-10-18 LAB — LIPID PANEL
Chol/HDL Ratio: 3.8 ratio (ref 0.0–4.4)
Cholesterol, Total: 122 mg/dL (ref 100–199)
HDL: 32 mg/dL — ABNORMAL LOW (ref >39)
LDL Chol Calc (NIH): 74 mg/dL (ref 0–99)
Triglycerides: 77 mg/dL (ref 0–149)
VLDL Cholesterol Cal: 16 mg/dL (ref 5–40)

## 2022-10-18 LAB — TSH: TSH: 0.924 u[IU]/mL (ref 0.450–4.500)

## 2022-10-18 MED ORDER — GLIPIZIDE ER 10 MG PO TB24
20.0000 mg | ORAL_TABLET | Freq: Every morning | ORAL | 1 refills | Status: DC
  Filled 2022-10-18: qty 180, fill #0
  Filled 2022-10-22 – 2022-12-25 (×4): qty 180, 90d supply, fill #0
  Filled 2023-06-11 – 2023-07-31 (×2): qty 180, 90d supply, fill #1

## 2022-10-19 ENCOUNTER — Ambulatory Visit: Payer: 59 | Admitting: Family Medicine

## 2022-10-20 ENCOUNTER — Other Ambulatory Visit (HOSPITAL_COMMUNITY): Payer: Self-pay

## 2022-10-20 ENCOUNTER — Encounter: Payer: Self-pay | Admitting: Family Medicine

## 2022-10-20 DIAGNOSIS — F411 Generalized anxiety disorder: Secondary | ICD-10-CM | POA: Insufficient documentation

## 2022-10-20 NOTE — Assessment & Plan Note (Signed)
Improved, reports needing to have limited xanax available for as needed, use and states she d/w with Psych,  will send msg to Psych to determine what the plan is as I have in the past prescribed xanax

## 2022-10-20 NOTE — Progress Notes (Signed)
Mary Roman     MRN: [id number]      DOB: 1971-06-11   HPI Mary Roman is here for follow up and re-evaluation of chronic medical conditions, medication management and review of any available recent lab and radiology data.  Preventive health is updated, specifically  Cancer screening and Immunization.   Questions or concerns regarding consultations or procedures which the PT has had in the interim are  addressed. The PT denies any adverse reactions to current medications since the last visit.  Has not been testing blood sugar and believes that her numbers are high, still eating unhealthy snacks and no regular exercise , trying to do better , still struggling  C/o nausea with Mounjaro, will stay on low dose ROS Denies recent fever or chills. Denies sinus pressure, nasal congestion, ear pain or sore throat. Denies chest congestion, productive cough or wheezing. Denies chest pains, palpitations and leg swelling Denies abdominal pain, nausea, vomiting,diarrhea or constipation.   Denies dysuria, frequency, hesitancy or incontinence. Denies joint pain, swelling and limitation in mobility. Denies headaches, seizures, numbness, or tingling. Denies uncontrolled depression, anxiety or insomnia. States Psych OK with xanax  as dosed , will reach out directly to determine who will prescribe Denies skin break down or rash.   PE  BP 128/74 (BP Location: Right Arm, Patient Position: Sitting, Cuff Size: Large)   Pulse 84   Ht 5\' 5"  (1.651 m)   Wt 192 lb 1.9 oz (87.1 kg)   SpO2 97%   BMI 31.97 kg/m   Patient alert and oriented and in no cardiopulmonary distress.  HEENT: No facial asymmetry, EOMI,     Neck supple .  Chest: Clear to auscultation bilaterally.  CVS: S1, S2 no murmurs, no S3.Regular rate.  ABD: Soft non tender.   Ext: No edema  MS: Adequate ROM spine, shoulders, hips and knees.  Skin: Intact, no ulcerations or rash noted.  Psych: Good eye contact, normal  affect. Memory intact not anxious or depressed appearing.  CNS: CN 2-12 intact, power,  normal throughout.no focal deficits noted.   Assessment & Plan  Type 2 diabetes mellitus with vascular disease ([place]) Mary Roman is reminded of the importance of commitment to daily physical activity for 30 minutes or more, as able and the need to limit carbohydrate intake to 30 to 60 grams per meal to help with blood sugar control.   The need to take medication as prescribed, test blood sugar as directed, and to call between visits if there is a concern that blood sugar is uncontrolled is also discussed.   Mary Roman is reminded of the importance of daily foot exam, annual eye examination, and good blood sugar, blood pressure and cholesterol control.     Latest Ref Rng & Units 10/17/2022    4:17 PM 07/10/2022    3:28 PM 04/12/2022    3:16 PM 04/10/2022    8:33 AM 02/05/2022    8:32 AM  Diabetic Labs  HbA1c 4.8 - 5.6 % 9.4  8.3   7.7    Micro/Creat Ratio 0 - 29 mg/g creat   5     Chol 100 - 199 mg/dL 122     118   HDL >39 mg/dL 32     31   Calc LDL 0 - 99 mg/dL 74     76   Triglycerides 0 - 149 mg/dL 77     55   Creatinine 0.57 - 1.00 mg/dL 1.18  1.38   1.08  1.02       10/17/2022    2:59 PM 07/10/2022    3:03 PM 07/10/2022    3:02 PM 07/10/2022    2:22 PM 04/12/2022    2:32 PM 03/08/2022    8:36 AM 02/07/2022    3:06 PM  BP/Weight  Systolic BP 0000000 [id number]  Diastolic BP 74 88 90 84 65 74 88  Wt. (Lbs) 192.12   194 191 188   BMI 31.97 kg/m2   32.28 kg/m2 31.78 kg/m2 31.28 kg/m2       Latest Ref Rng & Units 07/10/2022    2:20 PM 11/30/2021   12:00 AM  Foot/eye exam completion dates  Eye Exam No Retinopathy  No Retinopathy      Foot Form Completion  Done      This result is from an external source.      Uncontrolled, increase glipizide to 20 mg daily  Dyslipidemia (high LDL; low HDL) Hyperlipidemia:Low fat diet discussed and encouraged.   Lipid Panel  Lab  Results  Component Value Date   CHOL 122 10/17/2022   HDL 32 (L) 10/17/2022   LDLCALC 74 10/17/2022   TRIG 77 10/17/2022   CHOLHDL 3.8 10/17/2022     Needs to increase exercise  Insomnia Managed by Psych,  Obesity (BMI 30.0-34.9)  Patient re-educated about  the importance of commitment to a  minimum of 150 minutes of exercise per week as able.  The importance of healthy food choices with portion control discussed, as well as eating regularly and within a 12 hour window most days. The need to choose "clean , green" food 50 to 75% of the time is discussed, as well as to make water the primary drink and set a goal of 64 ounces water daily.       10/17/2022    2:59 PM 07/10/2022    2:22 PM 04/12/2022    2:32 PM  Weight /BMI  Weight 192 lb 1.9 oz 194 lb 191 lb  Height 5\' 5"  (1.651 m) 5\' 5"  (1.651 m) 5\' 5"  (1.651 m)  BMI 31.97 kg/m2 32.28 kg/m2 31.78 kg/m2    Unchanged  Major depressive disorder, recurrent episode, moderate (HCC) Improved significantly, managed byPsych  GAD (generalized anxiety disorder) Improved, reports needing to have limited xanax available for as needed, use and states she d/w with Psych,  will send msg to Psych to determine what the plan is as I have in the past prescribed xanax

## 2022-10-20 NOTE — Assessment & Plan Note (Signed)
Improved significantly, managed byPsych

## 2022-10-20 NOTE — Assessment & Plan Note (Signed)
  Patient re-educated about  the importance of commitment to a  minimum of 150 minutes of exercise per week as able.  The importance of healthy food choices with portion control discussed, as well as eating regularly and within a 12 hour window most days. The need to choose "clean , green" food 50 to 75% of the time is discussed, as well as to make water the primary drink and set a goal of 64 ounces water daily.       10/17/2022    2:59 PM 07/10/2022    2:22 PM 04/12/2022    2:32 PM  Weight /BMI  Weight 192 lb 1.9 oz 194 lb 191 lb  Height 5\' 5"  (1.651 m) 5\' 5"  (1.651 m) 5\' 5"  (1.651 m)  BMI 31.97 kg/m2 32.28 kg/m2 31.78 kg/m2    Unchanged

## 2022-10-20 NOTE — Assessment & Plan Note (Signed)
Managed by Psych,

## 2022-10-20 NOTE — Assessment & Plan Note (Signed)
Hyperlipidemia:Low fat diet discussed and encouraged.   Lipid Panel  Lab Results  Component Value Date   CHOL 122 10/17/2022   HDL 32 (L) 10/17/2022   LDLCALC 74 10/17/2022   TRIG 77 10/17/2022   CHOLHDL 3.8 10/17/2022     Needs to increase exercise

## 2022-10-20 NOTE — Assessment & Plan Note (Signed)
Mary Roman is reminded of the importance of commitment to daily physical activity for 30 minutes or more, as able and the need to limit carbohydrate intake to 30 to 60 grams per meal to help with blood sugar control.   The need to take medication as prescribed, test blood sugar as directed, and to call between visits if there is a concern that blood sugar is uncontrolled is also discussed.   Mary Roman is reminded of the importance of daily foot exam, annual eye examination, and good blood sugar, blood pressure and cholesterol control.     Latest Ref Rng & Units 10/17/2022    4:17 PM 07/10/2022    3:28 PM 04/12/2022    3:16 PM 04/10/2022    8:33 AM 02/05/2022    8:32 AM  Diabetic Labs  HbA1c 4.8 - 5.6 % 9.4  8.3   7.7    Micro/Creat Ratio 0 - 29 mg/g creat   5     Chol 100 - 199 mg/dL 122     118   HDL >39 mg/dL 32     31   Calc LDL 0 - 99 mg/dL 74     76   Triglycerides 0 - 149 mg/dL 77     55   Creatinine 0.57 - 1.00 mg/dL 1.18  1.38   1.08  1.02       10/17/2022    2:59 PM 07/10/2022    3:03 PM 07/10/2022    3:02 PM 07/10/2022    2:22 PM 04/12/2022    2:32 PM 03/08/2022    8:36 AM 02/07/2022    3:06 PM  BP/Weight  Systolic BP 0000000 [id number]  Diastolic BP 74 88 90 84 65 74 88  Wt. (Lbs) 192.12   194 191 188   BMI 31.97 kg/m2   32.28 kg/m2 31.78 kg/m2 31.28 kg/m2       Latest Ref Rng & Units 07/10/2022    2:20 PM 11/30/2021   12:00 AM  Foot/eye exam completion dates  Eye Exam No Retinopathy  No Retinopathy      Foot Form Completion  Done      This result is from an external source.      Uncontrolled, increase glipizide to 20 mg daily

## 2022-10-22 ENCOUNTER — Other Ambulatory Visit: Payer: Self-pay

## 2022-10-22 ENCOUNTER — Other Ambulatory Visit (HOSPITAL_BASED_OUTPATIENT_CLINIC_OR_DEPARTMENT_OTHER): Payer: Self-pay

## 2022-10-22 ENCOUNTER — Other Ambulatory Visit: Payer: Self-pay | Admitting: Family Medicine

## 2022-10-22 ENCOUNTER — Other Ambulatory Visit (HOSPITAL_COMMUNITY): Payer: Self-pay

## 2022-10-22 ENCOUNTER — Other Ambulatory Visit: Payer: Self-pay | Admitting: Psychiatry

## 2022-10-22 DIAGNOSIS — F330 Major depressive disorder, recurrent, mild: Secondary | ICD-10-CM

## 2022-10-22 MED ORDER — EMPAGLIFLOZIN 10 MG PO TABS
10.0000 mg | ORAL_TABLET | Freq: Every day | ORAL | 0 refills | Status: DC
  Filled 2022-10-22: qty 90, 90d supply, fill #0

## 2022-10-22 MED ORDER — BREXPIPRAZOLE 1 MG PO TABS
1.0000 mg | ORAL_TABLET | Freq: Every day | ORAL | 0 refills | Status: AC
  Filled 2022-10-22: qty 90, 90d supply, fill #0

## 2022-10-22 MED ORDER — VENLAFAXINE HCL ER 75 MG PO CP24
75.0000 mg | ORAL_CAPSULE | Freq: Every day | ORAL | 1 refills | Status: DC
  Filled 2022-10-22: qty 90, 90d supply, fill #0
  Filled 2023-07-31: qty 90, 90d supply, fill #1

## 2022-10-22 MED ORDER — VENLAFAXINE HCL ER 150 MG PO CP24
150.0000 mg | ORAL_CAPSULE | Freq: Every day | ORAL | 1 refills | Status: DC
  Filled 2022-10-22: qty 90, 90d supply, fill #0
  Filled 2023-07-31: qty 90, 90d supply, fill #1

## 2022-10-23 ENCOUNTER — Other Ambulatory Visit: Payer: Self-pay

## 2022-10-23 ENCOUNTER — Ambulatory Visit: Payer: 59 | Admitting: Family Medicine

## 2022-10-29 ENCOUNTER — Encounter: Payer: Self-pay | Admitting: Family Medicine

## 2022-11-05 ENCOUNTER — Other Ambulatory Visit (HOSPITAL_COMMUNITY): Payer: Self-pay

## 2022-11-05 ENCOUNTER — Telehealth: Payer: 59 | Admitting: Physician Assistant

## 2022-11-05 DIAGNOSIS — L03019 Cellulitis of unspecified finger: Secondary | ICD-10-CM

## 2022-11-05 MED ORDER — CEPHALEXIN 500 MG PO CAPS: 500.0000 mg | ORAL_CAPSULE | Freq: Four times a day (QID) | ORAL | 0 refills | Status: AC

## 2022-11-05 NOTE — Progress Notes (Signed)
I have spent 5 minutes in review of e-visit questionnaire, review and updating patient chart, medical decision making and response to patient.   Lochlann Mastrangelo Cody Dreyton Roessner, PA-C    

## 2022-11-05 NOTE — Progress Notes (Signed)
E Visit for Cellulitis  We are sorry that you are not feeling well. Here is how we plan to help!  Based on what you shared with me it looks like you have cellulitis.  Cellulitis looks like areas of skin redness, swelling, and warmth; it develops as a result of bacteria entering under the skin. Little red spots and/or bleeding can be seen in skin, and tiny surface sacs containing fluid can occur. Fever can be present. Cellulitis is almost always on one side of a body, and the lower limbs are the most common site of involvement.   I have prescribed:  Keflex 500mg  take one by mouth four times a day for 5 days. I want you to keep skin clean and dry. Avoid popping the pustules. If not quickly improving and resolving, you need an in-person evaluation.  Giving this has occurred before, I would recommend a follow-up in person with your PCP.  HOME CARE:  Take your medications as ordered and take all of them, even if the skin irritation appears to be healing.   GET HELP RIGHT AWAY IF:  Symptoms that don't begin to go away within 48 hours. Severe redness persists or worsens If the area turns color, spreads or swells. If it blisters and opens, develops yellow-brown crust or bleeds. You develop a fever or chills. If the pain increases or becomes unbearable.  Are unable to keep fluids and food down.  MAKE SURE YOU   Understand these instructions. Will watch your condition. Will get help right away if you are not doing well or get worse.  Thank you for choosing an e-visit.  Your e-visit answers were reviewed by a board certified advanced clinical practitioner to complete your personal care plan. Depending upon the condition, your plan could have included both over the counter or prescription medications.  Please review your pharmacy choice. Make sure the pharmacy is open so you can pick up prescription now. If there is a problem, you may contact your provider through [employer] and have  the prescription routed to another pharmacy.  Your safety is important to [place]. If you have drug allergies check your prescription carefully.   For the next 24 hours you can use MyChart to ask questions about today's visit, request a non-urgent call back, or ask for a work or school excuse. You will get an email in the next two days asking about your experience. I hope that your e-visit has been valuable and will speed your recovery.

## 2022-11-11 MED ORDER — ALPRAZOLAM 1 MG PO TABS: ORAL_TABLET | ORAL | 0 refills | Status: DC

## 2022-11-19 ENCOUNTER — Other Ambulatory Visit (HOSPITAL_COMMUNITY): Payer: Self-pay | Admitting: Family Medicine

## 2022-11-19 ENCOUNTER — Ambulatory Visit (HOSPITAL_COMMUNITY)
Admission: RE | Admit: 2022-11-19 | Discharge: 2022-11-19 | Disposition: A | Discharge status: Home / Self Care | Payer: 59 | Source: Ambulatory Visit | Attending: Family Medicine | Admitting: Family Medicine

## 2022-11-19 DIAGNOSIS — Z1231 Encounter for screening mammogram for malignant neoplasm of breast: Secondary | ICD-10-CM | POA: Insufficient documentation

## 2022-11-27 ENCOUNTER — Other Ambulatory Visit (HOSPITAL_COMMUNITY): Payer: Self-pay

## 2022-11-29 ENCOUNTER — Ambulatory Visit: Payer: 59 | Admitting: Family Medicine

## 2022-12-04 ENCOUNTER — Other Ambulatory Visit (HOSPITAL_COMMUNITY): Payer: Self-pay

## 2022-12-04 ENCOUNTER — Other Ambulatory Visit: Payer: Self-pay

## 2022-12-12 ENCOUNTER — Telehealth: Payer: 59 | Admitting: Psychiatry

## 2022-12-17 ENCOUNTER — Telehealth: Payer: 59 | Admitting: Nurse Practitioner

## 2022-12-17 DIAGNOSIS — R051 Acute cough: Secondary | ICD-10-CM

## 2022-12-17 MED ORDER — FLUTICASONE PROPIONATE 50 MCG/ACT NA SUSP
2.0000 | Freq: Every day | NASAL | 6 refills | Status: AC
Start: 2022-12-17 — End: ?

## 2022-12-17 MED ORDER — BENZONATATE 100 MG PO CAPS
100.0000 mg | ORAL_CAPSULE | Freq: Three times a day (TID) | ORAL | 0 refills | Status: DC | PRN
Start: 2022-12-17 — End: 2023-06-06

## 2022-12-17 NOTE — Progress Notes (Signed)
E-Visit for Upper Respiratory Infection   We are sorry you are not feeling well.  Here is how we plan to help!  Based on what you have shared with me, it looks like you may have a viral upper respiratory infection.  Upper respiratory infections are caused by a large number of viruses; however, rhinovirus is the most common cause.   Symptoms vary from person to person, with common symptoms including sore throat, cough, fatigue or lack of energy and feeling of general discomfort.  A low-grade fever of up to 100.4 may present, but is often uncommon.  Symptoms vary however, and are closely related to a person's age or underlying illnesses.  The most common symptoms associated with an upper respiratory infection are nasal discharge or congestion, cough, sneezing, headache and pressure in the ears and face.  These symptoms usually persist for about 3 to 10 days, but can last up to 2 weeks.  It is important to know that upper respiratory infections do not cause serious illness or complications in most cases.    Upper respiratory infections can be transmitted from person to person, with the most common method of transmission being a person's hands.  The virus is able to live on the skin and can infect other persons for up to 2 hours after direct contact.  Also, these can be transmitted when someone coughs or sneezes; thus, it is important to cover the mouth to reduce this risk.  To keep the spread of the illness at bay, good hand hygiene is very important.  This is an infection that is most likely caused by a virus. There are no specific treatments other than to help you with the symptoms until the infection runs its course.  We are sorry you are not feeling well.  Here is how we plan to help!   For nasal congestion, you may use an oral decongestants such as Mucinex D or if you have glaucoma or high blood pressure use plain Mucinex.  Saline nasal spray or nasal drops can help and can safely be used as often as  needed for congestion.  For your congestion, I have prescribed Fluticasone nasal spray one spray in each nostril twice a day  If you do not have a history of heart disease, hypertension, diabetes or thyroid disease, prostate/bladder issues or glaucoma, you may also use Sudafed to treat nasal congestion.  It is highly recommended that you consult with a pharmacist or your primary care physician to ensure this medication is safe for you to take.     If you have a cough, you may use cough suppressants such as Delsym and Robitussin.  If you have glaucoma or high blood pressure, you can also use Coricidin HBP.   For cough I have prescribed for you A prescription cough medication called Tessalon Perles 100 mg. You may take 1-2 capsules every 8 hours as needed for cough  If you have a sore or scratchy throat, use a saltwater gargle-  to  teaspoon of salt dissolved in a 4-ounce to 8-ounce glass of warm water.  Gargle the solution for approximately 15-30 seconds and then spit.  It is important not to swallow the solution.  You can also use throat lozenges/cough drops and Chloraseptic spray to help with throat pain or discomfort.  Warm or cold liquids can also be helpful in relieving throat pain.  For headache, pain or general discomfort, you can use Ibuprofen or Tylenol as directed.   Some authorities believe   that zinc sprays or the use of Echinacea may shorten the course of your symptoms.   HOME CARE Only take medications as instructed by your medical team. Be sure to drink plenty of fluids. Water is fine as well as fruit juices, sodas and electrolyte beverages. You may want to stay away from caffeine or alcohol. If you are nauseated, try taking small sips of liquids. How do you know if you are getting enough fluid? Your urine should be a pale yellow or almost colorless. Get rest. Taking a steamy shower or using a humidifier may help nasal congestion and ease sore throat pain. You can place a towel over  your head and breathe in the steam from hot water coming from a faucet. Using a saline nasal spray works much the same way. Cough drops, hard candies and sore throat lozenges may ease your cough. Avoid close contacts especially the very young and the elderly Cover your mouth if you cough or sneeze Always remember to wash your hands.   GET HELP RIGHT AWAY IF: You develop worsening fever. If your symptoms do not improve within 10 days You develop yellow or green discharge from your nose over 3 days. You have coughing fits You develop a severe head ache or visual changes. You develop shortness of breath, difficulty breathing or start having chest pain Your symptoms persist after you have completed your treatment plan  MAKE SURE YOU  Understand these instructions. Will watch your condition. Will get help right away if you are not doing well or get worse.  Thank you for choosing an e-visit.  Your e-visit answers were reviewed by a board certified advanced clinical practitioner to complete your personal care plan. Depending upon the condition, your plan could have included both over the counter or prescription medications.  Please review your pharmacy choice. Make sure the pharmacy is open so you can pick up prescription now. If there is a problem, you may contact your provider through MyChart messaging and have the prescription routed to another pharmacy.  Your safety is important to us. If you have drug allergies check your prescription carefully.   For the next 24 hours you can use MyChart to ask questions about today's visit, request a non-urgent call back, or ask for a work or school excuse. You will get an email in the next two days asking about your experience. I hope that your e-visit has been valuable and will speed your recovery.  Meds ordered this encounter  Medications   fluticasone (FLONASE) 50 MCG/ACT nasal spray    Sig: Place 2 sprays into both nostrils daily.    Dispense:   16 g    Refill:  6   benzonatate (TESSALON) 100 MG capsule    Sig: Take 1 capsule (100 mg total) by mouth 3 (three) times daily as needed.    Dispense:  30 capsule    Refill:  0     I spent approximately 5 minutes reviewing the patient's history, current symptoms and coordinating their care today.   

## 2022-12-19 ENCOUNTER — Ambulatory Visit: Payer: 59 | Admitting: Family Medicine

## 2022-12-25 ENCOUNTER — Other Ambulatory Visit: Payer: Self-pay

## 2022-12-25 ENCOUNTER — Other Ambulatory Visit (HOSPITAL_COMMUNITY): Payer: Self-pay

## 2022-12-27 ENCOUNTER — Other Ambulatory Visit (HOSPITAL_COMMUNITY): Payer: Self-pay

## 2022-12-30 NOTE — Progress Notes (Signed)
Virtual Visit via Video Note  I connected with Mary Roman on 01/08/23 at  4:30 PM EDT by a video enabled telemedicine application and verified that I am speaking with the correct person using two identifiers.  Location: Patient: home Provider: office Persons participated in the visit- patient, provider    I discussed the limitations of evaluation and management by telemedicine and the availability of in person appointments. The patient expressed understanding and agreed to proceed.    I discussed the assessment and treatment plan with the patient. The patient was provided an opportunity to ask questions and all were answered. The patient agreed with the plan and demonstrated an understanding of the instructions.   The patient was advised to call back or seek an in-person evaluation if the symptoms worsen or if the condition fails to improve as anticipated.  I provided 20 minutes of non-face-to-face time during this encounter.   Neysa Hotter, MD    [facility] MD/PA/NP OP Progress Note  01/08/2023 5:13 PM [name]  MRN:  [id number]  Chief Complaint:  Chief Complaint  Patient presents with   Follow-up   HPI:  This is a follow-up appointment for depression, insomnia.  She states that she has headache and nausea.  She has not been able to sleep for the past several days as she ran out [employer].  She states that she originally contacted the pharmacy, who she expected to notify our office regarding this refill, although this writer could not find it in the chart.  She agrees to contact my chart message as needed to ensure refill.  She states that she has been feeling very stressed.  She is doing 10-hour shift due to turnover.  She also reports conflict with her sister, who lives across her mother's place.  She and her other siblings were not notified when her mother went to the hospital, and nobody was there.  She agrees that she feels the pain for her mother.  She was very  emotional when she talks with his sister.  Validated her feeling.  She feels fatigue.  Her focus has been good except the time she was sick.  She has poor appetite lately, which she attributes to her insomnia.  She denies SI.  She feels comfortable to stay on the current medication regimen at this time.   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  one son, one 37 yo daughter  Visit Diagnosis:    ICD-10-CM   1. MDD (major depressive disorder), recurrent episode, mild (HCC)  F33.0     2. Primary insomnia  F51.01 zaleplon (SONATA) 10 MG capsule    3. GAD (generalized anxiety disorder)  F41.1     4. Insomnia, unspecified type  G47.00       Past Psychiatric History: Please see initial evaluation for full details. I have reviewed the history. No updates at this time.     Past Medical History:  Past Medical History:  Diagnosis Date   Absolute anemia 06/23/2018   Added automatically from request for surgery [id number]   Anemia    Anxiety    Backache 12/24/2007   Chronic back pain    Depression    Diabetes mellitus, type II (HCC)  Headache(784.0)    Hypertension    Metabolic syndrome X 04/12/2014   Obesity    Palpitation    Sickle cell trait (HCC)    Thyromegaly    Unspecified vitamin D deficiency 02/19/2013    Past Surgical History:  Procedure Laterality Date   COLONOSCOPY WITH PROPOFOL N/A 01/03/2021   Procedure: COLONOSCOPY WITH PROPOFOL;  Surgeon: Dolores Frame, MD;  Location: [facility];  Service: Gastroenterology;  Laterality: N/A;  AM   DILATATION AND CURETTAGE/HYSTEROSCOPY WITH MINERVA N/A 03/25/2019   Procedure: DILATATION AND CURETTAGE/HYSTEROSCOPY WITH  ATTEMPTED MINERVA ENDOMETRIAL ABLATION;  Surgeon: Lazaro Arms, MD;  Location: [facility] ORS;  Service: Gynecology;  Laterality: N/A;   None      Family Psychiatric History: Please see initial evaluation for full details. I have reviewed the history. No updates at this time.     Family History:  Family History  Problem Relation Age of Onset   Diabetes Mother    Hypertension Mother    Stroke Mother    Colon cancer Mother    Alcohol abuse Mother    Cancer - Colon Mother 93   Pneumonia Father    Depression Sister    Hypertension Sister    Anxiety disorder Sister    Hypertension Sister    Leukemia Sister    Heart attack Maternal Uncle    Hypertension Maternal Grandfather    Diabetes Maternal Grandmother    Hypertension Maternal Grandmother    Depression Child     Social History:  Social History   Socioeconomic History   Marital status: Single    Spouse name: Not on file   Number of children: 2   Years of education: Not on file   Highest education level: Not on file  Occupational History   Occupation: employed in medical office   Tobacco Use   Smoking status: Never   Smokeless tobacco: Never  Vaping Use   Vaping Use: Never used  Substance and Sexual Activity   Alcohol use: No   Drug use: No   Sexual activity: Yes    Partners: Male    Birth control/protection: Other-see comments    Comment: bf had vasectomy  Other Topics Concern   Not on file  Social History Narrative   Not on file   Social Determinants of Health   Financial Resource Strain: Medium Risk (03/08/2022)   Overall Financial Resource Strain (CARDIA)    Difficulty of Paying Living Expenses: Somewhat hard  Food Insecurity: Patient Declined (03/08/2022)   Hunger Vital Sign    Worried About Running Out of Food in the Last Year: Patient declined    Ran Out of Food in the Last Year: Patient declined  Transportation Needs: No Transportation Needs (03/08/2022)   PRAPARE - Administrator, Civil Service (Medical): No    Lack of Transportation  (Non-Medical): No  Physical Activity: Inactive (03/08/2022)   Exercise Vital Sign    Days of Exercise per Week: 0 days    Minutes of Exercise per Session: 0 min  Stress: Stress Concern Present (03/08/2022)   [employer] of Occupational Health - Occupational Stress Questionnaire    Feeling of Stress : Very much  Social Connections: Moderately Isolated (03/08/2022)   Social Connection and Isolation Panel [NHANES]    Frequency of Communication with Friends and Family: Twice a week    Frequency of Social Gatherings with Friends and Family: Once a week    Attends Religious Services: Never    Active  Member of Clubs or Organizations: No    Attends Engineer, structural: Never    Marital Status: Living with partner    Allergies: No Known Allergies  Metabolic Disorder Labs: Lab Results  Component Value Date   HGBA1C 9.4 (H) 10/17/2022   MPG 174.29 04/10/2022   MPG 240.3 08/25/2021   No results found for: "PROLACTIN" Lab Results  Component Value Date   CHOL 122 10/17/2022   TRIG 77 10/17/2022   HDL 32 (L) 10/17/2022   CHOLHDL 3.8 10/17/2022   VLDL 11 02/05/2022   LDLCALC 74 10/17/2022   LDLCALC 76 02/05/2022   Lab Results  Component Value Date   TSH 0.924 10/17/2022   TSH 1.564 08/25/2021    Therapeutic Level Labs: No results found for: "LITHIUM" No results found for: "VALPROATE" No results found for: "CBMZ"  Current Medications: Current Outpatient Medications  Medication Sig Dispense Refill   acetaminophen (TYLENOL) 500 MG tablet Take 1,000 mg by mouth every 6 (six) hours as needed (pain).     ALPRAZolam (XANAX) 1 MG tablet Take half to one tablet by mouth once weekly, as needed, for sleep 12 tablet 0   aspirin EC 81 MG tablet Take 1 tablet (81 mg total) by mouth daily. (Patient taking differently: Take 81 mg by mouth in the morning.) 90 tablet 3   benazepril (LOTENSIN) 5 MG tablet Take 1 tablet (5 mg total) by mouth daily. 90 tablet 3   benzonatate  (TESSALON) 100 MG capsule Take 1 capsule (100 mg total) by mouth 3 (three) times daily as needed. 30 capsule 0   blood glucose meter kit and supplies Dispense based on patient and insurance preference. Once daily testing dx e11.9 1 each 0   Blood Pressure KIT Use to check blood pressure daily. 1 kit 0   brexpiprazole (REXULTI) 1 MG TABS tablet Take 1 tablet (1 mg total) by mouth daily. 90 tablet 0   buPROPion (WELLBUTRIN XL) 150 MG 24 hr tablet Take 3 tablets (450 mg total) by mouth daily. 270 tablet 1   empagliflozin (JARDIANCE) 10 MG TABS tablet Take 1 tablet (10 mg total) by mouth daily before breakfast. 90 tablet 0   fluconazole (DIFLUCAN) 150 MG tablet Take 1 tablet (150 mg total) by mouth every 3 (three) days as needed. 2 tablet 0   fluticasone (FLONASE) 50 MCG/ACT nasal spray Place 2 sprays into both nostrils daily. 16 g 6   glipiZIDE (GLUCOTROL XL) 10 MG 24 hr tablet Take 2 tablets (20 mg total) by mouth in the morning with breakfast. 180 tablet 1   guaiFENesin (ROBITUSSIN) 100 MG/5ML liquid Take 5 mLs by mouth every 4 (four) hours as needed for cough or to loosen phlegm. 120 mL 0   megestrol (MEGACE) 40 MG tablet TAKE 1 TABLET BY MOUTH IN THE MORNING 30 tablet 11   metoprolol tartrate (LOPRESSOR) 25 MG tablet Take 1 tablet (25 mg total) by mouth 2 (two) times daily. Please schedule appointment for future refills. Thank you 180 tablet 1   ondansetron (ZOFRAN) 4 MG tablet TAKE 1 TABLET BY MOUTH ONCE DAILY AS NEEDED FOR NAUSEA 20 tablet 0   potassium chloride SA (KLOR-CON M) 20 MEQ tablet Take 1 tablet (20 mEq total) by mouth daily. 30 tablet 3   rosuvastatin (CRESTOR) 5 MG tablet Take 1 tablet (5 mg total) by mouth daily. 90 tablet 3   tirzepatide (MOUNJARO) 7.5 MG/0.5ML Pen Inject 7.5 mg into the skin once a week. 6 mL 0  triamterene-hydrochlorothiazide (MAXZIDE-25) 37.5-25 MG tablet Take 1 tablet by mouth daily. 90 tablet 3   venlafaxine XR (EFFEXOR-XR) 150 MG 24 hr capsule Take 1 capsule  (150 mg total) by mouth daily. 90 capsule 1   venlafaxine XR (EFFEXOR-XR) 75 MG 24 hr capsule Take 1 capsule (75 mg total) by mouth daily. Total of 225 mg daily. Take along with 150 mg cap 90 capsule 1   [START ON 02/06/2023] zaleplon (SONATA) 10 MG capsule Take 1 capsule (10 mg total) by mouth at bedtime as needed for sleep. 30 capsule 0   No current facility-administered medications for this visit.     Musculoskeletal: Strength & Muscle Tone:  N/A Gait & Station:  N/A Patient leans: N/A  Psychiatric Specialty Exam: Review of Systems  Psychiatric/Behavioral:  Positive for dysphoric mood and sleep disturbance. Negative for agitation, behavioral problems, confusion, decreased concentration, hallucinations, self-injury and suicidal ideas. The patient is nervous/anxious. The patient is not hyperactive.   All other systems reviewed and are negative.   There were no vitals taken for this visit.There is no height or weight on file to calculate BMI.  General Appearance: Fairly Groomed  Eye Contact:  Good  Speech:  Clear and Coherent  Volume:  Normal  Mood:   tired  Affect:  Appropriate, Congruent, Tearful, and down  Thought Process:  Coherent  Orientation:  Full (Time, Place, and Person)  Thought Content: Logical   Suicidal Thoughts:  No  Homicidal Thoughts:  No  Memory:  Immediate;   Good  Judgement:  Good  Insight:  Good  Psychomotor Activity:  Normal  Concentration:  Concentration: Good and Attention Span: Good  Recall:  Good  Fund of Knowledge: Good  Language: Good  Akathisia:  No  Handed:  Right  AIMS (if indicated): not done  Assets:  Communication Skills Desire for Improvement  ADL's:  Intact  Cognition: WNL  Sleep:  Poor   Screenings: GAD-7    Flowsheet Row Office Visit from 10/17/2022 in [facility] Office Visit from 07/10/2022 in [facility] Office Visit from 03/08/2022 in [facility] Office Visit from 02/07/2022 in [facility] Counselor from 01/23/2022 in [facility] at [place]  Total GAD-7 Score 4 7 16 12 15       PHQ2-9    Flowsheet Row Office Visit from 10/17/2022 in [facility] Video Visit from 07/24/2022 in [facility] Office Visit from 07/10/2022 in [facility] Office Visit from 04/12/2022 in [facility] Office Visit from 03/08/2022 in [facility]  PHQ-2 Total Score 2 0 2 3 6   PHQ-9 Total Score 8 0 8 9 14       Flowsheet Row Video Visit from 09/14/2021 in [facility] Video Visit from 08/10/2021 in [facility] Video Visit from 05/04/2021 in [facility]  C-SSRS RISK CATEGORY Error: Q3, 4, or 5 should not be populated when Q2 is No No Risk Low Risk        Assessment and Plan:  EARNA CAPUTI is a 52 y.o. year old female with a history of depression, mild OSA, diabetes, who presents for follow up appointment for below.   2. MDD (major depressive disorder), recurrent episode, mild (HCC) 3. GAD (generalized anxiety disorder) Acute  stressors include: her mother with cancer, proceeding with biopsy, her fiancee with pending knee replacement due to diabetes, work related stress due to shortage of staff ,conflict with one of her sister  Other stressors include: financial strain, difficulty in communication with her daughter, conflict with her siblings   History:   Worsening in the setting of stressors as above.  Will continue current medication regimen at this time with the hope that her mood improves as her sleep improves with the intervention described below..  Will continue venlafaxine, bupropion, rexulti to target depression. Although it was  previously advised to reconnect with Ms. Bynum for therapy, she did not pursue this, and Ms. Bynum is not taking any new patient at this time.   4. Insomnia, unspecified type Significant worsening in the setting of having difficulty in filling Sonata. She will be back on this medication to target insomnia.  She expressed understanding that this medication will be tapered off in the future to avoid the risk of dependence.  She was also advised again to contact her sleep specialist given she was recommended CPAP machine by her cardiologist according to the chart review.     Plan (she will contact the office if she needs a refill) Continue venlafaxine 225 mg daily - it was confirmed with the patient that she has refills Continue bupropion 450 mg daily  Continue rexulti 1 mg daily  Continue Sonata 10 mg at night as needed for insomnia- [facility] Next appointment 8/8 at 4 pm for 30 mins, video (she declined in person visit due to work schedule) - on [employer] - on xanax 1 mg. 10 tabs per month, prescribed by her PCP   Past trials of medication: fluoxetine, duloxetine, Abilify (weight gain),  buspirone, Trazodone ("weird" feeling) temazepam, Ambien, Lunesta, Xanax      The patient demonstrates the following risk factors for suicide: Chronic risk factors for suicide include: psychiatric disorder of depression, anxiety and chronic pain. Acute risk factors for suicide include: N/A. Protective factors for this patient include: positive social support, responsibility to others (children, family), coping skills and hope for the future. Considering these factors, the overall suicide risk at this point appears to be low. Patient is appropriate for outpatient follow up.    Collaboration of Care: Collaboration of Care: Other reviewed notes in Epic  Patient/Guardian was advised Release of Information must be obtained prior to any record release in order to collaborate their care with an outside provider.  Patient/Guardian was advised if they have not already done so to contact the registration department to sign all necessary forms in order for [place] to release information regarding their care.   Consent: Patient/Guardian gives verbal consent for treatment and assignment of benefits for services provided during this visit. Patient/Guardian expressed understanding and agreed to proceed.    Neysa Hotter, MD 01/08/2023, 5:13 PM

## 2023-01-02 ENCOUNTER — Other Ambulatory Visit: Payer: Self-pay | Admitting: Psychiatry

## 2023-01-02 ENCOUNTER — Other Ambulatory Visit (HOSPITAL_COMMUNITY): Payer: Self-pay

## 2023-01-07 ENCOUNTER — Other Ambulatory Visit (HOSPITAL_COMMUNITY): Payer: Self-pay

## 2023-01-07 ENCOUNTER — Telehealth: Payer: Self-pay | Admitting: Psychiatry

## 2023-01-07 ENCOUNTER — Other Ambulatory Visit: Payer: Self-pay | Admitting: Psychiatry

## 2023-01-07 ENCOUNTER — Telehealth: Payer: 59 | Admitting: Physician Assistant

## 2023-01-07 DIAGNOSIS — B3731 Acute candidiasis of vulva and vagina: Secondary | ICD-10-CM

## 2023-01-07 DIAGNOSIS — F5101 Primary insomnia: Secondary | ICD-10-CM

## 2023-01-07 MED ORDER — ZALEPLON 10 MG PO CAPS
10.0000 mg | ORAL_CAPSULE | Freq: Every evening | ORAL | 0 refills | Status: DC | PRN
Start: 2023-01-07 — End: 2023-01-08

## 2023-01-07 MED ORDER — ZALEPLON 10 MG PO CAPS
10.0000 mg | ORAL_CAPSULE | Freq: Every evening | ORAL | 0 refills | Status: DC | PRN
  Filled 2023-01-07: qty 30, 30d supply, fill #0

## 2023-01-07 MED ORDER — FLUCONAZOLE 150 MG PO TABS
150.0000 mg | ORAL_TABLET | ORAL | 0 refills | Status: DC | PRN
Start: 2023-01-07 — End: 2023-07-01

## 2023-01-07 NOTE — Telephone Encounter (Signed)
Pt.notified

## 2023-01-07 NOTE — Progress Notes (Signed)
E-Visit for Vaginal Symptoms  We are sorry that you are not feeling well. Here is how we plan to help! Based on what you shared with me it looks like you: May have a yeast vaginosis  Vaginosis is an inflammation of the vagina that can result in discharge, itching and pain. The cause is usually a change in the normal balance of vaginal bacteria or an infection. Vaginosis can also result from reduced estrogen levels after menopause.  The most common causes of vaginosis are:   Bacterial vaginosis which results from an overgrowth of one on several organisms that are normally present in your vagina.   Yeast infections which are caused by a naturally occurring fungus called candida.   Vaginal atrophy (atrophic vaginosis) which results from the thinning of the vagina from reduced estrogen levels after menopause.   Trichomoniasis which is caused by a parasite and is commonly transmitted by sexual intercourse.  Factors that increase your risk of developing vaginosis include: Medications, such as antibiotics and steroids Uncontrolled diabetes Use of hygiene products such as bubble bath, vaginal spray or vaginal deodorant Douching Wearing damp or tight-fitting clothing Using an intrauterine device (IUD) for birth control Hormonal changes, such as those associated with pregnancy, birth control pills or menopause Sexual activity Having a sexually transmitted infection  Your treatment plan is Diflucan (fluconazole) 150mg tablet once, may repeat in 3 days if needed.  I have electronically sent this prescription into the pharmacy that you have chosen.  Be sure to take all of the medication as directed. Stop taking any medication if you develop a rash, tongue swelling or shortness of breath. Mothers who are breast feeding should consider pumping and discarding their breast milk while on these antibiotics. However, there is no consensus that infant exposure at these doses would be harmful.  Remember  that medication creams can weaken latex condoms. .   HOME CARE:  Good hygiene may prevent some types of vaginosis from recurring and may relieve some symptoms:  Avoid baths, hot tubs and whirlpool spas. Rinse soap from your outer genital area after a shower, and dry the area well to prevent irritation. Don't use scented or harsh soaps, such as those with deodorant or antibacterial action. Avoid irritants. These include scented tampons and pads. Wipe from front to back after using the toilet. Doing so avoids spreading fecal bacteria to your vagina.  Other things that may help prevent vaginosis include:  Don't douche. Your vagina doesn't require cleansing other than normal bathing. Repetitive douching disrupts the normal organisms that reside in the vagina and can actually increase your risk of vaginal infection. Douching won't clear up a vaginal infection. Use a latex condom. Both female and female latex condoms may help you avoid infections spread by sexual contact. Wear cotton underwear. Also wear pantyhose with a cotton crotch. If you feel comfortable without it, skip wearing underwear to bed. Yeast thrives in moist environments Your symptoms should improve in the next day or two.  GET HELP RIGHT AWAY IF:  You have pain in your lower abdomen ( pelvic area or over your ovaries) You develop nausea or vomiting You develop a fever Your discharge changes or worsens You have persistent pain with intercourse You develop shortness of breath, a rapid pulse, or you faint.  These symptoms could be signs of problems or infections that need to be evaluated by a medical provider now.  MAKE SURE YOU   Understand these instructions. Will watch your condition. Will get help right   away if you are not doing well or get worse.  Thank you for choosing an e-visit.  Your e-visit answers were reviewed by a board certified advanced clinical practitioner to complete your personal care plan. Depending  upon the condition, your plan could have included both over the counter or prescription medications.  Please review your pharmacy choice. Make sure the pharmacy is open so you can pick up prescription now. If there is a problem, you may contact your provider through MyChart messaging and have the prescription routed to another pharmacy.  Your safety is important to us. If you have drug allergies check your prescription carefully.   For the next 24 hours you can use MyChart to ask questions about today's visit, request a non-urgent call back, or ask for a work or school excuse. You will get an email in the next two days asking about your experience. I hope that your e-visit has been valuable and will speed your recovery.  I have spent 5 minutes in review of e-visit questionnaire, review and updating patient chart, medical decision making and response to patient.   Monesha Monreal M Simcha Speir, PA-C  

## 2023-01-07 NOTE — Telephone Encounter (Signed)
Ordered

## 2023-01-07 NOTE — Telephone Encounter (Signed)
Patient calls stating she has run out of her medication, Sonata. The pharmacy sent it earlier in June and states they have not received it. Patient is requesting a refill and to be sent to [facility] in [place] so she does not have to wait on the mail order. Please advise

## 2023-01-08 ENCOUNTER — Other Ambulatory Visit (HOSPITAL_COMMUNITY): Payer: Self-pay

## 2023-01-08 ENCOUNTER — Telehealth (INDEPENDENT_AMBULATORY_CARE_PROVIDER_SITE_OTHER): Payer: 59 | Admitting: Psychiatry

## 2023-01-08 ENCOUNTER — Encounter: Payer: Self-pay | Admitting: Psychiatry

## 2023-01-08 ENCOUNTER — Telehealth: Payer: Self-pay | Admitting: Psychiatry

## 2023-01-08 DIAGNOSIS — F411 Generalized anxiety disorder: Secondary | ICD-10-CM | POA: Diagnosis not present

## 2023-01-08 DIAGNOSIS — G4700 Insomnia, unspecified: Secondary | ICD-10-CM

## 2023-01-08 DIAGNOSIS — F330 Major depressive disorder, recurrent, mild: Secondary | ICD-10-CM

## 2023-01-08 DIAGNOSIS — F5101 Primary insomnia: Secondary | ICD-10-CM

## 2023-01-08 MED ORDER — ZALEPLON 10 MG PO CAPS
10.0000 mg | ORAL_CAPSULE | Freq: Every evening | ORAL | 0 refills | Status: DC | PRN
Start: 2023-02-06 — End: 2023-03-08

## 2023-01-08 NOTE — Telephone Encounter (Signed)
notified patient that rx was sent to [facility]. pt states that [facility] would not fill because they state that [facility] had already fill rx. pt was told that i would call pharmacies and then call her back.

## 2023-01-08 NOTE — Telephone Encounter (Signed)
Patient called stated she got her medication sent to the wrong pharmacy. Patient requested a call back to follow up on how she can get her medication-please advise

## 2023-01-08 NOTE — Telephone Encounter (Signed)
Pt.notified

## 2023-01-08 NOTE — Telephone Encounter (Signed)
called they state that they have not fill for this month that patient should be able to get rx filled. no issues on their end.

## 2023-01-08 NOTE — Telephone Encounter (Signed)
they got medication to go threw. they will go ahead and get it ready for patient

## 2023-01-09 ENCOUNTER — Encounter: Payer: Self-pay | Admitting: Family Medicine

## 2023-01-16 ENCOUNTER — Other Ambulatory Visit: Payer: Self-pay | Admitting: Psychiatry

## 2023-01-16 ENCOUNTER — Other Ambulatory Visit: Payer: Self-pay

## 2023-01-17 ENCOUNTER — Other Ambulatory Visit (HOSPITAL_COMMUNITY): Payer: Self-pay

## 2023-01-29 ENCOUNTER — Other Ambulatory Visit (HOSPITAL_COMMUNITY): Payer: Self-pay

## 2023-01-30 ENCOUNTER — Other Ambulatory Visit: Payer: Self-pay | Admitting: Psychiatry

## 2023-01-30 ENCOUNTER — Other Ambulatory Visit (HOSPITAL_COMMUNITY): Payer: Self-pay

## 2023-02-05 ENCOUNTER — Other Ambulatory Visit (HOSPITAL_COMMUNITY): Payer: Self-pay

## 2023-02-05 ENCOUNTER — Other Ambulatory Visit: Payer: Self-pay | Admitting: Family Medicine

## 2023-02-06 ENCOUNTER — Other Ambulatory Visit: Payer: Self-pay

## 2023-02-06 ENCOUNTER — Other Ambulatory Visit (HOSPITAL_COMMUNITY): Payer: Self-pay

## 2023-02-06 ENCOUNTER — Ambulatory Visit: Payer: 59 | Admitting: Family Medicine

## 2023-02-06 ENCOUNTER — Encounter: Payer: Self-pay | Admitting: Family Medicine

## 2023-02-06 VITALS — BP 140/94 | HR 90 | Ht 65.0 in | Wt 202.0 lb

## 2023-02-06 DIAGNOSIS — I10 Essential (primary) hypertension: Secondary | ICD-10-CM

## 2023-02-06 DIAGNOSIS — E785 Hyperlipidemia, unspecified: Secondary | ICD-10-CM

## 2023-02-06 DIAGNOSIS — R002 Palpitations: Secondary | ICD-10-CM | POA: Diagnosis not present

## 2023-02-06 DIAGNOSIS — F331 Major depressive disorder, recurrent, moderate: Secondary | ICD-10-CM

## 2023-02-06 DIAGNOSIS — F5104 Psychophysiologic insomnia: Secondary | ICD-10-CM | POA: Diagnosis not present

## 2023-02-06 DIAGNOSIS — J040 Acute laryngitis: Secondary | ICD-10-CM

## 2023-02-06 DIAGNOSIS — E1159 Type 2 diabetes mellitus with other circulatory complications: Secondary | ICD-10-CM

## 2023-02-06 DIAGNOSIS — E669 Obesity, unspecified: Secondary | ICD-10-CM

## 2023-02-06 DIAGNOSIS — F411 Generalized anxiety disorder: Secondary | ICD-10-CM

## 2023-02-06 MED ORDER — OLMESARTAN MEDOXOMIL 20 MG PO TABS
20.0000 mg | ORAL_TABLET | Freq: Every day | ORAL | 1 refills | Status: DC
  Filled 2023-02-06: qty 90, 90d supply, fill #0
  Filled 2023-07-31: qty 90, 90d supply, fill #1

## 2023-02-06 MED ORDER — MOUNJARO 7.5 MG/0.5ML ~~LOC~~ SOAJ
7.5000 mg | SUBCUTANEOUS | 0 refills | Status: DC
  Filled 2023-02-06: qty 2, 28d supply, fill #0

## 2023-02-06 MED ORDER — MONTELUKAST SODIUM 10 MG PO TABS
10.0000 mg | ORAL_TABLET | Freq: Every day | ORAL | 3 refills | Status: AC
  Filled 2023-02-06: qty 30, 30d supply, fill #0

## 2023-02-06 NOTE — Assessment & Plan Note (Signed)
Started  5 days ago,had excess runy nose the day prior, strart daily flonase which she already has , add singulair, and voice rest

## 2023-02-06 NOTE — Patient Instructions (Addendum)
F/U in 5 to 6 weeks, re evaluate blood pressure,and blood sugar , call if you need me sooner  hBA1c, chem7  and EGFR  today  Fasting lipid, cmp and eGFR 3 days before next appointment  Blood pressure is high, stop benazepril, new is olmesartan 20 mg daily  Community  group diabetes classes are being offered free  I anticipate needing to adjust diabetes medication  Vital that you test and record blood sugar at lEAST once daily evey  morning before breakfast, I allso recommend last thing at night blood s8ugar is tested and recorded  PLEASE send blood sugar results to me once weekly  Foor harseness and runny nose, please commit to daily flonase and I have also prescribed montelukast tablet   Goal for fasting blood sugar ranges from 80 to 120 and 2 hours after any meal or at bedtime should be between 130 to 170.  Thanks for choosing [facility], we consider it a privelige to serve you.

## 2023-02-07 ENCOUNTER — Encounter: Payer: Self-pay | Admitting: Family Medicine

## 2023-02-07 ENCOUNTER — Other Ambulatory Visit: Payer: Self-pay

## 2023-02-07 ENCOUNTER — Other Ambulatory Visit (HOSPITAL_COMMUNITY): Payer: Self-pay

## 2023-02-07 LAB — BMP8+EGFR
BUN/Creatinine Ratio: 13 (ref 9–23)
BUN: 10 mg/dL (ref 6–24)
CO2: 22 mmol/L (ref 20–29)
Calcium: 9.5 mg/dL (ref 8.7–10.2)
Chloride: 97 mmol/L (ref 96–106)
Creatinine, Ser: 0.8 mg/dL (ref 0.57–1.00)
Glucose: 330 mg/dL — ABNORMAL HIGH (ref 70–99)
Potassium: 4.1 mmol/L (ref 3.5–5.2)
Sodium: 135 mmol/L (ref 134–144)
eGFR: 88 mL/min/{1.73_m2} (ref >59)

## 2023-02-07 LAB — HEMOGLOBIN A1C
Est. average glucose Bld gHb Est-mCnc: 309 mg/dL
Hgb A1c MFr Bld: 12.4 % — ABNORMAL HIGH (ref 4.8–5.6)

## 2023-02-07 MED ORDER — TIRZEPATIDE 10 MG/0.5ML ~~LOC~~ SOAJ
10.0000 mg | SUBCUTANEOUS | 0 refills | Status: DC
  Filled 2023-02-07 – 2023-03-04 (×2): qty 2, 28d supply, fill #0

## 2023-02-07 MED ORDER — TIRZEPATIDE 12.5 MG/0.5ML ~~LOC~~ SOAJ
12.5000 mg | SUBCUTANEOUS | 1 refills | Status: DC
  Filled 2023-02-07 – 2023-05-08 (×2): qty 2, 28d supply, fill #0

## 2023-02-07 MED ORDER — EMPAGLIFLOZIN 25 MG PO TABS
25.0000 mg | ORAL_TABLET | Freq: Every day | ORAL | 3 refills | Status: DC
  Filled 2023-02-07: qty 90, 90d supply, fill #0
  Filled 2023-07-31: qty 90, 90d supply, fill #1
  Filled 2024-01-08: qty 90, 90d supply, fill #2

## 2023-02-07 NOTE — Assessment & Plan Note (Signed)
Hyperlipidemia:Low fat diet discussed and encouraged.   Lipid Panel  Lab Results  Component Value Date   CHOL 122 10/17/2022   HDL 32 (L) 10/17/2022   LDLCALC 74 10/17/2022   TRIG 77 10/17/2022   CHOLHDL 3.8 10/17/2022     Will update lab at next visit

## 2023-02-07 NOTE — Assessment & Plan Note (Signed)
Sleep hygiene reviewed , managed by Psychiatry

## 2023-02-07 NOTE — Progress Notes (Signed)
Mary Roman     MRN: [id number]      DOB: June 06, 1971  Chief Complaint  Patient presents with   Follow-up    Follow up    HPI Mary Roman is here for follow up and re-evaluation of chronic medical conditions, medication management and review of any available recent lab and radiology data.  Preventive health is updated, specifically  Cancer screening and Immunization.   Not testing blood sugar regularly and when she does fBG is over 200 Denies polyuria, polydipsia, blurred vision , or hypoglycemic episodes. C/o increased stress on th  job, and not being able to take the time to do what she needs to do for her diabetes, wants to and needs to change this 5 day h/o acute onset hoarseness and loss of voice preseeded the day before by severe allergy symptoms, with clear runny nose, no fever , chills productive cough , no sore throat, no known sick contact  ROS Denies recent fever or chills. Denies sinus pressure, nasal congestion, ear pain or sore throat. Denies chest congestion, productive cough or wheezing. Denies chest pains, palpitations and leg swelling Denies abdominal pain, nausea, vomiting,diarrhea or constipation.   Denies dysuria, frequency, hesitancy or incontinence. Denies joint pain, swelling and limitation in mobility. Denies headaches, seizures, numbness, or tingling. Denies uncontrolled  depression, anxiety however  insomnia.continues to be a problem Denies skin break down or rash.   PE  BP (!) 140/94   Pulse 90   Ht 5\' 5"  (1.651 m)   Wt 202 lb 0.6 oz (91.6 kg)   SpO2 95%   BMI 33.62 kg/m   Patient alert and oriented and in no cardiopulmonary distress.  HEENT: No facial asymmetry, EOMI,     Neck supple .no adenopathy, no erythema of t oropharynx , no tonsillar enlargement  Chest: Clear to auscultation bilaterally.  CVS: S1, S2 no murmurs, no S3.Regular rate.  ABD: Soft non tender.   Ext: No edema  MS: Adequate ROM spine, shoulders, hips and  knees.  Skin: Intact, no ulcerations or rash noted.  Psych: Good eye contact, normal affect. Memory intact not anxious or depressed appearing.  CNS: CN 2-12 intact, power,  normal throughout.no focal deficits noted.   Assessment & Plan  Laryngitis, acute Started  5 days ago,had excess runy nose the day prior, strart daily flonase which she already has , add singulair, and voice rest  Essential hypertension Uncontrolled, stop benazepril 5 mg , new is olmesartan 20 mg daily DASH diet and commitment to daily physical activity for a minimum of 30 minutes discussed and encouraged, as a part of hypertension management. The importance of attaining a healthy weight is also discussed.     02/06/2023    3:12 PM 02/06/2023    3:11 PM 02/06/2023    2:48 PM 02/06/2023    2:46 PM 10/17/2022    2:59 PM 07/10/2022    3:03 PM 07/10/2022    3:02 PM  BP/Weight  Systolic BP 140 142 152 169 128 140 140  Diastolic BP 94 92 89 84 74 88 90  Wt. (Lbs)    202.04 192.12    BMI    33.62 kg/m2 31.97 kg/m2         Dyslipidemia (high LDL; low HDL) Hyperlipidemia:Low fat diet discussed and encouraged.   Lipid Panel  Lab Results  Component Value Date   CHOL 122 10/17/2022   HDL 32 (L) 10/17/2022   LDLCALC 74 10/17/2022   TRIG 77 10/17/2022  CHOLHDL 3.8 10/17/2022     Will update lab at next visit  Insomnia Sleep hygiene reviewed , managed by Psychiatry   Type 2 diabetes mellitus with vascular disease (HCC) Uncontrolled Increase jardiance to 25 mg daily Incrase mounjaro dose every month. Weekly blood sugar reports to be sent via My  Chart Mary Roman is reminded of the importance of commitment to daily physical activity for 30 minutes or more, as able and the need to limit carbohydrate intake to 30 to 60 grams per meal to help with blood sugar control.   The need to take medication as prescribed, test blood sugar as directed, and to call between visits if there is a concern that blood  sugar is uncontrolled is also discussed.   Mary Roman is reminded of the importance of daily foot exam, annual eye examination, and good blood sugar, blood pressure and cholesterol control.     Latest Ref Rng & Units 02/06/2023    3:27 PM 10/17/2022    4:17 PM 07/10/2022    3:28 PM 04/12/2022    3:16 PM 04/10/2022    8:33 AM  Diabetic Labs  HbA1c 4.8 - 5.6 % 12.4  9.4  8.3   7.7   Micro/Creat Ratio 0 - 29 mg/g creat    5    Chol 100 - 199 mg/dL  161      HDL >09 mg/dL  32      Calc LDL 0 - 99 mg/dL  74      Triglycerides 0 - 149 mg/dL  77      Creatinine 6.04 - 1.00 mg/dL 5.40  9.81  1.91   4.78       02/06/2023    3:12 PM 02/06/2023    3:11 PM 02/06/2023    2:48 PM 02/06/2023    2:46 PM 10/17/2022    2:59 PM 07/10/2022    3:03 PM 07/10/2022    3:02 PM  BP/Weight  Systolic BP 140 142 152 169 128 140 140  Diastolic BP 94 92 89 84 74 88 90  Wt. (Lbs)    202.04 192.12    BMI    33.62 kg/m2 31.97 kg/m2        Latest Ref Rng & Units 10/15/2022   11:23 AM 07/10/2022    2:20 PM  Foot/eye exam completion dates  Eye Exam No Retinopathy Retinopathy       Foot Form Completion   Done     This result is from an external source.        Obesity (BMI 30.0-34.9)  Patient re-educated about  the importance of commitment to a  minimum of 150 minutes of exercise per week as able.  The importance of healthy food choices with portion control discussed, as well as eating regularly and within a 12 hour window most days. The need to choose "clean , green" food 50 to 75% of the time is discussed, as well as to make water the primary drink and set a goal of 64 ounces water daily.       02/06/2023    2:46 PM 10/17/2022    2:59 PM 07/10/2022    2:22 PM  Weight /BMI  Weight 202 lb 0.6 oz 192 lb 1.9 oz 194 lb  Height 5\' 5"  (1.651 m) 5\' 5"  (1.651 m) 5\' 5"  (1.651 m)  BMI 33.62 kg/m2 31.97 kg/m2 32.28 kg/m2      Palpitations Controlled, no change in medication   GAD (generalized  anxiety disorder)  Improved, managed by Psych  Major depressive disorder, recurrent episode, moderate (HCC) Improved , managed by Psych

## 2023-02-07 NOTE — Assessment & Plan Note (Signed)
Uncontrolled Increase jardiance to 25 mg daily Incrase mounjaro dose every month. Weekly blood sugar reports to be sent via My  Chart [name] is reminded of the importance of commitment to daily physical activity for 30 minutes or more, as able and the need to limit carbohydrate intake to 30 to 60 grams per meal to help with blood sugar control.   The need to take medication as prescribed, test blood sugar as directed, and to call between visits if there is a concern that blood sugar is uncontrolled is also discussed.   [name] is reminded of the importance of daily foot exam, annual eye examination, and good blood sugar, blood pressure and cholesterol control.     Latest Ref Rng & Units 02/06/2023    3:27 PM 10/17/2022    4:17 PM 07/10/2022    3:28 PM 04/12/2022    3:16 PM 04/10/2022    8:33 AM  Diabetic Labs  HbA1c 4.8 - 5.6 % 12.4  9.4  8.3   7.7   Micro/Creat Ratio 0 - 29 mg/g creat    5    Chol 100 - 199 mg/dL  161      HDL >09 mg/dL  32      Calc LDL 0 - 99 mg/dL  74      Triglycerides 0 - 149 mg/dL  77      Creatinine 6.04 - 1.00 mg/dL 5.40  9.81  1.91   4.78       02/06/2023    3:12 PM 02/06/2023    3:11 PM 02/06/2023    2:48 PM 02/06/2023    2:46 PM 10/17/2022    2:59 PM 07/10/2022    3:03 PM 07/10/2022    3:02 PM  BP/Weight  Systolic BP 140 142 152 169 128 140 140  Diastolic BP 94 92 89 84 74 88 90  Wt. (Lbs)    202.04 192.12    BMI    33.62 kg/m2 31.97 kg/m2        Latest Ref Rng & Units 10/15/2022   11:23 AM 07/10/2022    2:20 PM  Foot/eye exam completion dates  Eye Exam No Retinopathy Retinopathy       Foot Form Completion   Done     This result is from an external source.

## 2023-02-07 NOTE — Assessment & Plan Note (Signed)
Uncontrolled, stop benazepril 5 mg , new is olmesartan 20 mg daily DASH diet and commitment to daily physical activity for a minimum of 30 minutes discussed and encouraged, as a part of hypertension management. The importance of attaining a healthy weight is also discussed.     02/06/2023    3:12 PM 02/06/2023    3:11 PM 02/06/2023    2:48 PM 02/06/2023    2:46 PM 10/17/2022    2:59 PM 07/10/2022    3:03 PM 07/10/2022    3:02 PM  BP/Weight  Systolic BP 140 142 152 169 128 140 140  Diastolic BP 94 92 89 84 74 88 90  Wt. (Lbs)    202.04 192.12    BMI    33.62 kg/m2 31.97 kg/m2

## 2023-02-07 NOTE — Assessment & Plan Note (Signed)
Improved, managed by Psych 

## 2023-02-07 NOTE — Assessment & Plan Note (Signed)
Controlled, no change in medication  

## 2023-02-07 NOTE — Assessment & Plan Note (Signed)
  Patient re-educated about  the importance of commitment to a  minimum of 150 minutes of exercise per week as able.  The importance of healthy food choices with portion control discussed, as well as eating regularly and within a 12 hour window most days. The need to choose "clean , green" food 50 to 75% of the time is discussed, as well as to make water the primary drink and set a goal of 64 ounces water daily.       02/06/2023    2:46 PM 10/17/2022    2:59 PM 07/10/2022    2:22 PM  Weight /BMI  Weight 202 lb 0.6 oz 192 lb 1.9 oz 194 lb  Height 5\' 5"  (1.651 m) 5\' 5"  (1.651 m) 5\' 5"  (1.651 m)  BMI 33.62 kg/m2 31.97 kg/m2 32.28 kg/m2

## 2023-02-11 ENCOUNTER — Other Ambulatory Visit (HOSPITAL_COMMUNITY): Payer: Self-pay

## 2023-02-13 ENCOUNTER — Other Ambulatory Visit (HOSPITAL_COMMUNITY): Payer: Self-pay

## 2023-02-14 ENCOUNTER — Other Ambulatory Visit: Payer: Self-pay | Admitting: Psychiatry

## 2023-02-14 ENCOUNTER — Other Ambulatory Visit (HOSPITAL_COMMUNITY): Payer: Self-pay

## 2023-02-15 ENCOUNTER — Other Ambulatory Visit (HOSPITAL_COMMUNITY): Payer: Self-pay

## 2023-03-02 NOTE — Progress Notes (Deleted)
BH MD/PA/NP OP Progress Note  03/02/2023 11:10 AM Mary Roman  MRN:  [id number]  Chief Complaint: No chief complaint on file.  HPI:  According to the chart review, the following events have occurred since the last visit: The patient was seen by Dr. Lodema Hong, PCP. Mounjaro, jardiance was increased for type II DM.   Visit Diagnosis: No diagnosis found.  Past Psychiatric History: Please see initial evaluation for full details. I have reviewed the history. No updates at this time.     Past Medical History:  Past Medical History:  Diagnosis Date   Absolute anemia 06/23/2018   Added automatically from request for surgery [id number]   Anemia    Anxiety    Backache 12/24/2007   Chronic back pain    Depression    Diabetes mellitus, type II (HCC)    Headache(784.0)    Hypertension    Metabolic syndrome X 04/12/2014   Obesity    Palpitation    Sickle cell trait (HCC)    Thyromegaly    Unspecified vitamin D deficiency 02/19/2013    Past Surgical History:  Procedure Laterality Date   COLONOSCOPY WITH PROPOFOL N/A 01/03/2021   Procedure: COLONOSCOPY WITH PROPOFOL;  Surgeon: Dolores Frame, MD;  Location: [facility];  Service: Gastroenterology;  Laterality: N/A;  AM   DILATATION AND CURETTAGE/HYSTEROSCOPY WITH MINERVA N/A 03/25/2019   Procedure: DILATATION AND CURETTAGE/HYSTEROSCOPY WITH  ATTEMPTED MINERVA ENDOMETRIAL ABLATION;  Surgeon: Lazaro Arms, MD;  Location: [facility];  Service: Gynecology;  Laterality: N/A;   None      Family Psychiatric History: Please see initial evaluation for full details. I have reviewed the history. No updates at this time.     Family History:  Family History  Problem Relation Age of Onset   Diabetes Mother    Hypertension Mother    Stroke Mother    Colon cancer Mother    Alcohol abuse Mother    Cancer - Colon Mother 77   Pneumonia Father    Depression Sister    Hypertension Sister    Anxiety disorder Sister    Hypertension  Sister    Leukemia Sister    Heart attack Maternal Uncle    Hypertension Maternal Grandfather    Diabetes Maternal Grandmother    Hypertension Maternal Grandmother    Depression Child     Social History:  Social History   Socioeconomic History   Marital status: Single    Spouse name: Not on file   Number of children: 2   Years of education: Not on file   Highest education level: Not on file  Occupational History   Occupation: employed in medical office   Tobacco Use   Smoking status: Never   Smokeless tobacco: Never  Vaping Use   Vaping status: Never Used  Substance and Sexual Activity   Alcohol use: No   Drug use: No   Sexual activity: Yes    Partners: Male    Birth control/protection: Other-see comments    Comment: bf had vasectomy  Other Topics Concern   Not on file  Social History Narrative   Not on file   Social Determinants of Health   Financial Resource Strain: Medium Risk (03/08/2022)   Overall Financial Resource Strain (CARDIA)    Difficulty of Paying Living Expenses: Somewhat hard  Food Insecurity: Patient Declined (03/08/2022)   Hunger Vital Sign    Worried About Running Out of Food in the Last Year: Patient declined    Ran Out  of Food in the Last Year: Patient declined  Transportation Needs: No Transportation Needs (03/08/2022)   PRAPARE - Administrator, Civil Service (Medical): No    Lack of Transportation (Non-Medical): No  Physical Activity: Inactive (03/08/2022)   Exercise Vital Sign    Days of Exercise per Week: 0 days    Minutes of Exercise per Session: 0 min  Stress: Stress Concern Present (03/08/2022)   [employer] of Occupational Health - Occupational Stress Questionnaire    Feeling of Stress : Very much  Social Connections: Moderately Isolated (03/08/2022)   Social Connection and Isolation Panel [NHANES]    Frequency of Communication with Friends and Family: Twice a week    Frequency of Social Gatherings with Friends and  Family: Once a week    Attends Religious Services: Never    Database administrator or Organizations: No    Attends Engineer, structural: Never    Marital Status: Living with partner    Allergies: No Known Allergies  Metabolic Disorder Labs: Lab Results  Component Value Date   HGBA1C 12.4 (H) 02/06/2023   MPG 174.29 04/10/2022   MPG 240.3 08/25/2021   No results found for: "PROLACTIN" Lab Results  Component Value Date   CHOL 122 10/17/2022   TRIG 77 10/17/2022   HDL 32 (L) 10/17/2022   CHOLHDL 3.8 10/17/2022   VLDL 11 02/05/2022   LDLCALC 74 10/17/2022   LDLCALC 76 02/05/2022   Lab Results  Component Value Date   TSH 0.924 10/17/2022   TSH 1.564 08/25/2021    Therapeutic Level Labs: No results found for: "LITHIUM" No results found for: "VALPROATE" No results found for: "CBMZ"  Current Medications: Current Outpatient Medications  Medication Sig Dispense Refill   acetaminophen (TYLENOL) 500 MG tablet Take 1,000 mg by mouth every 6 (six) hours as needed (pain).     ALPRAZolam (XANAX) 1 MG tablet Take half to one tablet by mouth once weekly, as needed, for sleep 12 tablet 0   aspirin EC 81 MG tablet Take 1 tablet (81 mg total) by mouth daily. (Patient taking differently: Take 81 mg by mouth in the morning.) 90 tablet 3   benzonatate (TESSALON) 100 MG capsule Take 1 capsule (100 mg total) by mouth 3 (three) times daily as needed. 30 capsule 0   blood glucose meter kit and supplies Dispense based on patient and insurance preference. Once daily testing dx e11.9 1 each 0   Blood Pressure KIT Use to check blood pressure daily. 1 kit 0   buPROPion (WELLBUTRIN XL) 150 MG 24 hr tablet Take 3 tablets (450 mg total) by mouth daily. 270 tablet 1   empagliflozin (JARDIANCE) 25 MG TABS tablet Take 1 tablet (25 mg total) by mouth daily before breakfast. 90 tablet 3   fluconazole (DIFLUCAN) 150 MG tablet Take 1 tablet (150 mg total) by mouth every 3 (three) days as needed. 2  tablet 0   fluticasone (FLONASE) 50 MCG/ACT nasal spray Place 2 sprays into both nostrils daily. 16 g 6   glipiZIDE (GLUCOTROL XL) 10 MG 24 hr tablet Take 2 tablets (20 mg total) by mouth in the morning with breakfast. 180 tablet 1   guaiFENesin (ROBITUSSIN) 100 MG/5ML liquid Take 5 mLs by mouth every 4 (four) hours as needed for cough or to loosen phlegm. 120 mL 0   megestrol (MEGACE) 40 MG tablet TAKE 1 TABLET BY MOUTH IN THE MORNING 30 tablet 11   metoprolol tartrate (LOPRESSOR) 25 MG  tablet Take 1 tablet (25 mg total) by mouth 2 (two) times daily. Please schedule appointment for future refills. Thank you 180 tablet 1   montelukast (SINGULAIR) 10 MG tablet Take 1 tablet (10 mg total) by mouth at bedtime. 30 tablet 3   olmesartan (BENICAR) 20 MG tablet Take 1 tablet (20 mg total) by mouth daily. 90 tablet 1   ondansetron (ZOFRAN) 4 MG tablet TAKE 1 TABLET BY MOUTH ONCE DAILY AS NEEDED FOR NAUSEA 20 tablet 0   potassium chloride SA (KLOR-CON M) 20 MEQ tablet Take 1 tablet (20 mEq total) by mouth daily. 30 tablet 3   rosuvastatin (CRESTOR) 5 MG tablet Take 1 tablet (5 mg total) by mouth daily. 90 tablet 3   [START ON 03/06/2023] tirzepatide (MOUNJARO) 10 MG/0.5ML Pen Inject 10 mg into the skin once a week. 2 mL 0   [START ON 03/27/2023] tirzepatide (MOUNJARO) 12.5 MG/0.5ML Pen Inject 12.5 mg into the skin once a week. 2 mL 1   triamterene-hydrochlorothiazide (MAXZIDE-25) 37.5-25 MG tablet Take 1 tablet by mouth daily. 90 tablet 3   venlafaxine XR (EFFEXOR-XR) 150 MG 24 hr capsule Take 1 capsule (150 mg total) by mouth daily. 90 capsule 1   venlafaxine XR (EFFEXOR-XR) 75 MG 24 hr capsule Take 1 capsule (75 mg total) by mouth daily. Total of 225 mg daily. Take along with 150 mg cap 90 capsule 1   zaleplon (SONATA) 10 MG capsule Take 1 capsule (10 mg total) by mouth at bedtime as needed for sleep. 30 capsule 0   No current facility-administered medications for this visit.      Musculoskeletal: Strength & Muscle Tone:  N/A Gait & Station:  N/A Patient leans: N/A  Psychiatric Specialty Exam: Review of Systems  There were no vitals taken for this visit.There is no height or weight on file to calculate BMI.  General Appearance: {Appearance:22683}  Eye Contact:  {BHH EYE CONTACT:22684}  Speech:  Clear and Coherent  Volume:  Normal  Mood:  {BHH MOOD:22306}  Affect:  {Affect (PAA):22687}  Thought Process:  Coherent  Orientation:  Full (Time, Place, and Person)  Thought Content: Logical   Suicidal Thoughts:  {ST/HT (PAA):22692}  Homicidal Thoughts:  {ST/HT (PAA):22692}  Memory:  Immediate;   Good  Judgement:  {Judgement (PAA):22694}  Insight:  {Insight (PAA):22695}  Psychomotor Activity:  Normal  Concentration:  Concentration: Good and Attention Span: Good  Recall:  Good  Fund of Knowledge: Good  Language: Good  Akathisia:  No  Handed:  Right  AIMS (if indicated): not done  Assets:  Communication Skills Desire for Improvement  ADL's:  Intact  Cognition: WNL  Sleep:  {BHH GOOD/FAIR/POOR:22877}   Screenings: GAD-7    Flowsheet Row Office Visit from 02/06/2023 in [facility] Office Visit from 10/17/2022 in [facility] Office Visit from 07/10/2022 in [facility] Office Visit from 03/08/2022 in [facility] Office Visit from 02/07/2022 in [facility]  Total GAD-7 Score 6 4 7 16 12       PHQ2-9    Flowsheet Row Office Visit from 02/06/2023 in [facility] Office Visit from 10/17/2022 in [facility] Video Visit from 07/24/2022 in [facility] Office Visit from 07/10/2022 in [facility] Office Visit from 04/12/2022 in [facility]  PHQ-2 Total Score 2 2 0 2 3  PHQ-9 Total Score 9 8 0 8 9       Flowsheet Row Video Visit from 09/14/2021 in [facility] Video Visit from 08/10/2021 in [facility] Video Visit from 05/04/2021 in [facility]  C-SSRS RISK CATEGORY Error: Q3, 4, or 5 should not be populated when Q2 is No No Risk Low Risk        Assessment and Plan:  Mary Roman is a 52 y.o. year old female with a history of depression, mild OSA, diabetes, who presents for follow up appointment for below.    2. MDD (major depressive disorder), recurrent episode, mild (HCC) 3. GAD (generalized anxiety disorder) Acute stressors include: her mother with cancer, proceeding with biopsy, her fiancee with pending knee replacement due to diabetes, work related stress due to shortage of staff ,conflict with one of her sister  Other stressors include: financial strain, difficulty in communication with her daughter, conflict with her siblings   History:   Worsening in the setting of stressors as above.  Will continue current medication regimen at this time with the hope that her mood improves as her sleep improves with the intervention described below..  Will continue venlafaxine, bupropion, rexulti to target depression. Although it was previously advised to reconnect with Ms. Bynum for therapy, she did not pursue this, and Ms. Bynum is not taking any new patient at this time.    4. Insomnia, unspecified type Significant worsening in the setting of having difficulty in filling Sonata. She will be back on this medication to target insomnia.  She expressed understanding that this medication will be tapered off in the future to avoid the risk of dependence.  She was also advised again to contact her sleep specialist given she was recommended CPAP machine by her cardiologist according to the chart review.     Plan (she will contact the office if she needs a refill) Continue  venlafaxine 225 mg daily - it was confirmed with the patient that she has refills Continue bupropion 450 mg daily  Continue rexulti 1 mg daily  Continue Sonata 10 mg at night as needed for insomnia- [facility] Next appointment 8/8 at 4 pm for 30 mins, video (she declined in person visit due to work schedule) - on [employer] - on xanax 1 mg. 10 tabs per month, prescribed by her PCP   Past trials of medication: fluoxetine, duloxetine, Abilify (weight gain),  buspirone, Trazodone ("weird" feeling) temazepam, Ambien, Lunesta, Xanax      The patient demonstrates the following risk factors for suicide: Chronic risk factors for suicide include: psychiatric disorder of depression, anxiety and chronic pain. Acute risk factors for suicide include: N/A. Protective factors for this patient include: positive social support, responsibility to others (children, family), coping skills and hope for the future. Considering these factors, the overall suicide risk at this point appears to be low. Patient is appropriate for outpatient follow up.  Collaboration of Care: Collaboration of Care: {BH OP Collaboration of Care:21014065}  Patient/Guardian was advised Release of Information must be obtained prior to any record release in order to collaborate their care with an outside provider. Patient/Guardian was advised if they have not already done so to contact the registration department to sign all necessary forms in order for [place] to release information regarding their care.   Consent: Patient/Guardian gives verbal consent for treatment and assignment of benefits for services provided during this visit. Patient/Guardian expressed understanding and agreed  to proceed.    Neysa Hotter, MD 03/02/2023, 11:10 AM

## 2023-03-04 ENCOUNTER — Other Ambulatory Visit (HOSPITAL_COMMUNITY): Payer: Self-pay

## 2023-03-04 ENCOUNTER — Other Ambulatory Visit: Payer: Self-pay

## 2023-03-05 ENCOUNTER — Other Ambulatory Visit (HOSPITAL_COMMUNITY): Payer: Self-pay

## 2023-03-07 ENCOUNTER — Telehealth: Payer: 59 | Admitting: Psychiatry

## 2023-03-07 ENCOUNTER — Other Ambulatory Visit: Payer: Self-pay | Admitting: Psychiatry

## 2023-03-07 DIAGNOSIS — F5101 Primary insomnia: Secondary | ICD-10-CM

## 2023-03-08 ENCOUNTER — Telehealth: Payer: Self-pay | Admitting: Psychiatry

## 2023-03-08 NOTE — Telephone Encounter (Signed)
The order is already in the system to start today. I left a voice message informing her that the medication has been ordered and advised her to contact the clinic if she encounters any difficulties in filling the prescription

## 2023-03-08 NOTE — Telephone Encounter (Signed)
Patient called stating she needs a refill on the Zaleplon 10 mg. Please send to [facility] in [place]. She has no medication left.

## 2023-03-19 ENCOUNTER — Ambulatory Visit: Payer: 59 | Admitting: Family Medicine

## 2023-04-02 ENCOUNTER — Other Ambulatory Visit: Payer: Self-pay

## 2023-04-02 ENCOUNTER — Other Ambulatory Visit (HOSPITAL_COMMUNITY): Payer: Self-pay

## 2023-04-02 ENCOUNTER — Other Ambulatory Visit: Payer: Self-pay | Admitting: Family Medicine

## 2023-04-02 MED ORDER — MOUNJARO 10 MG/0.5ML ~~LOC~~ SOAJ
10.0000 mg | SUBCUTANEOUS | 0 refills | Status: DC
  Filled 2023-04-02: qty 2, 28d supply, fill #0

## 2023-04-03 ENCOUNTER — Other Ambulatory Visit (HOSPITAL_COMMUNITY): Payer: Self-pay

## 2023-04-22 NOTE — Progress Notes (Signed)
Virtual Visit via Video Note  I connected with Mary Roman on 04/29/23 at  2:30 PM EDT by a video enabled telemedicine application and verified that I am speaking with the correct person using two identifiers.  Location: Patient: home Provider: office Persons participated in the visit- patient, provider    I discussed the limitations of evaluation and management by telemedicine and the availability of in person appointments. The patient expressed understanding and agreed to proceed.    I discussed the assessment and treatment plan with the patient. The patient was provided an opportunity to ask questions and all were answered. The patient agreed with the plan and demonstrated an understanding of the instructions.   The patient was advised to call back or seek an in-person evaluation if the symptoms worsen or if the condition fails to improve as anticipated.  I provided 20 minutes of non-face-to-face time during this encounter.   Neysa Hotter, MD    [facility] MD/PA/NP OP Progress Note  04/29/2023 3:01 PM Mary Roman  MRN:  [id number]  Chief Complaint:  Chief Complaint  Patient presents with   Follow-up   HPI:  This is a follow-up appointment for depression, anxiety and insomnia.  She states that she is now working a new shift.  Working 10 hours, and has Mondays, and weekends off.  She has been trying to stay active on days off.  She enjoys decorating the house, which she has not done for a while due to depression in the past.  Her daughter has been doing good.  She works at [employer].  Her mother has been out of the hospital.  She thinks her mood has been overall good.  Work has kept herself busy.  She has been trying to think positively.  She has middle insomnia.  She feels depressed a few times per week.  She denies change in appetite.  She denies SI.  Has occasional anxiety.  She denies alcohol use or drug use.  She feels comfortable to stay on the current  medication regimen.   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  one son, one 93 yo daughter  Visit Diagnosis:    ICD-10-CM   1. MDD (major depressive disorder), recurrent episode, mild (HCC)  F33.0     2. GAD (generalized anxiety disorder)  F41.1     3. Insomnia, unspecified type  G47.00 Ambulatory referral to Neurology    4. Primary insomnia  F51.01 zaleplon (SONATA) 10 MG capsule      Past Psychiatric History: Please see initial evaluation for full details. I have reviewed the history. No updates at this time.     Past Medical History:  Past Medical History:  Diagnosis Date   Absolute anemia 06/23/2018   Added automatically from request for surgery [id number]   Anemia    Anxiety    Backache 12/24/2007   Chronic back pain    Depression    Diabetes mellitus, type II (HCC)    Headache(784.0)    Hypertension    Metabolic syndrome X 04/12/2014   Obesity    Palpitation    Sickle cell trait (HCC)    Thyromegaly    Unspecified vitamin D deficiency 02/19/2013    Past Surgical  History:  Procedure Laterality Date   COLONOSCOPY WITH PROPOFOL N/A 01/03/2021   Procedure: COLONOSCOPY WITH PROPOFOL;  Surgeon: Dolores Frame, MD;  Location: [facility];  Service: Gastroenterology;  Laterality: N/A;  AM   DILATATION AND CURETTAGE/HYSTEROSCOPY WITH MINERVA N/A 03/25/2019   Procedure: DILATATION AND CURETTAGE/HYSTEROSCOPY WITH  ATTEMPTED MINERVA ENDOMETRIAL ABLATION;  Surgeon: Lazaro Arms, MD;  Location: [facility] ORS;  Service: Gynecology;  Laterality: N/A;   None      Family Psychiatric History: Please see initial evaluation for full details. I have reviewed the history. No updates at this time.     Family History:   Family History  Problem Relation Age of Onset   Diabetes Mother    Hypertension Mother    Stroke Mother    Colon cancer Mother    Alcohol abuse Mother    Cancer - Colon Mother 67   Pneumonia Father    Depression Sister    Hypertension Sister    Anxiety disorder Sister    Hypertension Sister    Leukemia Sister    Heart attack Maternal Uncle    Hypertension Maternal Grandfather    Diabetes Maternal Grandmother    Hypertension Maternal Grandmother    Depression Child     Social History:  Social History   Socioeconomic History   Marital status: Single    Spouse name: Not on file   Number of children: 2   Years of education: Not on file   Highest education level: Not on file  Occupational History   Occupation: employed in medical office   Tobacco Use   Smoking status: Never   Smokeless tobacco: Never  Vaping Use   Vaping status: Never Used  Substance and Sexual Activity   Alcohol use: No   Drug use: No   Sexual activity: Yes    Partners: Male    Birth control/protection: Other-see comments    Comment: bf had vasectomy  Other Topics Concern   Not on file  Social History Narrative   Not on file   Social Determinants of Health   Financial Resource Strain: Medium Risk (03/08/2022)   Overall Financial Resource Strain (CARDIA)    Difficulty of Paying Living Expenses: Somewhat hard  Food Insecurity: Patient Declined (03/08/2022)   Hunger Vital Sign    Worried About Running Out of Food in the Last Year: Patient declined    Ran Out of Food in the Last Year: Patient declined  Transportation Needs: No Transportation Needs (03/08/2022)   PRAPARE - Administrator, Civil Service (Medical): No    Lack of Transportation (Non-Medical): No  Physical Activity: Inactive (03/08/2022)   Exercise Vital Sign    Days of Exercise per Week: 0 days    Minutes of Exercise per Session: 0 min  Stress: Stress Concern Present (03/08/2022)   [employer] of Occupational  Health - Occupational Stress Questionnaire    Feeling of Stress : Very much  Social Connections: Moderately Isolated (03/08/2022)   Social Connection and Isolation Panel [NHANES]    Frequency of Communication with Friends and Family: Twice a week    Frequency of Social Gatherings with Friends and Family: Once a week    Attends Religious Services: Never    Database administrator or Organizations: No    Attends Engineer, structural: Never    Marital Status: Living with partner    Allergies: No Known Allergies  Metabolic Disorder Labs: Lab Results  Component Value Date   HGBA1C  12.4 (H) 02/06/2023   MPG 174.29 04/10/2022   MPG 240.3 08/25/2021   No results found for: "PROLACTIN" Lab Results  Component Value Date   CHOL 122 10/17/2022   TRIG 77 10/17/2022   HDL 32 (L) 10/17/2022   CHOLHDL 3.8 10/17/2022   VLDL 11 02/05/2022   LDLCALC 74 10/17/2022   LDLCALC 76 02/05/2022   Lab Results  Component Value Date   TSH 0.924 10/17/2022   TSH 1.564 08/25/2021    Therapeutic Level Labs: No results found for: "LITHIUM" No results found for: "VALPROATE" No results found for: "CBMZ"  Current Medications: Current Outpatient Medications  Medication Sig Dispense Refill   acetaminophen (TYLENOL) 500 MG tablet Take 1,000 mg by mouth every 6 (six) hours as needed (pain).     ALPRAZolam (XANAX) 1 MG tablet Take half to one tablet by mouth once weekly, as needed, for sleep 12 tablet 0   aspirin EC 81 MG tablet Take 1 tablet (81 mg total) by mouth daily. (Patient taking differently: Take 81 mg by mouth in the morning.) 90 tablet 3   benzonatate (TESSALON) 100 MG capsule Take 1 capsule (100 mg total) by mouth 3 (three) times daily as needed. 30 capsule 0   blood glucose meter kit and supplies Dispense based on patient and insurance preference. Once daily testing dx e11.9 1 each 0   Blood Pressure KIT Use to check blood pressure daily. 1 kit 0   buPROPion (WELLBUTRIN XL) 150 MG 24 hr  tablet Take 3 tablets (450 mg total) by mouth daily. 270 tablet 1   empagliflozin (JARDIANCE) 25 MG TABS tablet Take 1 tablet (25 mg total) by mouth daily before breakfast. 90 tablet 3   fluconazole (DIFLUCAN) 150 MG tablet Take 1 tablet (150 mg total) by mouth every 3 (three) days as needed. 2 tablet 0   fluticasone (FLONASE) 50 MCG/ACT nasal spray Place 2 sprays into both nostrils daily. 16 g 6   glipiZIDE (GLUCOTROL XL) 10 MG 24 hr tablet Take 2 tablets (20 mg total) by mouth in the morning with breakfast. 180 tablet 1   guaiFENesin (ROBITUSSIN) 100 MG/5ML liquid Take 5 mLs by mouth every 4 (four) hours as needed for cough or to loosen phlegm. 120 mL 0   megestrol (MEGACE) 40 MG tablet TAKE 1 TABLET BY MOUTH IN THE MORNING 30 tablet 11   metoprolol tartrate (LOPRESSOR) 25 MG tablet Take 1 tablet (25 mg total) by mouth 2 (two) times daily. Please schedule appointment for future refills. Thank you 180 tablet 1   montelukast (SINGULAIR) 10 MG tablet Take 1 tablet (10 mg total) by mouth at bedtime. 30 tablet 3   olmesartan (BENICAR) 20 MG tablet Take 1 tablet (20 mg total) by mouth daily. 90 tablet 1   ondansetron (ZOFRAN) 4 MG tablet TAKE 1 TABLET BY MOUTH ONCE DAILY AS NEEDED FOR NAUSEA 20 tablet 0   potassium chloride SA (KLOR-CON M) 20 MEQ tablet Take 1 tablet (20 mEq total) by mouth daily. 30 tablet 3   rosuvastatin (CRESTOR) 5 MG tablet Take 1 tablet (5 mg total) by mouth daily. 90 tablet 3   tirzepatide (MOUNJARO) 10 MG/0.5ML Pen Inject 10 mg into the skin once a week. 2 mL 0   tirzepatide (MOUNJARO) 12.5 MG/0.5ML Pen Inject 12.5 mg into the skin once a week. 2 mL 1   triamterene-hydrochlorothiazide (MAXZIDE-25) 37.5-25 MG tablet Take 1 tablet by mouth daily. 90 tablet 3   venlafaxine XR (EFFEXOR-XR) 150 MG 24 hr  capsule Take 1 capsule (150 mg total) by mouth daily. 90 capsule 1   venlafaxine XR (EFFEXOR-XR) 75 MG 24 hr capsule Take 1 capsule (75 mg total) by mouth daily. Total of 225 mg  daily. Take along with 150 mg cap 90 capsule 1   [START ON 05/10/2023] zaleplon (SONATA) 10 MG capsule Take 1 capsule (10 mg total) by mouth at bedtime as needed for sleep. 30 capsule 1   No current facility-administered medications for this visit.     Musculoskeletal: Strength & Muscle Tone:  N/A Gait & Station:  N/A Patient leans: N/A  Psychiatric Specialty Exam: Review of Systems  Psychiatric/Behavioral:  Positive for dysphoric mood and sleep disturbance. Negative for agitation, behavioral problems, confusion, decreased concentration, hallucinations, self-injury and suicidal ideas. The patient is nervous/anxious. The patient is not hyperactive.   All other systems reviewed and are negative.   There were no vitals taken for this visit.There is no height or weight on file to calculate BMI.  General Appearance: Well Groomed  Eye Contact:  Good  Speech:  Clear and Coherent  Volume:  Normal  Mood:   good  Affect:  Appropriate, Congruent, and calm  Thought Process:  Coherent  Orientation:  Full (Time, Place, and Person)  Thought Content: Logical   Suicidal Thoughts:  No  Homicidal Thoughts:  No  Memory:  Immediate;   Good  Judgement:  Good  Insight:  Good  Psychomotor Activity:  Normal  Concentration:  Concentration: Good and Attention Span: Good  Recall:  Good  Fund of Knowledge: Good  Language: Good  Akathisia:  No  Handed:  Right  AIMS (if indicated): not done  Assets:  Communication Skills Desire for Improvement  ADL's:  Intact  Cognition: WNL  Sleep:  Poor   Screenings: GAD-7    Flowsheet Row Office Visit from 02/06/2023 in [facility] Office Visit from 10/17/2022 in [facility] Office Visit from 07/10/2022 in [facility] Office Visit from 03/08/2022 in [facility] Office Visit from 02/07/2022 in [facility]  Total GAD-7 Score 6 4  7 16 12       PHQ2-9    Flowsheet Row Office Visit from 02/06/2023 in [facility] Office Visit from 10/17/2022 in [facility] Video Visit from 07/24/2022 in [facility] Office Visit from 07/10/2022 in [facility] Office Visit from 04/12/2022 in [facility]  PHQ-2 Total Score 2 2 0 2 3  PHQ-9 Total Score 9 8 0 8 9      Flowsheet Row Video Visit from 09/14/2021 in [facility] Video Visit from 08/10/2021 in [facility] Video Visit from 05/04/2021 in [facility]  C-SSRS RISK CATEGORY Error: Q3, 4, or 5 should not be populated when Q2 is No No Risk Low Risk        Assessment and Plan:  Mary Roman is a 52 y.o. year old female with a history of depression, mild OSA, diabetes, who presents for follow up appointment for below.   1. MDD (major depressive disorder), recurrent episode, mild (HCC) 2. GAD (generalized anxiety disorder) Acute stressors include: her mother with cancer, proceeding with biopsy, her fiancee with pending knee replacement due to diabetes, work related stress due to shortage of staff ,conflict with one  of her sister  Other stressors include: financial strain, difficulty in communication with her daughter, conflict with her siblings   History:   There has been overall improvement in her mood symptoms since the last visit.  Will continue venlafaxine, bupropion, rexulti to target depression.  She is willing to wait contact with Ms. Bynum; she was advised to contact the office to see if she can have a follow-up appointment.   3. Insomnia, unspecified type Unstable.  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  Will order referral for an evaluation of sleep apnea.  Will continue Sonata as needed for insomnia.  It  is discussed with the patient to consider tapering off in the future as there is limited data for long-term effect, and to mitigate any side effects.     Plan (she will contact the office if she needs a refill) Continue venlafaxine 225 mg daily  Continue bupropion 450 mg daily  Continue rexulti 1 mg daily  Continue Sonata 10 mg at night as needed for insomnia- [facility] Referral for re-evaluation of sleep apnea Next appointment: 11/25 at 10 am for 30 mins, IP - on Mounjaro    Past trials of medication: fluoxetine, duloxetine, Abilify (weight gain),  buspirone, Trazodone ("weird" feeling) temazepam, Ambien, Lunesta, Xanax      The patient demonstrates the following risk factors for suicide: Chronic risk factors for suicide include: psychiatric disorder of depression, anxiety and chronic pain. Acute risk factors for suicide include: N/A. Protective factors for this patient include: positive social support, responsibility to others (children, family), coping skills and hope for the future. Considering these factors, the overall suicide risk at this point appears to be low. Patient is appropriate for outpatient follow up.      Collaboration of Care: Collaboration of Care: Other reviewed notes in Epic  Patient/Guardian was advised Release of Information must be obtained prior to any record release in order to collaborate their care with an outside provider. Patient/Guardian was advised if they have not already done so to contact the registration department to sign all necessary forms in order for [place] to release information regarding their care.   Consent: Patient/Guardian gives verbal consent for treatment and assignment of benefits for services provided during this visit. Patient/Guardian expressed understanding and agreed to proceed.    Neysa Hotter, MD 04/29/2023, 3:01 PM

## 2023-04-29 ENCOUNTER — Encounter: Payer: Self-pay | Admitting: Psychiatry

## 2023-04-29 ENCOUNTER — Telehealth (INDEPENDENT_AMBULATORY_CARE_PROVIDER_SITE_OTHER): Payer: 59 | Admitting: Psychiatry

## 2023-04-29 DIAGNOSIS — F5101 Primary insomnia: Secondary | ICD-10-CM

## 2023-04-29 DIAGNOSIS — F330 Major depressive disorder, recurrent, mild: Secondary | ICD-10-CM | POA: Diagnosis not present

## 2023-04-29 DIAGNOSIS — G4700 Insomnia, unspecified: Secondary | ICD-10-CM

## 2023-04-29 DIAGNOSIS — F411 Generalized anxiety disorder: Secondary | ICD-10-CM | POA: Diagnosis not present

## 2023-04-29 MED ORDER — ZALEPLON 10 MG PO CAPS: 10.0000 mg | ORAL_CAPSULE | Freq: Every evening | ORAL | 1 refills | Status: DC | PRN

## 2023-04-29 NOTE — Patient Instructions (Signed)
Continue venlafaxine 225 mg daily  Continue bupropion 450 mg daily  Continue rexulti 1 mg daily  Continue Sonata 10 mg at night as needed for insomnia-  Referral for re-evaluation of sleep apnea Next appointment: 11/25 at 10 am

## 2023-05-02 ENCOUNTER — Ambulatory Visit: Payer: 59 | Admitting: Family Medicine

## 2023-05-08 ENCOUNTER — Other Ambulatory Visit (HOSPITAL_COMMUNITY): Payer: Self-pay

## 2023-06-06 ENCOUNTER — Encounter: Payer: Self-pay | Admitting: Family Medicine

## 2023-06-06 ENCOUNTER — Ambulatory Visit: Payer: 59 | Admitting: Family Medicine

## 2023-06-06 ENCOUNTER — Other Ambulatory Visit: Payer: Self-pay

## 2023-06-06 VITALS — BP 122/75 | HR 79 | Ht 65.0 in | Wt 197.0 lb

## 2023-06-06 DIAGNOSIS — R4689 Other symptoms and signs involving appearance and behavior: Secondary | ICD-10-CM

## 2023-06-06 DIAGNOSIS — E785 Hyperlipidemia, unspecified: Secondary | ICD-10-CM

## 2023-06-06 DIAGNOSIS — E559 Vitamin D deficiency, unspecified: Secondary | ICD-10-CM | POA: Diagnosis not present

## 2023-06-06 DIAGNOSIS — E1159 Type 2 diabetes mellitus with other circulatory complications: Secondary | ICD-10-CM

## 2023-06-06 DIAGNOSIS — I10 Essential (primary) hypertension: Secondary | ICD-10-CM

## 2023-06-06 DIAGNOSIS — F331 Major depressive disorder, recurrent, moderate: Secondary | ICD-10-CM

## 2023-06-06 DIAGNOSIS — F411 Generalized anxiety disorder: Secondary | ICD-10-CM | POA: Diagnosis not present

## 2023-06-06 MED ORDER — ONDANSETRON HCL 4 MG PO TABS: ORAL_TABLET | ORAL | 1 refills | Status: DC

## 2023-06-06 NOTE — Patient Instructions (Signed)
F/U in 8  weeks, call if you need me sooner  PLEASE drink only no cal drinks, allow 1 cheat day  Test sugar and record at bedtime, goal rNGE IS 1130 TO 170  YOU ARE BEING REFERRED TO THERAPIST  PLS start walking on  your days off , 30 mins for each of the 3 days  Think about what you will eat, plan ahead. Choose " clean, green, fresh or frozen" over canned, processed or packaged foods which are more sugary, salty and fatty. 70 to 75% of food eaten should be vegetables and fruit. Three meals at set times with snacks allowed between meals, but they must be fruit or vegetables. Aim to eat over a 12 hour period , example 7 am to 7 pm, and STOP after  your last meal of the day. Drink water,generally about 64 ounces per day, no other drink is as healthy. Fruit juice is best enjoyed in a healthy way, by EATING the fruit.   Thanks for choosing [facility], we consider it a privelige to serve you.

## 2023-06-07 ENCOUNTER — Other Ambulatory Visit (HOSPITAL_COMMUNITY): Payer: Self-pay

## 2023-06-07 ENCOUNTER — Other Ambulatory Visit: Payer: Self-pay

## 2023-06-07 ENCOUNTER — Other Ambulatory Visit: Payer: Self-pay | Admitting: Family Medicine

## 2023-06-07 MED ORDER — MOUNJARO 10 MG/0.5ML ~~LOC~~ SOAJ
10.0000 mg | SUBCUTANEOUS | 0 refills | Status: DC
  Filled 2023-06-07: qty 2, 28d supply, fill #0

## 2023-06-08 LAB — CMP14+EGFR
ALT: 17 IU/L (ref 0–32)
AST: 16 IU/L (ref 0–40)
Albumin: 3.8 g/dL (ref 3.8–4.9)
Alkaline Phosphatase: 103 [IU]/L (ref 44–121)
BUN/Creatinine Ratio: 9 (ref 9–23)
BUN: 9 mg/dL (ref 6–24)
Bilirubin Total: 0.4 mg/dL (ref 0.0–1.2)
CO2: 21 mmol/L (ref 20–29)
Calcium: 9 mg/dL (ref 8.7–10.2)
Chloride: 100 mmol/L (ref 96–106)
Creatinine, Ser: 0.99 mg/dL (ref 0.57–1.00)
Globulin, Total: 2.7 g/dL (ref 1.5–4.5)
Glucose: 478 mg/dL — ABNORMAL HIGH (ref 70–99)
Potassium: 3.9 mmol/L (ref 3.5–5.2)
Sodium: 137 mmol/L (ref 134–144)
Total Protein: 6.5 g/dL (ref 6.0–8.5)
eGFR: 68 mL/min/{1.73_m2} (ref >59)

## 2023-06-08 LAB — CBC
Hematocrit: 37.3 % (ref 34.0–46.6)
Hemoglobin: 11.8 g/dL (ref 11.1–15.9)
MCH: 29.4 pg (ref 26.6–33.0)
MCHC: 31.6 g/dL (ref 31.5–35.7)
MCV: 93 fL (ref 79–97)
Platelets: 448 10*3/uL (ref 150–450)
RBC: 4.02 x10E6/uL (ref 3.77–5.28)
RDW: 13.1 % (ref 11.7–15.4)
WBC: 7.1 10*3/uL (ref 3.4–10.8)

## 2023-06-08 LAB — BAYER DCA HB A1C WAIVED: HB A1C (BAYER DCA - WAIVED): 12.2 % — ABNORMAL HIGH (ref 4.8–5.6)

## 2023-06-08 LAB — MICROALBUMIN / CREATININE URINE RATIO
Creatinine, Urine: 23.6 mg/dL
Microalb/Creat Ratio: <13 mg/g{creat} (ref 0–29)
Microalbumin, Urine: <3 ug/mL

## 2023-06-08 LAB — LIPID PANEL
Chol/HDL Ratio: 5.3 ratio — ABNORMAL HIGH (ref 0.0–4.4)
Cholesterol, Total: 144 mg/dL (ref 100–199)
HDL: 27 mg/dL — ABNORMAL LOW (ref >39)
LDL Chol Calc (NIH): 87 mg/dL (ref 0–99)
Triglycerides: 171 mg/dL — ABNORMAL HIGH (ref 0–149)
VLDL Cholesterol Cal: 30 mg/dL (ref 5–40)

## 2023-06-08 LAB — VITAMIN D 25 HYDROXY (VIT D DEFICIENCY, FRACTURES): Vit D, 25-Hydroxy: 20.3 ng/mL — ABNORMAL LOW (ref 30.0–100.0)

## 2023-06-09 DIAGNOSIS — R4689 Other symptoms and signs involving appearance and behavior: Secondary | ICD-10-CM | POA: Insufficient documentation

## 2023-06-09 MED ORDER — VITAMIN D (ERGOCALCIFEROL) 1.25 MG (50000 UNIT) PO CAPS
50000.0000 [IU] | ORAL_CAPSULE | ORAL | 1 refills | Status: DC
  Filled 2023-06-09: qty 12, 84d supply, fill #0

## 2023-06-09 MED ORDER — ROSUVASTATIN CALCIUM 10 MG PO TABS
10.0000 mg | ORAL_TABLET | Freq: Every day | ORAL | 1 refills | Status: DC
  Filled 2023-06-09: qty 90, 90d supply, fill #0
  Filled 2023-07-31 – 2023-12-16 (×2): qty 90, 90d supply, fill #1

## 2023-06-09 MED ORDER — ROSUVASTATIN CALCIUM 10 MG PO TABS: 10.0000 mg | ORAL_TABLET | Freq: Every day | ORAL | 1 refills | Status: DC

## 2023-06-09 MED ORDER — VITAMIN D (ERGOCALCIFEROL) 1.25 MG (50000 UNIT) PO CAPS: 50000.0000 [IU] | ORAL_CAPSULE | ORAL | 1 refills | Status: DC

## 2023-06-09 NOTE — Assessment & Plan Note (Signed)
Controlled, no change in medication DASH diet and commitment to daily physical activity for a minimum of 30 minutes discussed and encouraged, as a part of hypertension management. The importance of attaining a healthy weight is also discussed.     06/06/2023    9:00 AM 02/06/2023    3:12 PM 02/06/2023    3:11 PM 02/06/2023    2:48 PM 02/06/2023    2:46 PM 10/17/2022    2:59 PM 07/10/2022    3:03 PM  BP/Weight  Systolic BP 122 140 142 152 169 128 140  Diastolic BP 75 94 92 89 84 74 88  Wt. (Lbs) 197    202.04 192.12   BMI 32.78 kg/m2    33.62 kg/m2 31.97 kg/m2

## 2023-06-09 NOTE — Assessment & Plan Note (Signed)
Hyperlipidemia:Low fat diet discussed and encouraged.   Lipid Panel  Lab Results  Component Value Date   CHOL 144 06/06/2023   HDL 27 (L) 06/06/2023   LDLCALC 87 06/06/2023   TRIG 171 (H) 06/06/2023   CHOLHDL 5.3 (H) 06/06/2023   Uncontrolled inc crestor dose and exercise and lower fat intake

## 2023-06-09 NOTE — Assessment & Plan Note (Signed)
Improved and managed by Psych ?

## 2023-06-09 NOTE — Progress Notes (Signed)
   Mary Roman     MRN: [id number]      DOB: Apr 08, 1971  Chief Complaint  Patient presents with   Follow-up    Follow up    HPI [name] is here for follow up and re-evaluation of chronic medical conditions, medication management and review of any available recent lab and radiology data.  Preventive health is updated, specifically  Cancer screening and Immunization.   Remains stressed and reports non compliance both with medications and with diet, also not testing blood sugar and believes that it is high  C/o intolerance of higher dose of mounjaro due to nausea and requests dose reduction states takes meds every 2 weeks dur to the nausea  ROS Denies recent fever or chills. Denies sinus pressure, nasal congestion, ear pain or sore throat. Denies chest congestion, productive cough or wheezing. Denies chest pains, palpitations and leg swelling Denies abdominal pain, ancy or incontinence. Denies joint pain, swelling and limitation in mobility. Denies headaches, seizures, numbness, or tingling. Denies  uncontrolled depression, anxiety or insomnia. Denies skin break down or rash.   PE  BP 122/75 (BP Location: Right Arm, Patient Position: Sitting, Cuff Size: Large)   Pulse 79   Ht 5\' 5"  (1.651 m)   Wt 197 lb (89.4 kg)   SpO2 96%   BMI 32.78 kg/m   Patient alert and oriented and in no cardiopulmonary distress.  HEENT: No facial asymmetry, EOMI,     Neck supple .  Chest: Clear to auscultation bilaterally.  CVS: S1, S2 no murmurs, no S3.Regular rate.  ABD: Soft non tender.   Ext: No edema  MS: Adequate ROM spine, shoulders, hips and knees.  Skin: Intact, no ulcerations or rash noted.  Psych: Good eye contact, normal affect. Memory intact not anxious or depressed appearing.  CNS: CN 2-12 intact, power,  normal throughout.no focal deficits noted.   Assessment & Plan  Essential hypertension Controlled, no change in medication DASH diet and commitment to  daily physical activity for a minimum of 30 minutes discussed and encouraged, as a part of hypertension management. The importance of attaining a healthy weight is also discussed.     06/06/2023    9:00 AM 02/06/2023    3:12 PM 02/06/2023    3:11 PM 02/06/2023    2:48 PM 02/06/2023    2:46 PM 10/17/2022    2:59 PM 07/10/2022    3:03 PM  BP/Weight  Systolic BP 122 140 142 152 169 128 140  Diastolic BP 75 94 92 89 84 74 88  Wt. (Lbs) 197    202.04 192.12   BMI 32.78 kg/m2    33.62 kg/m2 31.97 kg/m2        Dyslipidemia (high LDL; low HDL) Hyperlipidemia:Low fat diet discussed and encouraged.   Lipid Panel  Lab Results  Component Value Date   CHOL 144 06/06/2023   HDL 27 (L) 06/06/2023   LDLCALC 87 06/06/2023   TRIG 171 (H) 06/06/2023   CHOLHDL 5.3 (H) 06/06/2023   Uncontrolled inc crestor dose and exercise and lower fat intake    Major depressive disorder, recurrent episode, moderate (HCC) Improved and managed by Psych  GAD (generalized anxiety disorder) Improved and manages by psych  Non-compliant behavior Reports non compiance with diabtes management with ongoing deterioration in control, recommend Endo, states she wants " one more chance" to do as she should, will also refer her educator

## 2023-06-09 NOTE — Assessment & Plan Note (Signed)
Reports non compiance with diabtes management with ongoing deterioration in control, recommend Endo, states she wants " one more chance" to do as she should, will also refer her educator

## 2023-06-09 NOTE — Assessment & Plan Note (Signed)
Improved and manages by psych

## 2023-06-10 ENCOUNTER — Other Ambulatory Visit: Payer: Self-pay

## 2023-06-10 ENCOUNTER — Other Ambulatory Visit (HOSPITAL_COMMUNITY): Payer: Self-pay

## 2023-06-12 ENCOUNTER — Encounter: Payer: Self-pay | Admitting: Pharmacist

## 2023-06-12 ENCOUNTER — Other Ambulatory Visit (HOSPITAL_COMMUNITY): Payer: Self-pay

## 2023-06-12 ENCOUNTER — Other Ambulatory Visit: Payer: Self-pay

## 2023-06-17 ENCOUNTER — Other Ambulatory Visit: Payer: Self-pay

## 2023-06-20 NOTE — Progress Notes (Unsigned)
No show

## 2023-06-24 ENCOUNTER — Ambulatory Visit (INDEPENDENT_AMBULATORY_CARE_PROVIDER_SITE_OTHER): Payer: 59 | Admitting: Psychiatry

## 2023-06-24 DIAGNOSIS — Z91199 Patient's noncompliance with other medical treatment and regimen due to unspecified reason: Secondary | ICD-10-CM

## 2023-07-01 ENCOUNTER — Other Ambulatory Visit (HOSPITAL_COMMUNITY)
Admission: RE | Admit: 2023-07-01 | Discharge: 2023-07-01 | Disposition: A | Discharge status: Home / Self Care | Payer: 59 | Source: Ambulatory Visit | Attending: Obstetrics & Gynecology | Admitting: Obstetrics & Gynecology

## 2023-07-01 ENCOUNTER — Ambulatory Visit (INDEPENDENT_AMBULATORY_CARE_PROVIDER_SITE_OTHER): Payer: 59 | Admitting: Obstetrics & Gynecology

## 2023-07-01 ENCOUNTER — Other Ambulatory Visit: Payer: Self-pay | Admitting: Psychiatry

## 2023-07-01 ENCOUNTER — Encounter: Payer: Self-pay | Admitting: Obstetrics & Gynecology

## 2023-07-01 VITALS — BP 158/83 | HR 86 | Ht 65.0 in | Wt 198.4 lb

## 2023-07-01 DIAGNOSIS — B3731 Acute candidiasis of vulva and vagina: Secondary | ICD-10-CM

## 2023-07-01 DIAGNOSIS — Z01411 Encounter for gynecological examination (general) (routine) with abnormal findings: Secondary | ICD-10-CM

## 2023-07-01 DIAGNOSIS — F5101 Primary insomnia: Secondary | ICD-10-CM

## 2023-07-01 DIAGNOSIS — N921 Excessive and frequent menstruation with irregular cycle: Secondary | ICD-10-CM

## 2023-07-01 MED ORDER — MEGESTROL ACETATE 40 MG PO TABS
40.0000 mg | ORAL_TABLET | Freq: Every morning | ORAL | 4 refills | Status: AC
Start: 2023-07-01 — End: 2023-09-29

## 2023-07-01 MED ORDER — FLUCONAZOLE 150 MG PO TABS: ORAL_TABLET | ORAL | 4 refills | Status: DC

## 2023-07-01 NOTE — Telephone Encounter (Signed)
Could you contact her to make a follow up for 30 mins, in person visit? Plan to make a refill to last until the next visit.

## 2023-07-01 NOTE — Progress Notes (Signed)
WELL-WOMAN EXAMINATION Patient name: Mary Roman MRN [id number]  Date of birth: Oct 10, 1970 Chief Complaint:   Gynecologic Exam  History of Present Illness:   Mary Roman is a 52 y.o. [phone] PM female being seen today for a routine well-woman exam.   Previously seen by Dr. Despina Hidden due to AUB- pt has been on megace- currently taking one tablet daily.  With this medication she does not have any vaginal bleeding  Previously noted daily bleeding that had led to anemia.  Previously had an ablation which was not successful.    Last 2 weeks notes burning and itching a little bit of discharge.  Using monistat which calms it down, but comes right back.  A few mos ago did an e-visit and was given Diflucan which did help, but symptoms have returned.   Patient's last menstrual period was 01/18/2019.  The current method of family planning is  perimenopause .    Last pap 02/2022.  Last mammogram: 10/2022. Last colonoscopy: 12/2020     07/01/2023    2:29 PM 06/06/2023    9:01 AM 02/06/2023    2:47 PM 10/17/2022    3:00 PM 07/24/2022    1:16 PM  Depression screen PHQ 2/9  Decreased Interest 1 0 1 1 0  Down, Depressed, Hopeless 1 0 1 1 0  PHQ - 2 Score 2 0 2 2 0  Altered sleeping 2  2 1  0  Tired, decreased energy 1  2 1  0  Change in appetite 0  2 2 0  Feeling bad or failure about yourself  0  1 1 0  Trouble concentrating 1  0 1 0  Moving slowly or fidgety/restless 0  0 0 0  Suicidal thoughts 0  0 0 0  PHQ-9 Score 6  9 8  0  Difficult doing work/chores   Somewhat difficult Somewhat difficult       Review of Systems:   Pertinent items are noted in HPI Denies any headaches, blurred vision, fatigue, shortness of breath, chest pain, abdominal pain, bowel movements, urination, or intercourse unless otherwise stated above.  Pertinent History Reviewed:  Reviewed past medical,surgical, social and family history.  Reviewed problem list, medications and allergies. Physical  Assessment:   Vitals:   07/01/23 1350  BP: (!) 158/83  Pulse: 86  Weight: 198 lb 6.4 oz (90 kg)  Height: 5\' 5"  (1.651 m)  Body mass index is 33.02 kg/m.        Physical Examination:   General appearance - well appearing, and in no distress  Mental status - alert, oriented to person, place, and time  Psych:  She has a normal mood and affect  Skin - warm and dry, normal color, no suspicious lesions noted  Chest - effort normal, all lung fields clear to auscultation bilaterally  Heart - normal rate and regular rhythm  Neck:  midline trachea, no thyromegaly or nodules  Breasts - breasts appear normal, no suspicious masses, no skin or nipple changes or  axillary nodes  Abdomen - soft, nontender, nondistended, no masses or organomegaly  Pelvic - VULVA: Pink appearance to perineal area VAGINA: Appears somewhat inflamed and erythematous, clumpy white discharge noted CERVIX: normal appearing cervix without discharge or lesions, no CMT   UTERUS: uterus is felt to be normal size, shape, consistency and nontender   ADNEXA: No adnexal masses or tenderness noted.  Extremities:  No swelling or varicosities noted  Chaperone: [name]     Assessment & Plan:  1) Well-Woman Exam -Pap up-to-date -Mammogram and colonoscopy up-to-date  2) acute vaginitis -Findings suggestive of a yeast infection, vaginitis panel obtained -Due to severity will send in treatment x 3-day refills to take as needed -Discussed how her diabetes plays a role  3) AUB reviewed prior [facility] which was still within the premenopausal stage\ -For now we will plan to continue with Megace -In and will consider discontinuing Megace in the future   No orders of the defined types were placed in this encounter.   Meds:  Meds ordered this encounter  Medications   fluconazole (DIFLUCAN) 150 MG tablet    Sig: Take one tablet once then repeat in 3 days (for 3 doses)    Dispense:  3 tablet    Refill:  4   megestrol  (MEGACE) 40 MG tablet    Sig: Take 1 tablet (40 mg total) by mouth every morning.    Dispense:  90 tablet    Refill:  4    Follow-up: Return in about 1 year (around 06/30/2024) for Annual.   Mary Hidalgo, DO Attending Obstetrician & Gynecologist, [facility] for [facility], [facility]

## 2023-07-02 ENCOUNTER — Other Ambulatory Visit: Payer: Self-pay | Admitting: Psychiatry

## 2023-07-02 MED ORDER — ZALEPLON 10 MG PO CAPS: 10.0000 mg | ORAL_CAPSULE | Freq: Every evening | ORAL | 0 refills | Status: DC | PRN

## 2023-07-02 NOTE — Telephone Encounter (Signed)
Message left to call and schedule appointment 

## 2023-07-03 ENCOUNTER — Other Ambulatory Visit: Payer: Self-pay | Admitting: Obstetrics & Gynecology

## 2023-07-03 LAB — CERVICOVAGINAL ANCILLARY ONLY
Bacterial Vaginitis (gardnerella): NEGATIVE
Candida Glabrata: NEGATIVE
Candida Vaginitis: POSITIVE — AB
Comment: NEGATIVE
Comment: NEGATIVE
Comment: NEGATIVE

## 2023-07-12 ENCOUNTER — Encounter: Payer: Self-pay | Admitting: Obstetrics & Gynecology

## 2023-07-15 ENCOUNTER — Other Ambulatory Visit: Payer: Self-pay | Admitting: Obstetrics & Gynecology

## 2023-07-15 DIAGNOSIS — B3731 Acute candidiasis of vulva and vagina: Secondary | ICD-10-CM

## 2023-07-15 MED ORDER — FLUCONAZOLE 150 MG PO TABS: ORAL_TABLET | ORAL | 1 refills | Status: DC

## 2023-07-15 NOTE — Progress Notes (Signed)
Spoke to patient plan to refill Diflucan Symptoms have improved, but she does not yet have complete resolution  Myna Hidalgo, DO Attending Obstetrician & Gynecologist, [facility] for [employer], [facility]

## 2023-07-31 ENCOUNTER — Other Ambulatory Visit: Payer: Self-pay | Admitting: Family Medicine

## 2023-07-31 ENCOUNTER — Other Ambulatory Visit: Payer: Self-pay | Admitting: Cardiology

## 2023-07-31 ENCOUNTER — Other Ambulatory Visit: Payer: Self-pay | Admitting: Psychiatry

## 2023-08-01 ENCOUNTER — Other Ambulatory Visit (HOSPITAL_COMMUNITY): Payer: Self-pay

## 2023-08-01 ENCOUNTER — Other Ambulatory Visit (HOSPITAL_BASED_OUTPATIENT_CLINIC_OR_DEPARTMENT_OTHER): Payer: Self-pay

## 2023-08-01 ENCOUNTER — Other Ambulatory Visit: Payer: Self-pay | Admitting: Family Medicine

## 2023-08-01 ENCOUNTER — Other Ambulatory Visit: Payer: Self-pay

## 2023-08-01 MED ORDER — MOUNJARO 10 MG/0.5ML ~~LOC~~ SOAJ
10.0000 mg | SUBCUTANEOUS | 0 refills | Status: DC
  Filled 2023-08-01: qty 2, 28d supply, fill #0

## 2023-08-01 MED ORDER — METOPROLOL TARTRATE 25 MG PO TABS
25.0000 mg | ORAL_TABLET | Freq: Two times a day (BID) | ORAL | 1 refills | Status: DC
  Filled 2023-08-01: qty 180, 90d supply, fill #0
  Filled 2024-01-22: qty 180, 90d supply, fill #1

## 2023-08-01 MED ORDER — BUPROPION HCL ER (XL) 150 MG PO TB24
450.0000 mg | ORAL_TABLET | Freq: Every day | ORAL | 1 refills | Status: DC
  Filled 2023-08-01: qty 270, 90d supply, fill #0
  Filled 2024-01-22: qty 270, 90d supply, fill #1

## 2023-08-05 ENCOUNTER — Ambulatory Visit: Payer: 59 | Admitting: Nutrition

## 2023-08-19 ENCOUNTER — Ambulatory Visit: Payer: 59 | Admitting: Psychiatry

## 2023-08-22 ENCOUNTER — Ambulatory Visit: Payer: 59 | Admitting: Family Medicine

## 2023-08-27 NOTE — Progress Notes (Deleted)
 [facility] MD/PA/NP OP Progress Note  08/27/2023 3:18 PM Mary Roman  MRN:  [id number]  Chief Complaint: No chief complaint on file.  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  one son, one 54 yo daughter  Visit Diagnosis: No diagnosis found.  Past Psychiatric History: Please see initial evaluation for full details. I have reviewed the history. No updates at this time.     Past Medical History:  Past Medical History:  Diagnosis Date   Absolute anemia 06/23/2018   Added automatically from request for surgery [id number]   Anemia    Anxiety    Backache 12/24/2007   Chronic back pain    Depression    Diabetes mellitus, type II (HCC)    Headache(784.0)    Hypertension    Metabolic syndrome X 04/12/2014   Obesity    Palpitation    Sickle cell trait (HCC)    Thyromegaly    Unspecified vitamin D deficiency 02/19/2013    Past Surgical History:  Procedure Laterality Date   COLONOSCOPY WITH PROPOFOL N/A 01/03/2021   Procedure: COLONOSCOPY WITH PROPOFOL;  Surgeon: Dolores Frame, MD;  Location: [facility] SUITE;  Service: Gastroenterology;  Laterality: N/A;  AM   DILATATION AND CURETTAGE/HYSTEROSCOPY WITH MINERVA N/A 03/25/2019   Procedure: DILATATION AND CURETTAGE/HYSTEROSCOPY WITH  ATTEMPTED MINERVA ENDOMETRIAL ABLATION;  Surgeon: Lazaro Arms, MD;  Location: [facility] ORS;  Service: Gynecology;  Laterality: N/A;   None      Family Psychiatric History: Please see initial evaluation for full details. I have reviewed the history. No updates at this time.     Family History:  Family History  Problem Relation Age of Onset   Diabetes Mother    Hypertension Mother    Stroke Mother    Colon cancer Mother    Alcohol abuse Mother    Cancer - Colon Mother 56   Pneumonia Father    Depression Sister    Hypertension Sister    Anxiety disorder Sister     Hypertension Sister    Leukemia Sister    Heart attack Maternal Uncle    Hypertension Maternal Grandfather    Diabetes Maternal Grandmother    Hypertension Maternal Grandmother    Depression Child     Social History:  Social History   Socioeconomic History   Marital status: Single    Spouse name: Not on file   Number of children: 2   Years of education: Not on file   Highest education level: Not on file  Occupational History   Occupation: employed in medical office   Tobacco Use   Smoking status: Never   Smokeless tobacco: Never  Vaping Use   Vaping status: Never Used  Substance and Sexual Activity   Alcohol use: No   Drug use: No   Sexual activity: Yes    Partners: Male    Birth control/protection: Other-see comments    Comment: bf had vasectomy  Other Topics Concern   Not on file  Social History Narrative   Not on file   Social Drivers of Health   Financial Resource Strain: Low Risk  (07/01/2023)   Overall Financial Resource Strain (CARDIA)    Difficulty of Paying Living Expenses: Not very hard  Food Insecurity: No Food Insecurity (07/01/2023)   Hunger Vital Sign    Worried About Running Out of Food in the Last Year: Never true  Ran Out of Food in the Last Year: Never true  Transportation Needs: No Transportation Needs (07/01/2023)   PRAPARE - Administrator, Civil Service (Medical): No    Lack of Transportation (Non-Medical): No  Physical Activity: Inactive (07/01/2023)   Exercise Vital Sign    Days of Exercise per Week: 0 days    Minutes of Exercise per Session: 0 min  Stress: Stress Concern Present (07/01/2023)   [employer] of Occupational Health - Occupational Stress Questionnaire    Feeling of Stress : To some extent  Social Connections: Moderately Integrated (07/01/2023)   Social Connection and Isolation Panel [NHANES]    Frequency of Communication with Friends and Family: Twice a week    Frequency of Social Gatherings with Friends  and Family: Once a week    Attends Religious Services: 1 to 4 times per year    Active Member of Clubs or Organizations: No    Attends Engineer, structural: Never    Marital Status: Living with partner    Allergies: No Known Allergies  Metabolic Disorder Labs: Lab Results  Component Value Date   HGBA1C 12.2 (H) 06/06/2023   MPG 174.29 04/10/2022   MPG 240.3 08/25/2021   No results found for: "PROLACTIN" Lab Results  Component Value Date   CHOL 144 06/06/2023   TRIG 171 (H) 06/06/2023   HDL 27 (L) 06/06/2023   CHOLHDL 5.3 (H) 06/06/2023   VLDL 11 02/05/2022   LDLCALC 87 06/06/2023   LDLCALC 74 10/17/2022   Lab Results  Component Value Date   TSH 0.924 10/17/2022   TSH 1.564 08/25/2021    Therapeutic Level Labs: No results found for: "LITHIUM" No results found for: "VALPROATE" No results found for: "CBMZ"  Current Medications: Current Outpatient Medications  Medication Sig Dispense Refill   acetaminophen (TYLENOL) 500 MG tablet Take 1,000 mg by mouth every 6 (six) hours as needed (pain).     aspirin EC 81 MG tablet Take 1 tablet (81 mg total) by mouth daily. (Patient not taking: Reported on 07/01/2023) 90 tablet 3   blood glucose meter kit and supplies Dispense based on patient and insurance preference. Once daily testing dx e11.9 1 each 0   Blood Pressure KIT Use to check blood pressure daily. 1 kit 0   buPROPion (WELLBUTRIN XL) 150 MG 24 hr tablet Take 3 tablets (450 mg total) by mouth daily. 270 tablet 1   empagliflozin (JARDIANCE) 25 MG TABS tablet Take 1 tablet (25 mg total) by mouth daily before breakfast. 90 tablet 3   fluconazole (DIFLUCAN) 150 MG tablet Take one tablet once then repeat in 3 days (for 3 doses) then weekly as needed 10 tablet 1   fluticasone (FLONASE) 50 MCG/ACT nasal spray Place 2 sprays into both nostrils daily. (Patient not taking: Reported on 07/01/2023) 16 g 6   glipiZIDE (GLUCOTROL XL) 10 MG 24 hr tablet Take 2 tablets (20 mg total)  by mouth in the morning with breakfast. 180 tablet 1   megestrol (MEGACE) 40 MG tablet Take 1 tablet (40 mg total) by mouth every morning. 90 tablet 4   metoprolol tartrate (LOPRESSOR) 25 MG tablet Take 1 tablet (25 mg total) by mouth 2 (two) times daily. Please schedule appointment for future refills. Thank you 180 tablet 1   montelukast (SINGULAIR) 10 MG tablet Take 1 tablet (10 mg total) by mouth at bedtime. 30 tablet 3   olmesartan (BENICAR) 20 MG tablet Take 1 tablet (20 mg total) by mouth  daily. 90 tablet 1   ondansetron (ZOFRAN) 4 MG tablet Take one tablet by mouth once daily as needed, for nausea 36 tablet 1   rosuvastatin (CRESTOR) 10 MG tablet Take 1 tablet (10 mg total) by mouth daily. 90 tablet 1   tirzepatide (MOUNJARO) 10 MG/0.5ML Pen Inject 10 mg into the skin once a week. 2 mL 0   triamterene-hydrochlorothiazide (MAXZIDE-25) 37.5-25 MG tablet Take 1 tablet by mouth daily. 90 tablet 3   venlafaxine XR (EFFEXOR-XR) 150 MG 24 hr capsule Take 1 capsule (150 mg total) by mouth daily. 90 capsule 1   venlafaxine XR (EFFEXOR-XR) 75 MG 24 hr capsule Take 1 capsule (75 mg total) by mouth daily. Total of 225 mg daily. Take along with 150 mg cap 90 capsule 1   Vitamin D, Ergocalciferol, (DRISDOL) 1.25 MG (50000 UNIT) CAPS capsule Take 1 capsule (50,000 Units total) by mouth every 7 (seven) days. 12 capsule 1   zaleplon (SONATA) 10 MG capsule Take 1 capsule (10 mg total) by mouth at bedtime as needed for sleep. 30 capsule 0   zaleplon (SONATA) 10 MG capsule Take 1 capsule (10 mg total) by mouth at bedtime as needed for sleep. 30 capsule 0   No current facility-administered medications for this visit.     Musculoskeletal: Strength & Muscle Tone: within normal limits Gait & Station: normal Patient leans: N/A  Psychiatric Specialty Exam: Review of Systems  Last menstrual period 01/18/2019.There is no height or weight on file to calculate BMI.  General Appearance: {Appearance:22683}  Eye  Contact:  {BHH EYE CONTACT:22684}  Speech:  Clear and Coherent  Volume:  Normal  Mood:  {BHH MOOD:22306}  Affect:  {Affect (PAA):22687}  Thought Process:  Coherent  Orientation:  Full (Time, Place, and Person)  Thought Content: Logical   Suicidal Thoughts:  {ST/HT (PAA):22692}  Homicidal Thoughts:  {ST/HT (PAA):22692}  Memory:  Immediate;   Good  Judgement:  {Judgement (PAA):22694}  Insight:  {Insight (PAA):22695}  Psychomotor Activity:  Normal  Concentration:  Concentration: Good and Attention Span: Good  Recall:  Good  Fund of Knowledge: Good  Language: Good  Akathisia:  No  Handed:  Right  AIMS (if indicated): not done  Assets:  Communication Skills Desire for Improvement  ADL's:  Intact  Cognition: WNL  Sleep:  {BHH GOOD/FAIR/POOR:22877}   Screenings: GAD-7    Flowsheet Row Office Visit from 07/01/2023 in [facility] Office Visit from 06/06/2023 in [facility] Office Visit from 02/06/2023 in [facility] Office Visit from 10/17/2022 in [facility] Office Visit from 07/10/2022 in [facility]  Total GAD-7 Score 4 9 6 4 7       PHQ2-9    Flowsheet Row Office Visit from 07/01/2023 in [facility] Office Visit from 06/06/2023 in [facility] Office Visit from 02/06/2023 in [facility] Office Visit from 10/17/2022 in [facility] Video Visit from 07/24/2022 in [facility]  PHQ-2 Total Score 2 0 2 2 0  PHQ-9 Total Score 6 -- 9 8 0      Flowsheet Row Video Visit from 09/14/2021 in [facility] Video Visit from 08/10/2021 in [facility] Video Visit from 05/04/2021 in [facility]  C-SSRS RISK  CATEGORY Error: Q3,  4, or 5 should not be populated when Q2 is No No Risk Low Risk        Assessment and Plan:  Mary Roman is a 53 y.o. year old female with a history of depression, mild OSA, diabetes, who presents for follow up appointment for below.    1. MDD (major depressive disorder), recurrent episode, mild (HCC) 2. GAD (generalized anxiety disorder) Acute stressors include: her mother with cancer, proceeding with biopsy, her fiancee with pending knee replacement due to diabetes, work related stress due to shortage of staff ,conflict with one of her sister  Other stressors include: financial strain, difficulty in communication with her daughter, conflict with her siblings   History:   There has been overall improvement in her mood symptoms since the last visit.  Will continue venlafaxine, bupropion, rexulti to target depression.  She is willing to wait contact with Ms. Bynum; she was advised to contact the office to see if she can have a follow-up appointment.    3. Insomnia, unspecified type Unstable.  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  Will order referral for an evaluation of sleep apnea.  Will continue Sonata as needed for insomnia.  It is discussed with the patient to consider tapering off in the future as there is limited data for long-term effect, and to mitigate any side effects.     Plan (she will contact the office if she needs a refill) Continue venlafaxine 225 mg daily  Continue bupropion 450 mg daily  Continue rexulti 1 mg daily  Continue Sonata 10 mg at night as needed for insomnia- [facility] Referral for re-evaluation of sleep apnea Next appointment: 11/25 at 10 am for 30 mins, IP - on Mounjaro     Past trials of medication: fluoxetine, duloxetine, Abilify (weight gain),  buspirone, Trazodone ("weird" feeling) temazepam, Ambien, Lunesta, Xanax      The patient demonstrates the following risk factors for suicide: Chronic  risk factors for suicide include: psychiatric disorder of depression, anxiety and chronic pain. Acute risk factors for suicide include: N/A. Protective factors for this patient include: positive social support, responsibility to others (children, family), coping skills and hope for the future. Considering these factors, the overall suicide risk at this point appears to be low. Patient is appropriate for outpatient follow up.    Collaboration of Care: Collaboration of Care: {BH OP Collaboration of Care:21014065}  Patient/Guardian was advised Release of Information must be obtained prior to any record release in order to collaborate their care with an outside provider. Patient/Guardian was advised if they have not already done so to contact the registration department to sign all necessary forms in order for [place] to release information regarding their care.   Consent: Patient/Guardian gives verbal consent for treatment and assignment of benefits for services provided during this visit. Patient/Guardian expressed understanding and agreed to proceed.    Neysa Hotter, MD 08/27/2023, 3:18 PM

## 2023-08-29 ENCOUNTER — Other Ambulatory Visit: Payer: Self-pay | Admitting: Psychiatry

## 2023-08-29 ENCOUNTER — Telehealth: Payer: Self-pay | Admitting: Psychiatry

## 2023-08-29 MED ORDER — ZALEPLON 10 MG PO CAPS: 10.0000 mg | ORAL_CAPSULE | Freq: Every evening | ORAL | 1 refills | Status: DC | PRN

## 2023-08-29 NOTE — Telephone Encounter (Signed)
The order is set to be picked up on or after 2/5. Please advise her to ensure she keeps her appointment, as I have not seen her since September and will not be able to provide any further refills without seeing her.

## 2023-09-02 ENCOUNTER — Ambulatory Visit: Payer: 59 | Admitting: Psychiatry

## 2023-09-02 NOTE — Telephone Encounter (Signed)
pt was notified that rx should be on hold at the pharmacy so when she call to get it filled she needs to speak with a pharmacist and let them know that it is on hold.

## 2023-09-03 ENCOUNTER — Other Ambulatory Visit: Payer: Self-pay | Admitting: Family Medicine

## 2023-09-16 ENCOUNTER — Ambulatory Visit: Payer: 59 | Admitting: Nutrition

## 2023-09-27 ENCOUNTER — Ambulatory Visit: Payer: 59 | Admitting: Family Medicine

## 2023-09-27 ENCOUNTER — Encounter: Payer: Self-pay | Admitting: Family Medicine

## 2023-10-25 NOTE — Progress Notes (Unsigned)
 No show

## 2023-10-28 ENCOUNTER — Ambulatory Visit (INDEPENDENT_AMBULATORY_CARE_PROVIDER_SITE_OTHER): Payer: Self-pay | Admitting: Psychiatry

## 2023-10-28 DIAGNOSIS — Z91199 Patient's noncompliance with other medical treatment and regimen due to unspecified reason: Secondary | ICD-10-CM

## 2023-11-05 ENCOUNTER — Encounter: Payer: Self-pay | Admitting: Family Medicine

## 2023-11-05 ENCOUNTER — Other Ambulatory Visit: Payer: Self-pay

## 2023-11-05 ENCOUNTER — Other Ambulatory Visit (HOSPITAL_COMMUNITY): Payer: Self-pay

## 2023-11-05 ENCOUNTER — Ambulatory Visit: Payer: Commercial Managed Care - PPO | Admitting: Family Medicine

## 2023-11-05 VITALS — BP 108/68 | HR 70 | Resp 18 | Ht 65.0 in | Wt 188.1 lb

## 2023-11-05 DIAGNOSIS — F411 Generalized anxiety disorder: Secondary | ICD-10-CM | POA: Diagnosis not present

## 2023-11-05 DIAGNOSIS — I10 Essential (primary) hypertension: Secondary | ICD-10-CM | POA: Diagnosis not present

## 2023-11-05 DIAGNOSIS — F331 Major depressive disorder, recurrent, moderate: Secondary | ICD-10-CM

## 2023-11-05 DIAGNOSIS — Z1231 Encounter for screening mammogram for malignant neoplasm of breast: Secondary | ICD-10-CM

## 2023-11-05 DIAGNOSIS — F4321 Adjustment disorder with depressed mood: Secondary | ICD-10-CM | POA: Diagnosis not present

## 2023-11-05 DIAGNOSIS — E1159 Type 2 diabetes mellitus with other circulatory complications: Secondary | ICD-10-CM | POA: Diagnosis not present

## 2023-11-05 DIAGNOSIS — Z7985 Long-term (current) use of injectable non-insulin antidiabetic drugs: Secondary | ICD-10-CM

## 2023-11-05 DIAGNOSIS — E785 Hyperlipidemia, unspecified: Secondary | ICD-10-CM | POA: Diagnosis not present

## 2023-11-05 DIAGNOSIS — Z1329 Encounter for screening for other suspected endocrine disorder: Secondary | ICD-10-CM | POA: Diagnosis not present

## 2023-11-05 MED ORDER — TIRZEPATIDE 7.5 MG/0.5ML ~~LOC~~ SOAJ
7.5000 mg | SUBCUTANEOUS | 1 refills | Status: DC
  Filled 2023-11-05: qty 2, 28d supply, fill #0
  Filled 2023-12-17: qty 2, 28d supply, fill #1
  Filled 2024-01-22: qty 2, 28d supply, fill #2
  Filled 2024-02-18: qty 2, 28d supply, fill #3
  Filled 2024-03-13: qty 2, 28d supply, fill #4

## 2023-11-05 NOTE — Assessment & Plan Note (Signed)
 Loss of Mother less than  2 months ago, appropriate grieving occurring encouraged pt to reach out to Hospice support team as available for 12 months, also encouraged group therapy and provided 2 local resources  Encouraged return to spirituality and [employer]

## 2023-11-05 NOTE — Progress Notes (Signed)
 Mary Roman     MRN: [id number]      DOB: 12-05-1970  Chief Complaint  Patient presents with   Diabetes    Follow up     HPI Mary Roman is here for follow up and re-evaluation of chronic medical conditions, medication management and review of any available recent lab and radiology data.  Preventive health is updated, specifically  Cancer screening and Immunization.   Questions or concerns regarding consultations or procedures which the PT has had in the interim are  addressed. The PT denies any adverse reactions to current medications since the last visit.  Not testing blood sugar and overeating sweets at home Grieving over death of Mom about 6 weeks ago from dementia , not the colon cancer she had battled for years  Depression and anxiey are uncontrolled, needs to make and keep appt with Psych  ROS Denies recent fever or chills. Denies sinus pressure, nasal congestion, ear pain or sore throat. Denies chest congestion, productive cough or wheezing. Denies chest pains, palpitations and leg swelling Denies abdominal pain, nausea, vomiting,diarrhea or constipation.   Denies dysuria, frequency, hesitancy or incontinence. Denies joint pain, swelling and limitation in mobility. Denies headaches, seizures, numbness, or tingling.  Denies skin break down or rash.   PE  BP 108/68   Pulse 70   Resp 18   Ht 5\' 5"  (1.651 m)   Wt 188 lb 1.3 oz (85.3 kg)   LMP 01/18/2019   SpO2 95%   BMI 31.30 kg/m   Patient alert and oriented and in no cardiopulmonary distress.  HEENT: No facial asymmetry, EOMI,     Neck supple .  Chest: Clear to auscultation bilaterally.  CVS: S1, S2 no murmurs, no S3.Regular rate.  ABD: Soft non tender.   Ext: No edema  MS: Adequate ROM spine, shoulders, hips and knees.  Skin: Intact, no ulcerations or rash noted.  Psych: Good eye contact, normal affect. Memory intact not anxious or depressed appearing.  CNS: CN 2-12 intact, power,   normal throughout.no focal deficits noted.   Assessment & Plan  Essential hypertension Controlled, no change in medication DASH diet and commitment to daily physical activity for a minimum of 30 minutes discussed and encouraged, as a part of hypertension management. The importance of attaining a healthy weight is also discussed.     11/05/2023    3:01 PM 07/01/2023    1:50 PM 06/06/2023    9:00 AM 02/06/2023    3:12 PM 02/06/2023    3:11 PM 02/06/2023    2:48 PM 02/06/2023    2:46 PM  BP/Weight  Systolic BP 108 158 122 140 142 152 169  Diastolic BP 68 83 75 94 92 89 84  Wt. (Lbs) 188.08 198.4 197    202.04  BMI 31.3 kg/m2 33.02 kg/m2 32.78 kg/m2    33.62 kg/m2       Type 2 diabetes mellitus with vascular disease (HCC) Diabetes associated with hypertension, hyperlipidemia, and depression Uncontrolled updated lab today, unable to toplerate mounjaro 10 mg dose , dose reduced to 7.5 mg Mary Roman is reminded of the importance of commitment to daily physical activity for 30 minutes or more, as able and the need to limit carbohydrate intake to 30 to 60 grams per meal to help with blood sugar control.   The need to take medication as prescribed, test blood sugar as directed, and to call between visits if there is a concern that blood sugar is uncontrolled is also  discussed.   Mary Roman is reminded of the importance of daily foot exam, annual eye examination, and good blood sugar, blood pressure and cholesterol control.     Latest Ref Rng & Units 06/06/2023    9:23 AM 06/06/2023    9:00 AM 02/06/2023    3:27 PM 10/17/2022    4:17 PM 07/10/2022    3:28 PM  Diabetic Labs  HbA1c 4.8 - 5.6 % 12.2   12.4  9.4  8.3   Micro/Creat Ratio 0 - 29 mg/g creat  <13      Chol 100 - 199 mg/dL  962   952    HDL >84 mg/dL  27   32    Calc LDL 0 - 99 mg/dL  87   74    Triglycerides 0 - 149 mg/dL  132   77    Creatinine 0.57 - 1.00 mg/dL  4.40  1.02  7.25  3.66       11/05/2023    3:01 PM  07/01/2023    1:50 PM 06/06/2023    9:00 AM 02/06/2023    3:12 PM 02/06/2023    3:11 PM 02/06/2023    2:48 PM 02/06/2023    2:46 PM  BP/Weight  Systolic BP 108 158 122 140 142 152 169  Diastolic BP 68 83 75 94 92 89 84  Wt. (Lbs) 188.08 198.4 197    202.04  BMI 31.3 kg/m2 33.02 kg/m2 32.78 kg/m2    33.62 kg/m2      Latest Ref Rng & Units 06/06/2023    9:00 AM 10/15/2022   11:23 AM  Foot/eye exam completion dates  Eye Exam No Retinopathy  Retinopathy      Foot Form Completion  Done      This result is from an external source.        Dyslipidemia (high LDL; low HDL) Hyperlipidemia:Low fat diet discussed and encouraged.   Lipid Panel  Lab Results  Component Value Date   CHOL 144 06/06/2023   HDL 27 (L) 06/06/2023   LDLCALC 87 06/06/2023   TRIG 171 (H) 06/06/2023   CHOLHDL 5.3 (H) 06/06/2023     Updated lab today Hyperlipidemia:Low fat diet discussed and encouraged.   Lipid Panel  Lab Results  Component Value Date   CHOL 144 06/06/2023   HDL 27 (L) 06/06/2023   LDLCALC 87 06/06/2023   TRIG 171 (H) 06/06/2023   CHOLHDL 5.3 (H) 06/06/2023     Updated lab needed at/ before next visit.   GAD (generalized anxiety disorder) Uncontrolled , message sent to Psych and pt to make appt  Major depressive disorder, recurrent episode, moderate (HCC) Uncontrolled , pt to make and keep Psych appt  Feeling grief Loss of Mother less than  2 months ago, appropriate grieving occurring encouraged pt to reach out to Hospice support team as available for 12 months, also encouraged group therapy and provided 2 local resources  Encouraged return to spirituality and [employer]

## 2023-11-05 NOTE — Patient Instructions (Addendum)
 F/U in 6 weeks with diabetic suga log  Goal for fasting blood sugar ranges from 80 to 120 and 2 hours after any meal or at bedtime should be between 130 to 170.   Lower dose mounjaro prescribed  Need to schedule appt with psych and follow through  Lipid, cmp and EGFr HBA1C and TSH today  It is important that you exercise regularly at least 30 minutes 5 times a week. If you develop chest pain, have severe difficulty breathing, or feel very tired, stop exercising immediately and seek medical attention   Thanks for choosing [facility], we consider it a privelige to serve you.

## 2023-11-05 NOTE — Assessment & Plan Note (Addendum)
 Hyperlipidemia:Low fat diet discussed and encouraged.   Lipid Panel  Lab Results  Component Value Date   CHOL 144 06/06/2023   HDL 27 (L) 06/06/2023   LDLCALC 87 06/06/2023   TRIG 171 (H) 06/06/2023   CHOLHDL 5.3 (H) 06/06/2023     Updated lab today Hyperlipidemia:Low fat diet discussed and encouraged.   Lipid Panel  Lab Results  Component Value Date   CHOL 144 06/06/2023   HDL 27 (L) 06/06/2023   LDLCALC 87 06/06/2023   TRIG 171 (H) 06/06/2023   CHOLHDL 5.3 (H) 06/06/2023     Updated lab needed at/ before next visit.

## 2023-11-05 NOTE — Assessment & Plan Note (Signed)
 Uncontrolled , message sent to Psych and pt to make appt

## 2023-11-05 NOTE — Assessment & Plan Note (Signed)
 Controlled, no change in medication DASH diet and commitment to daily physical activity for a minimum of 30 minutes discussed and encouraged, as a part of hypertension management. The importance of attaining a healthy weight is also discussed.     11/05/2023    3:01 PM 07/01/2023    1:50 PM 06/06/2023    9:00 AM 02/06/2023    3:12 PM 02/06/2023    3:11 PM 02/06/2023    2:48 PM 02/06/2023    2:46 PM  BP/Weight  Systolic BP 108 158 122 140 142 152 169  Diastolic BP 68 83 75 94 92 89 84  Wt. (Lbs) 188.08 198.4 197    202.04  BMI 31.3 kg/m2 33.02 kg/m2 32.78 kg/m2    33.62 kg/m2

## 2023-11-05 NOTE — Assessment & Plan Note (Addendum)
 Diabetes associated with hypertension, hyperlipidemia, and depression Uncontrolled updated lab today, unable to toplerate mounjaro 10 mg dose , dose reduced to 7.5 mg Mary Roman is reminded of the importance of commitment to daily physical activity for 30 minutes or more, as able and the need to limit carbohydrate intake to 30 to 60 grams per meal to help with blood sugar control.   The need to take medication as prescribed, test blood sugar as directed, and to call between visits if there is a concern that blood sugar is uncontrolled is also discussed.   Mary Roman is reminded of the importance of daily foot exam, annual eye examination, and good blood sugar, blood pressure and cholesterol control.     Latest Ref Rng & Units 06/06/2023    9:23 AM 06/06/2023    9:00 AM 02/06/2023    3:27 PM 10/17/2022    4:17 PM 07/10/2022    3:28 PM  Diabetic Labs  HbA1c 4.8 - 5.6 % 12.2   12.4  9.4  8.3   Micro/Creat Ratio 0 - 29 mg/g creat  <13      Chol 100 - 199 mg/dL  106   269    HDL >48 mg/dL  27   32    Calc LDL 0 - 99 mg/dL  87   74    Triglycerides 0 - 149 mg/dL  546   77    Creatinine 0.57 - 1.00 mg/dL  2.70  3.50  0.93  8.18       11/05/2023    3:01 PM 07/01/2023    1:50 PM 06/06/2023    9:00 AM 02/06/2023    3:12 PM 02/06/2023    3:11 PM 02/06/2023    2:48 PM 02/06/2023    2:46 PM  BP/Weight  Systolic BP 108 158 122 140 142 152 169  Diastolic BP 68 83 75 94 92 89 84  Wt. (Lbs) 188.08 198.4 197    202.04  BMI 31.3 kg/m2 33.02 kg/m2 32.78 kg/m2    33.62 kg/m2      Latest Ref Rng & Units 06/06/2023    9:00 AM 10/15/2022   11:23 AM  Foot/eye exam completion dates  Eye Exam No Retinopathy  Retinopathy      Foot Form Completion  Done      This result is from an external source.

## 2023-11-05 NOTE — Assessment & Plan Note (Signed)
 Uncontrolled , pt to make and keep Psych appt

## 2023-11-06 ENCOUNTER — Encounter: Payer: Self-pay | Admitting: Family Medicine

## 2023-11-06 LAB — CMP14+EGFR
ALT: 15 IU/L (ref 0–32)
AST: 14 IU/L (ref 0–40)
Albumin: 4.7 g/dL (ref 3.8–4.9)
Alkaline Phosphatase: 96 IU/L (ref 44–121)
BUN/Creatinine Ratio: 6 — ABNORMAL LOW (ref 9–23)
BUN: 6 mg/dL (ref 6–24)
Bilirubin Total: 0.5 mg/dL (ref 0.0–1.2)
CO2: 23 mmol/L (ref 20–29)
Calcium: 10.8 mg/dL — ABNORMAL HIGH (ref 8.7–10.2)
Chloride: 104 mmol/L (ref 96–106)
Creatinine, Ser: 1.06 mg/dL — ABNORMAL HIGH (ref 0.57–1.00)
Globulin, Total: 3 g/dL (ref 1.5–4.5)
Glucose: 128 mg/dL — ABNORMAL HIGH (ref 70–99)
Potassium: 4 mmol/L (ref 3.5–5.2)
Sodium: 142 mmol/L (ref 134–144)
Total Protein: 7.7 g/dL (ref 6.0–8.5)
eGFR: 63 mL/min/{1.73_m2} (ref >59)

## 2023-11-06 LAB — LIPID PANEL W/O CHOL/HDL RATIO
Cholesterol, Total: 137 mg/dL (ref 100–199)
HDL: 36 mg/dL — ABNORMAL LOW (ref >39)
LDL Chol Calc (NIH): 83 mg/dL (ref 0–99)
Triglycerides: 93 mg/dL (ref 0–149)
VLDL Cholesterol Cal: 18 mg/dL (ref 5–40)

## 2023-11-06 LAB — TSH+FREE T4
Free T4: 1.03 ng/dL (ref 0.82–1.77)
TSH: 2.14 u[IU]/mL (ref 0.450–4.500)

## 2023-11-06 LAB — HEMOGLOBIN A1C
Est. average glucose Bld gHb Est-mCnc: 315 mg/dL
Hgb A1c MFr Bld: 12.6 % — ABNORMAL HIGH (ref 4.8–5.6)

## 2023-11-06 NOTE — Progress Notes (Unsigned)
 Virtual Visit via Video Note  I connected with Mary Roman on 11/08/23 at 10:00 AM EDT by a video enabled telemedicine application and verified that I am speaking with the correct person using two identifiers.  Location: Patient: work Provider: office Persons participated in the visit- patient, provider    I discussed the limitations of evaluation and management by telemedicine and the availability of in person appointments. The patient expressed understanding and agreed to proceed.     I discussed the assessment and treatment plan with the patient. The patient was provided an opportunity to ask questions and all were answered. The patient agreed with the plan and demonstrated an understanding of the instructions.   The patient was advised to call back or seek an in-person evaluation if the symptoms worsen or if the condition fails to improve as anticipated.   Neysa Hotter, MD    [facility] MD/PA/NP OP Progress Note  11/08/2023 12:22 PM Mary Roman  MRN:  [id number]  Chief Complaint:  Chief Complaint  Patient presents with   Follow-up   HPI:  This is a follow-up appointment for depression, anxiety and insomnia.  She is not seen since last September.  She states that her mother passed away in 2023-09-15 of this year. Her mother was in hospice care since last December.  She states that dementia took over her.  Although there was some movement she was able to recognize who they are, but she was unable to on other occasions.  The boss and her coworkers have been understanding.  It has been just too hard on some days. She is in a funk.  Her anxiety is up, and she struggles with insomnia.  She sleeps only 1 hour or so.  She is not eating as much and has found out she lost the weight.  She especially struggles with insomnia, although her body feels tired.  She tends to think about everything, mostly about her mother.  She has difficulty in concentration.  She denies SI.  She states  that she has been taking medication consistently, 90 % of the time, including rexulti. According to the chart review, rexulti would have lasted until last year.   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  one son, one 43 yo daughter   Visit Diagnosis:    ICD-10-CM   1. GAD (generalized anxiety disorder)  F41.1     2. MDD (major depressive disorder), recurrent episode, moderate (HCC)  F33.1     3. Insomnia, unspecified type  G47.00       Past Psychiatric History: Please see initial evaluation for full details. I have reviewed the history. No updates at this time.     Past Medical History:  Past Medical History:  Diagnosis Date   Absolute anemia 06/23/2018   Added automatically from request for surgery [id number]   Anemia    Anxiety    Backache 12/24/2007   Chronic back pain    Depression    Diabetes mellitus, type II (HCC)    Headache(784.0)    Hypertension    Metabolic syndrome X 04/12/2014   Obesity    Palpitation    Sickle cell trait (HCC)    Thyromegaly    Unspecified vitamin  D deficiency 02/19/2013    Past Surgical History:  Procedure Laterality Date   COLONOSCOPY WITH PROPOFOL N/A 01/03/2021   Procedure: COLONOSCOPY WITH PROPOFOL;  Surgeon: Dolores Frame, MD;  Location: [facility];  Service: Gastroenterology;  Laterality: N/A;  AM   DILATATION AND CURETTAGE/HYSTEROSCOPY WITH MINERVA N/A 03/25/2019   Procedure: DILATATION AND CURETTAGE/HYSTEROSCOPY WITH  ATTEMPTED MINERVA ENDOMETRIAL ABLATION;  Surgeon: Lazaro Arms, MD;  Location: [facility] ORS;  Service: Gynecology;  Laterality: N/A;   None      Family Psychiatric History: Please see initial evaluation for full details. I have reviewed the history. No  updates at this time.     Family History:  Family History  Problem Relation Age of Onset   Diabetes Mother    Hypertension Mother    Stroke Mother    Colon cancer Mother    Alcohol abuse Mother    Cancer - Colon Mother 72   Pneumonia Father    Depression Sister    Hypertension Sister    Anxiety disorder Sister    Hypertension Sister    Leukemia Sister    Heart attack Maternal Uncle    Hypertension Maternal Grandfather    Diabetes Maternal Grandmother    Hypertension Maternal Grandmother    Depression Child     Social History:  Social History   Socioeconomic History   Marital status: Single    Spouse name: Not on file   Number of children: 2   Years of education: Not on file   Highest education level: Not on file  Occupational History   Occupation: employed in medical office   Tobacco Use   Smoking status: Never   Smokeless tobacco: Never  Vaping Use   Vaping status: Never Used  Substance and Sexual Activity   Alcohol use: No   Drug use: No   Sexual activity: Yes    Partners: Male    Birth control/protection: Other-see comments    Comment: bf had vasectomy  Other Topics Concern   Not on file  Social History Narrative   Not on file   Social Drivers of Health   Financial Resource Strain: Low Risk  (07/01/2023)   Overall Financial Resource Strain (CARDIA)    Difficulty of Paying Living Expenses: Not very hard  Food Insecurity: No Food Insecurity (07/01/2023)   Hunger Vital Sign    Worried About Running Out of Food in the Last Year: Never true    Ran Out of Food in the Last Year: Never true  Transportation Needs: No Transportation Needs (07/01/2023)   PRAPARE - Administrator, Civil Service (Medical): No    Lack of Transportation (Non-Medical): No  Physical Activity: Inactive (07/01/2023)   Exercise Vital Sign    Days of Exercise per Week: 0 days    Minutes of Exercise per Session: 0 min  Stress: Stress Concern Present (07/01/2023)   [employer] of Occupational Health - Occupational Stress Questionnaire    Feeling of Stress : To some extent  Social Connections: Moderately Integrated (07/01/2023)   Social Connection and Isolation Panel [NHANES]    Frequency of Communication with Friends and Family: Twice a week    Frequency of Social Gatherings with Friends and Family: Once a week    Attends Religious Services: 1 to 4 times per year    Active Member of [employer] or Organizations: No    Attends Banker Meetings: Never    Marital Status: Living with partner  Allergies: No Known Allergies  Metabolic Disorder Labs: Lab Results  Component Value Date   HGBA1C 12.6 (H) 11/05/2023   MPG 174.29 04/10/2022   MPG 240.3 08/25/2021   No results found for: "PROLACTIN" Lab Results  Component Value Date   CHOL 137 11/05/2023   TRIG 93 11/05/2023   HDL 36 (L) 11/05/2023   CHOLHDL 5.3 (H) 06/06/2023   VLDL 11 02/05/2022   LDLCALC 83 11/05/2023   LDLCALC 87 06/06/2023   Lab Results  Component Value Date   TSH 2.140 11/05/2023   TSH 0.924 10/17/2022    Therapeutic Level Labs: No results found for: "LITHIUM" No results found for: "VALPROATE" No results found for: "CBMZ"  Current Medications: Current Outpatient Medications  Medication Sig Dispense Refill   hydrOXYzine (ATARAX) 25 MG tablet Take 1 tablet (25 mg total) by mouth daily as needed for anxiety. 30 tablet 1   acetaminophen (TYLENOL) 500 MG tablet Take 1,000 mg by mouth every 6 (six) hours as needed (pain).     aspirin EC 81 MG tablet Take 1 tablet (81 mg total) by mouth daily. 90 tablet 3   blood glucose meter kit and supplies Dispense based on patient and insurance preference. Once daily testing dx e11.9 1 each 0   Blood Pressure KIT Use to check blood pressure daily. 1 kit 0   buPROPion (WELLBUTRIN XL) 150 MG 24 hr tablet Take 3 tablets (450 mg total) by mouth daily. 270 tablet 1   empagliflozin (JARDIANCE) 25 MG TABS tablet Take 1 tablet (25 mg  total) by mouth daily before breakfast. 90 tablet 3   fluticasone (FLONASE) 50 MCG/ACT nasal spray Place 2 sprays into both nostrils daily. 16 g 6   glipiZIDE (GLUCOTROL XL) 10 MG 24 hr tablet Take 2 tablets (20 mg total) by mouth in the morning with breakfast. 180 tablet 1   metoprolol tartrate (LOPRESSOR) 25 MG tablet Take 1 tablet (25 mg total) by mouth 2 (two) times daily. Please schedule appointment for future refills. Thank you 180 tablet 1   montelukast (SINGULAIR) 10 MG tablet Take 1 tablet (10 mg total) by mouth at bedtime. 30 tablet 3   olmesartan (BENICAR) 20 MG tablet Take 1 tablet (20 mg total) by mouth daily. 90 tablet 1   ondansetron (ZOFRAN) 4 MG tablet Take one tablet by mouth once daily as needed, for nausea 36 tablet 1   rosuvastatin (CRESTOR) 10 MG tablet Take 1 tablet (10 mg total) by mouth daily. 90 tablet 1   tirzepatide (MOUNJARO) 7.5 MG/0.5ML Pen Inject 7.5 mg into the skin once a week. 6 mL 1   triamterene-hydrochlorothiazide (MAXZIDE-25) 37.5-25 MG tablet Take 1 tablet by mouth daily. 90 tablet 3   venlafaxine XR (EFFEXOR-XR) 150 MG 24 hr capsule Take 1 capsule (150 mg total) by mouth daily. 90 capsule 1   venlafaxine XR (EFFEXOR-XR) 75 MG 24 hr capsule Take 1 capsule (75 mg total) by mouth daily. Total of 225 mg daily. Take along with 150 mg cap 90 capsule 1   Vitamin D, Ergocalciferol, (DRISDOL) 1.25 MG (50000 UNIT) CAPS capsule Take 1 capsule (50,000 Units total) by mouth every 7 (seven) days. 12 capsule 1   zaleplon (SONATA) 10 MG capsule Take 1 capsule (10 mg total) by mouth at bedtime as needed for sleep. 30 capsule 0   zaleplon (SONATA) 10 MG capsule Take 1 capsule (10 mg total) by mouth at bedtime as needed for sleep. 30 capsule 1   No current facility-administered medications  for this visit.     Musculoskeletal: Strength & Muscle Tone:  N/A Gait & Station:  N/A Patient leans: N/A  Psychiatric Specialty Exam: Review of Systems  Psychiatric/Behavioral:   Positive for dysphoric mood and sleep disturbance. Negative for agitation, behavioral problems, confusion, decreased concentration, hallucinations, self-injury and suicidal ideas. The patient is nervous/anxious. The patient is not hyperactive.   All other systems reviewed and are negative.   Last menstrual period 01/18/2019.There is no height or weight on file to calculate BMI.  General Appearance: Well Groomed  Eye Contact:  Good  Speech:  Clear and Coherent  Volume:  Normal  Mood:  Anxious and Depressed  Affect:  Appropriate, Congruent, and down  Thought Process:  Coherent  Orientation:  Full (Time, Place, and Person)  Thought Content: Logical   Suicidal Thoughts:  No  Homicidal Thoughts:  No  Memory:  Immediate;   Good  Judgement:  Good  Insight:  Good  Psychomotor Activity:  Normal  Concentration:  Concentration: Good and Attention Span: Good  Recall:  Good  Fund of Knowledge: Good  Language: Good  Akathisia:  No  Handed:  Right  AIMS (if indicated): not done  Assets:  Communication Skills Desire for Improvement  ADL's:  Intact  Cognition: WNL  Sleep:  Poor   Screenings: GAD-7    Flowsheet Row Office Visit from 11/05/2023 in [facility] Office Visit from 07/01/2023 in [facility] Office Visit from 06/06/2023 in [facility] Office Visit from 02/06/2023 in [facility] Office Visit from 10/17/2022 in [facility]  Total GAD-7 Score 15 4 9 6 4       PHQ2-9    Flowsheet Row Office Visit from 11/05/2023 in [facility] Office Visit from 07/01/2023 in [facility] Office Visit from 06/06/2023 in [facility] Office Visit from 02/06/2023 in [facility] Office Visit from 10/17/2022 in [facility]  PHQ-2 Total Score 4 2  0 2 2  PHQ-9 Total Score 15 6 -- 9 8      Flowsheet Row Video Visit from 09/14/2021 in [facility] Video Visit from 08/10/2021 in [facility] Video Visit from 05/04/2021 in [facility]  C-SSRS RISK CATEGORY Error: Q3, 4, or 5 should not be populated when Q2 is No No Risk Low Risk        Assessment and Plan:  Mary Roman is a 53 y.o. year old female with a history of depression, mild OSA, diabetes, who presents for follow up appointment for below.   1. MDD (major depressive disorder), recurrent episode, moderate (HCC) 2. GAD (generalized anxiety disorder) Acute stressors include: loss of her mother in Feb 2025, her fiancee with pending knee replacement due to diabetes, work related stress due to shortage of staff ,conflict with one of her sister  Other stressors include: financial strain, difficulty in communication with her daughter, conflict with her siblings   History:   There has been significant worsening in depressive symptoms and anxiety in the context of loss of her mother. Although she reports adherence to her medication, there is concern regarding this based on the prescription data documented in the chart.  She agrees to stay on the current medication regimen, and ensure adherence moving forward  to assess its effectiveness.  Will continue venlafaxine, bupropion, and rexulti to target depression.  Will start hydroxyzine as needed for anxiety.  Discussed potential risk of drowsiness.   3. Insomnia, unspecified type Significantly worsened since she ran out of [place].  Will stay on the current dose to target insomnia.  She was advised to contact her sleep specialist for further evaluation.     Plan (she will contact the office if she needs a refill) Continue venlafaxine 225 mg daily  Continue bupropion 450 mg daily  Continue rexulti 1 mg daily  Continue  Sonata 10 mg at night as needed for insomnia- [facility] Start hydroxyzine 25 mg daily as needed for anxiety- [facility] Referred for re-evaluation of sleep apnea Next appointment: 5/23 at 8 am, video - on Mounjaro     Past trials of medication: fluoxetine, duloxetine, Abilify (weight gain),  buspirone, Trazodone ("weird" feeling) temazepam, Ambien, Lunesta, Xanax      The patient demonstrates the following risk factors for suicide: Chronic risk factors for suicide include: psychiatric disorder of depression, anxiety and chronic pain. Acute risk factors for suicide include: N/A. Protective factors for this patient include: positive social support, responsibility to others (children, family), coping skills and hope for the future. Considering these factors, the overall suicide risk at this point appears to be low. Patient is appropriate for outpatient follow up.    Collaboration of Care: Collaboration of Care: Other reviewed notes in Epic  Patient/Guardian was advised Release of Information must be obtained prior to any record release in order to collaborate their care with an outside provider. Patient/Guardian was advised if they have not already done so to contact the registration department to sign all necessary forms in order for [place] to release information regarding their care.   Consent: Patient/Guardian gives verbal consent for treatment and assignment of benefits for services provided during this visit. Patient/Guardian expressed understanding and agreed to proceed.    Neysa Hotter, MD 11/08/2023, 12:22 PM

## 2023-11-08 ENCOUNTER — Encounter: Payer: Self-pay | Admitting: Psychiatry

## 2023-11-08 ENCOUNTER — Telehealth (INDEPENDENT_AMBULATORY_CARE_PROVIDER_SITE_OTHER): Admitting: Psychiatry

## 2023-11-08 DIAGNOSIS — F331 Major depressive disorder, recurrent, moderate: Secondary | ICD-10-CM | POA: Diagnosis not present

## 2023-11-08 DIAGNOSIS — G4700 Insomnia, unspecified: Secondary | ICD-10-CM

## 2023-11-08 DIAGNOSIS — F411 Generalized anxiety disorder: Secondary | ICD-10-CM

## 2023-11-08 MED ORDER — ZALEPLON 10 MG PO CAPS: 10.0000 mg | ORAL_CAPSULE | Freq: Every evening | ORAL | 1 refills | Status: DC | PRN

## 2023-11-08 MED ORDER — HYDROXYZINE HCL 25 MG PO TABS: 25.0000 mg | ORAL_TABLET | Freq: Every day | ORAL | 1 refills | Status: AC | PRN

## 2023-11-12 ENCOUNTER — Other Ambulatory Visit: Payer: Self-pay | Admitting: Family Medicine

## 2023-11-12 ENCOUNTER — Telehealth: Payer: Self-pay | Admitting: Family Medicine

## 2023-11-12 ENCOUNTER — Other Ambulatory Visit: Payer: Self-pay | Admitting: Internal Medicine

## 2023-11-12 ENCOUNTER — Encounter: Payer: Self-pay | Admitting: Family Medicine

## 2023-11-12 MED ORDER — ONDANSETRON HCL 4 MG PO TABS
ORAL_TABLET | ORAL | 1 refills | Status: AC
Start: 2023-11-12 — End: ?

## 2023-11-12 NOTE — Telephone Encounter (Signed)
 Copied from CRM [id number]. Topic: Clinical - Medication Refill >> Nov 12, 2023  8:33 AM [employer] wrote: Most Recent Primary Care Visit:  Provider: Towanda Fret  Department: [facility]  Visit Type: OFFICE VISIT  Date: 11/05/2023  Medication: ondansetron (ZOFRAN) 4 MG tablet   Has the patient contacted their pharmacy? Yes (Agent: If no, request that the patient contact the pharmacy for the refill. If patient does not wish to contact the pharmacy document the reason why and proceed with request.) (Agent: If yes, when and what did the pharmacy advise?)  Is this the correct pharmacy for this prescription? Yes If no, delete pharmacy and type the correct one.  This is the patient's preferred pharmacy:  [facility] [address], [place] - [address] [place] [address] [place] [address] Phone: [phone] Fax: [phone]  Has the prescription been filled recently? Yes  Is the patient out of the medication? Yes  Has the patient been seen for an appointment in the last year OR does the patient have an upcoming appointment? Yes  Can we respond through MyChart? Yes  Agent: Please be advised that Rx refills may take up to 3 business days. We ask that you follow-up with your pharmacy.

## 2023-11-12 NOTE — Telephone Encounter (Signed)
 Copied from CRM [id number]. Topic: General - Call Back - No Documentation >> Nov 12, 2023  8:34 AM [employer] wrote: Reason for CRM: Patient is requesting a call back from Dr. Doylene Genet nurse to discuss refilling ondansetron (ZOFRAN) 4 MG tablet. She was informed that Dr. Rodolph Clap will not be back until 4/28.  Callback #: [phone]

## 2023-11-13 NOTE — Telephone Encounter (Signed)
 Medication refilled yesterday.

## 2023-11-16 ENCOUNTER — Other Ambulatory Visit (HOSPITAL_COMMUNITY): Payer: Self-pay

## 2023-12-15 NOTE — Progress Notes (Signed)
 Virtual Visit via Video Note  I connected with Mary Roman on 12/20/23 at  8:00 AM EDT by a video enabled telemedicine application and verified that I am speaking with the correct person using two identifiers.  Location: Patient: work Provider: home office Persons participated in the visit- patient, provider    I discussed the limitations of evaluation and management by telemedicine and the availability of in person appointments. The patient expressed understanding and agreed to proceed.     I discussed the assessment and treatment plan with the patient. The patient was provided an opportunity to ask questions and all were answered. The patient agreed with the plan and demonstrated an understanding of the instructions.   The patient was advised to call back or seek an in-person evaluation if the symptoms worsen or if the condition fails to improve as anticipated.    Todd Fossa, MD    [facility] MD/PA/NP OP Progress Note  12/20/2023 9:00 AM Mary Roman  MRN:  [id number]  Chief Complaint:  Chief Complaint  Patient presents with   Follow-up   HPI:  This is a follow-up appointment for depression, anxiety and insomnia.  She states that she is trying to get adjusted to the loss of her mother.  It has been very difficult especially she is stressed at work.  They have a shortage of staff, and she used to be able to talk with her mother when she was stressed.  She states that her mother was such a positive person, who never complained.  She just listened, and they may watch TV together.  She felt better afterwards.  She agrees to imagine what she would say, acknowledging her mother as an internal presence within herself.  She misses her sister, who lives in [place].  She thinks her mother was holding siblings together, and she now does not have much communication with her.  She states her brother, who stays at her mother's place.  The relationship has been easier.  She is  concerned about her fianc, who is unemployed again.  She also talks about her son, who has some issues with depression and anxiety.  She tries to have communication with her fianc about parenting.  She is also concerned about her daughter, who tends to internalize.  She feels her mood is all over the place.  However, she also recognizes that things are in control compared to before.  She sleeps up to 6 hours.  She has middle insomnia.  Her appetite tends to fluctuate, and she occasionally misses to eat due to work schedule.  She denies SI.  She agrees with the plans as outlined below.   According to the chart review, she was seen by Gaylyn Keas MD cardiology 10/2021 - "Therapeutic CPAP titration to determine optimal pressure required to alleviate sleep disordered breathing.  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  one 57 yo son, one 51 yo daughter  Visit Diagnosis:    ICD-10-CM   1. MDD (major depressive disorder), recurrent episode, mild (HCC)  F33.0     2. GAD (generalized anxiety disorder)  F41.1     3. Insomnia, unspecified type  G47.00       Past Psychiatric  History: Please see initial evaluation for full details. I have reviewed the history. No updates at this time.     Past Medical History:  Past Medical History:  Diagnosis Date   Absolute anemia 06/23/2018   Added automatically from request for surgery [id number]   Anemia    Anxiety    Backache 12/24/2007   Chronic back pain    Depression    Diabetes mellitus, type II (HCC)    Headache(784.0)    Hypertension    Metabolic syndrome X 04/12/2014   Obesity    Palpitation    Sickle cell trait (HCC)    Thyromegaly    Unspecified vitamin D  deficiency 02/19/2013     Past Surgical History:  Procedure Laterality Date   COLONOSCOPY WITH PROPOFOL  N/A 01/03/2021   Procedure: COLONOSCOPY WITH PROPOFOL ;  Surgeon: Urban Garden, MD;  Location: [facility];  Service: Gastroenterology;  Laterality: N/A;  AM   DILATATION AND CURETTAGE/HYSTEROSCOPY WITH MINERVA N/A 03/25/2019   Procedure: DILATATION AND CURETTAGE/HYSTEROSCOPY WITH  ATTEMPTED MINERVA ENDOMETRIAL ABLATION;  Surgeon: Wendelyn Halter, MD;  Location: [facility] ORS;  Service: Gynecology;  Laterality: N/A;   None      Family Psychiatric History: Please see initial evaluation for full details. I have reviewed the history. No updates at this time.     Family History:  Family History  Problem Relation Age of Onset   Diabetes Mother    Hypertension Mother    Stroke Mother    Colon cancer Mother    Alcohol abuse Mother    Cancer - Colon Mother 30   Pneumonia Father    Depression Sister    Hypertension Sister    Anxiety disorder Sister    Hypertension Sister    Leukemia Sister    Heart attack Maternal Uncle    Hypertension Maternal Grandfather    Diabetes Maternal Grandmother    Hypertension Maternal Grandmother    Depression Child     Social History:  Social History   Socioeconomic History   Marital status: Single    Spouse name: Not on file   Number of children: 2   Years of education: Not on file   Highest education level: Not on file  Occupational History   Occupation: employed in medical office   Tobacco Use   Smoking status: Never   Smokeless tobacco: Never  Vaping Use   Vaping status: Never Used  Substance and Sexual Activity   Alcohol use: No   Drug use: No   Sexual activity: Yes    Partners: Male    Birth control/protection: Other-see comments    Comment: bf had vasectomy  Other Topics Concern   Not on file  Social History Narrative   Not on file   Social Drivers of Health   Financial Resource Strain: Low Risk  (07/01/2023)   Overall Financial Resource Strain  (CARDIA)    Difficulty of Paying Living Expenses: Not very hard  Food Insecurity: No Food Insecurity (07/01/2023)   Hunger Vital Sign    Worried About Running Out of Food in the Last Year: Never true    Ran Out of Food in the Last Year: Never true  Transportation Needs: No Transportation Needs (07/01/2023)   PRAPARE - Administrator, Civil Service (Medical): No    Lack of Transportation (Non-Medical): No  Physical Activity: Inactive (07/01/2023)   Exercise Vital Sign    Days of Exercise per Week: 0 days    Minutes of Exercise per  Session: 0 min  Stress: Stress Concern Present (07/01/2023)   [employer] of Occupational Health - Occupational Stress Questionnaire    Feeling of Stress : To some extent  Social Connections: Moderately Integrated (07/01/2023)   Social Connection and Isolation Panel [NHANES]    Frequency of Communication with Friends and Family: Twice a week    Frequency of Social Gatherings with Friends and Family: Once a week    Attends Religious Services: 1 to 4 times per year    Active Member of Clubs or Organizations: No    Attends Engineer, structural: Never    Marital Status: Living with partner    Allergies: No Known Allergies  Metabolic Disorder Labs: Lab Results  Component Value Date   HGBA1C 12.6 (H) 11/05/2023   MPG 174.29 04/10/2022   MPG 240.3 08/25/2021   No results found for: "PROLACTIN" Lab Results  Component Value Date   CHOL 137 11/05/2023   TRIG 93 11/05/2023   HDL 36 (L) 11/05/2023   CHOLHDL 5.3 (H) 06/06/2023   VLDL 11 02/05/2022   LDLCALC 83 11/05/2023   LDLCALC 87 06/06/2023   Lab Results  Component Value Date   TSH 2.140 11/05/2023   TSH 0.924 10/17/2022    Therapeutic Level Labs: No results found for: "LITHIUM" No results found for: "VALPROATE" No results found for: "CBMZ"  Current Medications: Current Outpatient Medications  Medication Sig Dispense Refill   acetaminophen  (TYLENOL ) 500 MG tablet Take  1,000 mg by mouth every 6 (six) hours as needed (pain).     aspirin  EC 81 MG tablet Take 1 tablet (81 mg total) by mouth daily. 90 tablet 3   blood glucose meter kit and supplies Dispense based on patient and insurance preference. Once daily testing dx e11.9 1 each 0   Blood Pressure KIT Use to check blood pressure daily. 1 kit 0   buPROPion  (WELLBUTRIN  XL) 150 MG 24 hr tablet Take 3 tablets (450 mg total) by mouth daily. 270 tablet 1   empagliflozin  (JARDIANCE ) 25 MG TABS tablet Take 1 tablet (25 mg total) by mouth daily before breakfast. 90 tablet 3   fluticasone  (FLONASE ) 50 MCG/ACT nasal spray Place 2 sprays into both nostrils daily. 16 g 6   glipiZIDE  (GLUCOTROL  XL) 10 MG 24 hr tablet Take 2 tablets (20 mg total) by mouth in the morning with breakfast. 180 tablet 1   hydrOXYzine  (ATARAX ) 25 MG tablet Take 1 tablet (25 mg total) by mouth daily as needed for anxiety. 30 tablet 1   metoprolol  tartrate (LOPRESSOR ) 25 MG tablet Take 1 tablet (25 mg total) by mouth 2 (two) times daily. Please schedule appointment for future refills. Thank you 180 tablet 1   montelukast  (SINGULAIR ) 10 MG tablet Take 1 tablet (10 mg total) by mouth at bedtime. 30 tablet 3   olmesartan  (BENICAR ) 20 MG tablet Take 1 tablet (20 mg total) by mouth daily. 90 tablet 1   ondansetron  (ZOFRAN ) 4 MG tablet Take one tablet by mouth once daily as needed, for nausea 36 tablet 1   rosuvastatin  (CRESTOR ) 10 MG tablet Take 1 tablet (10 mg total) by mouth daily. 90 tablet 1   tirzepatide  (MOUNJARO ) 7.5 MG/0.5ML Pen Inject 7.5 mg into the skin once a week. 6 mL 1   triamterene -hydrochlorothiazide  (MAXZIDE -25) 37.5-25 MG tablet Take 1 tablet by mouth daily. 90 tablet 3   venlafaxine  XR (EFFEXOR -XR) 150 MG 24 hr capsule Take 1 capsule (150 mg total) by mouth daily. 90 capsule 1  venlafaxine  XR (EFFEXOR -XR) 75 MG 24 hr capsule Take 1 capsule (75 mg total) by mouth daily. Total of 225 mg daily. Take along with 150 mg cap 90 capsule 1    Vitamin D , Ergocalciferol , (DRISDOL ) 1.25 MG (50000 UNIT) CAPS capsule Take 1 capsule (50,000 Units total) by mouth every 7 (seven) days. 12 capsule 1   zaleplon  (SONATA ) 10 MG capsule Take 1 capsule (10 mg total) by mouth at bedtime as needed for sleep. 30 capsule 0   zaleplon  (SONATA ) 10 MG capsule Take 1 capsule (10 mg total) by mouth at bedtime as needed for sleep. 30 capsule 1   No current facility-administered medications for this visit.     Musculoskeletal: Strength & Muscle Tone: N/A Gait & Station: N/A Patient leans: N/A  Psychiatric Specialty Exam: Review of Systems  Psychiatric/Behavioral:  Positive for dysphoric mood and sleep disturbance. Negative for agitation, behavioral problems, confusion, decreased concentration, hallucinations, self-injury and suicidal ideas. The patient is nervous/anxious. The patient is not hyperactive.   All other systems reviewed and are negative.   Last menstrual period 01/18/2019.There is no height or weight on file to calculate BMI.  General Appearance: Well Groomed  Eye Contact:  Good  Speech:  Clear and Coherent  Volume:  Normal  Mood:  Anxious and Depressed  Affect:  Appropriate, Congruent, and slightly down, but brighter  Thought Process:  Coherent  Orientation:  Full (Time, Place, and Person)  Thought Content: Logical   Suicidal Thoughts:  No  Homicidal Thoughts:  No  Memory:  Immediate;   Good  Judgement:  Good  Insight:  Good  Psychomotor Activity:  Normal  Concentration:  Concentration: Good and Attention Span: Good  Recall:  Good  Fund of Knowledge: Good  Language: Good  Akathisia:  No  Handed:  Right  AIMS (if indicated): not done  Assets:  Communication Skills Desire for Improvement  ADL's:  Intact  Cognition: WNL  Sleep:  Fair   Screenings: GAD-7    Flowsheet Row Office Visit from 11/05/2023 in [facility] Office Visit from 07/01/2023 in [facility] Office Visit from 06/06/2023 in [facility] Office Visit from 02/06/2023 in [facility] Office Visit from 10/17/2022 in [facility]  Total GAD-7 Score 15 4 9 6 4       PHQ2-9    Flowsheet Row Office Visit from 11/05/2023 in [facility] Office Visit from 07/01/2023 in [facility] Office Visit from 06/06/2023 in [facility] Office Visit from 02/06/2023 in [facility] Office Visit from 10/17/2022 in [facility]  PHQ-2 Total Score 4 2 0 2 2  PHQ-9 Total Score 15 6 -- 9 8      Flowsheet Row Video Visit from 09/14/2021 in [facility] Video Visit from 08/10/2021 in [facility] Video Visit from 05/04/2021 in [facility]  C-SSRS RISK CATEGORY Error: Q3, 4, or 5 should not be populated when Q2 is No No Risk Low Risk        Assessment and Plan:  Mary Roman is a 53 y.o. year old female with a history of depression, mild OSA, diabetes, who presents for follow up appointment for below.   1. MDD (major depressive disorder), recurrent episode, mild (HCC)  2. GAD (generalized anxiety disorder) Acute stressors include: loss of her mother in Feb 2025, her fiancee with pending knee replacement due to diabetes, work related stress due to shortage of staff ,conflict with one of her sister  Other stressors include: financial strain, difficulty in communication with her daughter, conflict with her siblings   History:   There has been overall improvement in depressive symptoms and anxiety since taking medication more consistently.  Although she continues to report stress related to grieving the loss of her mother, work demands, and some disconnection with her sister, she appears less  overwhelmed.  Will continue current medication regimen.  Will continue venlafaxine , bupropion  and rexulti  toward target depression.  Will continue hydroxyzine  as needed for anxiety.   3. Insomnia, unspecified type Overall improving since restarting Sonata .  Will continue current dose to target insomnia.  According to the chart review, she was previously recommended for CPAP, and she is not aware of this direction ; she agrees to reach out to the provider for further direction.     Plan (she will contact the office if she needs a refill) Continue venlafaxine  225 mg daily  Continue bupropion  450 mg daily  Continue rexulti  1 mg daily  Continue Sonata  10 mg at night as needed for insomnia- [facility] Continue hydroxyzine  25 mg daily as needed for anxiety- [facility] Please contact the office of Gaylyn Keas MD, cardiology, last appointment in 10/2021 Next appointment: 7/14 at 8 am, video - on Mounjaro      Past trials of medication: fluoxetine , duloxetine , Abilify  (weight gain),  buspirone , Trazodone  ("weird" feeling) temazepam , Ambien , Lunesta , Xanax       The patient demonstrates the following risk factors for suicide: Chronic risk factors for suicide include: psychiatric disorder of depression, anxiety and chronic pain. Acute risk factors for suicide include: N/A. Protective factors for this patient include: positive social support, responsibility to others (children, family), coping skills and hope for the future. Considering these factors, the overall suicide risk at this point appears to be low. Patient is appropriate for outpatient follow up.    Collaboration of Care: Collaboration of Care: Other reviewed notes in Epic  Patient/Guardian was advised Release of Information must be obtained prior to any record release in order to collaborate their care with an outside provider. Patient/Guardian was advised if they have not already done so to contact the registration department to sign all necessary  forms in order for us  to release information regarding their care.   Consent: Patient/Guardian gives verbal consent for treatment and assignment of benefits for services provided during this visit. Patient/Guardian expressed understanding and agreed to proceed.    Todd Fossa, MD 12/20/2023, 9:00 AM

## 2023-12-16 ENCOUNTER — Other Ambulatory Visit (HOSPITAL_COMMUNITY): Payer: Self-pay

## 2023-12-17 ENCOUNTER — Other Ambulatory Visit (HOSPITAL_COMMUNITY): Payer: Self-pay

## 2023-12-20 ENCOUNTER — Telehealth: Admitting: Psychiatry

## 2023-12-20 ENCOUNTER — Encounter: Payer: Self-pay | Admitting: Psychiatry

## 2023-12-20 DIAGNOSIS — F330 Major depressive disorder, recurrent, mild: Secondary | ICD-10-CM | POA: Diagnosis not present

## 2023-12-20 DIAGNOSIS — G4700 Insomnia, unspecified: Secondary | ICD-10-CM | POA: Diagnosis not present

## 2023-12-20 DIAGNOSIS — F411 Generalized anxiety disorder: Secondary | ICD-10-CM

## 2023-12-20 NOTE — Patient Instructions (Addendum)
 Continue venlafaxine  225 mg daily  Continue bupropion  450 mg daily  Continue rexulti  1 mg daily  Continue Sonata  10 mg at night as needed for insomnia Continue hydroxyzine  25 mg daily as needed for anxiety Please contact the office of Gaylyn Keas MD, cardiology about the sleep study result, last appointment in 10/2021 Next appointment: 7/14 at 8 am,

## 2023-12-25 ENCOUNTER — Other Ambulatory Visit (HOSPITAL_COMMUNITY): Payer: Self-pay

## 2024-01-02 ENCOUNTER — Other Ambulatory Visit: Payer: Self-pay | Admitting: Psychiatry

## 2024-01-07 ENCOUNTER — Ambulatory Visit: Admitting: Family Medicine

## 2024-01-08 ENCOUNTER — Other Ambulatory Visit (HOSPITAL_COMMUNITY): Payer: Self-pay

## 2024-01-15 ENCOUNTER — Ambulatory Visit (HOSPITAL_COMMUNITY)
Admission: RE | Admit: 2024-01-15 | Discharge: 2024-01-15 | Disposition: A | Discharge status: Home / Self Care | Source: Ambulatory Visit | Attending: Family Medicine | Admitting: Family Medicine

## 2024-01-15 DIAGNOSIS — Z1231 Encounter for screening mammogram for malignant neoplasm of breast: Secondary | ICD-10-CM | POA: Insufficient documentation

## 2024-01-22 ENCOUNTER — Other Ambulatory Visit: Payer: Self-pay | Admitting: Family Medicine

## 2024-01-22 ENCOUNTER — Other Ambulatory Visit: Payer: Self-pay | Admitting: Psychiatry

## 2024-01-22 ENCOUNTER — Other Ambulatory Visit: Payer: Self-pay

## 2024-01-22 DIAGNOSIS — F330 Major depressive disorder, recurrent, mild: Secondary | ICD-10-CM

## 2024-01-22 MED ORDER — OLMESARTAN MEDOXOMIL 20 MG PO TABS
20.0000 mg | ORAL_TABLET | Freq: Every day | ORAL | 1 refills | Status: AC
  Filled 2024-01-22: qty 90, 90d supply, fill #0
  Filled 2024-05-19: qty 90, 90d supply, fill #1

## 2024-01-22 MED ORDER — GLIPIZIDE ER 10 MG PO TB24
20.0000 mg | ORAL_TABLET | Freq: Every morning | ORAL | 1 refills | Status: DC
  Filled 2024-01-22: qty 180, 90d supply, fill #0

## 2024-01-22 MED ORDER — VENLAFAXINE HCL ER 150 MG PO CP24
150.0000 mg | ORAL_CAPSULE | Freq: Every day | ORAL | 1 refills | Status: DC
  Filled 2024-01-22: qty 90, 90d supply, fill #0
  Filled 2024-05-19: qty 90, 90d supply, fill #1

## 2024-01-22 MED ORDER — VENLAFAXINE HCL ER 75 MG PO CP24
75.0000 mg | ORAL_CAPSULE | Freq: Every day | ORAL | 1 refills | Status: DC
  Filled 2024-01-22: qty 90, 90d supply, fill #0
  Filled 2024-05-19: qty 90, 90d supply, fill #1

## 2024-02-03 NOTE — Progress Notes (Signed)
 Virtual Visit via Video Note  I connected with Mary Roman on 02/10/24 at  8:00 AM EDT by a video enabled telemedicine application and verified that I am speaking with the correct person using two identifiers.  Location: Patient: home Provider: office Persons participated in the visit- patient, provider    I discussed the limitations of evaluation and management by telemedicine and the availability of in person appointments. The patient expressed understanding and agreed to proceed.    I discussed the assessment and treatment plan with the patient. The patient was provided an opportunity to ask questions and all were answered. The patient agreed with the plan and demonstrated an understanding of the instructions.   The patient was advised to call back or seek an in-person evaluation if the symptoms worsen or if the condition fails to improve as anticipated.   Katheren Sleet, MD   [facility] MD/PA/NP OP Progress Note  02/10/2024 8:37 AM Mary Roman  MRN:  [id number]  Chief Complaint:  Chief Complaint  Patient presents with   Follow-up   HPI:  This is a follow-up appointment for depression, anxiety and insomnia.  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  She was able to enjoy safe staying with her daughter.  She will be going through her mother's item with her sister.  She is glad that she was able to reconnect with her.  She has not contacted with other siblings, stating that her mother was a glue.  Although she thinks about her a lot, someday is better than the other.  She feels that she does not have enough time to grieve the either as the time passes very fast.  She feels drained.  She has initial and middle insomnia if she were not to take Sonata , although she can sleeps up to 6 hours with the medication.  She tends to think about things happening the past, a concern about her daughter, who will be in the senior year.   She denies panic attacks.  She thinks her depression has been better since taking medication consistently.  She denies SI, HI, hallucinations.  She feels good about her weight loss, and she feels better with this.  She denies alcohol use or substance.  She agrees with the plans as outlined.  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  one 27 yo son, one 60 yo daughter  Visit Diagnosis:    ICD-10-CM   1. MDD (major depressive disorder), recurrent episode, mild (HCC)  F33.0     2. Insomnia, unspecified type  G47.00 zaleplon  (SONATA ) 10 MG capsule    3. GAD (generalized anxiety disorder)  F41.1       Past Psychiatric History: Please see initial evaluation for full details. I have reviewed the history. No updates at this time.     Past Medical History:  Past Medical History:  Diagnosis Date   Absolute anemia 06/23/2018   Added automatically from request for surgery [id number]   Anemia    Anxiety    Backache 12/24/2007   Chronic back pain    Depression    Diabetes mellitus, type II (HCC)    Headache(784.0)  Hypertension    Metabolic syndrome X 04/12/2014   Obesity    Palpitation    Sickle cell trait (HCC)    Thyromegaly    Unspecified vitamin D  deficiency 02/19/2013    Past Surgical History:  Procedure Laterality Date   COLONOSCOPY WITH PROPOFOL  N/A 01/03/2021   Procedure: COLONOSCOPY WITH PROPOFOL ;  Surgeon: Eartha Angelia Sieving, MD;  Location: [facility];  Service: Gastroenterology;  Laterality: N/A;  AM   DILATATION AND CURETTAGE/HYSTEROSCOPY WITH MINERVA N/A 03/25/2019   Procedure: DILATATION AND CURETTAGE/HYSTEROSCOPY WITH  ATTEMPTED MINERVA ENDOMETRIAL ABLATION;  Surgeon: Jayne Vonn DEL, MD;  Location: [facility]  ORS;  Service: Gynecology;  Laterality: N/A;   None      Family Psychiatric History: Please see initial evaluation for full details. I have reviewed the history. No updates at this time.     Family History:  Family History  Problem Relation Age of Onset   Diabetes Mother    Hypertension Mother    Stroke Mother    Colon cancer Mother    Alcohol abuse Mother    Cancer - Colon Mother 44   Pneumonia Father    Depression Sister    Hypertension Sister    Anxiety disorder Sister    Hypertension Sister    Leukemia Sister    Heart attack Maternal Uncle    Hypertension Maternal Grandfather    Diabetes Maternal Grandmother    Hypertension Maternal Grandmother    Depression Child     Social History:  Social History   Socioeconomic History   Marital status: Single    Spouse name: Not on file   Number of children: 2   Years of education: Not on file   Highest education level: Not on file  Occupational History   Occupation: employed in medical office   Tobacco Use   Smoking status: Never   Smokeless tobacco: Never  Vaping Use   Vaping status: Never Used  Substance and Sexual Activity   Alcohol use: No   Drug use: No   Sexual activity: Yes    Partners: Male    Birth control/protection: Other-see comments    Comment: bf had vasectomy  Other Topics Concern   Not on file  Social History Narrative   Not on file   Social Drivers of Health   Financial Resource Strain: Low Risk  (07/01/2023)   Overall Financial Resource Strain (CARDIA)    Difficulty of Paying Living Expenses: Not very hard  Food Insecurity: No Food Insecurity (07/01/2023)   Hunger Vital Sign    Worried About Running Out of Food in the Last Year: Never true    Ran Out of Food in the Last Year: Never true  Transportation Needs: No Transportation Needs (07/01/2023)   PRAPARE - Administrator, Civil Service (Medical): No    Lack of Transportation (Non-Medical): No  Physical Activity: Inactive  (07/01/2023)   Exercise Vital Sign    Days of Exercise per Week: 0 days    Minutes of Exercise per Session: 0 min  Stress: Stress Concern Present (07/01/2023)   [employer] of Occupational Health - Occupational Stress Questionnaire    Feeling of Stress : To some extent  Social Connections: Moderately Integrated (07/01/2023)   Social Connection and Isolation Panel    Frequency of Communication with Friends and Family: Twice a week    Frequency of Social Gatherings with Friends and Family: Once a week    Attends Religious Services: 1 to 4 times per year  Active Member of Clubs or Organizations: No    Attends Banker Meetings: Never    Marital Status: Living with partner    Allergies: No Known Allergies  Metabolic Disorder Labs: Lab Results  Component Value Date   HGBA1C 12.6 (H) 11/05/2023   MPG 174.29 04/10/2022   MPG 240.3 08/25/2021   No results found for: PROLACTIN Lab Results  Component Value Date   CHOL 137 11/05/2023   TRIG 93 11/05/2023   HDL 36 (L) 11/05/2023   CHOLHDL 5.3 (H) 06/06/2023   VLDL 11 02/05/2022   LDLCALC 83 11/05/2023   LDLCALC 87 06/06/2023   Lab Results  Component Value Date   TSH 2.140 11/05/2023   TSH 0.924 10/17/2022    Therapeutic Level Labs: No results found for: LITHIUM No results found for: VALPROATE No results found for: CBMZ  Current Medications: Current Outpatient Medications  Medication Sig Dispense Refill   hydrOXYzine  (ATARAX ) 25 MG tablet Take 25 mg by mouth daily as needed (anxiety).     acetaminophen  (TYLENOL ) 500 MG tablet Take 1,000 mg by mouth every 6 (six) hours as needed (pain).     aspirin  EC 81 MG tablet Take 1 tablet (81 mg total) by mouth daily. 90 tablet 3   blood glucose meter kit and supplies Dispense based on patient and insurance preference. Once daily testing dx e11.9 1 each 0   Blood Pressure KIT Use to check blood pressure daily. 1 kit 0   buPROPion  (WELLBUTRIN  XL) 150 MG 24 hr  tablet Take 3 tablets (450 mg total) by mouth daily. 270 tablet 1   empagliflozin  (JARDIANCE ) 25 MG TABS tablet Take 1 tablet (25 mg total) by mouth daily before breakfast. 90 tablet 3   fluticasone  (FLONASE ) 50 MCG/ACT nasal spray Place 2 sprays into both nostrils daily. 16 g 6   glipiZIDE  (GLUCOTROL  XL) 10 MG 24 hr tablet Take 2 tablets (20 mg total) by mouth in the morning with breakfast. 180 tablet 1   metoprolol  tartrate (LOPRESSOR ) 25 MG tablet Take 1 tablet (25 mg total) by mouth 2 (two) times daily. Please schedule appointment for future refills. Thank you 180 tablet 1   montelukast  (SINGULAIR ) 10 MG tablet Take 1 tablet (10 mg total) by mouth at bedtime. 30 tablet 3   olmesartan  (BENICAR ) 20 MG tablet Take 1 tablet (20 mg total) by mouth daily. 90 tablet 1   ondansetron  (ZOFRAN ) 4 MG tablet Take one tablet by mouth once daily as needed, for nausea 36 tablet 1   rosuvastatin  (CRESTOR ) 10 MG tablet Take 1 tablet (10 mg total) by mouth daily. 90 tablet 1   tirzepatide  (MOUNJARO ) 7.5 MG/0.5ML Pen Inject 7.5 mg into the skin once a week. 6 mL 1   triamterene -hydrochlorothiazide  (MAXZIDE -25) 37.5-25 MG tablet Take 1 tablet by mouth daily. 90 tablet 3   venlafaxine  XR (EFFEXOR -XR) 150 MG 24 hr capsule Take 1 capsule (150 mg total) by mouth daily. 90 capsule 1   venlafaxine  XR (EFFEXOR -XR) 75 MG 24 hr capsule Take 1 capsule (75 mg total) by mouth daily. Total of 225 mg daily. Take along with 150 mg cap 90 capsule 1   Vitamin D , Ergocalciferol , (DRISDOL ) 1.25 MG (50000 UNIT) CAPS capsule Take 1 capsule (50,000 Units total) by mouth every 7 (seven) days. 12 capsule 1   zaleplon  (SONATA ) 10 MG capsule Take 1 capsule (10 mg total) by mouth at bedtime as needed for sleep. 30 capsule 1   [START ON 03/11/2024] zaleplon  (SONATA ) 10  MG capsule Take 1 capsule (10 mg total) by mouth at bedtime as needed for sleep. 30 capsule 0   No current facility-administered medications for this visit.      Musculoskeletal: Strength & Muscle Tone: N/A Gait & Station: N/A Patient leans: N/A  Psychiatric Specialty Exam: Review of Systems  Last menstrual period 01/18/2019.There is no height or weight on file to calculate BMI.  General Appearance: Well Groomed  Eye Contact:  Good  Speech:  Clear and Coherent  Volume:  Normal  Mood:  Anxious  Affect:  Appropriate, Congruent, and calm  Thought Process:  Coherent  Orientation:  Full (Time, Place, and Person)  Thought Content: Logical   Suicidal Thoughts:  No  Homicidal Thoughts:  No  Memory:  Immediate;   Good  Judgement:  Good  Insight:  Good  Psychomotor Activity:  Normal  Concentration:  Concentration: Good and Attention Span: Good  Recall:  Good  Fund of Knowledge: Good  Language: Good  Akathisia:  No  Handed:  Right  AIMS (if indicated): not done  Assets:  Communication Skills Desire for Improvement  ADL's:  Intact  Cognition: WNL  Sleep:  Fair   Screenings: GAD-7    Flowsheet Row Office Visit from 11/05/2023 in [facility] Office Visit from 07/01/2023 in [facility] Office Visit from 06/06/2023 in [facility] Office Visit from 02/06/2023 in [facility] Office Visit from 10/17/2022 in [facility]  Total GAD-7 Score 15 4 9 6 4    PHQ2-9    Flowsheet Row Office Visit from 11/05/2023 in [facility] Office Visit from 07/01/2023 in [facility] at [facility] Office Visit from 06/06/2023 in [facility] Office Visit from 02/06/2023 in [facility] Office Visit from 10/17/2022 in [facility]  PHQ-2 Total Score 4 2 0 2 2  PHQ-9 Total Score 15 6 -- 9 8   Flowsheet Row Video Visit from 09/14/2021 in [facility] Video Visit from 08/10/2021  in [facility] Video Visit from 05/04/2021 in [facility]  C-SSRS RISK CATEGORY Error: Q3, 4, or 5 should not be populated when Q2 is No No Risk Low Risk     Assessment and Plan:  Mary Roman is a 53 y.o. year old female with a history of depression, mild OSA, diabetes, who presents for follow up appointment for below.   2. MDD (major depressive disorder), recurrent episode, mild (HCC) 3. GAD (generalized anxiety disorder)  Acute stressors include: loss of her mother in Feb 2025, her fiancee with pending knee replacement due to diabetes, work related stress due to shortage of staff ,conflict with one of her sister  Other stressors include: financial strain, difficulty in communication with her daughter, conflict with her siblings   History:   There has been overall improvement in depressive symptoms since taking medication more consistently, although she continues to experience some anxiety especially related to work related stress.  She mentioned reconnecting with her sister and is planning to go through her late mother's belongings, whom she lost in February.  Will continue current medication regimen.  Will continue venlafaxine , bupropion  and rexulti  to target depression.  Will continue hydroxyzine  as needed for anxiety.   1. Insomnia, unspecified type - per chart, she was  recommended to try CPAP; will reconnect with the provider. Although there has been no more improvement since restarting Sonata , she struggles with initial and middle insomnia without this medication .  She has an upcoming appointment with her provider regarding the recommended CPAP machine ; will continue current dose as needed for insomnia .     Plan (she will contact the office if she needs a refill) Continue venlafaxine  225 mg daily  Continue bupropion  450 mg daily  Continue rexulti  1 mg daily  Continue Sonata  10 mg at night as  needed for insomnia- [facility] Continue hydroxyzine  25 mg daily as needed for anxiety- [facility]. She rarely takes this medication Next appointment: 8/25 at 2:30, video - on Mounjaro    Past trials of medication: fluoxetine , duloxetine , Abilify  (weight gain),  buspirone , Trazodone  (weird feeling) temazepam , Ambien , Lunesta , Xanax       The patient demonstrates the following risk factors for suicide: Chronic risk factors for suicide include: psychiatric disorder of depression, anxiety and chronic pain. Acute risk factors for suicide include: N/A. Protective factors for this patient include: positive social support, responsibility to others (children, family), coping skills and hope for the future. Considering these factors, the overall suicide risk at this point appears to be low. Patient is appropriate for outpatient follow up.    Collaboration of Care: Collaboration of Care: Other reviewed notes in Epic  Patient/Guardian was advised Release of Information must be obtained prior to any record release in order to collaborate their care with an outside provider. Patient/Guardian was advised if they have not already done so to contact the registration department to sign all necessary forms in order for us  to release information regarding their care.   Consent: Patient/Guardian gives verbal consent for treatment and assignment of benefits for services provided during this visit. Patient/Guardian expressed understanding and agreed to proceed.    Katheren Sleet, MD 02/10/2024, 8:37 AM

## 2024-02-10 ENCOUNTER — Telehealth (INDEPENDENT_AMBULATORY_CARE_PROVIDER_SITE_OTHER): Admitting: Psychiatry

## 2024-02-10 ENCOUNTER — Encounter: Payer: Self-pay | Admitting: Psychiatry

## 2024-02-10 DIAGNOSIS — G4700 Insomnia, unspecified: Secondary | ICD-10-CM

## 2024-02-10 DIAGNOSIS — F411 Generalized anxiety disorder: Secondary | ICD-10-CM | POA: Diagnosis not present

## 2024-02-10 DIAGNOSIS — F330 Major depressive disorder, recurrent, mild: Secondary | ICD-10-CM

## 2024-02-10 MED ORDER — ZALEPLON 10 MG PO CAPS
10.0000 mg | ORAL_CAPSULE | Freq: Every evening | ORAL | 0 refills | Status: DC | PRN
Start: 2024-03-11 — End: 2024-04-09

## 2024-02-13 ENCOUNTER — Other Ambulatory Visit: Payer: Self-pay | Admitting: Psychiatry

## 2024-02-18 ENCOUNTER — Other Ambulatory Visit (HOSPITAL_COMMUNITY): Payer: Self-pay

## 2024-02-21 ENCOUNTER — Ambulatory Visit: Admitting: Family Medicine

## 2024-03-13 ENCOUNTER — Other Ambulatory Visit: Payer: Self-pay | Admitting: Family Medicine

## 2024-03-13 ENCOUNTER — Other Ambulatory Visit (HOSPITAL_COMMUNITY): Payer: Self-pay

## 2024-03-13 ENCOUNTER — Other Ambulatory Visit: Payer: Self-pay

## 2024-03-13 MED ORDER — ROSUVASTATIN CALCIUM 10 MG PO TABS
10.0000 mg | ORAL_TABLET | Freq: Every day | ORAL | 1 refills | Status: DC
  Filled 2024-03-13: qty 90, 90d supply, fill #0

## 2024-03-19 NOTE — Progress Notes (Signed)
 Sent a video visit link through Epic, but the patient didn't sign in. Tried calling for today's appointment, but got no answer. Left a voicemail instructing the patient to contact the office at [phone].

## 2024-03-20 ENCOUNTER — Encounter: Payer: Self-pay | Admitting: Family Medicine

## 2024-03-20 ENCOUNTER — Other Ambulatory Visit: Payer: Self-pay | Admitting: Family Medicine

## 2024-03-20 ENCOUNTER — Ambulatory Visit (INDEPENDENT_AMBULATORY_CARE_PROVIDER_SITE_OTHER): Admitting: Family Medicine

## 2024-03-20 VITALS — BP 113/70 | HR 74 | Resp 16 | Ht 65.0 in | Wt 188.0 lb

## 2024-03-20 DIAGNOSIS — E1165 Type 2 diabetes mellitus with hyperglycemia: Secondary | ICD-10-CM | POA: Diagnosis not present

## 2024-03-20 DIAGNOSIS — G4730 Sleep apnea, unspecified: Secondary | ICD-10-CM | POA: Diagnosis not present

## 2024-03-20 DIAGNOSIS — M7989 Other specified soft tissue disorders: Secondary | ICD-10-CM

## 2024-03-20 DIAGNOSIS — F331 Major depressive disorder, recurrent, moderate: Secondary | ICD-10-CM

## 2024-03-20 DIAGNOSIS — I10 Essential (primary) hypertension: Secondary | ICD-10-CM

## 2024-03-20 DIAGNOSIS — E785 Hyperlipidemia, unspecified: Secondary | ICD-10-CM | POA: Diagnosis not present

## 2024-03-20 DIAGNOSIS — E1159 Type 2 diabetes mellitus with other circulatory complications: Secondary | ICD-10-CM

## 2024-03-20 DIAGNOSIS — E559 Vitamin D deficiency, unspecified: Secondary | ICD-10-CM | POA: Diagnosis not present

## 2024-03-20 NOTE — Patient Instructions (Addendum)
 F/U first week in December, MUST HAVE lABS drawn in Novemebr as indicated on paperwork , fasting  You are being referred to diabetic educator, NEED to go!  You are being referred to local pulmonary office to be evaluated for sleep apnea, NEED TO GO  NEED to test and record blood sugars at least twice daily, fasting and and at bedtime Goal for fasting blood sugar ranges from 80 to 120 and 2 hours after any meal or at bedtime should be between 130 to 170.   You are referred for US  of legs due to pain and swelling  Try to start walking for 10 to 15 mins daily  HBA1C, lipid, cmp and EGFr fasting 11/22 or shortley after  PLEASE get rid of cookies, chips and sodas, trade for water  , vegetables   I am cheering you on for success!!. YOU CAN DO THIS!  Thanks for choosing [facility], we consider it a privelige to serve you.

## 2024-03-21 LAB — CBC WITH DIFFERENTIAL/PLATELET
Basophils Absolute: 0.1 x10E3/uL (ref 0.0–0.2)
Basos: 1 %
EOS (ABSOLUTE): 0.2 x10E3/uL (ref 0.0–0.4)
Eos: 2 %
Hematocrit: 38.3 % (ref 34.0–46.6)
Hemoglobin: 12.5 g/dL (ref 11.1–15.9)
Immature Grans (Abs): 0 x10E3/uL (ref 0.0–0.1)
Immature Granulocytes: 0 %
Lymphocytes Absolute: 3.7 x10E3/uL — ABNORMAL HIGH (ref 0.7–3.1)
Lymphs: 41 %
MCH: 29.3 pg (ref 26.6–33.0)
MCHC: 32.6 g/dL (ref 31.5–35.7)
MCV: 90 fL (ref 79–97)
Monocytes Absolute: 0.7 x10E3/uL (ref 0.1–0.9)
Monocytes: 7 %
Neutrophils Absolute: 4.3 x10E3/uL (ref 1.4–7.0)
Neutrophils: 49 %
Platelets: 498 x10E3/uL — ABNORMAL HIGH (ref 150–450)
RBC: 4.27 x10E6/uL (ref 3.77–5.28)
RDW: 13.1 % (ref 11.7–15.4)
WBC: 8.9 x10E3/uL (ref 3.4–10.8)

## 2024-03-21 LAB — VITAMIN D 25 HYDROXY (VIT D DEFICIENCY, FRACTURES): Vit D, 25-Hydroxy: 28.4 ng/mL — ABNORMAL LOW (ref 30.0–100.0)

## 2024-03-21 LAB — BMP8+EGFR
BUN/Creatinine Ratio: 8 — ABNORMAL LOW (ref 9–23)
BUN: 8 mg/dL (ref 6–24)
CO2: 19 mmol/L — ABNORMAL LOW (ref 20–29)
Calcium: 10.1 mg/dL (ref 8.7–10.2)
Chloride: 101 mmol/L (ref 96–106)
Creatinine, Ser: 1 mg/dL (ref 0.57–1.00)
Glucose: 205 mg/dL — ABNORMAL HIGH (ref 70–99)
Potassium: 3.9 mmol/L (ref 3.5–5.2)
Sodium: 139 mmol/L (ref 134–144)
eGFR: 67 mL/min/1.73 (ref >59)

## 2024-03-22 ENCOUNTER — Ambulatory Visit: Payer: Self-pay | Admitting: Family Medicine

## 2024-03-22 ENCOUNTER — Encounter: Payer: Self-pay | Admitting: Family Medicine

## 2024-03-22 MED ORDER — TIRZEPATIDE 7.5 MG/0.5ML ~~LOC~~ SOAJ
7.5000 mg | SUBCUTANEOUS | 1 refills | Status: DC
  Filled 2024-03-22 – 2024-04-13 (×2): qty 6, 84d supply, fill #0
  Filled 2024-06-09: qty 6, 84d supply, fill #1
  Filled 2024-06-22: qty 6, 84d supply, fill #0
  Filled 2024-06-22 (×2): qty 6, 84d supply, fill #1

## 2024-03-22 MED ORDER — GLIPIZIDE ER 10 MG PO TB24
20.0000 mg | ORAL_TABLET | Freq: Every morning | ORAL | 1 refills | Status: AC
  Filled 2024-03-22 – 2024-05-19 (×2): qty 180, 90d supply, fill #0
  Filled 2024-08-13 (×2): qty 180, 90d supply, fill #1

## 2024-03-22 MED ORDER — ROSUVASTATIN CALCIUM 10 MG PO TABS
10.0000 mg | ORAL_TABLET | Freq: Every day | ORAL | 1 refills | Status: AC
  Filled 2024-03-22 – 2024-06-19 (×2): qty 90, 90d supply, fill #0
  Filled 2024-08-03 – 2024-08-31 (×5): qty 90, 90d supply, fill #1

## 2024-03-22 MED ORDER — TRIAMTERENE-HCTZ 37.5-25 MG PO TABS
1.0000 | ORAL_TABLET | Freq: Every day | ORAL | 3 refills | Status: AC
  Filled 2024-03-22: qty 90, 90d supply, fill #0
  Filled 2024-06-19: qty 90, 90d supply, fill #1

## 2024-03-22 MED ORDER — VITAMIN D (ERGOCALCIFEROL) 1.25 MG (50000 UNIT) PO CAPS
50000.0000 [IU] | ORAL_CAPSULE | ORAL | 1 refills | Status: AC
  Filled 2024-03-22: qty 12, 84d supply, fill #0
  Filled 2024-06-19: qty 12, 84d supply, fill #1

## 2024-03-22 NOTE — Progress Notes (Unsigned)
   Mary Roman     MRN: [id number]      DOB: Nov 18, 1970  Chief Complaint  Patient presents with   Medical Management of Chronic Issues    Follow up    Numbness    Pt states she gets a numb/squeezing feeling a couple times a week in her right leg     HPI Mary Roman is here for follow up and re-evaluation of chronic medical conditions, medication management and review of any available recent lab and radiology data.  Preventive health is updated, specifically  Cancer screening and Immunization.   Questions or concerns regarding consultations or procedures which the PT has had in the interim are  addressed. The PT denies any adverse reactions to current medications since the last visit.  C/o pAIN  and swelling of both legs  for weeks, wants to be checke for clots Excessive snoring and fatigue needs sleep study Ongoing poor food choice esp where snacks are concerned  ROS Denies recent fever or chills. Denies sinus pressure, nasal congestion, ear pain or sore throat. Denies chest congestion, productive cough or wheezing. Denies chest pains, palpitations and leg swelling Denies abdominal pain, nausea, vomiting,diarrhea or constipation.   Denies dysuria, frequency, hesitancy or incontinence. Denies joint pain, swelling and limitation in mobility. Denies headaches, seizures, numbness, or tingling. Denies depression, anxiety or insomnia. Denies skin break down or rash.   PE  BP 113/70   Pulse 74   Resp 16   Ht 5' 5 (1.651 m)   Wt 188 lb 0.6 oz (85.3 kg)   LMP 01/18/2019   SpO2 97%   BMI 31.29 kg/m   Patient alert and oriented and in no cardiopulmonary distress.  HEENT: No facial asymmetry, EOMI,     Neck supple .  Chest: Clear to auscultation bilaterally.  CVS: S1, S2 no murmurs, no S3.Regular rate.  ABD: Soft non tender.   Ext: No edema  MS: Adequate ROM spine, shoulders, hips and knees.  Skin: Intact, no ulcerations or rash noted.  Psych: Good eye  contact, normal affect. Memory intact not anxious or depressed appearing.  CNS: CN 2-12 intact, power,  normal throughout.no focal deficits noted.   Assessment & Plan  No problem-specific Assessment & Plan notes found for this encounter.

## 2024-03-23 ENCOUNTER — Other Ambulatory Visit: Payer: Self-pay

## 2024-03-23 ENCOUNTER — Telehealth (INDEPENDENT_AMBULATORY_CARE_PROVIDER_SITE_OTHER): Payer: Self-pay | Admitting: Psychiatry

## 2024-03-23 ENCOUNTER — Encounter: Payer: Self-pay | Admitting: Obstetrics & Gynecology

## 2024-03-23 ENCOUNTER — Other Ambulatory Visit (HOSPITAL_COMMUNITY): Payer: Self-pay

## 2024-03-23 ENCOUNTER — Other Ambulatory Visit: Payer: Self-pay | Admitting: Obstetrics & Gynecology

## 2024-03-23 DIAGNOSIS — M7989 Other specified soft tissue disorders: Secondary | ICD-10-CM | POA: Insufficient documentation

## 2024-03-23 DIAGNOSIS — E1165 Type 2 diabetes mellitus with hyperglycemia: Secondary | ICD-10-CM | POA: Insufficient documentation

## 2024-03-23 DIAGNOSIS — Z91199 Patient's noncompliance with other medical treatment and regimen due to unspecified reason: Secondary | ICD-10-CM

## 2024-03-23 NOTE — Assessment & Plan Note (Signed)
 Uncontrolled x 1 year, lacks  energy  to do the necessary to reverse this . Needs basic educATION

## 2024-03-23 NOTE — Assessment & Plan Note (Signed)
 Updated lab needed at/ before next visit.

## 2024-03-23 NOTE — Assessment & Plan Note (Signed)
 Refer for eval od sleep apnea, psych has been requesting this and pt has been challenged getting appts, will refer to local pulmonary

## 2024-03-23 NOTE — Assessment & Plan Note (Signed)
 Venous doppler study ordered.

## 2024-03-23 NOTE — Assessment & Plan Note (Signed)
 Improved managed by Psych

## 2024-03-24 ENCOUNTER — Other Ambulatory Visit: Payer: Self-pay | Admitting: Obstetrics & Gynecology

## 2024-03-24 NOTE — Telephone Encounter (Signed)
 This a 2nd attempt..the patient is requesting her megestrol  medication to be sent to the [facility]. Please advise

## 2024-03-24 NOTE — Addendum Note (Signed)
 Addended by: BERNARDO ALAN CROME on: 03/24/2024 12:15 PM   Modules accepted: Orders

## 2024-03-25 LAB — BAYER DCA HB A1C WAIVED: HB A1C (BAYER DCA - WAIVED): 12 % — ABNORMAL HIGH (ref 4.8–5.6)

## 2024-03-25 MED ORDER — MEGESTROL ACETATE 40 MG PO TABS: 40.0000 mg | ORAL_TABLET | Freq: Every day | ORAL | 11 refills | Status: DC

## 2024-03-26 ENCOUNTER — Ambulatory Visit (HOSPITAL_COMMUNITY)
Admission: RE | Admit: 2024-03-26 | Discharge: 2024-03-26 | Disposition: A | Discharge status: Home / Self Care | Source: Ambulatory Visit | Attending: Family Medicine | Admitting: Family Medicine

## 2024-03-26 ENCOUNTER — Other Ambulatory Visit: Payer: Self-pay | Admitting: Obstetrics & Gynecology

## 2024-03-26 DIAGNOSIS — M7989 Other specified soft tissue disorders: Secondary | ICD-10-CM | POA: Insufficient documentation

## 2024-03-26 DIAGNOSIS — N921 Excessive and frequent menstruation with irregular cycle: Secondary | ICD-10-CM

## 2024-03-26 MED ORDER — MEGESTROL ACETATE 40 MG PO TABS: 40.0000 mg | ORAL_TABLET | Freq: Every day | ORAL | 11 refills | Status: AC

## 2024-03-26 NOTE — Progress Notes (Signed)
 Rx for Megace , message sent to patient  Mary Seufert, DO Attending Obstetrician & Gynecologist, [facility] for [facility], [facility]

## 2024-04-09 ENCOUNTER — Other Ambulatory Visit: Payer: Self-pay | Admitting: Psychiatry

## 2024-04-09 ENCOUNTER — Telehealth: Payer: Self-pay

## 2024-04-09 DIAGNOSIS — G4700 Insomnia, unspecified: Secondary | ICD-10-CM

## 2024-04-09 MED ORDER — ZALEPLON 10 MG PO CAPS: 10.0000 mg | ORAL_CAPSULE | Freq: Every evening | ORAL | 1 refills | Status: DC | PRN

## 2024-04-09 NOTE — Telephone Encounter (Signed)
 Patient called to request a refill of zaleplon  (SONATA ) 10 MG capsule    Last visit 02-10-24 Next visit 05-29-24    Preferred Pharmacies   [facility] [place], [place] - [address] [place] Phone: [phone]  Fax: [phone]

## 2024-04-09 NOTE — Telephone Encounter (Signed)
 Ordered

## 2024-04-10 NOTE — Telephone Encounter (Signed)
 Patient never picked up her cpap machine.

## 2024-04-12 NOTE — Progress Notes (Addendum)
 Virtual Visit via Video Note  I connected with Mary Roman on 04/15/24 at  3:00 PM EDT by a video enabled telemedicine application and verified that I am speaking with the correct person using two identifiers.  Location: Patient: car Provider: home office Persons participated in the visit- patient, provider    I discussed the limitations of evaluation and management by telemedicine and the availability of in person appointments. The patient expressed understanding and agreed to proceed.    I discussed the assessment and treatment plan with the patient. The patient was provided an opportunity to ask questions and all were answered. The patient agreed with the plan and demonstrated an understanding of the instructions.   The patient was advised to call back or seek an in-person evaluation if the symptoms worsen or if the condition fails to improve as anticipated.  Katheren Sleet, MD    [facility] MD/PA/NP OP Progress Note  04/15/2024 3:33 PM Mary Roman  MRN:  [id number]  Chief Complaint:  Chief Complaint  Patient presents with   Follow-up   HPI:  This is a follow-up appointment for depression, anxiety and insomnia.  She states that she has stress as the shortage of staff at work.  She has good days and bad days.  She tends to feel overwhelmed.  Although she used to enjoy working, she does not feel that way anymore.  She tends to react to things, and feels irritable.  She has been sleeping fair with the medication.  She denies concern about anxiety, and denies panic attacks.  She denies SI, HI, hallucinations.  She has been taking medication consistently.  She agrees with the plans as outlined below.   Visit Diagnosis:    ICD-10-CM   1. MDD (major depressive disorder), recurrent episode, mild (HCC)  F33.0     2. GAD (generalized anxiety disorder)  F41.1     3. Insomnia, unspecified type  G47.00       Past Psychiatric History: Please see initial evaluation for full  details. I have reviewed the history. No updates at this time.     Past Medical History:  Past Medical History:  Diagnosis Date   Absolute anemia 06/23/2018   Added automatically from request for surgery [id number]   Anemia    Anxiety    Backache 12/24/2007   Chronic back pain    Depression    Diabetes mellitus, type II (HCC)    Headache(784.0)    Hypertension    Metabolic syndrome X 04/12/2014   Obesity    Palpitation    Sickle cell trait (HCC)    Thyromegaly    Unspecified vitamin D  deficiency 02/19/2013    Past Surgical History:  Procedure Laterality Date   COLONOSCOPY WITH PROPOFOL  N/A 01/03/2021   Procedure: COLONOSCOPY WITH PROPOFOL ;  Surgeon: Eartha Angelia Sieving, MD;  Location: [facility];  Service: Gastroenterology;  Laterality: N/A;  AM   DILATATION AND CURETTAGE/HYSTEROSCOPY WITH MINERVA N/A 03/25/2019   Procedure: DILATATION AND CURETTAGE/HYSTEROSCOPY WITH  ATTEMPTED MINERVA ENDOMETRIAL ABLATION;  Surgeon: Jayne Vonn DEL, MD;  Location: [facility] ORS;  Service: Gynecology;  Laterality: N/A;   None      Family Psychiatric History: Please see initial evaluation for full details. I have reviewed the history. No updates at this time.     Family History:  Family History  Problem Relation Age of Onset   Diabetes Mother    Hypertension Mother    Stroke Mother    Colon cancer Mother  Alcohol abuse Mother    Cancer - Colon Mother 28   Pneumonia Father    Depression Sister    Hypertension Sister    Anxiety disorder Sister    Hypertension Sister    Leukemia Sister    Heart attack Maternal Uncle    Hypertension Maternal Grandfather    Diabetes Maternal Grandmother    Hypertension Maternal Grandmother    Depression Child     Social History:  Social History   Socioeconomic History   Marital status: Single    Spouse name: Not on file   Number of children: 2   Years of education: Not on file   Highest education level: Not on file  Occupational History    Occupation: employed in medical office   Tobacco Use   Smoking status: Never   Smokeless tobacco: Never  Vaping Use   Vaping status: Never Used  Substance and Sexual Activity   Alcohol use: No   Drug use: No   Sexual activity: Yes    Partners: Male    Birth control/protection: Other-see comments    Comment: bf had vasectomy  Other Topics Concern   Not on file  Social History Narrative   Not on file   Social Drivers of Health   Financial Resource Strain: Low Risk  (07/01/2023)   Overall Financial Resource Strain (CARDIA)    Difficulty of Paying Living Expenses: Not very hard  Food Insecurity: No Food Insecurity (07/01/2023)   Hunger Vital Sign    Worried About Running Out of Food in the Last Year: Never true    Ran Out of Food in the Last Year: Never true  Transportation Needs: No Transportation Needs (07/01/2023)   PRAPARE - Administrator, Civil Service (Medical): No    Lack of Transportation (Non-Medical): No  Physical Activity: Inactive (07/01/2023)   Exercise Vital Sign    Days of Exercise per Week: 0 days    Minutes of Exercise per Session: 0 min  Stress: Stress Concern Present (07/01/2023)   [employer] of Occupational Health - Occupational Stress Questionnaire    Feeling of Stress : To some extent  Social Connections: Moderately Integrated (07/01/2023)   Social Connection and Isolation Panel    Frequency of Communication with Friends and Family: Twice a week    Frequency of Social Gatherings with Friends and Family: Once a week    Attends Religious Services: 1 to 4 times per year    Active Member of Clubs or Organizations: No    Attends Engineer, structural: Never    Marital Status: Living with partner    Allergies: No Known Allergies  Metabolic Disorder Labs: Lab Results  Component Value Date   HGBA1C 12.0 (H) 03/20/2024   MPG 174.29 04/10/2022   MPG 240.3 08/25/2021   No results found for: PROLACTIN Lab Results  Component Value  Date   CHOL 137 11/05/2023   TRIG 93 11/05/2023   HDL 36 (L) 11/05/2023   CHOLHDL 5.3 (H) 06/06/2023   VLDL 11 02/05/2022   LDLCALC 83 11/05/2023   LDLCALC 87 06/06/2023   Lab Results  Component Value Date   TSH 2.140 11/05/2023   TSH 0.924 10/17/2022    Therapeutic Level Labs: No results found for: LITHIUM No results found for: VALPROATE No results found for: CBMZ  Current Medications: Current Outpatient Medications  Medication Sig Dispense Refill   Brexpiprazole  (REXULTI ) 0.5 MG TABS Take 1 tablet (0.5 mg total) by mouth daily. Total of 1.5  mg daily. Take along with 1 mg tab 30 tablet 1   brexpiprazole  (REXULTI ) 1 MG TABS tablet Take 1 tablet (1 mg total) by mouth daily. 30 tablet 1   acetaminophen  (TYLENOL ) 500 MG tablet Take 1,000 mg by mouth every 6 (six) hours as needed (pain).     aspirin  EC 81 MG tablet Take 1 tablet (81 mg total) by mouth daily. 90 tablet 3   blood glucose meter kit and supplies Dispense based on patient and insurance preference. Once daily testing dx e11.9 1 each 0   Blood Pressure KIT Use to check blood pressure daily. 1 kit 0   [START ON 04/21/2024] buPROPion  (WELLBUTRIN  XL) 150 MG 24 hr tablet Take 3 tablets (450 mg total) by mouth daily. 270 tablet 0   empagliflozin  (JARDIANCE ) 25 MG TABS tablet Take 1 tablet (25 mg total) by mouth daily before breakfast. 90 tablet 3   fluticasone  (FLONASE ) 50 MCG/ACT nasal spray Place 2 sprays into both nostrils daily. 16 g 6   glipiZIDE  (GLUCOTROL  XL) 10 MG 24 hr tablet Take 2 tablets (20 mg total) by mouth in the morning with breakfast. 180 tablet 1   hydrOXYzine  (ATARAX ) 25 MG tablet Take 1 tablet (25 mg total) by mouth daily as needed for anxiety (anxiety). 30 tablet 1   megestrol  (MEGACE ) 40 MG tablet Take 1 tablet (40 mg total) by mouth daily. 30 tablet 11   metoprolol  tartrate (LOPRESSOR ) 25 MG tablet Take 1 tablet (25 mg total) by mouth 2 (two) times daily. Please schedule appointment for future refills.  Thank you 180 tablet 1   montelukast  (SINGULAIR ) 10 MG tablet Take 1 tablet (10 mg total) by mouth at bedtime. 30 tablet 3   olmesartan  (BENICAR ) 20 MG tablet Take 1 tablet (20 mg total) by mouth daily. 90 tablet 1   ondansetron  (ZOFRAN ) 4 MG tablet Take one tablet by mouth once daily as needed, for nausea 36 tablet 1   rosuvastatin  (CRESTOR ) 10 MG tablet Take 1 tablet (10 mg total) by mouth daily. 90 tablet 1   tirzepatide  (MOUNJARO ) 7.5 MG/0.5ML Pen Inject 7.5 mg into the skin once a week. 6 mL 1   triamterene -hydrochlorothiazide  (MAXZIDE -25) 37.5-25 MG tablet Take 1 tablet by mouth daily. 90 tablet 3   venlafaxine  XR (EFFEXOR -XR) 150 MG 24 hr capsule Take 1 capsule (150 mg total) by mouth daily. 90 capsule 1   venlafaxine  XR (EFFEXOR -XR) 75 MG 24 hr capsule Take 1 capsule (75 mg total) by mouth daily. Total of 225 mg daily. Take along with 150 mg cap 90 capsule 1   Vitamin D , Ergocalciferol , (DRISDOL ) 1.25 MG (50000 UNIT) CAPS capsule Take 1 capsule (50,000 Units total) by mouth every 7 (seven) days. 12 capsule 1   zaleplon  (SONATA ) 10 MG capsule Take 1 capsule (10 mg total) by mouth at bedtime as needed for sleep. 30 capsule 1   zaleplon  (SONATA ) 10 MG capsule Take 1 capsule (10 mg total) by mouth at bedtime as needed for sleep. 30 capsule 1   No current facility-administered medications for this visit.     Musculoskeletal: Strength & Muscle Tone: N/A Gait & Station: N/A Patient leans: N/A  Psychiatric Specialty Exam: Review of Systems  Psychiatric/Behavioral:  Positive for dysphoric mood. Negative for agitation, behavioral problems, confusion, decreased concentration, hallucinations, self-injury, sleep disturbance and suicidal ideas. The patient is not nervous/anxious and is not hyperactive.   All other systems reviewed and are negative.   Last menstrual period 01/18/2019.There is no height  or weight on file to calculate BMI.  General Appearance: Well Groomed  Eye Contact:  Good   Speech:  Clear and Coherent  Volume:  Normal  Mood:  overwhelmed  Affect:  Appropriate, Congruent, and tense  Thought Process:  Coherent  Orientation:  Full (Time, Place, and Person)  Thought Content: Logical   Suicidal Thoughts:  No  Homicidal Thoughts:  No  Memory:  Immediate;   Good  Judgement:  Good  Insight:  Good  Psychomotor Activity:  Normal  Concentration:  Concentration: Good and Attention Span: Good  Recall:  Good  Fund of Knowledge: Good  Language: Good  Akathisia:  No  Handed:  Right  AIMS (if indicated): not done  Assets:  Communication Skills Desire for Improvement  ADL's:  Intact  Cognition: WNL  Sleep:  Fair   Screenings: GAD-7    Flowsheet Row Office Visit from 03/20/2024 in [facility] Office Visit from 11/05/2023 in [facility] Office Visit from 07/01/2023 in [facility] at [facility] Office Visit from 06/06/2023 in [facility] Office Visit from 02/06/2023 in [facility]  Total GAD-7 Score 6 15 4 9 6    PHQ2-9    Flowsheet Row Office Visit from 03/20/2024 in [facility] Office Visit from 11/05/2023 in [facility] Office Visit from 07/01/2023 in [facility] at [facility] Office Visit from 06/06/2023 in [facility] Office Visit from 02/06/2023 in [facility]  PHQ-2 Total Score 2 4 2  0 2  PHQ-9 Total Score 7 15 6  -- 9   Flowsheet Row Video Visit from 09/14/2021 in [facility] Video Visit from 08[facility]10/2021 in [facility] Video Visit from 05/04/2021 in [facility]  C-SSRS RISK CATEGORY Error: Q3, 4, or 5 should not be populated when Q2 is No No Risk Low Risk     Assessment and Plan:  Mary Roman  is a 53 y.o. year old female with a history of depression, mild OSA, diabetes, who presents for follow up appointment for below.   1. MDD (major depressive disorder), recurrent episode, mild (HCC) 2. GAD (generalized anxiety disorder) Acute stressors include: loss of her mother in Feb 2025, her fiancee with pending knee replacement due to diabetes, work related stress due to shortage of staff ,conflict with one of her sister  Other stressors include: financial strain, difficulty in communication with her daughter, conflict with her siblings   History:   She reports worsening in depressive symptoms in the context of work-related stress. She also finds herself to be more irritable, although anxiety has been relatively manageable. These symptoms have been interfering her function at work.   Will uptitrate rexulti  to optimize treatment for depression and anxiety.  Discussed potential metabolic side effects, EPS and QTc prolongation.  Will continue venlafaxine  to target depression and anxiety.  Will continue bupropion  as adjunctive treatment for depression, and hydroxyzine  as needed for anxiety.   3. Insomnia, unspecified type - per chart, she was recommended to try CPAP; will reconnect with the provider.  She had to reschedule the cardiology appointment due to work schedule.  Will continue current dose of Sonata  to target insomnia.   # high risk medication use    Last checked  EKG HR 86, QTc464msec 04/2021  Lipid panels  LDL 83 10/2023  HbA1c 12.6 H 10/2023     Plan (she will contact the office if she needs a refill)- [place] Continue venlafaxine  225 mg daily  Continue bupropion  450 mg daily  Increase rexulti  1.5 mg daily  Continue Sonata  10 mg at night as needed for insomnia- [facility] Continue hydroxyzine  25 mg daily as needed for anxiety- [facility]. She rarely takes this medication Next appointment: 10/31 at 9 am, video - on Mounjaro    Past trials of medication: fluoxetine , duloxetine ,  Abilify  (weight gain),  buspirone , Trazodone  (weird feeling) temazepam , Ambien , Lunesta , Xanax       The patient demonstrates the following risk factors for suicide: Chronic risk factors for suicide include: psychiatric disorder of depression, anxiety and chronic pain. Acute risk factors for suicide include: N/A. Protective factors for this patient include: positive social support, responsibility to others (children, family), coping skills and hope for the future. Considering these factors, the overall suicide risk at this point appears to be low. Patient is appropriate for outpatient follow up.    Collaboration of Care: Collaboration of Care: Other reviewed notes in Epic  Patient/Guardian was advised Release of Information must be obtained prior to any record release in order to collaborate their care with an outside provider. Patient/Guardian was advised if they have not already done so to contact the registration department to sign all necessary forms in order for us  to release information regarding their care.   Consent: Patient/Guardian gives verbal consent for treatment and assignment of benefits for services provided during this visit. Patient/Guardian expressed understanding and agreed to proceed.    Katheren Sleet, MD 04/15/2024, 3:33 PM

## 2024-04-13 ENCOUNTER — Other Ambulatory Visit (HOSPITAL_COMMUNITY): Payer: Self-pay

## 2024-04-14 ENCOUNTER — Other Ambulatory Visit: Payer: Self-pay

## 2024-04-14 ENCOUNTER — Other Ambulatory Visit: Payer: Self-pay | Admitting: Family Medicine

## 2024-04-14 ENCOUNTER — Other Ambulatory Visit (HOSPITAL_COMMUNITY): Payer: Self-pay

## 2024-04-14 ENCOUNTER — Other Ambulatory Visit: Payer: Self-pay | Admitting: Psychiatry

## 2024-04-14 MED ORDER — METOPROLOL TARTRATE 25 MG PO TABS
25.0000 mg | ORAL_TABLET | Freq: Two times a day (BID) | ORAL | 1 refills | Status: AC
  Filled 2024-04-14: qty 180, 90d supply, fill #0
  Filled 2024-07-28: qty 180, 90d supply, fill #1

## 2024-04-14 MED ORDER — BUPROPION HCL ER (XL) 150 MG PO TB24
450.0000 mg | ORAL_TABLET | Freq: Every day | ORAL | 0 refills | Status: DC
  Filled 2024-04-14: qty 270, 90d supply, fill #0

## 2024-04-15 ENCOUNTER — Telehealth: Admitting: Psychiatry

## 2024-04-15 ENCOUNTER — Other Ambulatory Visit (HOSPITAL_COMMUNITY): Payer: Self-pay

## 2024-04-15 ENCOUNTER — Encounter: Payer: Self-pay | Admitting: Psychiatry

## 2024-04-15 DIAGNOSIS — F411 Generalized anxiety disorder: Secondary | ICD-10-CM | POA: Diagnosis not present

## 2024-04-15 DIAGNOSIS — G4700 Insomnia, unspecified: Secondary | ICD-10-CM | POA: Diagnosis not present

## 2024-04-15 DIAGNOSIS — F330 Major depressive disorder, recurrent, mild: Secondary | ICD-10-CM | POA: Diagnosis not present

## 2024-04-15 MED ORDER — REXULTI 0.5 MG PO TABS
0.5000 mg | ORAL_TABLET | Freq: Every day | ORAL | 1 refills | Status: DC
  Filled 2024-04-15: qty 30, 30d supply, fill #0
  Filled 2024-05-19: qty 30, 30d supply, fill #1

## 2024-04-15 MED ORDER — BREXPIPRAZOLE 1 MG PO TABS
1.0000 mg | ORAL_TABLET | Freq: Every day | ORAL | 1 refills | Status: DC
  Filled 2024-04-15: qty 30, 30d supply, fill #0
  Filled 2024-05-19: qty 30, 30d supply, fill #1

## 2024-04-15 NOTE — Patient Instructions (Addendum)
 Continue venlafaxine  225 mg daily  Continue bupropion  450 mg daily  Increase rexulti  1.5 mg daily  Continue Sonata  10 mg at night as needed for insomnia Continue hydroxyzine  25 mg daily as needed for anxiety Next appointment: 10/31 at 9 am, video

## 2024-04-16 ENCOUNTER — Other Ambulatory Visit: Payer: Self-pay | Admitting: Psychiatry

## 2024-04-17 ENCOUNTER — Ambulatory Visit: Admitting: Cardiology

## 2024-04-20 ENCOUNTER — Encounter: Attending: Family Medicine | Admitting: Nutrition

## 2024-04-20 VITALS — Ht 65.0 in | Wt 186.0 lb

## 2024-04-20 DIAGNOSIS — E785 Hyperlipidemia, unspecified: Secondary | ICD-10-CM | POA: Insufficient documentation

## 2024-04-20 DIAGNOSIS — I10 Essential (primary) hypertension: Secondary | ICD-10-CM | POA: Insufficient documentation

## 2024-04-20 DIAGNOSIS — E1159 Type 2 diabetes mellitus with other circulatory complications: Secondary | ICD-10-CM | POA: Insufficient documentation

## 2024-04-20 NOTE — Progress Notes (Unsigned)
 Medical Nutrition Therapy  Appointment Start time:  1315  Appointment End time:  1415  Primary concerns today: Type 2 Dm  Referral diagnosis: E11.8 Preferred learning style: No preference  Learning readiness: Ready    NUTRITION ASSESSMENT [employer] Employee [phone] 53 yr old bfemale referred fro Uncontrolled Type 2 DM. A1C 12.% PCP Dr. Antonetta  Is not currently seeing an endocrinologist. Has depression and anxiety and she admits to emotional eating. Currently on Wellbutrin  and Effexor  for anxiety and depression. Use to see [facility] but not currently.  When depressed doesn't take her Mounjaro .. gets nauseas at times when taking Monjero. Meals inconsistent due to GI side effects for GLP 1 medication. Dr. Gerry.  Meets once a month.  Use to see Peggy with [facility]. Prefers to get back to seeing a therapist/counselor to help with her depression and anxiety  Is on Glipizide  and Jardiance  for her DM. Not on insulin.   Clinical Medical Hx:  Past Medical History:  Diagnosis Date   Absolute anemia 06/23/2018   Added automatically from request for surgery [id number]   Anemia    Anxiety    Backache 12/24/2007   Chronic back pain    Depression    Diabetes mellitus, type II (HCC)    Headache(784.0)    Hypertension    Metabolic syndrome X 04/12/2014   Obesity    Palpitation    Sickle cell trait (HCC)    Thyromegaly    Unspecified vitamin D  deficiency 02/19/2013  ; Medications:  Current Outpatient Medications on File Prior to Visit  Medication Sig Dispense Refill   Brexpiprazole  (REXULTI ) 0.5 MG TABS Take 1 tablet (0.5 mg total) by mouth daily. Total of 1.5 mg daily. Take along with 1 mg tab 30 tablet 1   [START ON 04/21/2024] buPROPion  (WELLBUTRIN  XL) 150 MG 24 hr tablet Take 3 tablets (450 mg total) by mouth daily. 270 tablet 0   empagliflozin  (JARDIANCE ) 25 MG TABS tablet Take 1 tablet (25 mg total) by mouth daily before breakfast. 90 tablet 3   glipiZIDE  (GLUCOTROL  XL) 10 MG 24 hr tablet  Take 2 tablets (20 mg total) by mouth in the morning with breakfast. 180 tablet 1   hydrOXYzine  (ATARAX ) 25 MG tablet Take 1 tablet (25 mg total) by mouth daily as needed for anxiety (anxiety). 30 tablet 1   megestrol  (MEGACE ) 40 MG tablet Take 1 tablet (40 mg total) by mouth daily. 30 tablet 11   metoprolol  tartrate (LOPRESSOR ) 25 MG tablet Take 1 tablet (25 mg total) by mouth 2 (two) times daily. Please schedule appointment for future refills. Thank you 180 tablet 1   olmesartan  (BENICAR ) 20 MG tablet Take 1 tablet (20 mg total) by mouth daily. 90 tablet 1   ondansetron  (ZOFRAN ) 4 MG tablet Take one tablet by mouth once daily as needed, for nausea 36 tablet 1   rosuvastatin  (CRESTOR ) 10 MG tablet Take 1 tablet (10 mg total) by mouth daily. 90 tablet 1   tirzepatide  (MOUNJARO ) 7.5 MG/0.5ML Pen Inject 7.5 mg into the skin once a week. 6 mL 1   triamterene -hydrochlorothiazide  (MAXZIDE -25) 37.5-25 MG tablet Take 1 tablet by mouth daily. 90 tablet 3   venlafaxine  XR (EFFEXOR -XR) 150 MG 24 hr capsule Take 1 capsule (150 mg total) by mouth daily. 90 capsule 1   venlafaxine  XR (EFFEXOR -XR) 75 MG 24 hr capsule Take 1 capsule (75 mg total) by mouth daily. Total of 225 mg daily. Take along with 150 mg cap 90 capsule 1   Vitamin  D, Ergocalciferol , (DRISDOL ) 1.25 MG (50000 UNIT) CAPS capsule Take 1 capsule (50,000 Units total) by mouth every 7 (seven) days. 12 capsule 1   zaleplon  (SONATA ) 10 MG capsule Take 1 capsule (10 mg total) by mouth at bedtime as needed for sleep. 30 capsule 1   zaleplon  (SONATA ) 10 MG capsule Take 1 capsule (10 mg total) by mouth at bedtime as needed for sleep. 30 capsule 1   acetaminophen  (TYLENOL ) 500 MG tablet Take 1,000 mg by mouth every 6 (six) hours as needed (pain). (Patient not taking: Reported on 04/20/2024)     aspirin  EC 81 MG tablet Take 1 tablet (81 mg total) by mouth daily. (Patient not taking: Reported on 04/20/2024) 90 tablet 3   blood glucose meter kit and supplies  Dispense based on patient and insurance preference. Once daily testing dx e11.9 1 each 0   Blood Pressure KIT Use to check blood pressure daily. 1 kit 0   brexpiprazole  (REXULTI ) 1 MG TABS tablet Take 1 tablet (1 mg total) by mouth daily. 30 tablet 1   fluticasone  (FLONASE ) 50 MCG/ACT nasal spray Place 2 sprays into both nostrils daily. (Patient not taking: Reported on 04/20/2024) 16 g 6   montelukast  (SINGULAIR ) 10 MG tablet Take 1 tablet (10 mg total) by mouth at bedtime. (Patient not taking: Reported on 04/20/2024) 30 tablet 3   [DISCONTINUED] ferrous sulfate 325 (65 FE) MG EC tablet Take 325 mg by mouth 3 (three) times daily with meals.     No current facility-administered medications on file prior to visit.    Labs:  Lab Results  Component Value Date   HGBA1C 12.0 (H) 03/20/2024      Latest Ref Rng & Units 03/20/2024    3:30 PM 11/05/2023    3:49 PM 06/06/2023    9:00 AM  CMP  Glucose 70 - 99 mg/dL 794  871  521   BUN 6 - 24 mg/dL 8  6  9    Creatinine 0.57 - 1.00 mg/dL 8.99  8.93  9.00   Sodium 134 - 144 mmol/L 139  142  137   Potassium 3.5 - 5.2 mmol/L 3.9  4.0  3.9   Chloride 96 - 106 mmol/L 101  104  100   CO2 20 - 29 mmol/L 19  23  21    Calcium  8.7 - 10.2 mg/dL 89.8  89.1  9.0   Total Protein 6.0 - 8.5 g/dL  7.7  6.5   Total Bilirubin 0.0 - 1.2 mg/dL  0.5  0.4   Alkaline Phos 44 - 121 IU/L  96  103   AST 0 - 40 IU/L  14  16   ALT 0 - 32 IU/L  15  17    Lipid Panel     Component Value Date/Time   CHOL 137 11/05/2023 1549   TRIG 93 11/05/2023 1549   HDL 36 (L) 11/05/2023 1549   CHOLHDL 5.3 (H) 06/06/2023 0900   CHOLHDL 3.8 02/05/2022 0832   VLDL 11 02/05/2022 0832   LDLCALC 83 11/05/2023 1549   LDLCALC 96 03/06/2018 0928   LABVLDL 18 11/05/2023 1549    Notable Signs/Symptoms: ***  Lifestyle & Dietary Hx Lives with her fiance and 2 kids. 21, 17. Eats 1-2 meal per day  Estimated daily fluid intake: no much water  30 oz, sweet tea, gingerale-regular Supplements:  Vit D Sleep: has issues  5 hrs Stress / self-care: work and health issues Current average weekly physical activity: ADL  24-Hr Dietary Recall  Eats 1-2 meals  Doesn't eat during the day and then eats 1 meal at night; pizza or chick fila salad,   Estimated Energy Needs Calories: 1500 Carbohydrate: 170g Protein: 112g Fat: 42g   NUTRITION DIAGNOSIS  NB-1.1 Food and nutrition-related knowledge deficit As related to Inconsistent diet to meet her nutritional needs to control blood sugars.  As evidenced by A1C 12% and eating meals randomly .   NUTRITION INTERVENTION  Nutrition education (E-1) on the following topics:  ***  Handouts Provided Include  ***  Learning Style & Readiness for Change Teaching method utilized: Visual & Auditory  Demonstrated degree of understanding via: Teach Back  Barriers to learning/adherence to lifestyle change: ***  Goals Established by Pt ***   MONITORING & EVALUATION Dietary intake, weekly physical activity, and *** in ***.  Next Steps  Patient is to ***.

## 2024-04-29 ENCOUNTER — Encounter: Payer: Self-pay | Admitting: Nutrition

## 2024-04-29 NOTE — Patient Instructions (Signed)
 Eat three balanced meals per day Take Metformin  after meal and not before. Increase water  to 80 oz per day Do not skip meals Take Mounjaro  on weekends so it doesn't make you sick during the week. You have to eat 3 meals per day for the medication to work. Check blood sugars before breakfast and bedtime or during the day if feel odd or different. Goal is to get A1C down to 7%.

## 2024-05-04 ENCOUNTER — Encounter: Payer: Self-pay | Admitting: Family Medicine

## 2024-05-04 ENCOUNTER — Other Ambulatory Visit: Payer: Self-pay | Admitting: Internal Medicine

## 2024-05-04 DIAGNOSIS — E1165 Type 2 diabetes mellitus with hyperglycemia: Secondary | ICD-10-CM

## 2024-05-04 MED ORDER — FREESTYLE LIBRE 3 PLUS SENSOR MISC: 3 refills | Status: AC

## 2024-05-07 ENCOUNTER — Other Ambulatory Visit: Payer: Self-pay | Admitting: Family Medicine

## 2024-05-07 ENCOUNTER — Other Ambulatory Visit (HOSPITAL_COMMUNITY): Payer: Self-pay

## 2024-05-07 MED ORDER — EMPAGLIFLOZIN 25 MG PO TABS
25.0000 mg | ORAL_TABLET | Freq: Every day | ORAL | 3 refills | Status: AC
  Filled 2024-05-07: qty 90, 90d supply, fill #0
  Filled 2024-08-03: qty 90, 90d supply, fill #1
  Filled 2024-08-03: qty 90, 90d supply, fill #0

## 2024-05-19 ENCOUNTER — Other Ambulatory Visit: Payer: Self-pay

## 2024-05-19 ENCOUNTER — Other Ambulatory Visit (HOSPITAL_COMMUNITY): Payer: Self-pay

## 2024-05-24 NOTE — Progress Notes (Unsigned)
 Virtual Visit via Video Note  I connected with Mary Roman on 05/29/24 at  9:00 AM EDT by a video enabled telemedicine application and verified that I am speaking with the correct person using two identifiers.  Location: Patient: work Provider: home office Persons participated in the visit- patient, provider    I discussed the limitations of evaluation and management by telemedicine and the availability of in person appointments. The patient expressed understanding and agreed to proceed.     I discussed the assessment and treatment plan with the patient. The patient was provided an opportunity to ask questions and all were answered. The patient agreed with the plan and demonstrated an understanding of the instructions.   The patient was advised to call back or seek an in-person evaluation if the symptoms worsen or if the condition fails to improve as anticipated.    Katheren Sleet, MD    [facility] MD/PA/NP OP Progress Note  05/29/2024 9:29 AM Mary Roman  MRN:  [id number]  Chief Complaint:  Chief Complaint  Patient presents with   Follow-up   HPI:  This is a follow-up appointment for depression, anxiety and insomnia.  She states that she has been feeling more relaxed since uptitration of rexulti .  She is not as anxious anymore.  Although the situation at work has not been changed, she has been able to handle things well.  Things are helpful for her daughter, who is interested in going to college.  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  Her fianc tries to get knee replacement, although he has not been able to do so due to diabetes.  He is concerned to apply for disability.  He takes care of things at house which is a huge help for her.  He states that the brother, who lived with his mother steps out and has been helping for the things at the house.  He feels good to maintain boundary with her sister, who lives locally.  She is looking  forward to see her sister in [place] for Thanksgiving.  She sleeps better.  She is now able to sleep up to 7 hours without middle insomnia.  Although she feels down few times a week, it has been getting better.  Psychoeducation was provided regarding mindfulness.  She has occasional binge eating when she feels Mounjaro  is wearing off.  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  She denies SI, HI, hallucinations.  She denies alcohol use or drug use.  She agrees with the plans as outlined below.   Wt Readings from Last 3 Encounters:  04/20/24 186 lb (84.4 kg)  03/20/24 188 lb 0.6 oz (85.3 kg)  11/05/23 188 lb 1.3 oz (85.3 kg)     Visit Diagnosis:    ICD-10-CM   1. MDD (major depressive disorder), recurrent episode, mild  F33.0     2. Insomnia, unspecified type  G47.00 zaleplon  (SONATA ) 10 MG capsule    3. GAD (generalized anxiety disorder)  F41.1       Past Psychiatric History: Please see initial evaluation for full details. I have reviewed the history. No updates at this time.     Past Medical History:  Past Medical History:  Diagnosis Date   Absolute anemia 06/23/2018   Added automatically from request for surgery [id number]   Anemia    Anxiety    Backache 12/24/2007   Chronic back pain    Depression    Diabetes mellitus,  type II (HCC)    Headache(784.0)    Hypertension    Metabolic syndrome X 04/12/2014   Obesity    Palpitation    Sickle cell trait    Thyromegaly    Unspecified vitamin D  deficiency 02/19/2013    Past Surgical History:  Procedure Laterality Date   COLONOSCOPY WITH PROPOFOL  N/A 01/03/2021   Procedure: COLONOSCOPY WITH PROPOFOL ;  Surgeon: Eartha Angelia Sieving, MD;  Location: [facility];  Service: Gastroenterology;  Laterality: N/A;  AM   DILATATION AND CURETTAGE/HYSTEROSCOPY WITH MINERVA N/A 03/25/2019   Procedure: DILATATION AND CURETTAGE/HYSTEROSCOPY WITH  ATTEMPTED MINERVA ENDOMETRIAL ABLATION;  Surgeon: Jayne Vonn DEL, MD;  Location: [facility]  ORS;  Service: Gynecology;  Laterality: N/A;   None      Family Psychiatric History: Please see initial evaluation for full details. I have reviewed the history. No updates at this time.     Family History:  Family History  Problem Relation Age of Onset   Diabetes Mother    Hypertension Mother    Stroke Mother    Colon cancer Mother    Alcohol abuse Mother    Cancer - Colon Mother 81   Pneumonia Father    Depression Sister    Hypertension Sister    Anxiety disorder Sister    Hypertension Sister    Leukemia Sister    Heart attack Maternal Uncle    Hypertension Maternal Grandfather    Diabetes Maternal Grandmother    Hypertension Maternal Grandmother    Depression Child     Social History:  Social History   Socioeconomic History   Marital status: Single    Spouse name: Not on file   Number of children: 2   Years of education: Not on file   Highest education level: Not on file  Occupational History   Occupation: employed in medical office   Tobacco Use   Smoking status: Never   Smokeless tobacco: Never  Vaping Use   Vaping status: Never Used  Substance and Sexual Activity   Alcohol use: No   Drug use: No   Sexual activity: Yes    Partners: Male    Birth control/protection: Other-see comments    Comment: bf had vasectomy  Other Topics Concern   Not on file  Social History Narrative   Not on file   Social Drivers of Health   Financial Resource Strain: Low Risk  (07/01/2023)   Overall Financial Resource Strain (CARDIA)    Difficulty of Paying Living Expenses: Not very hard  Food Insecurity: No Food Insecurity (07/01/2023)   Hunger Vital Sign    Worried About Running Out of Food in the Last Year: Never true    Ran Out of Food in the Last Year: Never true  Transportation Needs: No Transportation Needs (07/01/2023)   PRAPARE - Administrator, Civil Service (Medical): No    Lack of Transportation (Non-Medical): No  Physical Activity: Inactive  (07/01/2023)   Exercise Vital Sign    Days of Exercise per Week: 0 days    Minutes of Exercise per Session: 0 min  Stress: Stress Concern Present (07/01/2023)   [employer] of Occupational Health - Occupational Stress Questionnaire    Feeling of Stress : To some extent  Social Connections: Moderately Integrated (07/01/2023)   Social Connection and Isolation Panel    Frequency of Communication with Friends and Family: Twice a week    Frequency of Social Gatherings with Friends and Family: Once a week  Attends Religious Services: 1 to 4 times per year    Active Member of Clubs or Organizations: No    Attends Banker Meetings: Never    Marital Status: Living with partner    Allergies: No Known Allergies  Metabolic Disorder Labs: Lab Results  Component Value Date   HGBA1C 12.0 (H) 03/20/2024   MPG 174.29 04/10/2022   MPG 240.3 08/25/2021   No results found for: PROLACTIN Lab Results  Component Value Date   CHOL 137 11/05/2023   TRIG 93 11/05/2023   HDL 36 (L) 11/05/2023   CHOLHDL 5.3 (H) 06/06/2023   VLDL 11 02/05/2022   LDLCALC 83 11/05/2023   LDLCALC 87 06/06/2023   Lab Results  Component Value Date   TSH 2.140 11/05/2023   TSH 0.924 10/17/2022    Therapeutic Level Labs: No results found for: LITHIUM No results found for: VALPROATE No results found for: CBMZ  Current Medications: Current Outpatient Medications  Medication Sig Dispense Refill   acetaminophen  (TYLENOL ) 500 MG tablet Take 1,000 mg by mouth every 6 (six) hours as needed (pain). (Patient not taking: Reported on 04/20/2024)     aspirin  EC 81 MG tablet Take 1 tablet (81 mg total) by mouth daily. (Patient not taking: Reported on 04/20/2024) 90 tablet 3   blood glucose meter kit and supplies Dispense based on patient and insurance preference. Once daily testing dx e11.9 1 each 0   Blood Pressure KIT Use to check blood pressure daily. 1 kit 0   [START ON 06/19/2024] Brexpiprazole   (REXULTI ) 0.5 MG TABS Take 1 tablet (0.5 mg total) by mouth daily. Total of 1.5 mg daily. Take along with 1 mg tab 90 tablet 0   [START ON 06/19/2024] brexpiprazole  (REXULTI ) 1 MG TABS tablet Take 1 tablet (1 mg total) by mouth daily. 90 tablet 0   [START ON 07/20/2024] buPROPion  (WELLBUTRIN  XL) 150 MG 24 hr tablet Take 3 tablets (450 mg total) by mouth daily. 270 tablet 1   Continuous Glucose Sensor (FREESTYLE LIBRE 3 PLUS SENSOR) MISC Change sensor every 15 days. 6 each 3   empagliflozin  (JARDIANCE ) 25 MG TABS tablet Take 1 tablet (25 mg total) by mouth daily before breakfast. 90 tablet 3   fluticasone  (FLONASE ) 50 MCG/ACT nasal spray Place 2 sprays into both nostrils daily. (Patient not taking: Reported on 04/20/2024) 16 g 6   glipiZIDE  (GLUCOTROL  XL) 10 MG 24 hr tablet Take 2 tablets (20 mg total) by mouth in the morning with breakfast. 180 tablet 1   hydrOXYzine  (ATARAX ) 25 MG tablet Take 1 tablet (25 mg total) by mouth daily as needed for anxiety (anxiety). 30 tablet 1   metoprolol  tartrate (LOPRESSOR ) 25 MG tablet Take 1 tablet (25 mg total) by mouth 2 (two) times daily. Please schedule appointment for future refills. Thank you 180 tablet 1   montelukast  (SINGULAIR ) 10 MG tablet Take 1 tablet (10 mg total) by mouth at bedtime. (Patient not taking: Reported on 04/20/2024) 30 tablet 3   olmesartan  (BENICAR ) 20 MG tablet Take 1 tablet (20 mg total) by mouth daily. 90 tablet 1   ondansetron  (ZOFRAN ) 4 MG tablet Take one tablet by mouth once daily as needed, for nausea 36 tablet 1   rosuvastatin  (CRESTOR ) 10 MG tablet Take 1 tablet (10 mg total) by mouth daily. 90 tablet 1   tirzepatide  (MOUNJARO ) 7.5 MG/0.5ML Pen Inject 7.5 mg into the skin once a week. 6 mL 1   triamterene -hydrochlorothiazide  (MAXZIDE -25) 37.5-25 MG tablet Take 1 tablet  by mouth daily. 90 tablet 3   venlafaxine  XR (EFFEXOR -XR) 150 MG 24 hr capsule Take 1 capsule (150 mg total) by mouth daily. 90 capsule 1   venlafaxine  XR  (EFFEXOR -XR) 75 MG 24 hr capsule Take 1 capsule (75 mg total) by mouth daily. Total of 225 mg daily. Take along with 150 mg cap 90 capsule 1   Vitamin D , Ergocalciferol , (DRISDOL ) 1.25 MG (50000 UNIT) CAPS capsule Take 1 capsule (50,000 Units total) by mouth every 7 (seven) days. 12 capsule 1   zaleplon  (SONATA ) 10 MG capsule Take 1 capsule (10 mg total) by mouth at bedtime as needed for sleep. 30 capsule 1   [START ON 06/09/2024] zaleplon  (SONATA ) 10 MG capsule Take 1 capsule (10 mg total) by mouth at bedtime as needed for sleep. 30 capsule 1   No current facility-administered medications for this visit.     Musculoskeletal: Strength & Muscle Tone: N/A Gait & Station: N/A Patient leans: N/A  Psychiatric Specialty Exam: Review of Systems  Psychiatric/Behavioral:  Positive for dysphoric mood. Negative for agitation, behavioral problems, confusion, decreased concentration, hallucinations, self-injury, sleep disturbance and suicidal ideas. The patient is nervous/anxious. The patient is not hyperactive.   All other systems reviewed and are negative.   Last menstrual period 01/18/2019.There is no height or weight on file to calculate BMI.  General Appearance: Well Groomed  Eye Contact:  Good  Speech:  Clear and Coherent  Volume:  Normal  Mood:  better  Affect:  Appropriate, Congruent, and brighter  Thought Process:  Coherent  Orientation:  Full (Time, Place, and Person)  Thought Content: Logical   Suicidal Thoughts:  No  Homicidal Thoughts:  No  Memory:  Immediate;   Good  Judgement:  Good  Insight:  Good  Psychomotor Activity:  Normal  Concentration:  Concentration: Good and Attention Span: Good  Recall:  Good  Fund of Knowledge: Good  Language: Good  Akathisia:  No  Handed:  Right  AIMS (if indicated): not done  Assets:  Communication Skills Desire for Improvement  ADL's:  Intact  Cognition: WNL  Sleep:  Fair   Screenings: GAD-7    Flowsheet Row Office Visit from  03/20/2024 in [facility] Office Visit from 11/05/2023 in [facility] Office Visit from 07/01/2023 in [facility] Office Visit from 06/06/2023 in [facility] Office Visit from 02/06/2023 in [facility]  Total GAD-7 Score 6 15 4 9 6    PHQ2-9    Flowsheet Row Nutrition from 04/20/2024 in [facility] [place] Office Visit from 03/20/2024 in [facility] Office Visit from 11/05/2023 in [facility] Office Visit from 07/01/2023 in [facility] Office Visit from 06/06/2023 in [facility]  PHQ-2 Total Score 4 2 4 2  0  PHQ-9 Total Score 11 7 15 6  --   Flowsheet Row Video Visit from 09/14/2021 in [facility] Video Visit from 08/10/2021 in [facility] Video Visit from 05/04/2021 in [facility]  C-SSRS RISK CATEGORY Error: Q3, 4, or 5 should not be populated when Q2 is No No Risk Low Risk     Assessment and Plan:  Mary Roman is a 53 y.o. year old female with a history of depression, mild  OSA, diabetes, who presents for follow up appointment for below.   2. MDD (major depressive disorder), recurrent episode, mild 3. GAD (generalized anxiety disorder) She lost her mother in Feb 2025.  Her fianc is unemployed, who has not been able to undergo knee replacement due to diabetes.  She reports conflict with her one of her sister, who lives locally.  History:   The exam is notable for brighter affect, and she reports overall improvement in depression and anxiety since uptitration of rexulti .  Although she continues to have a work related stress, it has been relatively manageable.  She is helping her  daughter prepare for college.  Will maintain current medication regimen at this time.  Will continue venlafaxine  to target depression and anxiety.  Will continue bupropion  and rexulti  as adjunctive treatment for depression.  Although hydroxyzine  as needed it is available for anxiety, she has been able to limit its use.   1. Insomnia, unspecified type - per chart, she was recommended to try CPAP; rescheduled with cardiologist. Overall improvement, which coincided with uptitration of rexulti /improvement in her mood.  Will continue current dose of Sonata  to target insomnia.    # high risk medication use      Last checked  EKG HR 86, QTc438msec 04/2021  Lipid panels LDL 83 10/2023  HbA1c 12.6 H 10/2023      Plan (she will contact the office if she needs a refill)- [place] Continue venlafaxine  225 mg daily  Continue bupropion  450 mg daily  Continue rexulti  1.5 mg daily  Continue Sonata  10 mg at night as needed for insomnia- [facility] Continue hydroxyzine  25 mg daily as needed for anxiety- [facility]. She rarely takes this medication Next appointment: 1/5 at 8 am, video - on Mounjaro    Past trials of medication: fluoxetine , duloxetine , Abilify  (weight gain),  buspirone , Trazodone  (weird feeling) temazepam , Ambien , Lunesta , Xanax       The patient demonstrates the following risk factors for suicide: Chronic risk factors for suicide include: psychiatric disorder of depression, anxiety and chronic pain. Acute risk factors for suicide include: N/A. Protective factors for this patient include: positive social support, responsibility to others (children, family), coping skills and hope for the future. Considering these factors, the overall suicide risk at this point appears to be low. Patient is appropriate for outpatient follow up.      Collaboration of Care: Collaboration of Care: Other reviewed notes in Epic  Patient/Guardian was advised Release of Information must be obtained prior to any record  release in order to collaborate their care with an outside provider. Patient/Guardian was advised if they have not already done so to contact the registration department to sign all necessary forms in order for us  to release information regarding their care.   Consent: Patient/Guardian gives verbal consent for treatment and assignment of benefits for services provided during this visit. Patient/Guardian expressed understanding and agreed to proceed.    Katheren Sleet, MD 05/29/2024, 9:29 AM

## 2024-05-28 ENCOUNTER — Ambulatory Visit: Admitting: Nutrition

## 2024-05-29 ENCOUNTER — Telehealth: Admitting: Psychiatry

## 2024-05-29 ENCOUNTER — Other Ambulatory Visit (HOSPITAL_COMMUNITY): Payer: Self-pay

## 2024-05-29 ENCOUNTER — Encounter: Payer: Self-pay | Admitting: Psychiatry

## 2024-05-29 DIAGNOSIS — F411 Generalized anxiety disorder: Secondary | ICD-10-CM | POA: Diagnosis not present

## 2024-05-29 DIAGNOSIS — F330 Major depressive disorder, recurrent, mild: Secondary | ICD-10-CM

## 2024-05-29 DIAGNOSIS — G4700 Insomnia, unspecified: Secondary | ICD-10-CM | POA: Diagnosis not present

## 2024-05-29 MED ORDER — ZALEPLON 10 MG PO CAPS: 10.0000 mg | ORAL_CAPSULE | Freq: Every evening | ORAL | 1 refills | Status: DC | PRN

## 2024-05-29 MED ORDER — BUPROPION HCL ER (XL) 150 MG PO TB24: 450.0000 mg | ORAL_TABLET | Freq: Every day | ORAL | 1 refills | Status: AC

## 2024-05-29 MED ORDER — REXULTI 0.5 MG PO TABS
0.5000 mg | ORAL_TABLET | Freq: Every day | ORAL | 0 refills | Status: DC
  Filled 2024-06-19: qty 90, 90d supply, fill #0

## 2024-05-29 MED ORDER — BREXPIPRAZOLE 1 MG PO TABS
1.0000 mg | ORAL_TABLET | Freq: Every day | ORAL | 0 refills | Status: DC
  Filled 2024-06-19: qty 90, 90d supply, fill #0

## 2024-05-29 NOTE — Patient Instructions (Signed)
 Continue venlafaxine  225 mg daily  Continue bupropion  450 mg daily  Continue rexulti  1.5 mg daily  Continue Sonata  10 mg at night as needed for insomnia Continue hydroxyzine  25 mg daily as needed for anxiety Next appointment: 1/5 at 8 am

## 2024-06-09 ENCOUNTER — Other Ambulatory Visit (HOSPITAL_COMMUNITY): Payer: Self-pay

## 2024-06-09 ENCOUNTER — Encounter: Payer: Self-pay | Admitting: Family Medicine

## 2024-06-10 ENCOUNTER — Other Ambulatory Visit: Payer: Self-pay

## 2024-06-10 ENCOUNTER — Telehealth: Payer: Self-pay

## 2024-06-10 MED ORDER — TIRZEPATIDE 10 MG/0.5ML ~~LOC~~ SOAJ
10.0000 mg | SUBCUTANEOUS | 0 refills | Status: AC
  Filled 2024-06-22: qty 2, 28d supply, fill #0
  Filled 2024-07-28: qty 6, 84d supply, fill #0
  Filled 2024-08-03: qty 2, 28d supply, fill #0
  Filled 2024-08-26: qty 6, 84d supply, fill #0
  Filled 2024-08-31: qty 2, 28d supply, fill #0
  Filled ????-??-??: fill #0

## 2024-06-10 NOTE — Telephone Encounter (Signed)
 Per dr antonetta, 10mg  would be sent it. Med sent to [facility] in [place] as pt requests

## 2024-06-10 NOTE — Telephone Encounter (Signed)
 Copied from CRM [id number]. Topic: Clinical - Medication Question >> Jun 10, 2024  8:35 AM Victoria A wrote: Reason for CRM: Patient is following up on dosage increase of tirzepatide  (MOUNJARO ) 7.5 MG/0.5ML Pen to 10mg -please contact patient

## 2024-06-15 ENCOUNTER — Encounter: Attending: Family Medicine | Admitting: Nutrition

## 2024-06-17 ENCOUNTER — Other Ambulatory Visit: Payer: Self-pay | Admitting: Psychiatry

## 2024-06-19 ENCOUNTER — Other Ambulatory Visit (HOSPITAL_COMMUNITY): Payer: Self-pay

## 2024-06-19 ENCOUNTER — Other Ambulatory Visit: Payer: Self-pay

## 2024-06-22 ENCOUNTER — Other Ambulatory Visit: Payer: Self-pay

## 2024-06-22 ENCOUNTER — Other Ambulatory Visit (HOSPITAL_COMMUNITY): Payer: Self-pay

## 2024-06-22 ENCOUNTER — Other Ambulatory Visit (HOSPITAL_BASED_OUTPATIENT_CLINIC_OR_DEPARTMENT_OTHER): Payer: Self-pay

## 2024-07-25 ENCOUNTER — Other Ambulatory Visit: Payer: Self-pay | Admitting: Psychiatry

## 2024-07-27 ENCOUNTER — Ambulatory Visit: Admitting: Cardiology

## 2024-07-28 ENCOUNTER — Other Ambulatory Visit (HOSPITAL_BASED_OUTPATIENT_CLINIC_OR_DEPARTMENT_OTHER): Payer: Self-pay

## 2024-07-29 NOTE — Progress Notes (Signed)
 Virtual Visit via Video Note  I connected with Mary Roman on 08/03/2024 at  8:00 AM EST by a video enabled telemedicine application and verified that I am speaking with the correct person using two identifiers.  Location: Patient: home Provider: office Persons participated in the visit- patient, provider    I discussed the limitations of evaluation and management by telemedicine and the availability of in person appointments. The patient expressed understanding and agreed to proceed.     I discussed the assessment and treatment plan with the patient. The patient was provided an opportunity to ask questions and all were answered. The patient agreed with the plan and demonstrated an understanding of the instructions.   The patient was advised to call back or seek an in-person evaluation if the symptoms worsen or if the condition fails to improve as anticipated.    Katheren Sleet, MD    [facility] MD/PA/NP OP Progress Note  08/03/2024 8:24 AM Mary Roman  MRN:  [id number]  Chief Complaint:  Chief Complaint  Patient presents with   Follow-up   HPI:  This is a follow-up appointment for depression, anxiety and insomnia.  She states that she was stressed during holiday season.  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  However, she was able to gather on Thanksgiving, and was able to celebrate Christmas with her own family.  She reports feeling a little down as it was the first time she did not have her mother on those holidays.  However, she was able to stay connected with her family.  She states that she has some better days than others.  She feels overwhelmed at work.  However, she is able to take a walk for few minutes.  She sleeps 6 hours when she takes Sonata .  She has trouble with initial insomnia when she did not take the medication.  She has occasional increase in appetite; she is trying to bring up her lunch to work.  She takes  hydroxyzine  2-3 times a week for anxiety.  She denies panic attacks.  She denies SI, HI, hallucinations.  She denies alcohol use or drug use.  She agrees with the plans as outlined below.   Wt Readings from Last 3 Encounters:  04/20/24 186 lb (84.4 kg)  03/20/24 188 lb 0.6 oz (85.3 kg)  11/05/23 188 lb 1.3 oz (85.3 kg)     Visit Diagnosis:    ICD-10-CM   1. GAD (generalized anxiety disorder)  F41.1     2. MDD (major depressive disorder), recurrent, in partial remission  F33.41 venlafaxine  XR (EFFEXOR -XR) 150 MG 24 hr capsule    venlafaxine  XR (EFFEXOR -XR) 75 MG 24 hr capsule    3. Insomnia, unspecified type  G47.00 zaleplon  (SONATA ) 10 MG capsule      Past Psychiatric History: Please see initial evaluation for full details. I have reviewed the history. No updates at this time.     Past Medical History:  Past Medical History:  Diagnosis Date   Absolute anemia 06/23/2018   Added automatically from request for surgery [id number]   Anemia    Anxiety    Backache 12/24/2007   Chronic back pain    Depression    Diabetes mellitus, type II (HCC)    Headache(784.0)    Hypertension    Metabolic syndrome X 04/12/2014   Obesity    Palpitation    Sickle cell trait    Thyromegaly    Unspecified  vitamin D  deficiency 02/19/2013    Past Surgical History:  Procedure Laterality Date   COLONOSCOPY WITH PROPOFOL  N/A 01/03/2021   Procedure: COLONOSCOPY WITH PROPOFOL ;  Surgeon: Eartha Angelia Sieving, MD;  Location: [facility];  Service: Gastroenterology;  Laterality: N/A;  AM   DILATATION AND CURETTAGE/HYSTEROSCOPY WITH MINERVA N/A 03/25/2019   Procedure: DILATATION AND CURETTAGE/HYSTEROSCOPY WITH  ATTEMPTED MINERVA ENDOMETRIAL ABLATION;  Surgeon: Jayne Vonn DEL, MD;  Location: [facility] ORS;  Service: Gynecology;  Laterality: N/A;   None      Family Psychiatric History: Please see initial evaluation for full details. I have reviewed the history. No updates at this time.     Family History:   Family History  Problem Relation Age of Onset   Diabetes Mother    Hypertension Mother    Stroke Mother    Colon cancer Mother    Alcohol abuse Mother    Cancer - Colon Mother 58   Pneumonia Father    Depression Sister    Hypertension Sister    Anxiety disorder Sister    Hypertension Sister    Leukemia Sister    Heart attack Maternal Uncle    Hypertension Maternal Grandfather    Diabetes Maternal Grandmother    Hypertension Maternal Grandmother    Depression Child     Social History:  Social History   Socioeconomic History   Marital status: Single    Spouse name: Not on file   Number of children: 2   Years of education: Not on file   Highest education level: Not on file  Occupational History   Occupation: employed in medical office   Tobacco Use   Smoking status: Never   Smokeless tobacco: Never  Vaping Use   Vaping status: Never Used  Substance and Sexual Activity   Alcohol use: No   Drug use: No   Sexual activity: Yes    Partners: Male    Birth control/protection: Other-see comments    Comment: bf had vasectomy  Other Topics Concern   Not on file  Social History Narrative   Not on file   Social Drivers of Health   Tobacco Use: Low Risk (08/03/2024)   Patient History    Smoking Tobacco Use: Never    Smokeless Tobacco Use: Never    Passive Exposure: Not on file  Financial Resource Strain: Low Risk (07/01/2023)   Overall Financial Resource Strain (CARDIA)    Difficulty of Paying Living Expenses: Not very hard  Food Insecurity: No Food Insecurity (07/01/2023)   Hunger Vital Sign    Worried About Running Out of Food in the Last Year: Never true    Ran Out of Food in the Last Year: Never true  Transportation Needs: No Transportation Needs (07/01/2023)   PRAPARE - Administrator, Civil Service (Medical): No    Lack of Transportation (Non-Medical): No  Physical Activity: Inactive (07/01/2023)   Exercise Vital Sign    Days of Exercise per Week: 0  days    Minutes of Exercise per Session: 0 min  Stress: Stress Concern Present (07/01/2023)   [employer] of Occupational Health - Occupational Stress Questionnaire    Feeling of Stress : To some extent  Social Connections: Moderately Integrated (07/01/2023)   Social Connection and Isolation Panel    Frequency of Communication with Friends and Family: Twice a week    Frequency of Social Gatherings with Friends and Family: Once a week    Attends Religious Services: 1 to 4 times per  year    Active Member of Clubs or Organizations: No    Attends Banker Meetings: Never    Marital Status: Living with partner  Depression (PHQ2-9): High Risk (04/20/2024)   Depression (PHQ2-9)    PHQ-2 Score: 11  Alcohol Screen: Low Risk (07/01/2023)   Alcohol Screen    Last Alcohol Screening Score (AUDIT): 0  Housing: Low Risk (07/01/2023)   Housing    Last Housing Risk Score: 0  Utilities: Not At Risk (07/01/2023)   AHC Utilities    Threatened with loss of utilities: No  Health Literacy: Adequate Health Literacy (07/01/2023)   B1300 Health Literacy    Frequency of need for help with medical instructions: Never    Allergies: Allergies[1]  Metabolic Disorder Labs: Lab Results  Component Value Date   HGBA1C 12.0 (H) 03/20/2024   MPG 174.29 04/10/2022   MPG 240.3 08/25/2021   No results found for: PROLACTIN Lab Results  Component Value Date   CHOL 137 11/05/2023   TRIG 93 11/05/2023   HDL 36 (L) 11/05/2023   CHOLHDL 5.3 (H) 06/06/2023   VLDL 11 02/05/2022   LDLCALC 83 11/05/2023   LDLCALC 87 06/06/2023   Lab Results  Component Value Date   TSH 2.140 11/05/2023   TSH 0.924 10/17/2022    Therapeutic Level Labs: No results found for: LITHIUM No results found for: VALPROATE No results found for: CBMZ  Current Medications: Current Outpatient Medications  Medication Sig Dispense Refill   acetaminophen  (TYLENOL ) 500 MG tablet Take 1,000 mg by mouth every 6 (six)  hours as needed (pain). (Patient not taking: Reported on 04/20/2024)     aspirin  EC 81 MG tablet Take 1 tablet (81 mg total) by mouth daily. (Patient not taking: Reported on 04/20/2024) 90 tablet 3   blood glucose meter kit and supplies Dispense based on patient and insurance preference. Once daily testing dx e11.9 1 each 0   Blood Pressure KIT Use to check blood pressure daily. 1 kit 0   [START ON 09/17/2024] Brexpiprazole  (REXULTI ) 0.5 MG TABS Take 1 tablet (0.5 mg total) by mouth daily. Total of 1.5 mg daily. Take along with 1 mg tab 90 tablet 0   [START ON 09/17/2024] brexpiprazole  (REXULTI ) 1 MG TABS tablet Take 1 tablet (1 mg total) by mouth daily. 90 tablet 0   buPROPion  (WELLBUTRIN  XL) 150 MG 24 hr tablet Take 3 tablets (450 mg total) by mouth daily. 270 tablet 1   Continuous Glucose Sensor (FREESTYLE LIBRE 3 PLUS SENSOR) MISC Change sensor every 15 days. 6 each 3   empagliflozin  (JARDIANCE ) 25 MG TABS tablet Take 1 tablet (25 mg total) by mouth daily before breakfast. 90 tablet 3   fluticasone  (FLONASE ) 50 MCG/ACT nasal spray Place 2 sprays into both nostrils daily. (Patient not taking: Reported on 04/20/2024) 16 g 6   glipiZIDE  (GLUCOTROL  XL) 10 MG 24 hr tablet Take 2 tablets (20 mg total) by mouth in the morning with breakfast. 180 tablet 1   [START ON 08/26/2024] hydrOXYzine  (ATARAX ) 25 MG tablet Take 1 tablet (25 mg total) by mouth daily as needed for anxiety. 90 tablet 0   metoprolol  tartrate (LOPRESSOR ) 25 MG tablet Take 1 tablet (25 mg total) by mouth 2 (two) times daily. Please schedule appointment for future refills. Thank you 180 tablet 1   montelukast  (SINGULAIR ) 10 MG tablet Take 1 tablet (10 mg total) by mouth at bedtime. (Patient not taking: Reported on 04/20/2024) 30 tablet 3   olmesartan  (BENICAR ) 20  MG tablet Take 1 tablet (20 mg total) by mouth daily. 90 tablet 1   ondansetron  (ZOFRAN ) 4 MG tablet Take one tablet by mouth once daily as needed, for nausea 36 tablet 1   rosuvastatin   (CRESTOR ) 10 MG tablet Take 1 tablet (10 mg total) by mouth daily. 90 tablet 1   tirzepatide  (MOUNJARO ) 10 MG/0.5ML Pen Inject 10 mg into the skin once a week. 6 mL 0   tirzepatide  (MOUNJARO ) 7.5 MG/0.5ML Pen Inject 7.5 mg into the skin once a week. 6 mL 1   triamterene -hydrochlorothiazide  (MAXZIDE -25) 37.5-25 MG tablet Take 1 tablet by mouth daily. 90 tablet 3   [START ON 08/18/2024] venlafaxine  XR (EFFEXOR -XR) 150 MG 24 hr capsule Take 1 capsule (150 mg total) by mouth daily. 90 capsule 1   [START ON 08/18/2024] venlafaxine  XR (EFFEXOR -XR) 75 MG 24 hr capsule Take 1 capsule (75 mg total) by mouth daily. Total of 225 mg daily. Take along with 150 mg cap 90 capsule 1   Vitamin D , Ergocalciferol , (DRISDOL ) 1.25 MG (50000 UNIT) CAPS capsule Take 1 capsule (50,000 Units total) by mouth every 7 (seven) days. 12 capsule 1   zaleplon  (SONATA ) 10 MG capsule Take 1 capsule (10 mg total) by mouth at bedtime as needed for sleep. 30 capsule 1   [START ON 08/27/2024] zaleplon  (SONATA ) 10 MG capsule Take 1 capsule (10 mg total) by mouth at bedtime as needed for sleep. 30 capsule 1   No current facility-administered medications for this visit.     Musculoskeletal: Strength & Muscle Tone: N/A Gait & Station: N/A Patient leans: N/A  Psychiatric Specialty Exam: Review of Systems  Psychiatric/Behavioral:  Positive for dysphoric mood and sleep disturbance. Negative for agitation, behavioral problems, confusion, decreased concentration, hallucinations, self-injury and suicidal ideas. The patient is nervous/anxious. The patient is not hyperactive.   All other systems reviewed and are negative.   Last menstrual period 01/18/2019.There is no height or weight on file to calculate BMI.  General Appearance: Well Groomed  Eye Contact:  Good  Speech:  Clear and Coherent  Volume:  Normal  Mood:  good  Affect:  Appropriate, Congruent, and calm  Thought Process:  Coherent  Orientation:  Full (Time, Place, and Person)   Thought Content: Logical   Suicidal Thoughts:  No  Homicidal Thoughts:  No  Memory:  Immediate;   Good  Judgement:  Good  Insight:  Good  Psychomotor Activity:  Normal  Concentration:  Concentration: Good and Attention Span: Good  Recall:  Good  Fund of Knowledge: Good  Language: Good  Akathisia:  No  Handed:  Right  AIMS (if indicated): not done  Assets:  Communication Skills Desire for Improvement  ADL's:  Intact  Cognition: WNL  Sleep:  Fair   Screenings: GAD-7    Flowsheet Row Office Visit from 03/20/2024 in [facility] Office Visit from 11/05/2023 in [facility] Office Visit from 07/01/2023 in [facility] Office Visit from 06/06/2023 in [facility] Office Visit from 02/06/2023 in [facility]  Total GAD-7 Score 6 15 4 9 6    PHQ2-9    Flowsheet Row Nutrition from 04/20/2024 in [facility] from 03/20/2024 in [facility] Office Visit from 11/05/2023 in [facility] [facility] Office Visit from 07/01/2023 in [facility]  Visit from 06/06/2023 in [facility]  PHQ-2 Total Score 4 2 4 2  0  PHQ-9 Total Score 11 7 15 6  --   Flowsheet Row Video Visit from 09/14/2021 in [facility] Video Visit from 08/10/2021 in [facility] Video Visit from 05/04/2021 in [facility]  C-SSRS RISK CATEGORY Error: Q3, 4, or 5 should not be populated when Q2 is No No Risk Low Risk     Assessment and Plan:  Mary Roman is a 53 y.o. female with a history of depression, mild OSA, diabetes, who presents for follow up appointment for below.   1. MDD (major depressive disorder),  recurrent, in partial remission 3. GAD (generalized anxiety disorder) She lost her mother in Feb 2025.  Her fianc is unemployed, who has not been able to undergo knee replacement due to diabetes.  She reports conflict with her one of her sister, who lives locally.  History:    Although she reports down mood related to grief of loss of her mother during holiday season, there has been consistent improvement in depressive symptoms and anxiety since the previous visit.  She has been able to handle stress related to work despite feeling overwhelmed.  Will continue current medication regimen.  Will continue venlafaxine  to target depression and anxiety.  Will continue bupropion  and rexulti  as adjunctive treatment for depression.  Will continue hydroxyzine  as needed for anxiety.   2. Insomnia, unspecified type - per chart, she was recommended to try CPAP; rescheduled with cardiologist. Stable.  She has been working on with holding Sonata  when able.  Will continue current dose of Sonata  to target insomnia with hope to taper it off in the future.   # high risk medication use      Last checked  EKG HR 86, QTc465msec 04/2021  Lipid panels LDL 83 10/2023  HbA1c 12.6 H 10/2023      Plan (she will contact the office if she needs a refill)- [place] Continue venlafaxine  225 mg daily  Continue bupropion  450 mg daily  Continue rexulti  1.5 mg daily (uptitrated 03/2024) Continue Sonata  10 mg at night as needed for insomnia- [facility] Continue hydroxyzine  25 mg daily as needed for anxiety- [facility]. She rarely takes this medication Next appointment: 3/2 at 10 am, video - on Mounjaro    Past trials of medication: fluoxetine , duloxetine , Abilify  (weight gain),  buspirone , Trazodone  (weird feeling) temazepam , Ambien , Lunesta , Xanax       The patient demonstrates the following risk factors for suicide: Chronic risk factors for suicide include: psychiatric disorder of depression, anxiety and chronic pain. Acute risk  factors for suicide include: N/A. Protective factors for this patient include: positive social support, responsibility to others (children, family), coping skills and hope for the future. Considering these factors, the overall suicide risk at this point appears to be low. Patient is appropriate for outpatient follow up.  Collaboration of Care: Collaboration of Care: Other reviewed notes in Epic  Patient/Guardian was advised Release of Information must be obtained prior to any record release in order to collaborate their care with an outside provider. Patient/Guardian was advised if they have not already done so to contact the registration department to sign all necessary forms in order for us  to release information regarding their care.   Consent: Patient/Guardian gives verbal consent for treatment and assignment of benefits for services provided during this visit. Patient/Guardian expressed understanding and agreed to proceed.  Katheren Sleet, MD 08/03/2024, 8:24 AM     [1] No Known Allergies

## 2024-08-03 ENCOUNTER — Other Ambulatory Visit: Payer: Self-pay

## 2024-08-03 ENCOUNTER — Encounter: Payer: Self-pay | Admitting: Psychiatry

## 2024-08-03 ENCOUNTER — Other Ambulatory Visit (HOSPITAL_COMMUNITY): Payer: Self-pay

## 2024-08-03 ENCOUNTER — Telehealth (INDEPENDENT_AMBULATORY_CARE_PROVIDER_SITE_OTHER): Admitting: Psychiatry

## 2024-08-03 ENCOUNTER — Other Ambulatory Visit (HOSPITAL_BASED_OUTPATIENT_CLINIC_OR_DEPARTMENT_OTHER): Payer: Self-pay

## 2024-08-03 DIAGNOSIS — G4700 Insomnia, unspecified: Secondary | ICD-10-CM

## 2024-08-03 DIAGNOSIS — F3341 Major depressive disorder, recurrent, in partial remission: Secondary | ICD-10-CM

## 2024-08-03 DIAGNOSIS — F411 Generalized anxiety disorder: Secondary | ICD-10-CM

## 2024-08-03 MED ORDER — VENLAFAXINE HCL ER 150 MG PO CP24: 150.0000 mg | ORAL_CAPSULE | Freq: Every day | ORAL | 1 refills | Status: AC

## 2024-08-03 MED ORDER — BREXPIPRAZOLE 1 MG PO TABS: 1.0000 mg | ORAL_TABLET | Freq: Every day | ORAL | 0 refills | Status: AC

## 2024-08-03 MED ORDER — ZALEPLON 10 MG PO CAPS: 10.0000 mg | ORAL_CAPSULE | Freq: Every evening | ORAL | 1 refills | Status: AC | PRN

## 2024-08-03 MED ORDER — REXULTI 0.5 MG PO TABS: 0.5000 mg | ORAL_TABLET | Freq: Every day | ORAL | 0 refills | Status: AC

## 2024-08-03 MED ORDER — VENLAFAXINE HCL ER 75 MG PO CP24: 75.0000 mg | ORAL_CAPSULE | Freq: Every day | ORAL | 1 refills | Status: AC

## 2024-08-03 MED ORDER — HYDROXYZINE HCL 25 MG PO TABS: 25.0000 mg | ORAL_TABLET | Freq: Every day | ORAL | 0 refills | Status: AC | PRN

## 2024-08-04 ENCOUNTER — Other Ambulatory Visit: Payer: Self-pay

## 2024-08-06 ENCOUNTER — Other Ambulatory Visit: Payer: Self-pay

## 2024-08-07 ENCOUNTER — Ambulatory Visit: Admitting: Family Medicine

## 2024-08-07 ENCOUNTER — Other Ambulatory Visit (HOSPITAL_BASED_OUTPATIENT_CLINIC_OR_DEPARTMENT_OTHER): Payer: Self-pay

## 2024-08-10 ENCOUNTER — Other Ambulatory Visit (HOSPITAL_BASED_OUTPATIENT_CLINIC_OR_DEPARTMENT_OTHER): Payer: Self-pay

## 2024-08-10 ENCOUNTER — Other Ambulatory Visit: Payer: Self-pay | Admitting: Family Medicine

## 2024-08-11 ENCOUNTER — Other Ambulatory Visit (HOSPITAL_BASED_OUTPATIENT_CLINIC_OR_DEPARTMENT_OTHER): Payer: Self-pay

## 2024-08-13 ENCOUNTER — Other Ambulatory Visit (HOSPITAL_COMMUNITY): Payer: Self-pay

## 2024-08-13 ENCOUNTER — Other Ambulatory Visit (HOSPITAL_BASED_OUTPATIENT_CLINIC_OR_DEPARTMENT_OTHER): Payer: Self-pay

## 2024-08-17 ENCOUNTER — Other Ambulatory Visit (HOSPITAL_BASED_OUTPATIENT_CLINIC_OR_DEPARTMENT_OTHER): Payer: Self-pay

## 2024-08-17 MED ORDER — SODIUM FLUORIDE 1.1 % DT CREA
TOPICAL_CREAM | DENTAL | 3 refills | Status: AC
  Filled 2024-08-17: qty 51, 30d supply, fill #0
  Filled ????-??-??: fill #0

## 2024-08-26 ENCOUNTER — Other Ambulatory Visit (HOSPITAL_COMMUNITY): Payer: Self-pay

## 2024-08-26 ENCOUNTER — Other Ambulatory Visit (HOSPITAL_BASED_OUTPATIENT_CLINIC_OR_DEPARTMENT_OTHER): Payer: Self-pay

## 2024-08-26 ENCOUNTER — Telehealth: Payer: Self-pay

## 2024-08-26 ENCOUNTER — Other Ambulatory Visit: Payer: Self-pay | Admitting: Family Medicine

## 2024-08-26 NOTE — Telephone Encounter (Signed)
 Pt needing refill of mounjaro 

## 2024-08-27 ENCOUNTER — Telehealth: Payer: Self-pay

## 2024-08-27 ENCOUNTER — Ambulatory Visit: Payer: Self-pay | Admitting: Family Medicine

## 2024-08-27 ENCOUNTER — Other Ambulatory Visit (HOSPITAL_COMMUNITY): Payer: Self-pay

## 2024-08-27 ENCOUNTER — Other Ambulatory Visit: Payer: Self-pay

## 2024-08-27 ENCOUNTER — Other Ambulatory Visit (HOSPITAL_BASED_OUTPATIENT_CLINIC_OR_DEPARTMENT_OTHER): Payer: Self-pay

## 2024-08-27 ENCOUNTER — Other Ambulatory Visit (HOSPITAL_COMMUNITY)
Admission: RE | Admit: 2024-08-27 | Discharge: 2024-08-27 | Disposition: A | Discharge status: Home / Self Care | Source: Ambulatory Visit | Attending: Family Medicine | Admitting: Family Medicine

## 2024-08-27 DIAGNOSIS — I10 Essential (primary) hypertension: Secondary | ICD-10-CM | POA: Insufficient documentation

## 2024-08-27 DIAGNOSIS — E785 Hyperlipidemia, unspecified: Secondary | ICD-10-CM

## 2024-08-27 DIAGNOSIS — E1159 Type 2 diabetes mellitus with other circulatory complications: Secondary | ICD-10-CM | POA: Diagnosis present

## 2024-08-27 LAB — COMPREHENSIVE METABOLIC PANEL WITH GFR
ALT: 10 U/L (ref 0–44)
AST: 14 U/L — ABNORMAL LOW (ref 15–41)
Albumin: 4.4 g/dL (ref 3.5–5.0)
Alkaline Phosphatase: 73 U/L (ref 38–126)
Anion gap: 15 (ref 5–15)
BUN: 10 mg/dL (ref 6–20)
CO2: 21 mmol/L — ABNORMAL LOW (ref 22–32)
Calcium: 9.9 mg/dL (ref 8.9–10.3)
Chloride: 103 mmol/L (ref 98–111)
Creatinine, Ser: 0.94 mg/dL (ref 0.44–1.00)
GFR, Estimated: >60 mL/min
Glucose, Bld: 372 mg/dL — ABNORMAL HIGH (ref 70–99)
Potassium: 4 mmol/L (ref 3.5–5.1)
Sodium: 138 mmol/L (ref 135–145)
Total Bilirubin: 0.4 mg/dL (ref 0.0–1.2)
Total Protein: 7.6 g/dL (ref 6.5–8.1)

## 2024-08-27 LAB — LIPID PANEL
Cholesterol: 127 mg/dL (ref 0–200)
HDL: 27 mg/dL — ABNORMAL LOW
LDL Cholesterol: 85 mg/dL (ref 0–99)
Total CHOL/HDL Ratio: 4.6 ratio
Triglycerides: 72 mg/dL
VLDL: 14 mg/dL (ref 0–40)

## 2024-08-27 LAB — HEMOGLOBIN A1C
Hgb A1c MFr Bld: 11.5 % — ABNORMAL HIGH (ref 4.8–5.6)
Mean Plasma Glucose: 283.35 mg/dL

## 2024-08-27 NOTE — Telephone Encounter (Signed)
See other mssg

## 2024-08-27 NOTE — Telephone Encounter (Signed)
 Copied from CRM #[id number]. Topic: Clinical - Medication Question >> Aug 27, 2024  8:23 AM Montie POUR wrote: Reason for CRM:  Ms. Ovando is calling to check on the refill of her tirzepatide  (MOUNJARO ) 7.5 MG/0.5ML Pen for 90 day supply. Her insurance will pay for this one with a low copay.  Insurance will not pay for the tirzepatide  (MOUNJARO ) 10 MG/0.5ML Pen for a 90 supply with a low copay.  She only wants to take the 7.5 MG/0.5ML and does not want the 10 MG/0.5 ML. Please send this medication to [facility] at [place], [place]  Chart Shows: 08/26/24 0746 Reorder Epic, User To [phone] AND  Sent to pharmacy as: tirzepatide  (MOUNJARO ) 7.5 MG/0.5ML Pen  Notes to Pharmacy: Dose reduction effective 10/2023   Please check on the order to refill and please send to her pharmacy. She wants a nurse to call her back to confirm the understanding of what she wants. Her number is [phone].

## 2024-08-28 ENCOUNTER — Telehealth: Payer: Self-pay

## 2024-08-28 ENCOUNTER — Other Ambulatory Visit: Payer: Self-pay

## 2024-08-28 ENCOUNTER — Other Ambulatory Visit (HOSPITAL_BASED_OUTPATIENT_CLINIC_OR_DEPARTMENT_OTHER): Payer: Self-pay

## 2024-08-28 DIAGNOSIS — E1159 Type 2 diabetes mellitus with other circulatory complications: Secondary | ICD-10-CM

## 2024-08-28 MED ORDER — TIRZEPATIDE 7.5 MG/0.5ML ~~LOC~~ SOAJ
7.5000 mg | SUBCUTANEOUS | 0 refills | Status: AC
  Filled 2024-08-28: qty 3, 28d supply, fill #0
  Filled 2024-08-31: qty 2, 28d supply, fill #0

## 2024-08-28 NOTE — Telephone Encounter (Signed)
 Copied from CRM #[id number]. Topic: Clinical - Lab/Test Results >> Aug 28, 2024  8:29 AM Darshell M wrote: Reason for CRM: Patient returning call to Abby regarding labs. Patient CB# [phone]

## 2024-08-28 NOTE — Telephone Encounter (Signed)
 Pt agrees to Endo referral but states she has already seen [facility] for nutrition. Refill provided of Mounjaro  per Antonetta. Pt aware they will call with appt

## 2024-08-31 ENCOUNTER — Other Ambulatory Visit (HOSPITAL_COMMUNITY): Payer: Self-pay

## 2024-08-31 ENCOUNTER — Other Ambulatory Visit: Payer: Self-pay

## 2024-08-31 ENCOUNTER — Other Ambulatory Visit (HOSPITAL_BASED_OUTPATIENT_CLINIC_OR_DEPARTMENT_OTHER): Payer: Self-pay

## 2024-09-04 ENCOUNTER — Ambulatory Visit: Admitting: Family Medicine

## 2024-09-28 ENCOUNTER — Ambulatory Visit: Admitting: Family Medicine

## 2024-09-28 ENCOUNTER — Telehealth: Admitting: Psychiatry
